# Patient Record
Sex: Female | Born: 1945
Health system: Southern US, Community
[De-identification: ages and names within clinical notes are randomized; demographics above are authoritative.]

## PROBLEM LIST (undated history)

## (undated) DIAGNOSIS — K589 Irritable bowel syndrome without diarrhea: Secondary | ICD-10-CM

## (undated) DIAGNOSIS — E119 Type 2 diabetes mellitus without complications: Secondary | ICD-10-CM

## (undated) DIAGNOSIS — E785 Hyperlipidemia, unspecified: Secondary | ICD-10-CM

## (undated) DIAGNOSIS — F32A Depression, unspecified: Secondary | ICD-10-CM

## (undated) DIAGNOSIS — C55 Malignant neoplasm of uterus, part unspecified: Secondary | ICD-10-CM

## (undated) DIAGNOSIS — D649 Anemia, unspecified: Secondary | ICD-10-CM

## (undated) DIAGNOSIS — J189 Pneumonia, unspecified organism: Secondary | ICD-10-CM

## (undated) DIAGNOSIS — F419 Anxiety disorder, unspecified: Secondary | ICD-10-CM

## (undated) DIAGNOSIS — D569 Thalassemia, unspecified: Secondary | ICD-10-CM

## (undated) DIAGNOSIS — I10 Essential (primary) hypertension: Secondary | ICD-10-CM

## (undated) HISTORY — PX: BREAST EXCISIONAL BIOPSY: SUR124

## (undated) HISTORY — PX: HERNIA REPAIR: SHX51

---

## 1998-11-17 ENCOUNTER — Other Ambulatory Visit: Admission: RE | Admit: 1998-11-17 | Discharge: 1998-11-17 | Payer: Self-pay | Admitting: Obstetrics and Gynecology

## 1999-10-24 ENCOUNTER — Encounter: Admission: RE | Admit: 1999-10-24 | Discharge: 1999-10-24 | Payer: Self-pay | Admitting: Endocrinology

## 1999-10-24 ENCOUNTER — Encounter: Payer: Self-pay | Admitting: Endocrinology

## 1999-10-26 ENCOUNTER — Encounter: Payer: Self-pay | Admitting: Endocrinology

## 1999-10-26 ENCOUNTER — Encounter: Admission: RE | Admit: 1999-10-26 | Discharge: 1999-10-26 | Payer: Self-pay | Admitting: Endocrinology

## 1999-11-19 ENCOUNTER — Other Ambulatory Visit: Admission: RE | Admit: 1999-11-19 | Discharge: 1999-11-19 | Payer: Self-pay | Admitting: Obstetrics and Gynecology

## 1999-11-20 ENCOUNTER — Encounter (INDEPENDENT_AMBULATORY_CARE_PROVIDER_SITE_OTHER): Payer: Self-pay

## 1999-11-20 ENCOUNTER — Other Ambulatory Visit: Admission: RE | Admit: 1999-11-20 | Discharge: 1999-11-20 | Payer: Self-pay | Admitting: Obstetrics and Gynecology

## 2000-11-11 ENCOUNTER — Encounter: Admission: RE | Admit: 2000-11-11 | Discharge: 2000-11-11 | Payer: Self-pay | Admitting: Endocrinology

## 2000-11-11 ENCOUNTER — Encounter: Payer: Self-pay | Admitting: Endocrinology

## 2000-12-11 ENCOUNTER — Other Ambulatory Visit: Admission: RE | Admit: 2000-12-11 | Discharge: 2000-12-11 | Payer: Self-pay | Admitting: Obstetrics and Gynecology

## 2001-11-12 ENCOUNTER — Encounter: Admission: RE | Admit: 2001-11-12 | Discharge: 2001-11-12 | Payer: Self-pay | Admitting: Endocrinology

## 2001-11-12 ENCOUNTER — Encounter: Payer: Self-pay | Admitting: Endocrinology

## 2001-12-22 ENCOUNTER — Other Ambulatory Visit: Admission: RE | Admit: 2001-12-22 | Discharge: 2001-12-22 | Payer: Self-pay | Admitting: Obstetrics and Gynecology

## 2002-11-16 ENCOUNTER — Encounter: Payer: Self-pay | Admitting: Obstetrics and Gynecology

## 2002-11-16 ENCOUNTER — Encounter: Admission: RE | Admit: 2002-11-16 | Discharge: 2002-11-16 | Payer: Self-pay | Admitting: Obstetrics and Gynecology

## 2003-02-08 ENCOUNTER — Other Ambulatory Visit: Admission: RE | Admit: 2003-02-08 | Discharge: 2003-02-08 | Payer: Self-pay | Admitting: Obstetrics and Gynecology

## 2003-03-28 ENCOUNTER — Ambulatory Visit (HOSPITAL_COMMUNITY): Admission: RE | Admit: 2003-03-28 | Discharge: 2003-03-28 | Payer: Self-pay | Admitting: *Deleted

## 2003-11-24 ENCOUNTER — Ambulatory Visit (HOSPITAL_COMMUNITY): Admission: RE | Admit: 2003-11-24 | Discharge: 2003-11-24 | Payer: Self-pay | Admitting: Obstetrics and Gynecology

## 2004-12-04 ENCOUNTER — Ambulatory Visit (HOSPITAL_COMMUNITY): Admission: RE | Admit: 2004-12-04 | Discharge: 2004-12-04 | Payer: Self-pay | Admitting: Obstetrics and Gynecology

## 2005-12-17 ENCOUNTER — Ambulatory Visit (HOSPITAL_COMMUNITY): Admission: RE | Admit: 2005-12-17 | Discharge: 2005-12-17 | Payer: Self-pay | Admitting: Obstetrics and Gynecology

## 2006-12-23 ENCOUNTER — Ambulatory Visit (HOSPITAL_COMMUNITY): Admission: RE | Admit: 2006-12-23 | Discharge: 2006-12-23 | Payer: Self-pay | Admitting: Endocrinology

## 2007-12-29 ENCOUNTER — Ambulatory Visit (HOSPITAL_COMMUNITY): Admission: RE | Admit: 2007-12-29 | Discharge: 2007-12-29 | Payer: Self-pay | Admitting: Obstetrics and Gynecology

## 2009-01-03 ENCOUNTER — Ambulatory Visit (HOSPITAL_COMMUNITY): Admission: RE | Admit: 2009-01-03 | Discharge: 2009-01-03 | Payer: Self-pay | Admitting: Obstetrics and Gynecology

## 2010-02-01 ENCOUNTER — Ambulatory Visit (HOSPITAL_COMMUNITY): Admission: RE | Admit: 2010-02-01 | Discharge: 2010-02-01 | Payer: Self-pay | Admitting: Obstetrics and Gynecology

## 2010-10-05 NOTE — Op Note (Signed)
   NAME:  Teresa Oliver, Teresa Oliver                         ACCOUNT NO.:  [id number]   MEDICAL RECORD NO.:  [id number]                   PATIENT TYPE:  AMB   LOCATION:  ENDO                                 FACILITY:  [facility]   PHYSICIAN:  Georgiana Spinner, M.D.                 DATE OF BIRTH:  [birth year]   DATE OF PROCEDURE:  03/28/2003  DATE OF DISCHARGE:                                 OPERATIVE REPORT   PROCEDURE:  Colonoscopy.   INDICATIONS:  Colon cancer screening.   ANESTHESIA:  Demerol 60 mg, Versed 8 mg.   DESCRIPTION OF PROCEDURE:  With the patient mildly sedated in the left  lateral decubitus position, the Olympus videoscopic colonoscope was inserted  in the rectum and passed under direct vision to the cecum, identified by  ileocecal valve and appendiceal orifice, both of which were photographed.  From this point the colonoscope was slowly withdrawn, taking circumferential  views of the colonic mucosa, stopping only in the rectum, which appeared  normal on direct and showed hemorrhoids on retroflexed view.  The endoscope  was straightened and withdrawn.  The patient's vital signs and pulse  oximetry remained stable.  The patient tolerated the procedure well without  apparent complications.   FINDINGS:  Internal hemorrhoids, otherwise unremarkable examination.   PLAN:  Consider repeat examination in five to 10 years.                                               Georgiana Spinner, M.D.    GMO/MEDQ  D:  03/28/2003  T:  03/28/2003  Job:  [id number]

## 2011-02-11 ENCOUNTER — Other Ambulatory Visit (HOSPITAL_COMMUNITY): Payer: Self-pay | Admitting: Endocrinology

## 2011-02-11 DIAGNOSIS — Z1231 Encounter for screening mammogram for malignant neoplasm of breast: Secondary | ICD-10-CM

## 2011-02-19 ENCOUNTER — Ambulatory Visit (HOSPITAL_COMMUNITY)
Admission: RE | Admit: 2011-02-19 | Discharge: 2011-02-19 | Disposition: A | Discharge status: Home / Self Care | Payer: 59 | Source: Ambulatory Visit | Attending: Endocrinology | Admitting: Endocrinology

## 2011-02-19 DIAGNOSIS — Z1231 Encounter for screening mammogram for malignant neoplasm of breast: Secondary | ICD-10-CM | POA: Insufficient documentation

## 2011-02-21 ENCOUNTER — Other Ambulatory Visit: Payer: Self-pay | Admitting: Endocrinology

## 2011-02-21 DIAGNOSIS — R928 Other abnormal and inconclusive findings on diagnostic imaging of breast: Secondary | ICD-10-CM

## 2011-02-25 ENCOUNTER — Ambulatory Visit
Admission: RE | Admit: 2011-02-25 | Discharge: 2011-02-25 | Disposition: A | Discharge status: Home / Self Care | Payer: 59 | Source: Ambulatory Visit | Attending: Endocrinology | Admitting: Endocrinology

## 2011-02-25 DIAGNOSIS — R928 Other abnormal and inconclusive findings on diagnostic imaging of breast: Secondary | ICD-10-CM

## 2012-04-20 ENCOUNTER — Other Ambulatory Visit: Payer: Self-pay | Admitting: Obstetrics & Gynecology

## 2012-04-20 DIAGNOSIS — Z1231 Encounter for screening mammogram for malignant neoplasm of breast: Secondary | ICD-10-CM

## 2012-05-25 ENCOUNTER — Ambulatory Visit
Admission: RE | Admit: 2012-05-25 | Discharge: 2012-05-25 | Disposition: A | Discharge status: Home / Self Care | Payer: Medicare Other | Source: Ambulatory Visit | Attending: Obstetrics & Gynecology | Admitting: Obstetrics & Gynecology

## 2012-05-25 DIAGNOSIS — Z1231 Encounter for screening mammogram for malignant neoplasm of breast: Secondary | ICD-10-CM

## 2013-07-12 ENCOUNTER — Other Ambulatory Visit: Payer: Self-pay

## 2013-07-12 DIAGNOSIS — Z1231 Encounter for screening mammogram for malignant neoplasm of breast: Secondary | ICD-10-CM

## 2013-07-26 ENCOUNTER — Ambulatory Visit
Admission: RE | Admit: 2013-07-26 | Discharge: 2013-07-26 | Disposition: A | Discharge status: Home / Self Care | Payer: Medicare Other | Source: Ambulatory Visit

## 2013-07-26 DIAGNOSIS — Z1231 Encounter for screening mammogram for malignant neoplasm of breast: Secondary | ICD-10-CM

## 2014-06-08 DIAGNOSIS — E789 Disorder of lipoprotein metabolism, unspecified: Secondary | ICD-10-CM | POA: Diagnosis not present

## 2014-06-08 DIAGNOSIS — Z79899 Other long term (current) drug therapy: Secondary | ICD-10-CM | POA: Diagnosis not present

## 2014-06-08 DIAGNOSIS — I10 Essential (primary) hypertension: Secondary | ICD-10-CM | POA: Diagnosis not present

## 2014-06-15 DIAGNOSIS — I10 Essential (primary) hypertension: Secondary | ICD-10-CM | POA: Diagnosis not present

## 2014-06-15 DIAGNOSIS — D649 Anemia, unspecified: Secondary | ICD-10-CM | POA: Diagnosis not present

## 2014-06-15 DIAGNOSIS — E789 Disorder of lipoprotein metabolism, unspecified: Secondary | ICD-10-CM | POA: Diagnosis not present

## 2014-06-15 DIAGNOSIS — Z23 Encounter for immunization: Secondary | ICD-10-CM | POA: Diagnosis not present

## 2014-07-21 ENCOUNTER — Other Ambulatory Visit: Payer: Self-pay

## 2014-07-21 DIAGNOSIS — Z1231 Encounter for screening mammogram for malignant neoplasm of breast: Secondary | ICD-10-CM

## 2014-08-05 ENCOUNTER — Ambulatory Visit
Admission: RE | Admit: 2014-08-05 | Discharge: 2014-08-05 | Disposition: A | Discharge status: Home / Self Care | Payer: Medicare Other | Source: Ambulatory Visit

## 2014-08-05 DIAGNOSIS — Z1231 Encounter for screening mammogram for malignant neoplasm of breast: Secondary | ICD-10-CM

## 2014-11-01 DIAGNOSIS — H43813 Vitreous degeneration, bilateral: Secondary | ICD-10-CM | POA: Diagnosis not present

## 2014-11-01 DIAGNOSIS — D3131 Benign neoplasm of right choroid: Secondary | ICD-10-CM | POA: Diagnosis not present

## 2014-11-01 DIAGNOSIS — H35373 Puckering of macula, bilateral: Secondary | ICD-10-CM | POA: Diagnosis not present

## 2014-12-12 DIAGNOSIS — E789 Disorder of lipoprotein metabolism, unspecified: Secondary | ICD-10-CM | POA: Diagnosis not present

## 2014-12-12 DIAGNOSIS — I10 Essential (primary) hypertension: Secondary | ICD-10-CM | POA: Diagnosis not present

## 2014-12-19 DIAGNOSIS — D649 Anemia, unspecified: Secondary | ICD-10-CM | POA: Diagnosis not present

## 2014-12-19 DIAGNOSIS — I10 Essential (primary) hypertension: Secondary | ICD-10-CM | POA: Diagnosis not present

## 2014-12-19 DIAGNOSIS — E789 Disorder of lipoprotein metabolism, unspecified: Secondary | ICD-10-CM | POA: Diagnosis not present

## 2015-03-14 DIAGNOSIS — Z23 Encounter for immunization: Secondary | ICD-10-CM | POA: Diagnosis not present

## 2015-05-10 DIAGNOSIS — H35373 Puckering of macula, bilateral: Secondary | ICD-10-CM | POA: Diagnosis not present

## 2015-05-10 DIAGNOSIS — H43813 Vitreous degeneration, bilateral: Secondary | ICD-10-CM | POA: Diagnosis not present

## 2015-05-10 DIAGNOSIS — D3131 Benign neoplasm of right choroid: Secondary | ICD-10-CM | POA: Diagnosis not present

## 2015-06-12 DIAGNOSIS — I10 Essential (primary) hypertension: Secondary | ICD-10-CM | POA: Diagnosis not present

## 2015-06-12 DIAGNOSIS — E789 Disorder of lipoprotein metabolism, unspecified: Secondary | ICD-10-CM | POA: Diagnosis not present

## 2015-06-20 DIAGNOSIS — E789 Disorder of lipoprotein metabolism, unspecified: Secondary | ICD-10-CM | POA: Diagnosis not present

## 2015-06-20 DIAGNOSIS — I10 Essential (primary) hypertension: Secondary | ICD-10-CM | POA: Diagnosis not present

## 2015-06-20 DIAGNOSIS — D649 Anemia, unspecified: Secondary | ICD-10-CM | POA: Diagnosis not present

## 2015-08-16 ENCOUNTER — Other Ambulatory Visit: Payer: Self-pay

## 2015-08-16 DIAGNOSIS — Z1231 Encounter for screening mammogram for malignant neoplasm of breast: Secondary | ICD-10-CM

## 2015-08-29 ENCOUNTER — Ambulatory Visit
Admission: RE | Admit: 2015-08-29 | Discharge: 2015-08-29 | Disposition: A | Discharge status: Home / Self Care | Payer: Medicare Other | Source: Ambulatory Visit

## 2015-08-29 DIAGNOSIS — Z1231 Encounter for screening mammogram for malignant neoplasm of breast: Secondary | ICD-10-CM | POA: Diagnosis not present

## 2015-11-01 DIAGNOSIS — Z01419 Encounter for gynecological examination (general) (routine) without abnormal findings: Secondary | ICD-10-CM | POA: Diagnosis not present

## 2016-05-07 DIAGNOSIS — Z23 Encounter for immunization: Secondary | ICD-10-CM | POA: Diagnosis not present

## 2016-05-08 DIAGNOSIS — H35363 Drusen (degenerative) of macula, bilateral: Secondary | ICD-10-CM | POA: Diagnosis not present

## 2016-05-08 DIAGNOSIS — H35373 Puckering of macula, bilateral: Secondary | ICD-10-CM | POA: Diagnosis not present

## 2016-05-08 DIAGNOSIS — H43813 Vitreous degeneration, bilateral: Secondary | ICD-10-CM | POA: Diagnosis not present

## 2016-05-08 DIAGNOSIS — D3131 Benign neoplasm of right choroid: Secondary | ICD-10-CM | POA: Diagnosis not present

## 2016-06-11 DIAGNOSIS — I10 Essential (primary) hypertension: Secondary | ICD-10-CM | POA: Diagnosis not present

## 2016-06-11 DIAGNOSIS — E789 Disorder of lipoprotein metabolism, unspecified: Secondary | ICD-10-CM | POA: Diagnosis not present

## 2016-06-18 DIAGNOSIS — G4700 Insomnia, unspecified: Secondary | ICD-10-CM | POA: Diagnosis not present

## 2016-06-18 DIAGNOSIS — E032 Hypothyroidism due to medicaments and other exogenous substances: Secondary | ICD-10-CM | POA: Diagnosis not present

## 2016-06-18 DIAGNOSIS — E789 Disorder of lipoprotein metabolism, unspecified: Secondary | ICD-10-CM | POA: Diagnosis not present

## 2016-06-18 DIAGNOSIS — Z23 Encounter for immunization: Secondary | ICD-10-CM | POA: Diagnosis not present

## 2016-07-03 DIAGNOSIS — Z1211 Encounter for screening for malignant neoplasm of colon: Secondary | ICD-10-CM | POA: Diagnosis not present

## 2016-07-03 DIAGNOSIS — Z1212 Encounter for screening for malignant neoplasm of rectum: Secondary | ICD-10-CM | POA: Diagnosis not present

## 2016-10-10 ENCOUNTER — Other Ambulatory Visit: Payer: Self-pay | Admitting: Endocrinology

## 2016-10-10 DIAGNOSIS — Z1231 Encounter for screening mammogram for malignant neoplasm of breast: Secondary | ICD-10-CM

## 2016-10-24 ENCOUNTER — Ambulatory Visit
Admission: RE | Admit: 2016-10-24 | Discharge: 2016-10-24 | Disposition: A | Discharge status: Home / Self Care | Payer: Medicare Other | Source: Ambulatory Visit | Attending: Endocrinology | Admitting: Endocrinology

## 2016-10-24 DIAGNOSIS — Z1231 Encounter for screening mammogram for malignant neoplasm of breast: Secondary | ICD-10-CM

## 2016-12-11 DIAGNOSIS — Z01419 Encounter for gynecological examination (general) (routine) without abnormal findings: Secondary | ICD-10-CM | POA: Diagnosis not present

## 2017-01-17 DIAGNOSIS — E785 Hyperlipidemia, unspecified: Secondary | ICD-10-CM | POA: Diagnosis not present

## 2017-01-17 DIAGNOSIS — I10 Essential (primary) hypertension: Secondary | ICD-10-CM | POA: Diagnosis not present

## 2017-03-11 DIAGNOSIS — Z23 Encounter for immunization: Secondary | ICD-10-CM | POA: Diagnosis not present

## 2017-06-13 DIAGNOSIS — H35373 Puckering of macula, bilateral: Secondary | ICD-10-CM | POA: Diagnosis not present

## 2017-06-13 DIAGNOSIS — H35363 Drusen (degenerative) of macula, bilateral: Secondary | ICD-10-CM | POA: Diagnosis not present

## 2017-06-13 DIAGNOSIS — H43813 Vitreous degeneration, bilateral: Secondary | ICD-10-CM | POA: Diagnosis not present

## 2017-06-13 DIAGNOSIS — D3131 Benign neoplasm of right choroid: Secondary | ICD-10-CM | POA: Diagnosis not present

## 2017-06-23 DIAGNOSIS — E559 Vitamin D deficiency, unspecified: Secondary | ICD-10-CM | POA: Diagnosis not present

## 2017-06-23 DIAGNOSIS — Z0000 Encounter for general adult medical examination without abnormal findings: Secondary | ICD-10-CM | POA: Diagnosis not present

## 2017-06-23 DIAGNOSIS — Z79899 Other long term (current) drug therapy: Secondary | ICD-10-CM | POA: Diagnosis not present

## 2017-06-30 DIAGNOSIS — I10 Essential (primary) hypertension: Secondary | ICD-10-CM | POA: Diagnosis not present

## 2017-06-30 DIAGNOSIS — E559 Vitamin D deficiency, unspecified: Secondary | ICD-10-CM | POA: Diagnosis not present

## 2017-06-30 DIAGNOSIS — Z23 Encounter for immunization: Secondary | ICD-10-CM | POA: Diagnosis not present

## 2017-06-30 DIAGNOSIS — Z0000 Encounter for general adult medical examination without abnormal findings: Secondary | ICD-10-CM | POA: Diagnosis not present

## 2017-06-30 DIAGNOSIS — R3129 Other microscopic hematuria: Secondary | ICD-10-CM | POA: Diagnosis not present

## 2017-07-21 DIAGNOSIS — R3121 Asymptomatic microscopic hematuria: Secondary | ICD-10-CM | POA: Diagnosis not present

## 2017-07-29 DIAGNOSIS — H2513 Age-related nuclear cataract, bilateral: Secondary | ICD-10-CM | POA: Diagnosis not present

## 2017-07-29 DIAGNOSIS — H52203 Unspecified astigmatism, bilateral: Secondary | ICD-10-CM | POA: Diagnosis not present

## 2017-07-29 DIAGNOSIS — H04123 Dry eye syndrome of bilateral lacrimal glands: Secondary | ICD-10-CM | POA: Diagnosis not present

## 2017-10-22 ENCOUNTER — Other Ambulatory Visit: Payer: Self-pay | Admitting: Internal Medicine

## 2017-10-22 DIAGNOSIS — Z1231 Encounter for screening mammogram for malignant neoplasm of breast: Secondary | ICD-10-CM

## 2017-11-11 ENCOUNTER — Ambulatory Visit
Admission: RE | Admit: 2017-11-11 | Discharge: 2017-11-11 | Disposition: A | Discharge status: Home / Self Care | Payer: Medicare Other | Source: Ambulatory Visit | Attending: Internal Medicine | Admitting: Internal Medicine

## 2017-11-11 DIAGNOSIS — Z1231 Encounter for screening mammogram for malignant neoplasm of breast: Secondary | ICD-10-CM

## 2017-12-22 DIAGNOSIS — I10 Essential (primary) hypertension: Secondary | ICD-10-CM | POA: Diagnosis not present

## 2017-12-22 DIAGNOSIS — E559 Vitamin D deficiency, unspecified: Secondary | ICD-10-CM | POA: Diagnosis not present

## 2018-01-07 DIAGNOSIS — I10 Essential (primary) hypertension: Secondary | ICD-10-CM | POA: Diagnosis not present

## 2018-01-07 DIAGNOSIS — E785 Hyperlipidemia, unspecified: Secondary | ICD-10-CM | POA: Diagnosis not present

## 2018-01-07 DIAGNOSIS — E559 Vitamin D deficiency, unspecified: Secondary | ICD-10-CM | POA: Diagnosis not present

## 2018-06-12 DIAGNOSIS — H35363 Drusen (degenerative) of macula, bilateral: Secondary | ICD-10-CM | POA: Diagnosis not present

## 2018-06-12 DIAGNOSIS — D3131 Benign neoplasm of right choroid: Secondary | ICD-10-CM | POA: Diagnosis not present

## 2018-06-12 DIAGNOSIS — H35373 Puckering of macula, bilateral: Secondary | ICD-10-CM | POA: Diagnosis not present

## 2018-06-12 DIAGNOSIS — H43813 Vitreous degeneration, bilateral: Secondary | ICD-10-CM | POA: Diagnosis not present

## 2018-06-25 DIAGNOSIS — E789 Disorder of lipoprotein metabolism, unspecified: Secondary | ICD-10-CM | POA: Diagnosis not present

## 2018-06-25 DIAGNOSIS — I10 Essential (primary) hypertension: Secondary | ICD-10-CM | POA: Diagnosis not present

## 2018-06-25 DIAGNOSIS — E559 Vitamin D deficiency, unspecified: Secondary | ICD-10-CM | POA: Diagnosis not present

## 2018-07-02 DIAGNOSIS — R3129 Other microscopic hematuria: Secondary | ICD-10-CM | POA: Diagnosis not present

## 2018-07-02 DIAGNOSIS — Z0000 Encounter for general adult medical examination without abnormal findings: Secondary | ICD-10-CM | POA: Diagnosis not present

## 2018-07-02 DIAGNOSIS — I10 Essential (primary) hypertension: Secondary | ICD-10-CM | POA: Diagnosis not present

## 2018-07-02 DIAGNOSIS — E785 Hyperlipidemia, unspecified: Secondary | ICD-10-CM | POA: Diagnosis not present

## 2018-07-31 DIAGNOSIS — H2513 Age-related nuclear cataract, bilateral: Secondary | ICD-10-CM | POA: Diagnosis not present

## 2018-07-31 DIAGNOSIS — H524 Presbyopia: Secondary | ICD-10-CM | POA: Diagnosis not present

## 2018-07-31 DIAGNOSIS — H25013 Cortical age-related cataract, bilateral: Secondary | ICD-10-CM | POA: Diagnosis not present

## 2018-12-11 ENCOUNTER — Other Ambulatory Visit: Payer: Self-pay | Admitting: Internal Medicine

## 2018-12-11 DIAGNOSIS — Z1231 Encounter for screening mammogram for malignant neoplasm of breast: Secondary | ICD-10-CM

## 2018-12-24 DIAGNOSIS — E789 Disorder of lipoprotein metabolism, unspecified: Secondary | ICD-10-CM | POA: Diagnosis not present

## 2018-12-24 DIAGNOSIS — E785 Hyperlipidemia, unspecified: Secondary | ICD-10-CM | POA: Diagnosis not present

## 2018-12-24 DIAGNOSIS — I10 Essential (primary) hypertension: Secondary | ICD-10-CM | POA: Diagnosis not present

## 2018-12-31 DIAGNOSIS — I10 Essential (primary) hypertension: Secondary | ICD-10-CM | POA: Diagnosis not present

## 2018-12-31 DIAGNOSIS — E559 Vitamin D deficiency, unspecified: Secondary | ICD-10-CM | POA: Diagnosis not present

## 2018-12-31 DIAGNOSIS — E785 Hyperlipidemia, unspecified: Secondary | ICD-10-CM | POA: Diagnosis not present

## 2018-12-31 DIAGNOSIS — Z7189 Other specified counseling: Secondary | ICD-10-CM | POA: Diagnosis not present

## 2019-01-27 ENCOUNTER — Other Ambulatory Visit: Payer: Self-pay

## 2019-01-27 ENCOUNTER — Ambulatory Visit
Admission: RE | Admit: 2019-01-27 | Discharge: 2019-01-27 | Disposition: A | Discharge status: Home / Self Care | Payer: Medicare Other | Source: Ambulatory Visit | Attending: Internal Medicine | Admitting: Internal Medicine

## 2019-01-27 DIAGNOSIS — Z1231 Encounter for screening mammogram for malignant neoplasm of breast: Secondary | ICD-10-CM | POA: Diagnosis not present

## 2019-01-27 IMAGING — MG MM DIGITAL SCREENING BILAT W/ TOMO W/ CAD
6 of 10 series · 6 of 30 positions shown · non-contrast
Comparison: Previous exam(s).

CLINICAL DATA: Screening.

EXAM:
DIGITAL SCREENING BILATERAL MAMMOGRAM WITH TOMO AND CAD

[L CC synth-2D]
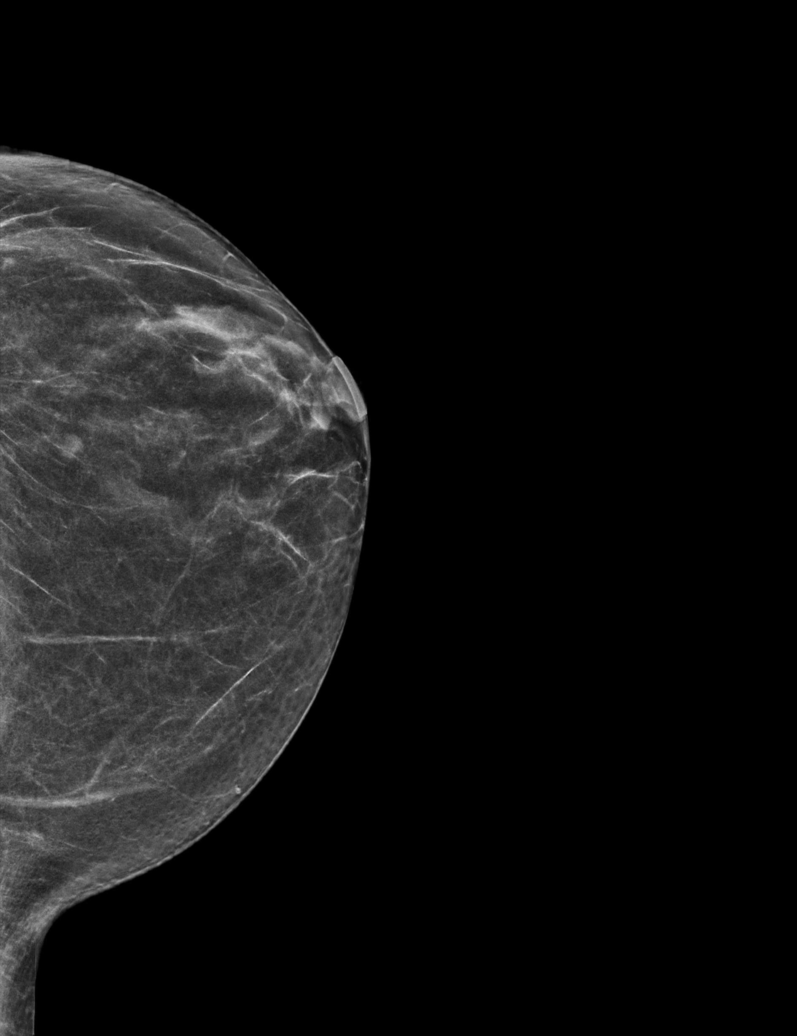

[L MLO synth-2D (1 of 2)]
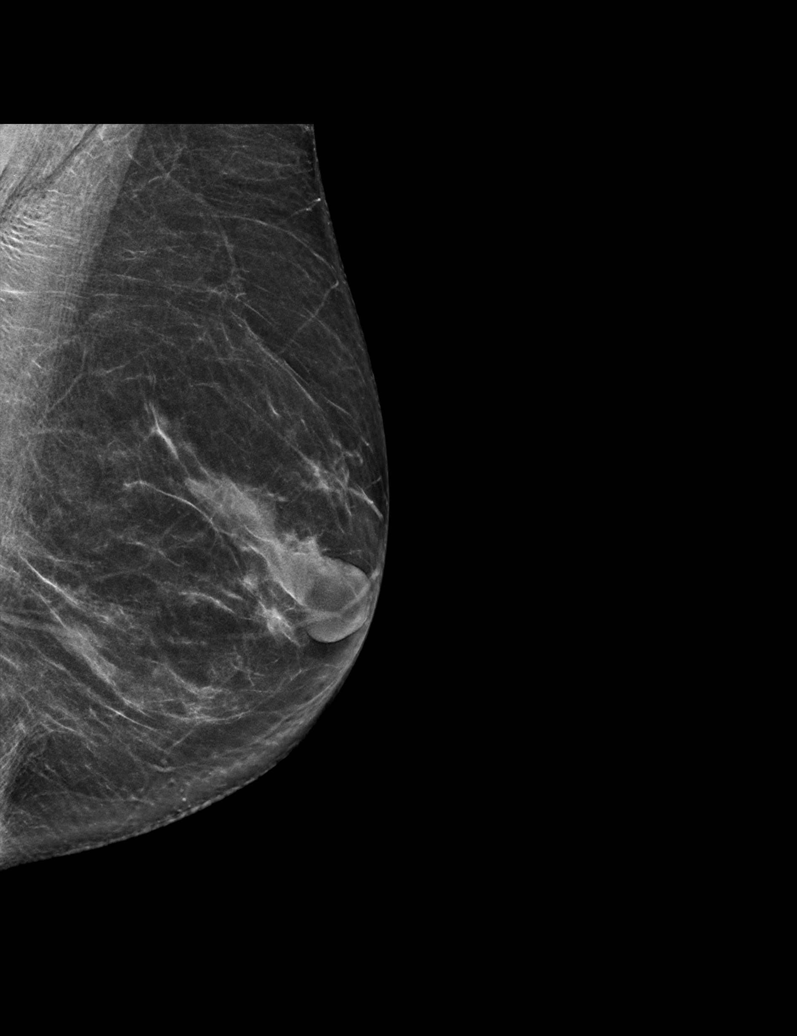

[R MLO synth-2D]
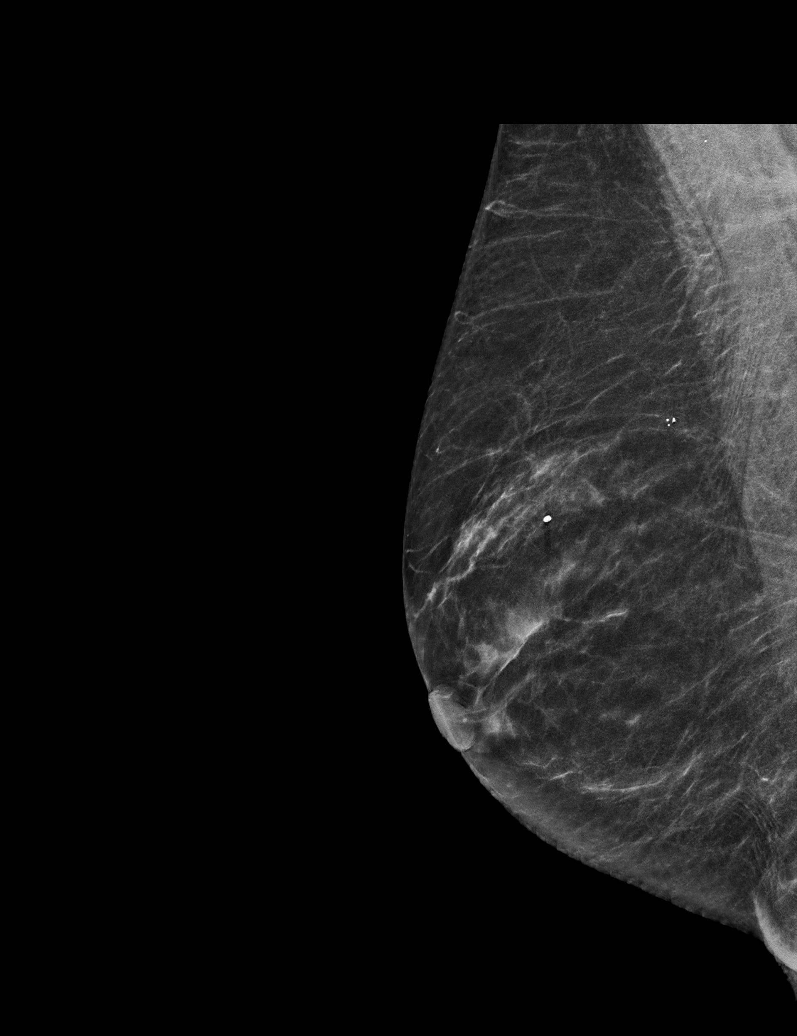

[R CC synth-2D]
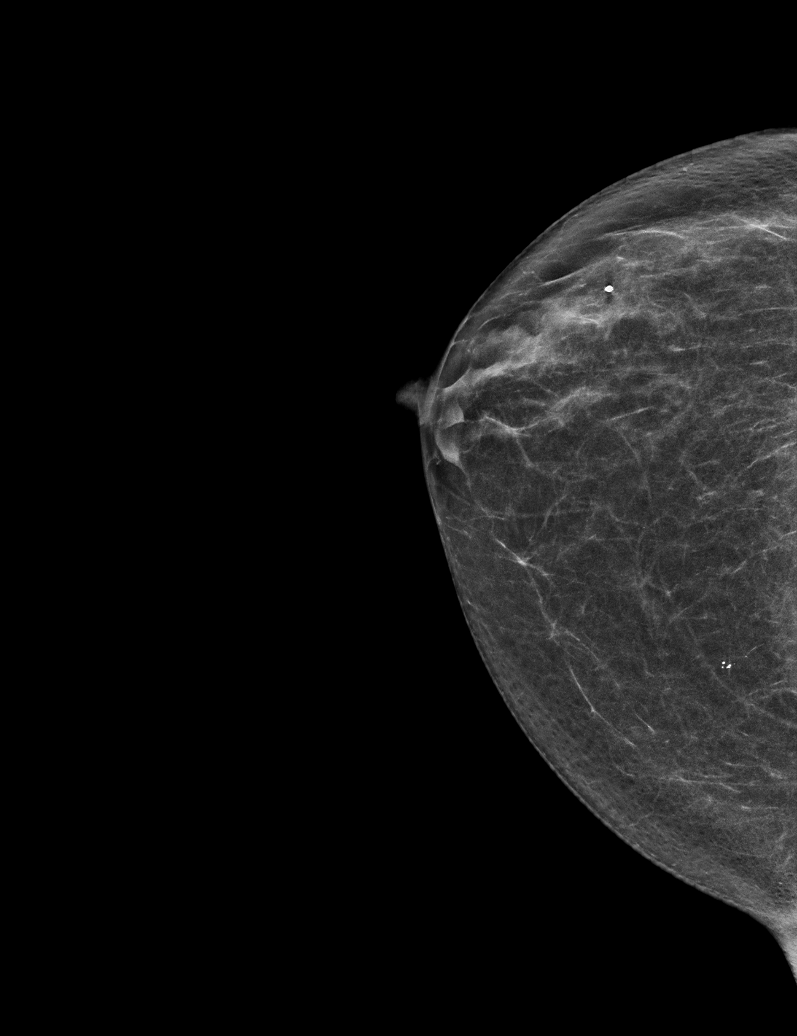

[L MLO synth-2D (2 of 2)]
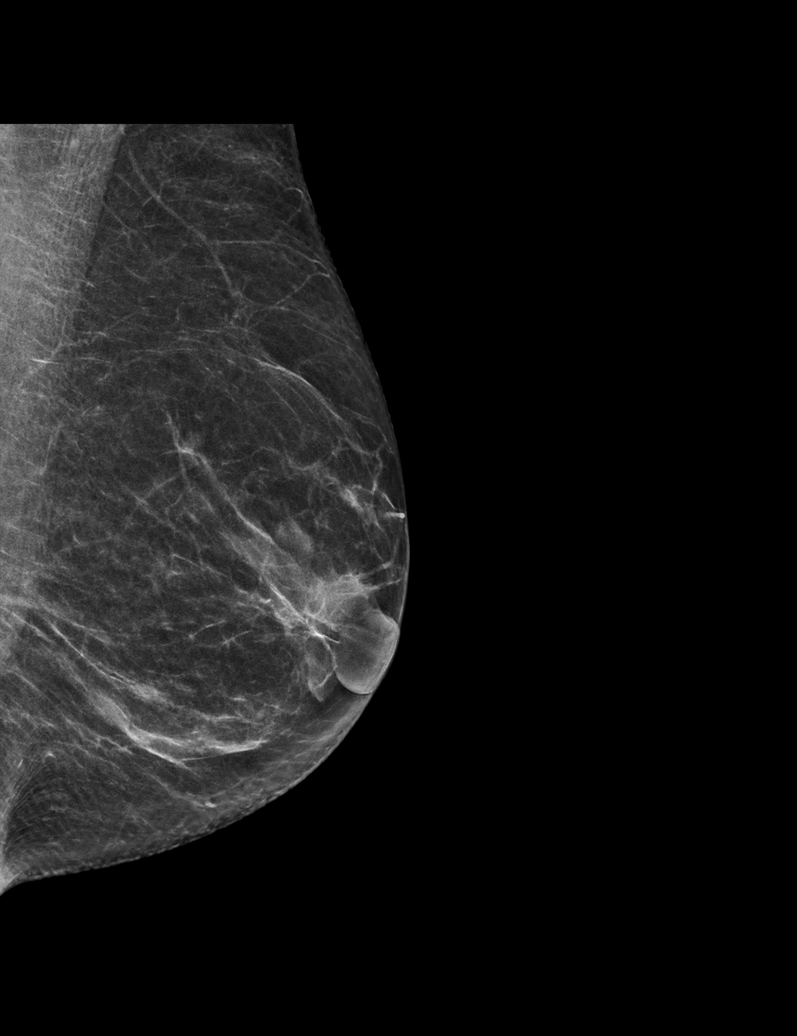

[L CC tomo · tomo slice 29/58.0]
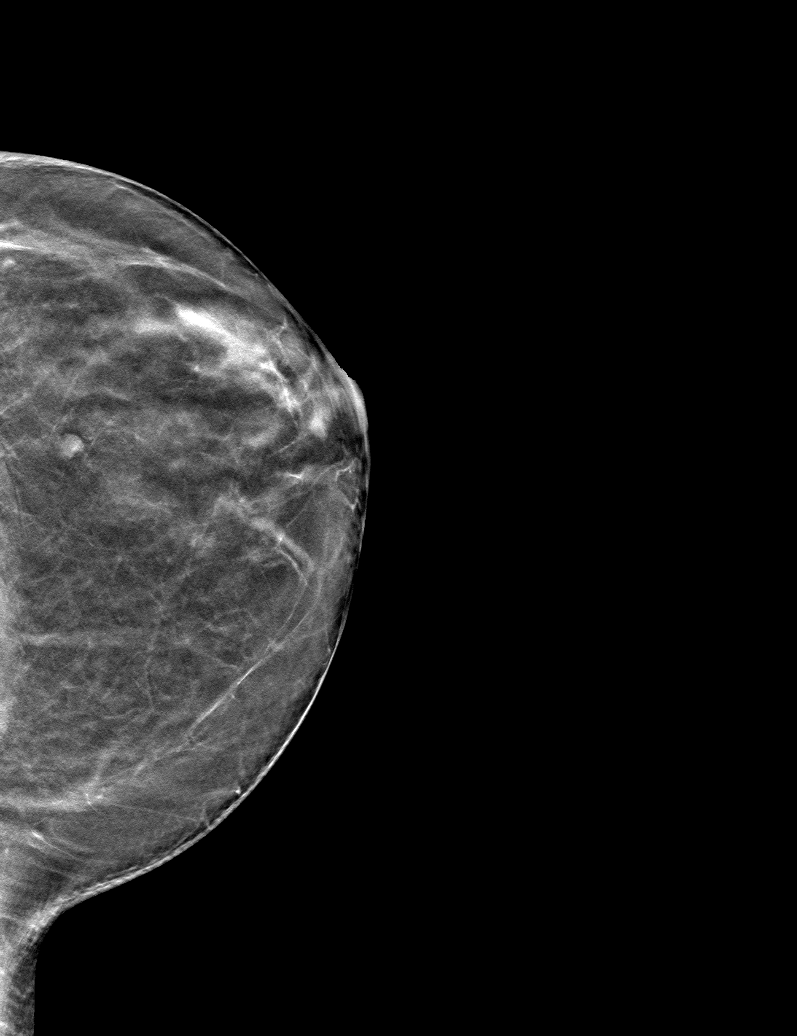

[6 of 30 positions shown; findings below may reference images not displayed]

ACR Breast Density Category b: There are scattered areas of
fibroglandular density.
FINDINGS: There are no findings suspicious for malignancy. Images were
processed with CAD.
IMPRESSION: No mammographic evidence of malignancy. A result letter of this
screening mammogram will be mailed directly to the patient.

RECOMMENDATION:
Screening mammogram in one year. (Code:[TQ])

BI-RADS CATEGORY  1: Negative.

## 2019-04-27 DIAGNOSIS — I10 Essential (primary) hypertension: Secondary | ICD-10-CM | POA: Diagnosis not present

## 2019-06-18 DIAGNOSIS — H35373 Puckering of macula, bilateral: Secondary | ICD-10-CM | POA: Diagnosis not present

## 2019-06-18 DIAGNOSIS — H43813 Vitreous degeneration, bilateral: Secondary | ICD-10-CM | POA: Diagnosis not present

## 2019-06-18 DIAGNOSIS — D3131 Benign neoplasm of right choroid: Secondary | ICD-10-CM | POA: Diagnosis not present

## 2019-06-18 DIAGNOSIS — H35363 Drusen (degenerative) of macula, bilateral: Secondary | ICD-10-CM | POA: Diagnosis not present

## 2019-06-19 ENCOUNTER — Ambulatory Visit: Payer: Medicare Other

## 2019-06-24 DIAGNOSIS — E559 Vitamin D deficiency, unspecified: Secondary | ICD-10-CM | POA: Diagnosis not present

## 2019-06-24 DIAGNOSIS — I10 Essential (primary) hypertension: Secondary | ICD-10-CM | POA: Diagnosis not present

## 2019-06-24 DIAGNOSIS — E785 Hyperlipidemia, unspecified: Secondary | ICD-10-CM | POA: Diagnosis not present

## 2019-06-25 ENCOUNTER — Ambulatory Visit: Payer: Medicare Other | Attending: Internal Medicine

## 2019-06-25 DIAGNOSIS — Z23 Encounter for immunization: Secondary | ICD-10-CM | POA: Insufficient documentation

## 2019-06-25 NOTE — Progress Notes (Signed)
   Covid-19 Vaccination Clinic  Name:  Teresa Oliver    MRN: [id number] DOB: 1945/06/13  06/25/2019  Teresa Oliver was observed post Covid-19 immunization for 15 minutes without incidence. She was provided with Vaccine Information Sheet and instruction to access the V-Safe system.   Teresa Oliver was instructed to call 911 with any severe reactions post vaccine: Marland Kitchen Difficulty breathing  . Swelling of your face and throat  . A fast heartbeat  . A bad rash all over your body  . Dizziness and weakness    Immunizations Administered    Name Date Dose VIS Date Route   Pfizer COVID-19 Vaccine 06/25/2019  8:47 AM 0.3 mL 04/30/2019 Intramuscular   Manufacturer: [place]   Lot: [id number]   [place]: [id number]

## 2019-06-30 ENCOUNTER — Ambulatory Visit: Payer: Medicare Other

## 2019-07-02 DIAGNOSIS — R3129 Other microscopic hematuria: Secondary | ICD-10-CM | POA: Diagnosis not present

## 2019-07-02 DIAGNOSIS — E785 Hyperlipidemia, unspecified: Secondary | ICD-10-CM | POA: Diagnosis not present

## 2019-07-02 DIAGNOSIS — I10 Essential (primary) hypertension: Secondary | ICD-10-CM | POA: Diagnosis not present

## 2019-07-19 ENCOUNTER — Ambulatory Visit: Payer: Medicare Other | Attending: Internal Medicine

## 2019-07-19 DIAGNOSIS — Z23 Encounter for immunization: Secondary | ICD-10-CM | POA: Insufficient documentation

## 2019-07-19 NOTE — Progress Notes (Signed)
   Covid-19 Vaccination Clinic  Name:  Teresa Oliver    MRN: [id number] DOB: 22-Sep-1945  07/19/2019  [name] was observed post Covid-19 immunization for 15 minutes without incidence. She was provided with Vaccine Information Sheet and instruction to access the V-Safe system.   [name] was instructed to call 911 with any severe reactions post vaccine: Marland Kitchen Difficulty breathing  . Swelling of your face and throat  . A fast heartbeat  . A bad rash all over your body  . Dizziness and weakness    Immunizations Administered    Name Date Dose VIS Date Route   Pfizer COVID-19 Vaccine 07/19/2019  5:07 PM 0.3 mL 04/30/2019 Intramuscular   Manufacturer: [place]   Lot: [id number]   [place]: [id number]

## 2019-07-20 ENCOUNTER — Ambulatory Visit: Payer: Medicare Other

## 2019-08-03 DIAGNOSIS — H43813 Vitreous degeneration, bilateral: Secondary | ICD-10-CM | POA: Diagnosis not present

## 2019-08-03 DIAGNOSIS — H524 Presbyopia: Secondary | ICD-10-CM | POA: Diagnosis not present

## 2019-08-03 DIAGNOSIS — H2512 Age-related nuclear cataract, left eye: Secondary | ICD-10-CM | POA: Diagnosis not present

## 2019-08-03 DIAGNOSIS — H25013 Cortical age-related cataract, bilateral: Secondary | ICD-10-CM | POA: Diagnosis not present

## 2019-09-30 DIAGNOSIS — H2512 Age-related nuclear cataract, left eye: Secondary | ICD-10-CM | POA: Diagnosis not present

## 2019-09-30 DIAGNOSIS — H25012 Cortical age-related cataract, left eye: Secondary | ICD-10-CM | POA: Diagnosis not present

## 2019-09-30 DIAGNOSIS — H25812 Combined forms of age-related cataract, left eye: Secondary | ICD-10-CM | POA: Diagnosis not present

## 2019-10-14 DIAGNOSIS — H2511 Age-related nuclear cataract, right eye: Secondary | ICD-10-CM | POA: Diagnosis not present

## 2019-10-14 DIAGNOSIS — H25811 Combined forms of age-related cataract, right eye: Secondary | ICD-10-CM | POA: Diagnosis not present

## 2019-10-14 DIAGNOSIS — H25011 Cortical age-related cataract, right eye: Secondary | ICD-10-CM | POA: Diagnosis not present

## 2019-12-23 DIAGNOSIS — E559 Vitamin D deficiency, unspecified: Secondary | ICD-10-CM | POA: Diagnosis not present

## 2019-12-23 DIAGNOSIS — E785 Hyperlipidemia, unspecified: Secondary | ICD-10-CM | POA: Diagnosis not present

## 2019-12-23 DIAGNOSIS — I10 Essential (primary) hypertension: Secondary | ICD-10-CM | POA: Diagnosis not present

## 2019-12-30 DIAGNOSIS — E785 Hyperlipidemia, unspecified: Secondary | ICD-10-CM | POA: Diagnosis not present

## 2019-12-30 DIAGNOSIS — K5900 Constipation, unspecified: Secondary | ICD-10-CM | POA: Diagnosis not present

## 2019-12-30 DIAGNOSIS — E559 Vitamin D deficiency, unspecified: Secondary | ICD-10-CM | POA: Diagnosis not present

## 2019-12-30 DIAGNOSIS — I10 Essential (primary) hypertension: Secondary | ICD-10-CM | POA: Diagnosis not present

## 2020-03-14 ENCOUNTER — Other Ambulatory Visit: Payer: Self-pay | Admitting: Internal Medicine

## 2020-03-14 DIAGNOSIS — Z1231 Encounter for screening mammogram for malignant neoplasm of breast: Secondary | ICD-10-CM

## 2020-04-20 ENCOUNTER — Other Ambulatory Visit: Payer: Self-pay

## 2020-04-20 ENCOUNTER — Ambulatory Visit
Admission: RE | Admit: 2020-04-20 | Discharge: 2020-04-20 | Disposition: A | Discharge status: Home / Self Care | Payer: Medicare Other | Source: Ambulatory Visit | Attending: Internal Medicine | Admitting: Internal Medicine

## 2020-04-20 DIAGNOSIS — Z1231 Encounter for screening mammogram for malignant neoplasm of breast: Secondary | ICD-10-CM

## 2020-04-20 IMAGING — MG DIGITAL SCREENING BILAT W/ TOMO W/ CAD
6 of 12 series · 6 of 36 positions shown · non-contrast
Comparison: Previous exam(s).

CLINICAL DATA: Screening.

EXAM:
DIGITAL SCREENING BILATERAL MAMMOGRAM WITH TOMO AND CAD

[R CC synth-2D]
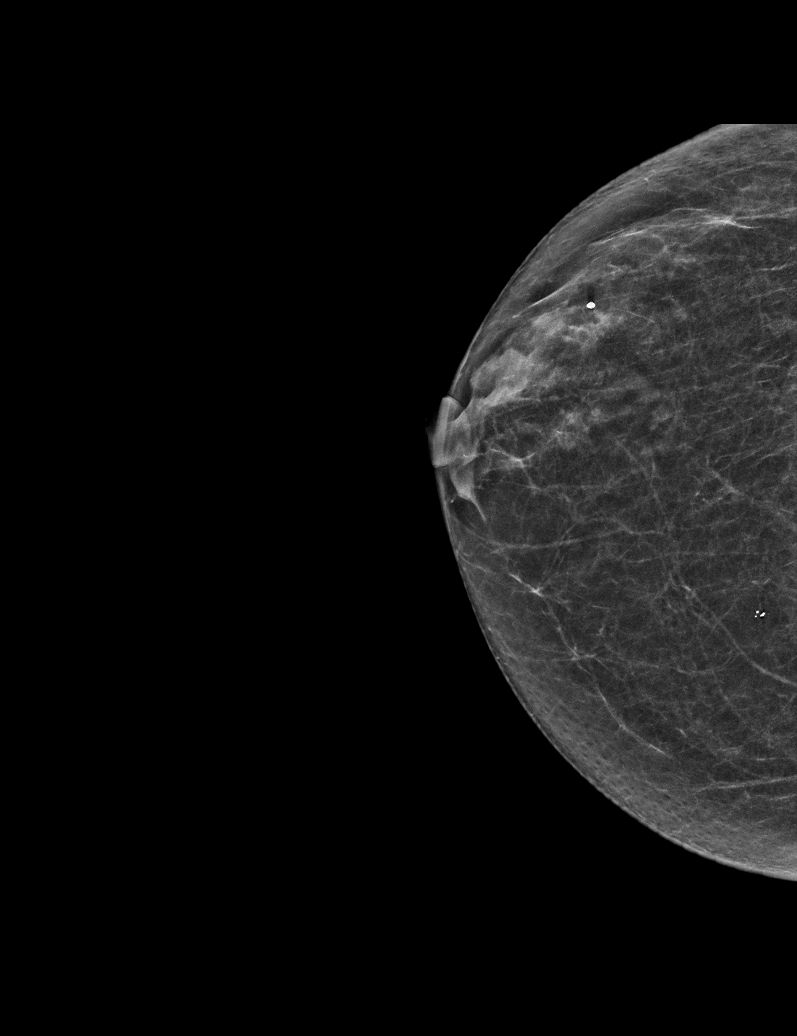

[L MLO synth-2D (1 of 2)]
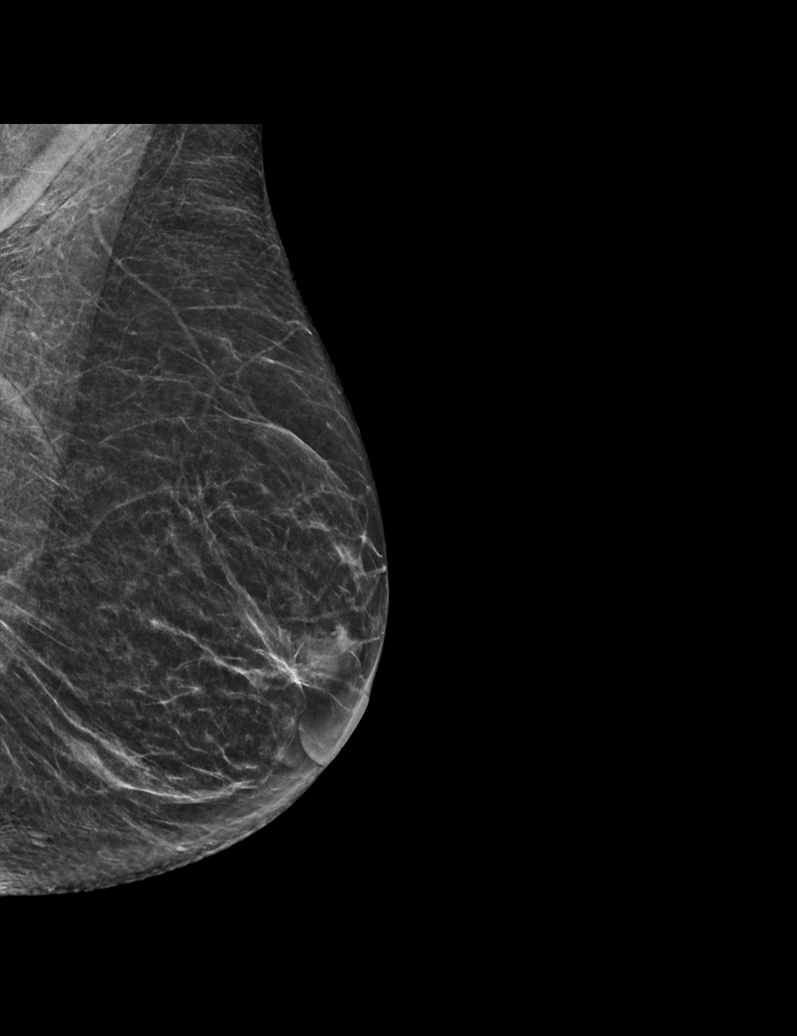

[R MLO synth-2D]
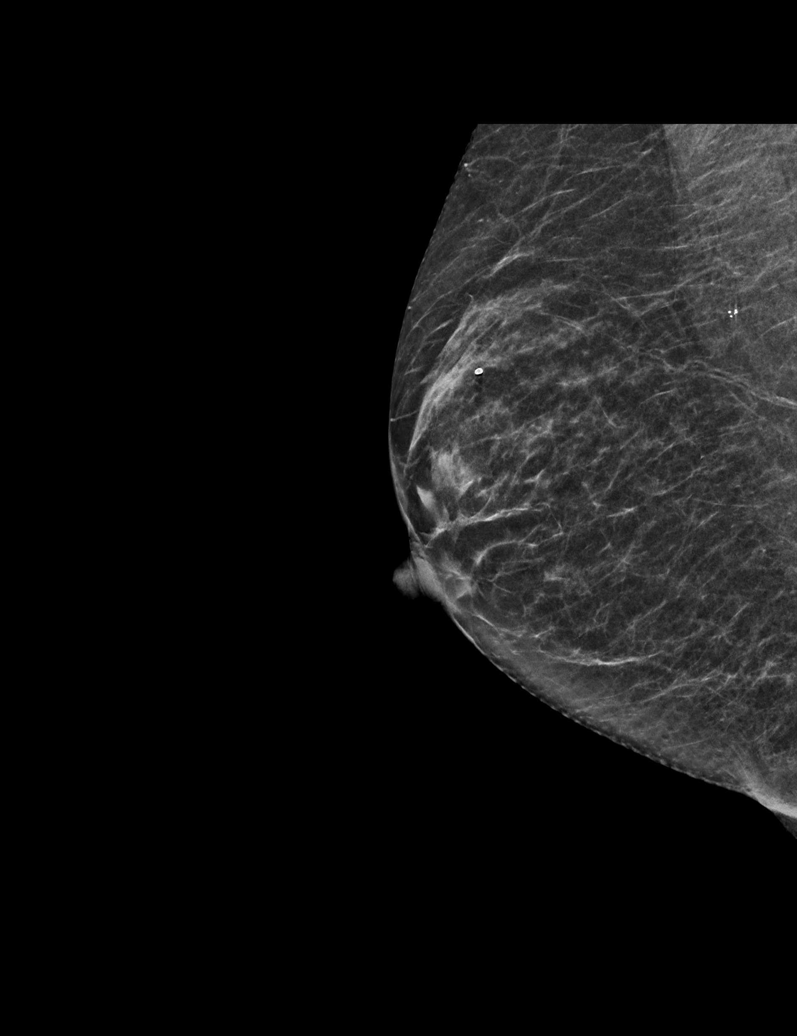

[L CC synth-2D (1 of 2)]
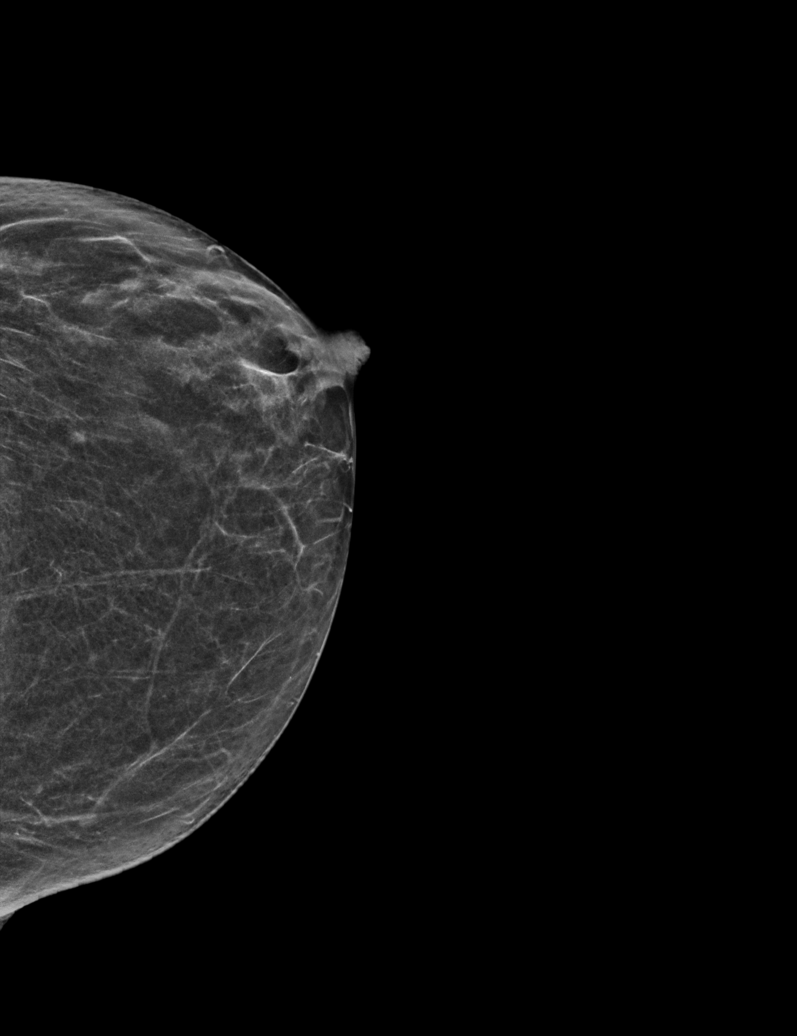

[L MLO synth-2D (2 of 2)]
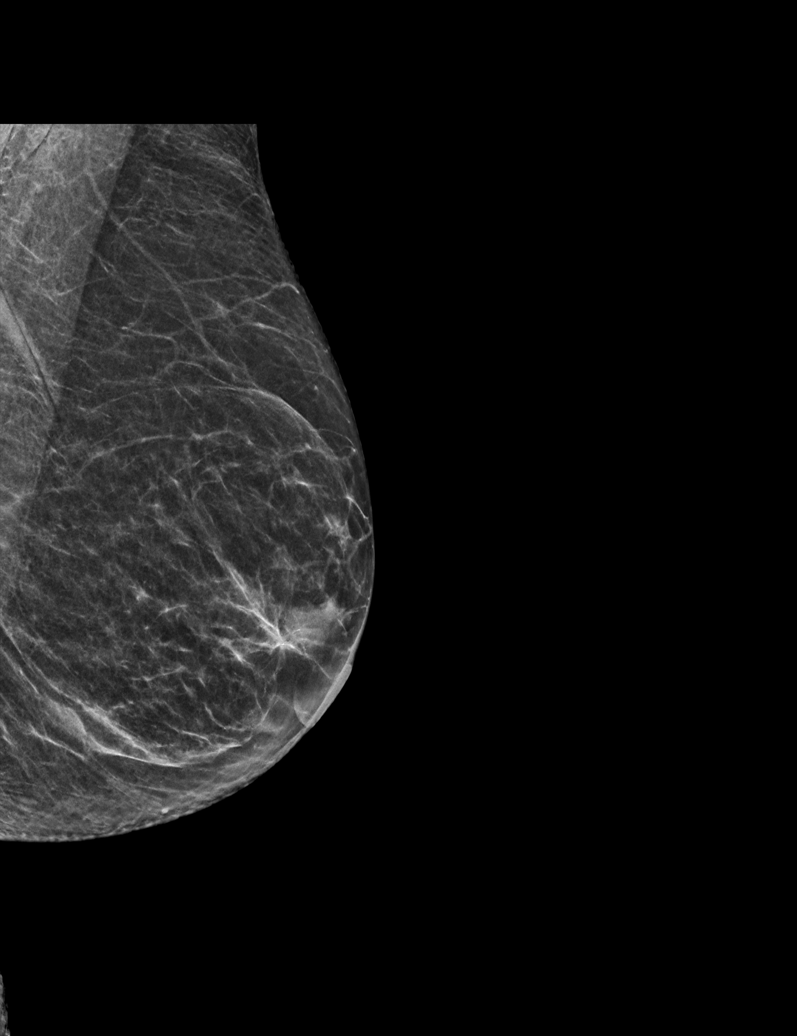

[L CC synth-2D (2 of 2)]
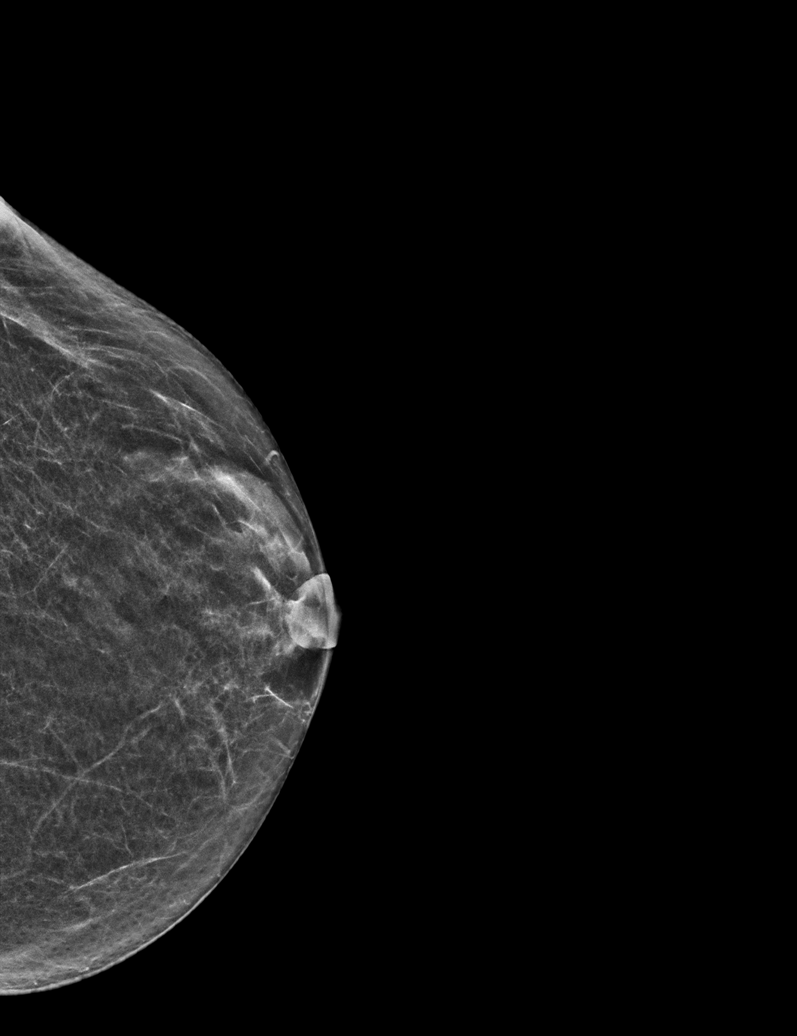

[6 of 36 positions shown; findings below may reference images not displayed]

ACR Breast Density Category b: There are scattered areas of
fibroglandular density.
FINDINGS: There are no findings suspicious for malignancy. Images were
processed with CAD.
IMPRESSION: No mammographic evidence of malignancy. A result letter of this
screening mammogram will be mailed directly to the patient.

RECOMMENDATION:
Screening mammogram in one year. (Code:[TQ])

BI-RADS CATEGORY  1: Negative.

## 2020-06-16 DIAGNOSIS — H43813 Vitreous degeneration, bilateral: Secondary | ICD-10-CM | POA: Diagnosis not present

## 2020-06-16 DIAGNOSIS — H35363 Drusen (degenerative) of macula, bilateral: Secondary | ICD-10-CM | POA: Diagnosis not present

## 2020-06-16 DIAGNOSIS — H35373 Puckering of macula, bilateral: Secondary | ICD-10-CM | POA: Diagnosis not present

## 2020-06-16 DIAGNOSIS — D3131 Benign neoplasm of right choroid: Secondary | ICD-10-CM | POA: Diagnosis not present

## 2020-06-26 DIAGNOSIS — E785 Hyperlipidemia, unspecified: Secondary | ICD-10-CM | POA: Diagnosis not present

## 2020-06-26 DIAGNOSIS — I10 Essential (primary) hypertension: Secondary | ICD-10-CM | POA: Diagnosis not present

## 2020-06-26 DIAGNOSIS — R7309 Other abnormal glucose: Secondary | ICD-10-CM | POA: Diagnosis not present

## 2020-06-26 DIAGNOSIS — R7989 Other specified abnormal findings of blood chemistry: Secondary | ICD-10-CM | POA: Diagnosis not present

## 2020-06-26 DIAGNOSIS — E559 Vitamin D deficiency, unspecified: Secondary | ICD-10-CM | POA: Diagnosis not present

## 2020-06-26 DIAGNOSIS — Z79899 Other long term (current) drug therapy: Secondary | ICD-10-CM | POA: Diagnosis not present

## 2020-07-03 DIAGNOSIS — I10 Essential (primary) hypertension: Secondary | ICD-10-CM | POA: Diagnosis not present

## 2020-07-03 DIAGNOSIS — E559 Vitamin D deficiency, unspecified: Secondary | ICD-10-CM | POA: Diagnosis not present

## 2020-07-03 DIAGNOSIS — E785 Hyperlipidemia, unspecified: Secondary | ICD-10-CM | POA: Diagnosis not present

## 2020-07-03 DIAGNOSIS — D509 Iron deficiency anemia, unspecified: Secondary | ICD-10-CM | POA: Diagnosis not present

## 2020-07-03 DIAGNOSIS — Z0000 Encounter for general adult medical examination without abnormal findings: Secondary | ICD-10-CM | POA: Diagnosis not present

## 2020-07-03 DIAGNOSIS — R7309 Other abnormal glucose: Secondary | ICD-10-CM | POA: Diagnosis not present

## 2020-07-03 DIAGNOSIS — R7303 Prediabetes: Secondary | ICD-10-CM | POA: Diagnosis not present

## 2020-08-09 DIAGNOSIS — K5900 Constipation, unspecified: Secondary | ICD-10-CM | POA: Diagnosis not present

## 2020-08-25 ENCOUNTER — Other Ambulatory Visit: Payer: Self-pay | Admitting: Physician Assistant

## 2020-08-25 ENCOUNTER — Ambulatory Visit
Admission: RE | Admit: 2020-08-25 | Discharge: 2020-08-25 | Disposition: A | Discharge status: Home / Self Care | Payer: Medicare Other | Source: Ambulatory Visit | Attending: Physician Assistant | Admitting: Physician Assistant

## 2020-08-25 DIAGNOSIS — R110 Nausea: Secondary | ICD-10-CM | POA: Diagnosis not present

## 2020-08-25 DIAGNOSIS — R195 Other fecal abnormalities: Secondary | ICD-10-CM

## 2020-08-25 DIAGNOSIS — R634 Abnormal weight loss: Secondary | ICD-10-CM | POA: Diagnosis not present

## 2020-08-25 DIAGNOSIS — K219 Gastro-esophageal reflux disease without esophagitis: Secondary | ICD-10-CM | POA: Diagnosis not present

## 2020-08-25 DIAGNOSIS — R194 Change in bowel habit: Secondary | ICD-10-CM | POA: Diagnosis not present

## 2020-08-25 IMAGING — CR DG ABDOMEN 2V
2 series · 2 of 2 positions shown · non-contrast
Comparison: None.

CLINICAL DATA: Change in bowel habits

EXAM:
ABDOMEN - 2 VIEW

[w abdomen upright]
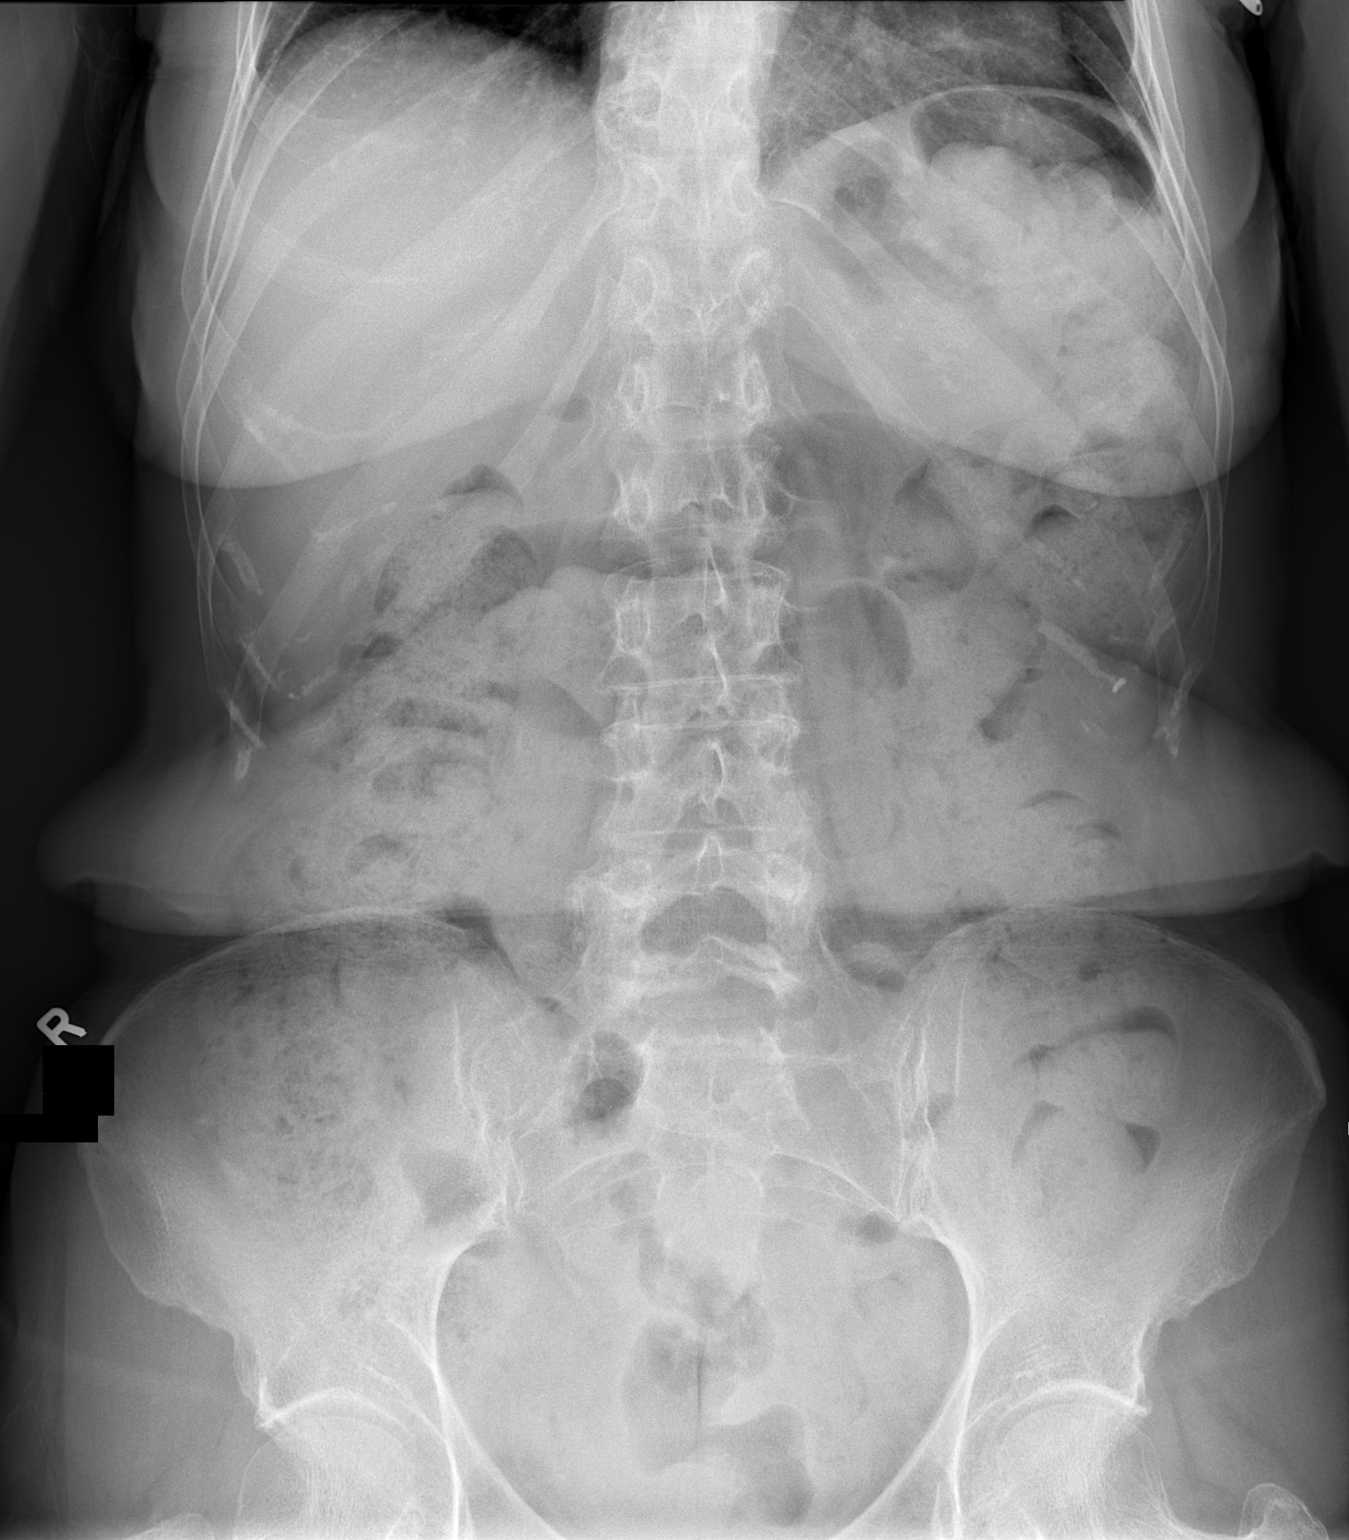

[t abdomen supine]
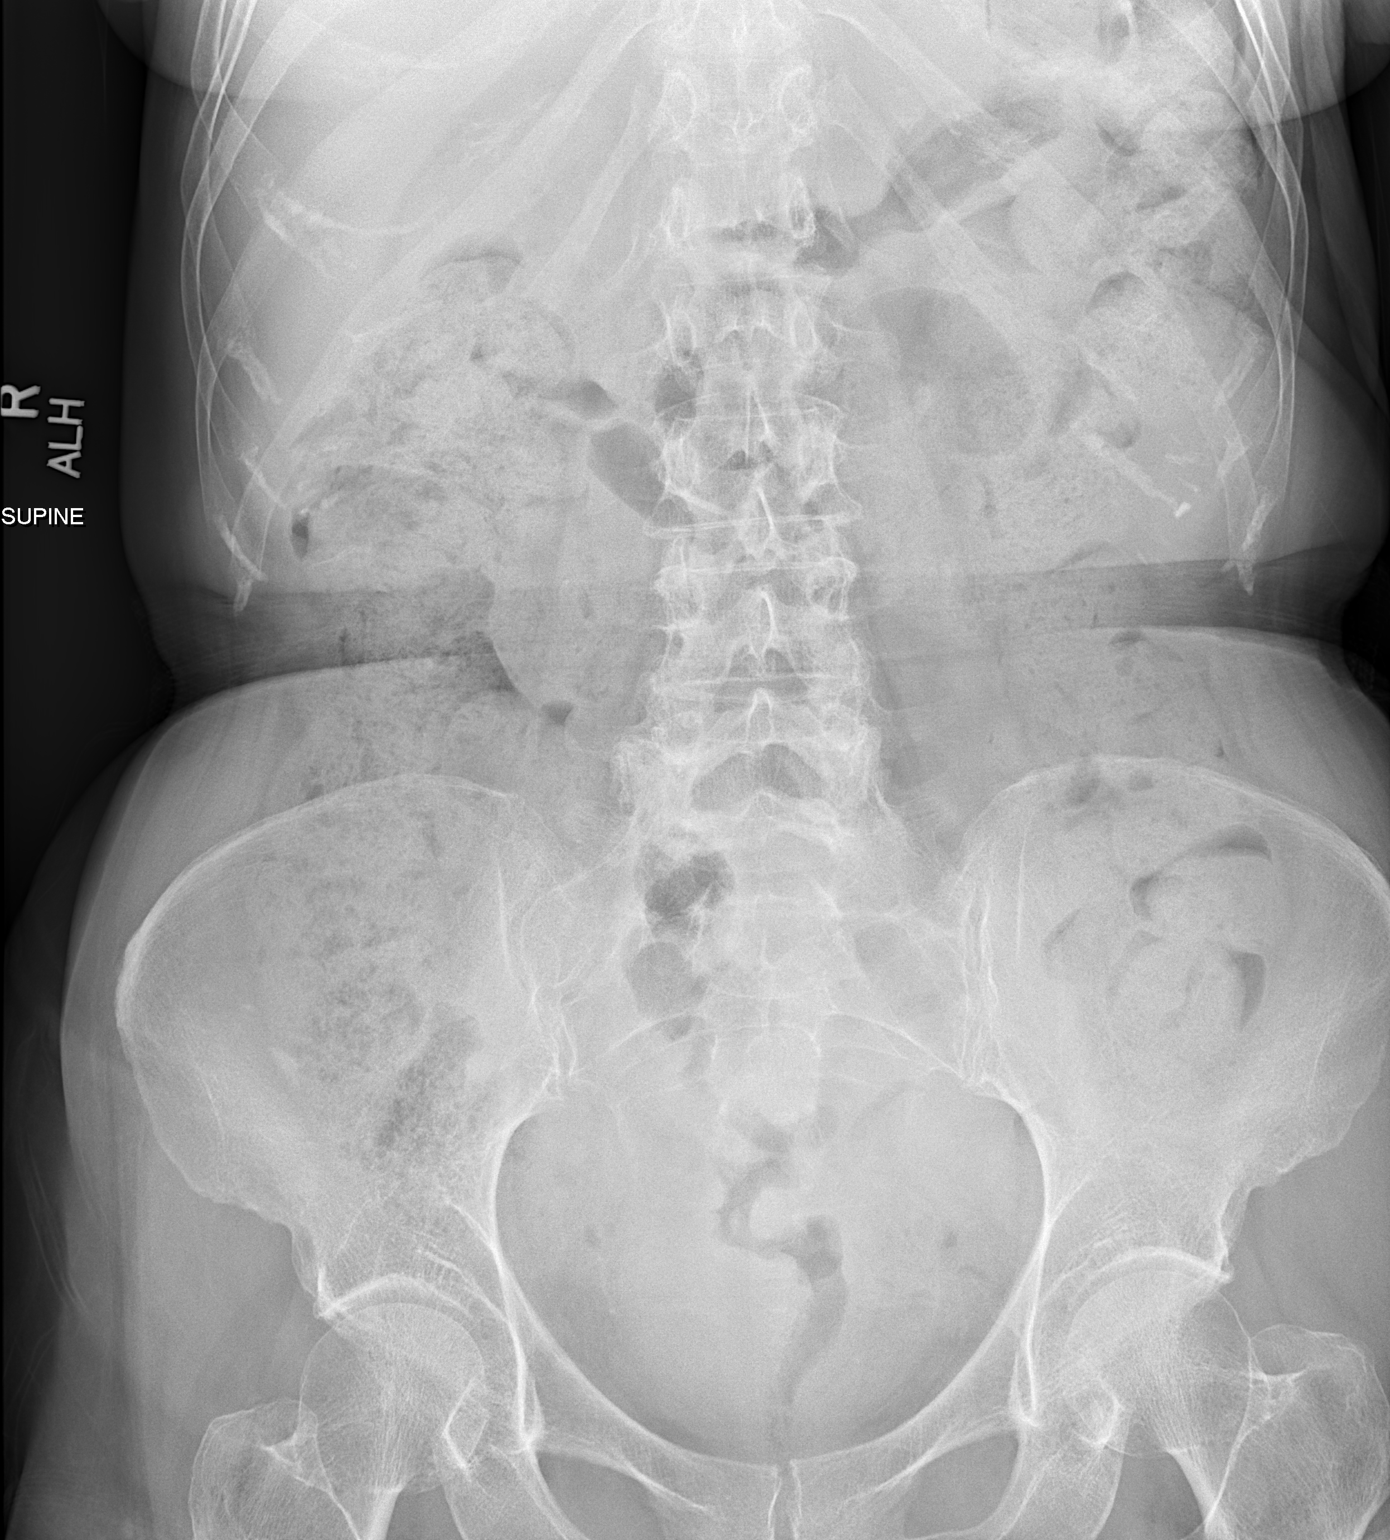

[2 of 2 positions shown; findings below may reference images not displayed]

FINDINGS: Normal abdominal gas pattern. Moderate stool throughout the colon.
No free intraperitoneal gas. No organomegaly. No abnormal
calcifications within the abdomen and pelvis. No acute bone
abnormality.
IMPRESSION: Normal abdominal gas pattern.  Moderate stool.

## 2020-08-28 ENCOUNTER — Inpatient Hospital Stay (HOSPITAL_BASED_OUTPATIENT_CLINIC_OR_DEPARTMENT_OTHER)
Admission: EM | Admit: 2020-08-28 | Discharge: 2020-09-04 | DRG: 749 | Disposition: A | Discharge status: Home / Self Care | Payer: Medicare Other | Attending: Internal Medicine | Admitting: Internal Medicine

## 2020-08-28 ENCOUNTER — Emergency Department (HOSPITAL_BASED_OUTPATIENT_CLINIC_OR_DEPARTMENT_OTHER): Payer: Medicare Other

## 2020-08-28 ENCOUNTER — Encounter (HOSPITAL_BASED_OUTPATIENT_CLINIC_OR_DEPARTMENT_OTHER): Payer: Self-pay

## 2020-08-28 ENCOUNTER — Other Ambulatory Visit: Payer: Self-pay

## 2020-08-28 DIAGNOSIS — E871 Hypo-osmolality and hyponatremia: Secondary | ICD-10-CM

## 2020-08-28 DIAGNOSIS — R1900 Intra-abdominal and pelvic swelling, mass and lump, unspecified site: Secondary | ICD-10-CM | POA: Diagnosis not present

## 2020-08-28 DIAGNOSIS — N1330 Unspecified hydronephrosis: Secondary | ICD-10-CM

## 2020-08-28 DIAGNOSIS — Z800 Family history of malignant neoplasm of digestive organs: Secondary | ICD-10-CM | POA: Diagnosis not present

## 2020-08-28 DIAGNOSIS — R739 Hyperglycemia, unspecified: Secondary | ICD-10-CM | POA: Diagnosis not present

## 2020-08-28 DIAGNOSIS — E785 Hyperlipidemia, unspecified: Secondary | ICD-10-CM | POA: Diagnosis not present

## 2020-08-28 DIAGNOSIS — C799 Secondary malignant neoplasm of unspecified site: Secondary | ICD-10-CM | POA: Diagnosis not present

## 2020-08-28 DIAGNOSIS — D630 Anemia in neoplastic disease: Secondary | ICD-10-CM | POA: Diagnosis present

## 2020-08-28 DIAGNOSIS — E440 Moderate protein-calorie malnutrition: Secondary | ICD-10-CM | POA: Diagnosis not present

## 2020-08-28 DIAGNOSIS — D490 Neoplasm of unspecified behavior of digestive system: Secondary | ICD-10-CM | POA: Diagnosis not present

## 2020-08-28 DIAGNOSIS — K219 Gastro-esophageal reflux disease without esophagitis: Secondary | ICD-10-CM | POA: Diagnosis present

## 2020-08-28 DIAGNOSIS — E861 Hypovolemia: Secondary | ICD-10-CM | POA: Diagnosis present

## 2020-08-28 DIAGNOSIS — R1909 Other intra-abdominal and pelvic swelling, mass and lump: Secondary | ICD-10-CM | POA: Diagnosis not present

## 2020-08-28 DIAGNOSIS — G9341 Metabolic encephalopathy: Secondary | ICD-10-CM | POA: Diagnosis not present

## 2020-08-28 DIAGNOSIS — D563 Thalassemia minor: Secondary | ICD-10-CM | POA: Diagnosis present

## 2020-08-28 DIAGNOSIS — K640 First degree hemorrhoids: Secondary | ICD-10-CM | POA: Diagnosis present

## 2020-08-28 DIAGNOSIS — Z8042 Family history of malignant neoplasm of prostate: Secondary | ICD-10-CM | POA: Diagnosis not present

## 2020-08-28 DIAGNOSIS — Z6822 Body mass index (BMI) 22.0-22.9, adult: Secondary | ICD-10-CM

## 2020-08-28 DIAGNOSIS — E1165 Type 2 diabetes mellitus with hyperglycemia: Secondary | ICD-10-CM | POA: Diagnosis not present

## 2020-08-28 DIAGNOSIS — N131 Hydronephrosis with ureteral stricture, not elsewhere classified: Secondary | ICD-10-CM | POA: Diagnosis present

## 2020-08-28 DIAGNOSIS — K5669 Other partial intestinal obstruction: Secondary | ICD-10-CM | POA: Diagnosis not present

## 2020-08-28 DIAGNOSIS — C55 Malignant neoplasm of uterus, part unspecified: Principal | ICD-10-CM | POA: Diagnosis present

## 2020-08-28 DIAGNOSIS — C775 Secondary and unspecified malignant neoplasm of intrapelvic lymph nodes: Secondary | ICD-10-CM | POA: Diagnosis not present

## 2020-08-28 DIAGNOSIS — I10 Essential (primary) hypertension: Secondary | ICD-10-CM | POA: Diagnosis not present

## 2020-08-28 DIAGNOSIS — E860 Dehydration: Secondary | ICD-10-CM | POA: Diagnosis not present

## 2020-08-28 DIAGNOSIS — K6389 Other specified diseases of intestine: Secondary | ICD-10-CM | POA: Diagnosis not present

## 2020-08-28 DIAGNOSIS — K589 Irritable bowel syndrome without diarrhea: Secondary | ICD-10-CM | POA: Diagnosis present

## 2020-08-28 DIAGNOSIS — D649 Anemia, unspecified: Secondary | ICD-10-CM

## 2020-08-28 DIAGNOSIS — D75839 Thrombocytosis, unspecified: Secondary | ICD-10-CM | POA: Diagnosis present

## 2020-08-28 DIAGNOSIS — Z8249 Family history of ischemic heart disease and other diseases of the circulatory system: Secondary | ICD-10-CM | POA: Diagnosis not present

## 2020-08-28 DIAGNOSIS — E876 Hypokalemia: Secondary | ICD-10-CM

## 2020-08-28 DIAGNOSIS — R109 Unspecified abdominal pain: Secondary | ICD-10-CM | POA: Diagnosis not present

## 2020-08-28 DIAGNOSIS — R590 Localized enlarged lymph nodes: Secondary | ICD-10-CM | POA: Diagnosis not present

## 2020-08-28 DIAGNOSIS — N179 Acute kidney failure, unspecified: Secondary | ICD-10-CM | POA: Diagnosis not present

## 2020-08-28 DIAGNOSIS — C763 Malignant neoplasm of pelvis: Secondary | ICD-10-CM | POA: Diagnosis not present

## 2020-08-28 DIAGNOSIS — D509 Iron deficiency anemia, unspecified: Secondary | ICD-10-CM | POA: Diagnosis present

## 2020-08-28 DIAGNOSIS — R599 Enlarged lymph nodes, unspecified: Secondary | ICD-10-CM | POA: Diagnosis not present

## 2020-08-28 DIAGNOSIS — Z20822 Contact with and (suspected) exposure to covid-19: Secondary | ICD-10-CM | POA: Diagnosis not present

## 2020-08-28 DIAGNOSIS — C801 Malignant (primary) neoplasm, unspecified: Secondary | ICD-10-CM | POA: Diagnosis not present

## 2020-08-28 DIAGNOSIS — K5900 Constipation, unspecified: Secondary | ICD-10-CM

## 2020-08-28 DIAGNOSIS — K633 Ulcer of intestine: Secondary | ICD-10-CM | POA: Diagnosis present

## 2020-08-28 DIAGNOSIS — R933 Abnormal findings on diagnostic imaging of other parts of digestive tract: Secondary | ICD-10-CM | POA: Diagnosis not present

## 2020-08-28 DIAGNOSIS — R1084 Generalized abdominal pain: Secondary | ICD-10-CM | POA: Diagnosis not present

## 2020-08-28 DIAGNOSIS — Z452 Encounter for adjustment and management of vascular access device: Secondary | ICD-10-CM | POA: Diagnosis not present

## 2020-08-28 DIAGNOSIS — C549 Malignant neoplasm of corpus uteri, unspecified: Secondary | ICD-10-CM

## 2020-08-28 HISTORY — DX: Irritable bowel syndrome, unspecified: K58.9

## 2020-08-28 HISTORY — DX: Hyperlipidemia, unspecified: E78.5

## 2020-08-28 HISTORY — DX: Essential (primary) hypertension: I10

## 2020-08-28 HISTORY — DX: Thalassemia, unspecified: D56.9

## 2020-08-28 HISTORY — DX: Anemia, unspecified: D64.9

## 2020-08-28 LAB — URINALYSIS, ROUTINE W REFLEX MICROSCOPIC
Bilirubin Urine: NEGATIVE
Glucose, UA: NEGATIVE mg/dL
Hgb urine dipstick: NEGATIVE
Ketones, ur: NEGATIVE mg/dL
Leukocytes,Ua: NEGATIVE
Nitrite: NEGATIVE
Specific Gravity, Urine: 1.012 (ref 1.005–1.030)
pH: 5.5 (ref 5.0–8.0)

## 2020-08-28 LAB — CBC WITH DIFFERENTIAL/PLATELET
Abs Immature Granulocytes: 0.46 10*3/uL — ABNORMAL HIGH (ref 0.00–0.07)
Basophils Absolute: 0.1 10*3/uL (ref 0.0–0.1)
Basophils Relative: 0 %
Eosinophils Absolute: 0.2 10*3/uL (ref 0.0–0.5)
Eosinophils Relative: 1 %
HCT: 26.1 % — ABNORMAL LOW (ref 36.0–46.0)
Hemoglobin: 8.7 g/dL — ABNORMAL LOW (ref 12.0–15.0)
Immature Granulocytes: 3 %
Lymphocytes Relative: 6 %
Lymphs Abs: 1 10*3/uL (ref 0.7–4.0)
MCH: 21.2 pg — ABNORMAL LOW (ref 26.0–34.0)
MCHC: 33.3 g/dL (ref 30.0–36.0)
MCV: 63.7 fL — ABNORMAL LOW (ref 80.0–100.0)
Monocytes Absolute: 1.3 10*3/uL — ABNORMAL HIGH (ref 0.1–1.0)
Monocytes Relative: 8 %
Neutro Abs: 12.7 10*3/uL — ABNORMAL HIGH (ref 1.7–7.7)
Neutrophils Relative %: 82 %
Platelets: 435 10*3/uL — ABNORMAL HIGH (ref 150–400)
RBC: 4.1 MIL/uL (ref 3.87–5.11)
RDW: 13.2 % (ref 11.5–15.5)
WBC: 15.6 10*3/uL — ABNORMAL HIGH (ref 4.0–10.5)
nRBC: 0 % (ref 0.0–0.2)

## 2020-08-28 LAB — COMPREHENSIVE METABOLIC PANEL
ALT: 17 U/L (ref 0–44)
AST: 20 U/L (ref 15–41)
Albumin: 3.6 g/dL (ref 3.5–5.0)
Alkaline Phosphatase: 85 U/L (ref 38–126)
Anion gap: 16 — ABNORMAL HIGH (ref 5–15)
BUN: 18 mg/dL (ref 8–23)
CO2: 21 mmol/L — ABNORMAL LOW (ref 22–32)
Calcium: 8.8 mg/dL — ABNORMAL LOW (ref 8.9–10.3)
Chloride: 80 mmol/L — ABNORMAL LOW (ref 98–111)
Creatinine, Ser: 1.22 mg/dL — ABNORMAL HIGH (ref 0.44–1.00)
GFR, Estimated: 47 mL/min — ABNORMAL LOW (ref >60)
Glucose, Bld: 135 mg/dL — ABNORMAL HIGH (ref 70–99)
Potassium: 2.6 mmol/L — CL (ref 3.5–5.1)
Sodium: 117 mmol/L — CL (ref 135–145)
Total Bilirubin: 0.5 mg/dL (ref 0.3–1.2)
Total Protein: 6.9 g/dL (ref 6.5–8.1)

## 2020-08-28 LAB — BASIC METABOLIC PANEL
Anion gap: 16 — ABNORMAL HIGH (ref 5–15)
BUN: 17 mg/dL (ref 8–23)
CO2: 21 mmol/L — ABNORMAL LOW (ref 22–32)
Calcium: 8.6 mg/dL — ABNORMAL LOW (ref 8.9–10.3)
Chloride: 83 mmol/L — ABNORMAL LOW (ref 98–111)
Creatinine, Ser: 1.24 mg/dL — ABNORMAL HIGH (ref 0.44–1.00)
GFR, Estimated: 46 mL/min — ABNORMAL LOW (ref >60)
Glucose, Bld: 130 mg/dL — ABNORMAL HIGH (ref 70–99)
Potassium: 2.6 mmol/L — CL (ref 3.5–5.1)
Sodium: 120 mmol/L — ABNORMAL LOW (ref 135–145)

## 2020-08-28 LAB — LIPASE, BLOOD: Lipase: 24 U/L (ref 11–51)

## 2020-08-28 LAB — MAGNESIUM: Magnesium: 1.5 mg/dL — ABNORMAL LOW (ref 1.7–2.4)

## 2020-08-28 LAB — RESP PANEL BY RT-PCR (FLU A&B, COVID) ARPGX2
Influenza A by PCR: NEGATIVE
Influenza B by PCR: NEGATIVE
SARS Coronavirus 2 by RT PCR: NEGATIVE

## 2020-08-28 LAB — OCCULT BLOOD X 1 CARD TO LAB, STOOL: Fecal Occult Bld: NEGATIVE

## 2020-08-28 IMAGING — CT CT ABD-PELV W/O CM
1 of 2 series · 13 of 32 positions shown, 17 images · non-contrast
Comparison: Radiograph [DATE]

CLINICAL DATA: Abdominal pain, acute, nonlocalized

EXAM:
CT ABDOMEN AND PELVIS WITHOUT CONTRAST
TECHNIQUE: Multidetector CT imaging of the abdomen and pelvis was performed
following the standard protocol without IV contrast.

[Series 2: abd pel wo · axial · 0.62mm/px · z∈[+932,+1297]mm · 13 of 85 slices shown, 17 images]
[im 8/85  soft-tissue]
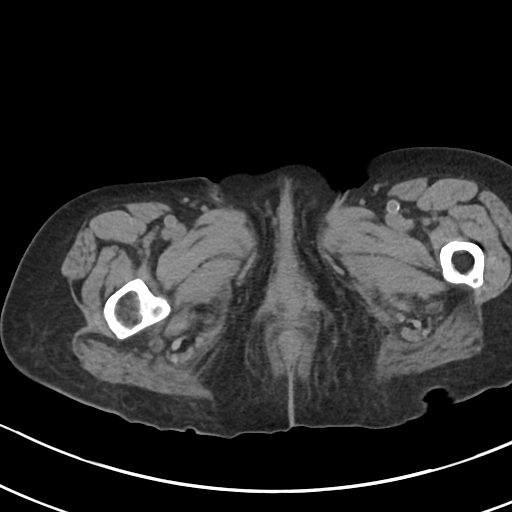
[im 8/85  bone]
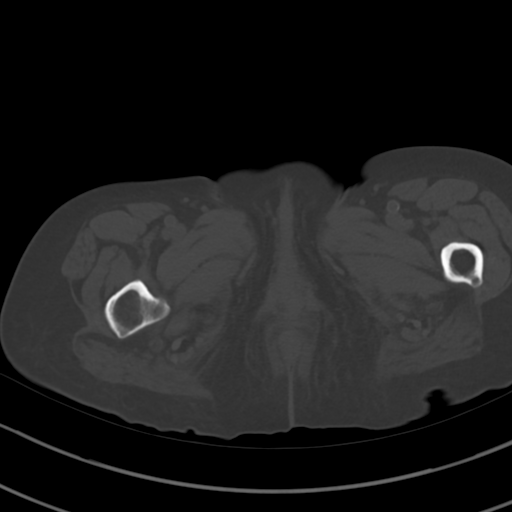
[im 15/85  soft-tissue]
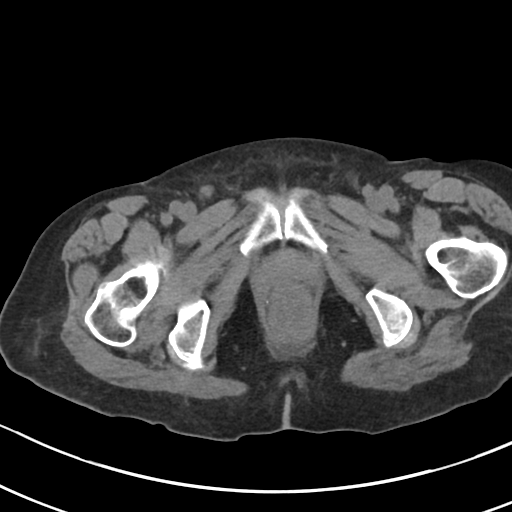
[im 22/85  soft-tissue]
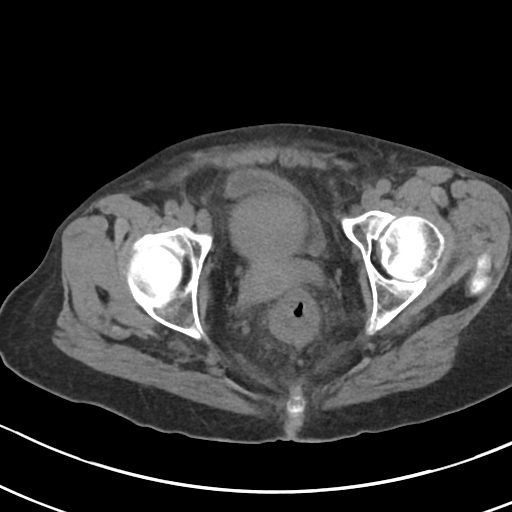
[im 29/85  soft-tissue]
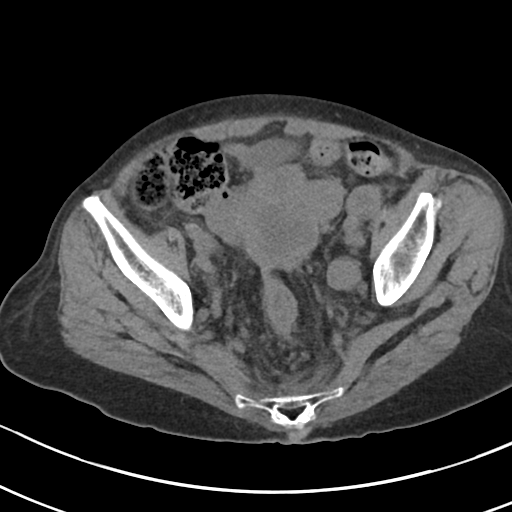
[im 36/85  soft-tissue]
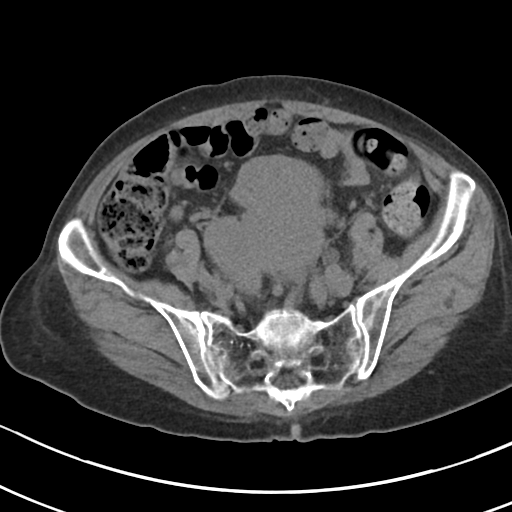
[im 43/85  soft-tissue]
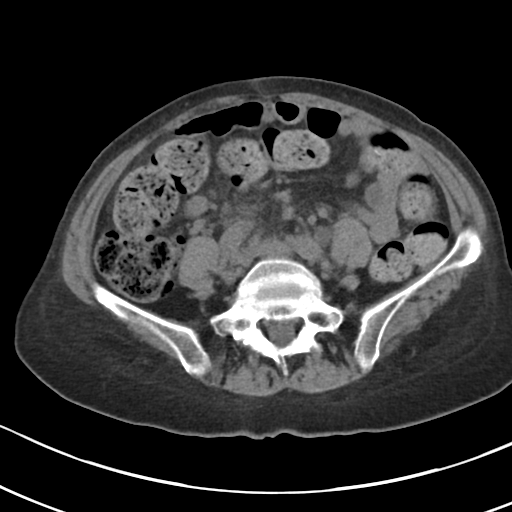
[im 50/85  soft-tissue]
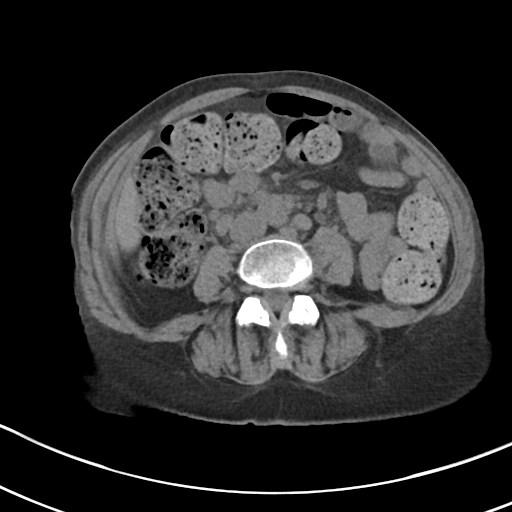
[im 57/85  soft-tissue]
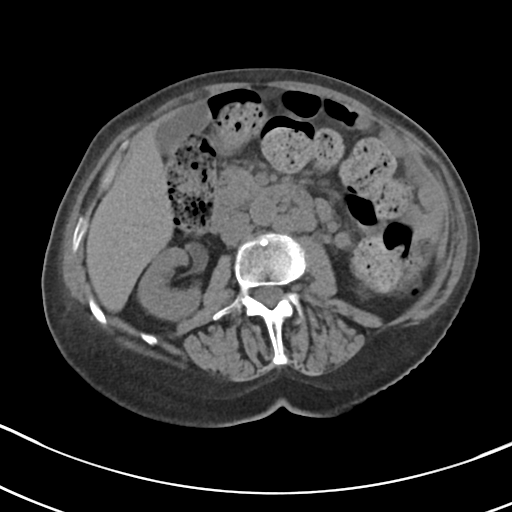
[im 64/85  soft-tissue]
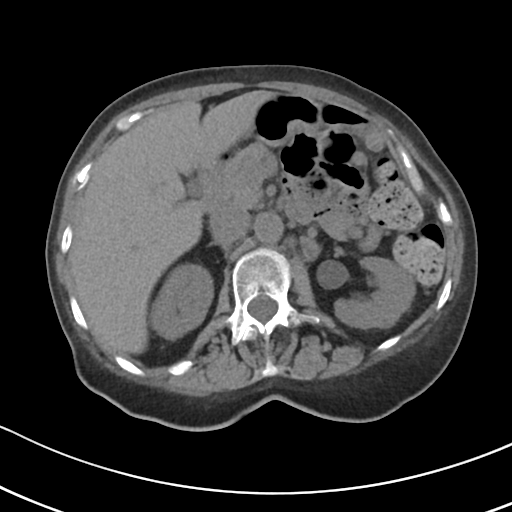
[im 64/85  bone]
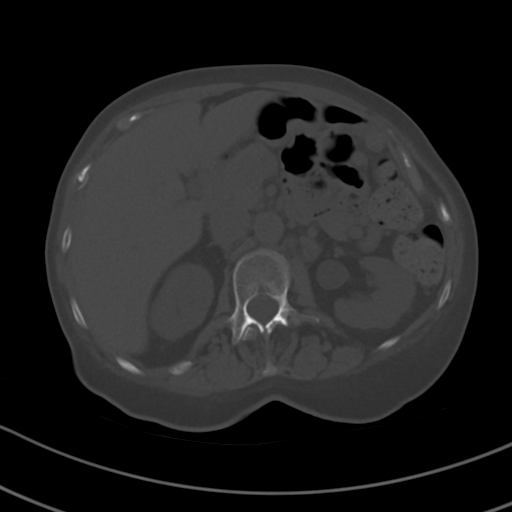
[im 71/85  soft-tissue]
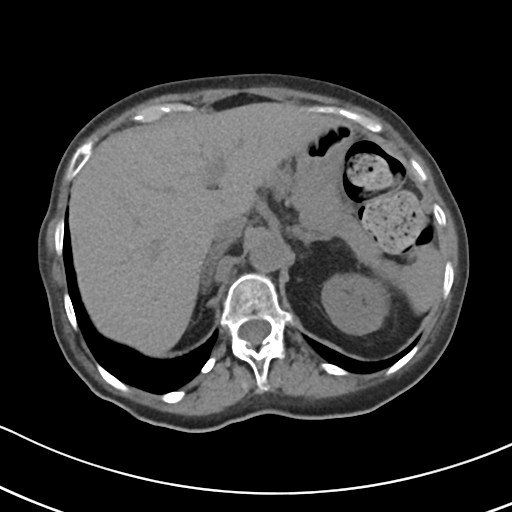
[im 71/85  lung]
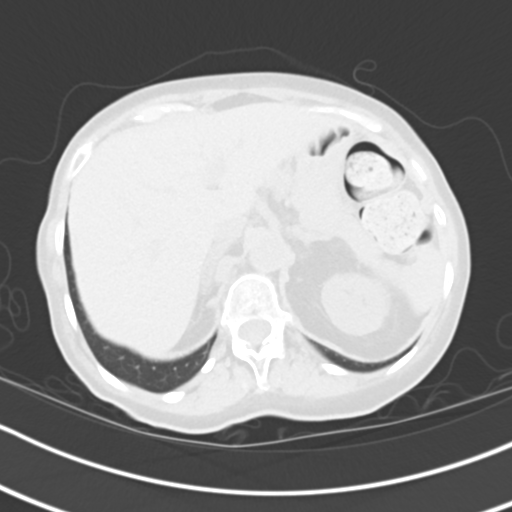
[im 74/85  lung]
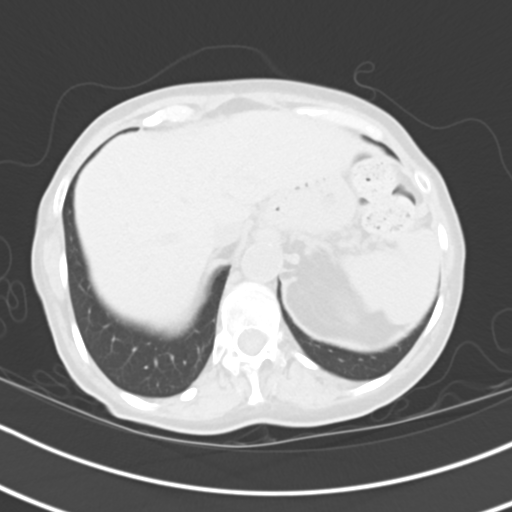
[im 78/85  soft-tissue]
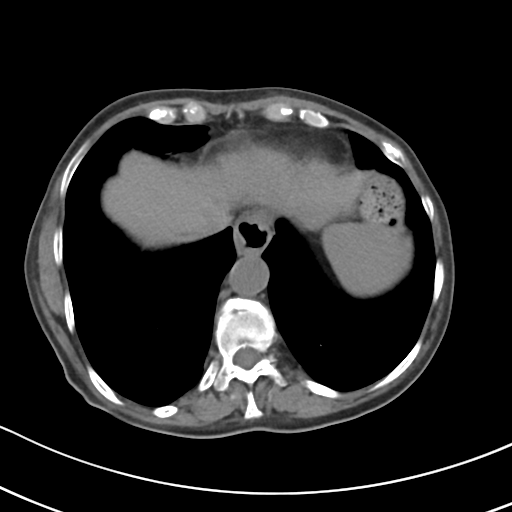
[im 78/85  lung]
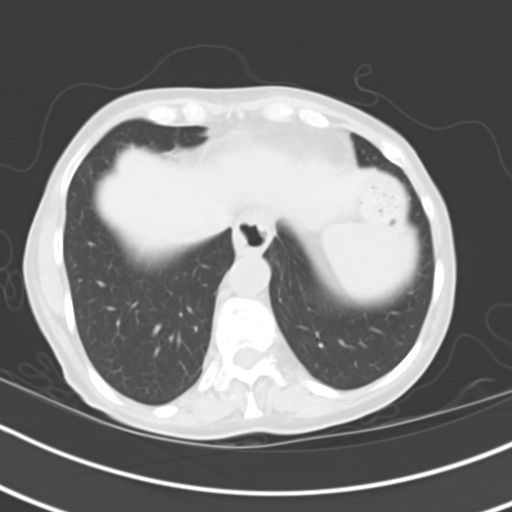
[im 81/85  lung]
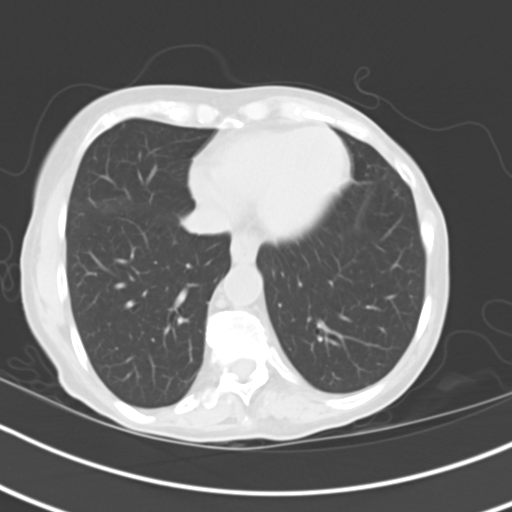

[13 of 32 positions shown; findings below may reference images not displayed]

FINDINGS: Lower chest: Lung bases are clear. No focal consolidation or pleural
fluid. Small hiatal hernia.

Hepatobiliary: No focal liver abnormality is seen. No gallstones,
gallbladder wall thickening, or biliary dilatation.

Pancreas: No ductal dilatation or inflammation.

Spleen: Normal in size without focal abnormality.

Adrenals/Urinary Tract: No adrenal nodule. Minimal left
hydronephrosis. Left ureter is dilated to the pelvis. No renal or
ureteral stone. No significant perinephric edema. No right
hydronephrosis. No right renal stone. Decompressed right ureter.
Urinary bladder is essentially empty and not well assessed.

Stomach/Bowel: Bowel evaluation is limited in the absence of enteric
contrast. There is wall thickening of the sigmoid colon with mild
pericolonic edema. The mid sigmoid colon appears apposed to the
uterus with obliteration of normal fat plane. Sigmoid colon is also
difficult to delineate from a pelvic mass, which is also contiguous
with the uterus. Please note that detailed assessment is limited on
this noncontrast exam. There is a moderate to large volume of stool
in the more proximal colon. No definite small bowel obstruction or
inflammatory change. Appendix is normal. Small to moderate hiatal
hernia.

Vascular/Lymphatic: Multifocal adenopathy. Please note that detailed
assessment of adenopathy is limited on this noncontrast exam. For
instance, left periaortic node measures 16 mm short axis, series 2,
image 30. Additional prominent and mildly enlarged lower
retroperitoneal lymph nodes are seen. Right upper quadrant
adenopathy with lymph nodes measuring 11 in 15 mm, series 2, image
36, likely in the mesentery. Right lower quadrant mesenteric node
measures 13 mm, series 2, image 44. There additional enlarged
ileocolic lymph nodes. 9 mm right external iliac node. Rounded soft
tissue mass about the left pelvic sidewall measures 2.3 x 3.3 cm,
may be adnexal mass or iliac adenopathy. There is a 17 mm left
external iliac node, series 2, image 58. Prominent bilateral
inguinal nodes.

Reproductive: Pelvic mass just to the left of midline measures 7.5 x
4.8 cm, and is contiguous with the sigmoid colon and uterus. The
ovary is not definitively visualized. Right ovary is not
definitively seen.

Other: Mild fat stranding in the pelvis as well as presacral region.
Minimal omental edema without obvious omental thickening or mass. No
ascites. No free air.

Musculoskeletal: No acute osseous abnormality or focal bone lesion.
No subcutaneous abnormality.
IMPRESSION: 1. Pelvic mass measuring 7.5 x 4.8 cm, contiguous with the sigmoid
colon and uterus with loss of fat planes. Site of origin is unclear,
may be ovarian, uterine, or colonic. Lack of contrast limits
detailed assessment.
2. Above pelvic mass causes partial obstruction of the left ureter
with mild left hydroureteronephrosis.
3. Multifocal adenopathy in the abdomen and pelvis, suspicious for
metastatic disease, primarily in the mesentery and lower peritoneum
and pelvis. Please note that detailed assessment of adenopathy is
limited on this noncontrast exam. Recommend oncology referral.
4. Wall thickening of the sigmoid colon distal to the left pelvic
mass with pericolonic edema, suspicious for colitis. There is a
moderate to large amount of stool in the more proximal colon,
suggesting pelvic mass may be causing delayed colonic transit.
5. Small to moderate hiatal hernia.

## 2020-08-28 MED ORDER — ONDANSETRON HCL 4 MG/2ML IJ SOLN
4.0000 mg | Freq: Once | INTRAMUSCULAR | Status: AC
  Administered 2020-08-28: 4 mg via INTRAVENOUS
  Filled 2020-08-28: qty 2

## 2020-08-28 MED ORDER — HEPARIN SODIUM (PORCINE) 5000 UNIT/ML IJ SOLN
5000.0000 [IU] | Freq: Three times a day (TID) | INTRAMUSCULAR | Status: DC
  Administered 2020-08-29: 5000 [IU] via SUBCUTANEOUS
  Filled 2020-08-28: qty 1

## 2020-08-28 MED ORDER — POTASSIUM CHLORIDE CRYS ER 20 MEQ PO TBCR
40.0000 meq | EXTENDED_RELEASE_TABLET | Freq: Once | ORAL | Status: AC
  Administered 2020-08-28: 40 meq via ORAL
  Filled 2020-08-28: qty 2

## 2020-08-28 MED ORDER — OXYCODONE HCL 5 MG PO TABS
5.0000 mg | ORAL_TABLET | ORAL | Status: DC | PRN
  Administered 2020-09-01 – 2020-09-03 (×2): 5 mg via ORAL
  Filled 2020-08-28 (×2): qty 1

## 2020-08-28 MED ORDER — ACETAMINOPHEN 650 MG RE SUPP: 650.0000 mg | Freq: Four times a day (QID) | RECTAL | Status: DC | PRN

## 2020-08-28 MED ORDER — ONDANSETRON HCL 4 MG PO TABS: 4.0000 mg | ORAL_TABLET | Freq: Four times a day (QID) | ORAL | Status: DC | PRN

## 2020-08-28 MED ORDER — ACETAMINOPHEN 325 MG PO TABS: 650.0000 mg | ORAL_TABLET | Freq: Four times a day (QID) | ORAL | Status: DC | PRN

## 2020-08-28 MED ORDER — POTASSIUM CHLORIDE 10 MEQ/100ML IV SOLN
10.0000 meq | INTRAVENOUS | Status: AC
  Administered 2020-08-28 – 2020-08-29 (×4): 10 meq via INTRAVENOUS
  Filled 2020-08-28 (×2): qty 100

## 2020-08-28 MED ORDER — POLYETHYLENE GLYCOL 3350 17 G PO PACK: 17.0000 g | PACK | Freq: Every day | ORAL | Status: DC | PRN

## 2020-08-28 MED ORDER — SODIUM CHLORIDE 0.9% FLUSH
3.0000 mL | Freq: Two times a day (BID) | INTRAVENOUS | Status: DC
  Administered 2020-08-28 – 2020-09-03 (×8): 3 mL via INTRAVENOUS

## 2020-08-28 MED ORDER — POTASSIUM CHLORIDE 10 MEQ/100ML IV SOLN
10.0000 meq | Freq: Once | INTRAVENOUS | Status: AC
  Administered 2020-08-28: 10 meq via INTRAVENOUS
  Filled 2020-08-28: qty 100

## 2020-08-28 MED ORDER — MAGNESIUM SULFATE 2 GM/50ML IV SOLN
2.0000 g | Freq: Once | INTRAVENOUS | Status: AC
  Administered 2020-08-28: 2 g via INTRAVENOUS
  Filled 2020-08-28: qty 50

## 2020-08-28 MED ORDER — ONDANSETRON HCL 4 MG/2ML IJ SOLN: 4.0000 mg | Freq: Four times a day (QID) | INTRAMUSCULAR | Status: DC | PRN

## 2020-08-28 MED ORDER — LACTATED RINGERS IV SOLN: INTRAVENOUS | Status: AC

## 2020-08-28 NOTE — Assessment & Plan Note (Signed)
-   possibly multifactorial in setting of decreased PO intake for weeks per patient with associated hypovolemia but may also have SIADH component in setting of suspected malignancy  - watch Na while on LR for now; CLD now then NPO at MN until seen by gyn/onc  - check serum osmo, FeNa, and urine osmo

## 2020-08-28 NOTE — Assessment & Plan Note (Signed)
-   K 2.6, Mg 1.5 - replete and recheck in am

## 2020-08-28 NOTE — ED Notes (Signed)
Report given to [facility] at this time by this RN

## 2020-08-28 NOTE — Assessment & Plan Note (Signed)
-   Mg 1.5 - repleted at [facility] with 2 g IV - repeat Mg in am

## 2020-08-28 NOTE — ED Notes (Signed)
Report called to Jeri Modena, Therapist, sports at [facility]

## 2020-08-28 NOTE — ED Triage Notes (Signed)
Pt reports sent here from [facility] for evaluation of dehydration and abnormal labs

## 2020-08-28 NOTE — Hospital Course (Signed)
Teresa Oliver is a 75 yo female with PMH HTN, HLD, IBS who initially presented to [place] from referral per GI after being found to have electrolyte derangements on outpatient lab work-up.  She also had been complaining of decreased urinary stream, constipation, decreased appetite/weight loss. She underwent CT abdomen/pelvis which showed a pelvic mass measuring 7.5 x 4.8 cm, contiguous with the sigmoid colon and uterus.  There was also partial obstruction of the left ureter with mild left hydroureteronephrosis.  Multifocal adenopathy in the abdomen and pelvis suspicious for metastatic disease. Also moderate to large amount of stool in proximal colon " suggesting pelvic mass may be causing delayed colonic transit."  She was transferred to [facility] for electrolyte replacement as well as further evaluation with gynecology/oncology and possibly urology.  The patient states that her last bowel movement was on Saturday.  She denies any dysuria.  She also denies fevers, chills, night sweats. Her appetite has been significantly decreased as noted and she has been nibbling at food for the past several weeks.  She has had mild nausea but denied vomiting.  She states her weight in February was approximately 121 pounds.  Current weight this admission is 113 pounds. She has no prior known history of cancer and states her last colonoscopy was at least 10 years ago.  She says she did Cologuard in 2018.

## 2020-08-28 NOTE — Assessment & Plan Note (Signed)
-   patient reports she was told she was "prediabetic" this year - check A1c and monitor CBG for now

## 2020-08-28 NOTE — Assessment & Plan Note (Signed)
-   see pelvic mass

## 2020-08-28 NOTE — ED Provider Notes (Signed)
[place] EMERGENCY DEPT Provider Note   CSN: [id number] Arrival date & time: 08/28/20  1617     History No chief complaint on file.   Teresa Oliver is a 75 y.o. female.  Pt presents to the ED today with abnormal labs.  The pt has had some intermittent abdominal pain for the past few weeks.  She then developed a rectal d/c that is just mucous.  She has seen her pcp who put her on Linzess which has not helped much.  Pt has lost weight.  She has persistent nausea.  [facility] drew some labs which showed some aki, low sodium/potassium, anemia and elevated wbc.  Pt said she has a hx of anemia, but has stopped taking her iron pills because they were causing constipation.        Past Medical History:  Diagnosis Date  . Anemia    chronic  . Hyperlipemia   . Hypertension   . Irritable bowel syndrome (IBS)     Patient Active Problem List   Diagnosis Date Noted  . Pelvic mass 08/28/2020    Past Surgical History:  Procedure Laterality Date  . BREAST EXCISIONAL BIOPSY Left    1985 benign  . HERNIA REPAIR       OB History   No obstetric history on file.     History reviewed. No pertinent family history.  Social History   Tobacco Use  . Smoking status: Never Smoker  . Smokeless tobacco: Never Used  Substance Use Topics  . Alcohol use: Not Currently  . Drug use: Not Currently    Home Medications Prior to Admission medications   Not on File    Allergies    Patient has no allergy information on record.  Review of Systems   Review of Systems  Gastrointestinal: Positive for abdominal pain, constipation, nausea and vomiting.  All other systems reviewed and are negative.   Physical Exam Updated Vital Signs BP (!) 178/84 (BP Location: Right Arm)   Pulse 97   Temp 99.4 F (37.4 C) (Oral)   Resp 16   Ht 5' (1.524 m)   Wt 51.1 kg   SpO2 100%   BMI 22.01 kg/m   Physical Exam Vitals and nursing note reviewed.  Constitutional:       Appearance: Normal appearance.  HENT:     Head: Normocephalic and atraumatic.     Right Ear: External ear normal.     Left Ear: External ear normal.     Nose: Nose normal.     Mouth/Throat:     Mouth: Mucous membranes are dry.  Eyes:     Extraocular Movements: Extraocular movements intact.     Conjunctiva/sclera: Conjunctivae normal.     Pupils: Pupils are equal, round, and reactive to light.  Cardiovascular:     Rate and Rhythm: Regular rhythm. Tachycardia present.     Pulses: Normal pulses.     Heart sounds: Normal heart sounds.  Pulmonary:     Effort: Pulmonary effort is normal.     Breath sounds: Normal breath sounds.  Abdominal:     General: Abdomen is flat. Bowel sounds are decreased.     Tenderness: There is generalized abdominal tenderness.  Musculoskeletal:        General: Normal range of motion.     Cervical back: Normal range of motion and neck supple.  Skin:    General: Skin is warm.     Capillary Refill: Capillary refill takes less than 2 seconds.  Neurological:     General: No focal deficit present.     Mental Status: She is alert and oriented to person, place, and time.  Psychiatric:        Mood and Affect: Mood normal.        Behavior: Behavior normal.        Thought Content: Thought content normal.     ED Results / Procedures / Treatments   Labs (all labs ordered are listed, but only abnormal results are displayed) Labs Reviewed  CBC WITH DIFFERENTIAL/PLATELET - Abnormal; Notable for the following components:      Result Value   WBC 15.6 (*)    Hemoglobin 8.7 (*)    HCT 26.1 (*)    MCV 63.7 (*)    MCH 21.2 (*)    Platelets 435 (*)    Neutro Abs 12.7 (*)    Monocytes Absolute 1.3 (*)    Abs Immature Granulocytes 0.46 (*)    All other components within normal limits  COMPREHENSIVE METABOLIC PANEL - Abnormal; Notable for the following components:   Sodium 117 (*)    Potassium 2.6 (*)    Chloride 80 (*)    CO2 21 (*)    Glucose, Bld 135 (*)     Creatinine, Ser 1.22 (*)    Calcium 8.8 (*)    GFR, Estimated 47 (*)    Anion gap 16 (*)    All other components within normal limits  URINALYSIS, ROUTINE W REFLEX MICROSCOPIC - Abnormal; Notable for the following components:   Protein, ur TRACE (*)    All other components within normal limits  MAGNESIUM - Abnormal; Notable for the following components:   Magnesium 1.5 (*)    All other components within normal limits  RESP PANEL BY RT-PCR (FLU A&B, COVID) ARPGX2  LIPASE, BLOOD  OCCULT BLOOD X 1 CARD TO LAB, STOOL  BASIC METABOLIC PANEL    EKG None  Radiology CT ABDOMEN PELVIS WO CONTRAST  Result Date: 08/28/2020 CLINICAL DATA:  Abdominal pain, acute, nonlocalized EXAM: CT ABDOMEN AND PELVIS WITHOUT CONTRAST TECHNIQUE: Multidetector CT imaging of the abdomen and pelvis was performed following the standard protocol without IV contrast. COMPARISON:  Radiograph 08/25/2020 FINDINGS: Lower chest: Lung bases are clear. No focal consolidation or pleural fluid. Small hiatal hernia. Hepatobiliary: No focal liver abnormality is seen. No gallstones, gallbladder wall thickening, or biliary dilatation. Pancreas: No ductal dilatation or inflammation. Spleen: Normal in size without focal abnormality. Adrenals/Urinary Tract: No adrenal nodule. Minimal left hydronephrosis. Left ureter is dilated to the pelvis. No renal or ureteral stone. No significant perinephric edema. No right hydronephrosis. No right renal stone. Decompressed right ureter. Urinary bladder is essentially empty and not well assessed. Stomach/Bowel: Bowel evaluation is limited in the absence of enteric contrast. There is wall thickening of the sigmoid colon with mild pericolonic edema. The mid sigmoid colon appears apposed to the uterus with obliteration of normal fat plane. Sigmoid colon is also difficult to delineate from a pelvic mass, which is also contiguous with the uterus. Please note that detailed assessment is limited on this  noncontrast exam. There is a moderate to large volume of stool in the more proximal colon. No definite small bowel obstruction or inflammatory change. Appendix is normal. Small to moderate hiatal hernia. Vascular/Lymphatic: Multifocal adenopathy. Please note that detailed assessment of adenopathy is limited on this noncontrast exam. For instance, left periaortic node measures 16 mm short axis, series 2, image 30. Additional prominent and mildly enlarged lower retroperitoneal  lymph nodes are seen. Right upper quadrant adenopathy with lymph nodes measuring 11 in 15 mm, series 2, image 36, likely in the mesentery. Right lower quadrant mesenteric node measures 13 mm, series 2, image 44. There additional enlarged ileocolic lymph nodes. 9 mm right external iliac node. Rounded soft tissue mass about the left pelvic sidewall measures 2.3 x 3.3 cm, may be adnexal mass or iliac adenopathy. There is a 17 mm left external iliac node, series 2, image 58. Prominent bilateral inguinal nodes. Reproductive: Pelvic mass just to the left of midline measures 7.5 x 4.8 cm, and is contiguous with the sigmoid colon and uterus. The ovary is not definitively visualized. Right ovary is not definitively seen. Other: Mild fat stranding in the pelvis as well as presacral region. Minimal omental edema without obvious omental thickening or mass. No ascites. No free air. Musculoskeletal: No acute osseous abnormality or focal bone lesion. No subcutaneous abnormality. IMPRESSION: 1. Pelvic mass measuring 7.5 x 4.8 cm, contiguous with the sigmoid colon and uterus with loss of fat planes. Site of origin is unclear, may be ovarian, uterine, or colonic. Lack of contrast limits detailed assessment. 2. Above pelvic mass causes partial obstruction of the left ureter with mild left hydroureteronephrosis. 3. Multifocal adenopathy in the abdomen and pelvis, suspicious for metastatic disease, primarily in the mesentery and lower peritoneum and pelvis. Please  note that detailed assessment of adenopathy is limited on this noncontrast exam. Recommend oncology referral. 4. Wall thickening of the sigmoid colon distal to the left pelvic mass with pericolonic edema, suspicious for colitis. There is a moderate to large amount of stool in the more proximal colon, suggesting pelvic mass may be causing delayed colonic transit. 5. Small to moderate hiatal hernia. Electronically Signed   By: Keith Rake M.D.   On: 08/28/2020 18:30    Procedures Procedures   Medications Ordered in ED Medications  potassium chloride 10 mEq in 100 mL IVPB (10 mEq Intravenous New Bag/Given 08/28/20 1816)  magnesium sulfate IVPB 2 g 50 mL (2 g Intravenous New Bag/Given 08/28/20 1845)  ondansetron (ZOFRAN) injection 4 mg (4 mg Intravenous Given 08/28/20 1758)    ED Course  I have reviewed the triage vital signs and the nursing notes.  Pertinent labs & imaging results that were available during my care of the patient were reviewed by me and considered in my medical decision making (see chart for details).    MDM Rules/Calculators/A&P                          Pt has low sodium and low potassium and low magnesium.  K and Mg have been replaced.  Pt given IVFs as well.  Anemia is worse.  FOBT neg.  No stool on my finger.  Pt's CT scan unfortunately shows a large pelvic mass.  There are likely mets to the lymph nodes.  She has a partial obstruction of the left ureter and some delayed colonic transit likely from the pelvic mass.   Pt d/w Dr. Flossie Buffy ([facility]) who [facility] to [facility] for admission.  CRITICAL CARE Performed by: Isla Pence   Total critical care time: 30 minutes  Critical care time was exclusive of separately billable procedures and treating other patients.  Critical care was necessary to treat or prevent imminent or life-threatening deterioration.  Critical care was time spent personally by me on the following activities: development of treatment plan with  patient and/or surrogate as well  as nursing, discussions with consultants, evaluation of patient's response to treatment, examination of patient, obtaining history from patient or surrogate, ordering and performing treatments and interventions, ordering and review of laboratory studies, ordering and review of radiographic studies, pulse oximetry and re-evaluation of patient's condition.  Final Clinical Impression(s) / ED Diagnoses Final diagnoses:  Hypokalemia  Hyponatremia  Hypomagnesemia  Anemia, unspecified type  Pelvic mass    Rx / DC Orders ED Discharge Orders    None       Isla Pence, MD 08/28/20 1901

## 2020-08-28 NOTE — Assessment & Plan Note (Signed)
-   per CT: "7.5 x 4.8 cm contiguous with the sigmoid colon and uterus with loss of fat planes. Site of origin is unclear, may be ovarian, uterine, or colonic". Also multifocal adenopathy in the abdomen and pelvis suspicious for metastatic disease, primarily in the mesentery and lower peritoneum and pelvis. -Appears that the mass is causing partial obstruction of left ureter with associated mild left hydroureteronephrosis and possible compression on colon causing delayed colonic transit with moderate to large stool burden noted on CT - discussed with gyn/onc tonight; formal consult in am to follow - NPO after MN; she mostly only wants ice chips at this time  - nausea and pain meds ordered - PRN laxative regimen for now until further gyn/onc evaluation

## 2020-08-28 NOTE — Assessment & Plan Note (Signed)
-   baseline creatinine unknown as no prior labs for comparison, but patient denies prior known renal dysfunction - creat 1.22, BUN 18 on admission - LR at 50 for now; repeat BMP in am - follow up FeNa

## 2020-08-28 NOTE — Assessment & Plan Note (Signed)
-   patient states now voiding "okay"; no prior labs to review, but creat 1.22 and BUN 18 on admission - will start low rate IVF and monitor output - depending on pelvic mass plan, may still need urology involvement in am to address if needs ureteral stent in the meantime - strict I&O for now

## 2020-08-28 NOTE — Progress Notes (Signed)
Date and time results received: 08/28/20 2300  Test: K+ Critical Value: 2.6  Name of Provider Notified: Dwyane Dee MD  Orders Received

## 2020-08-28 NOTE — H&P (Signed)
History and Physical    Teresa Oliver  BPZ:[id number]  DOB: 13-Dec-1945  DOA: 08/28/2020  PCP: Jani Gravel, MD Patient coming from: home  Chief Complaint: abnormal labs  HPI:  Teresa Oliver is a 75 yo female with PMH HTN, HLD, IBS who initially presented to [facility] from referral per GI after being found to have electrolyte derangements on outpatient lab work-up.  She also had been complaining of decreased urinary stream, constipation, decreased appetite/weight loss. She underwent CT abdomen/pelvis which showed a pelvic mass measuring 7.5 x 4.8 cm, contiguous with the sigmoid colon and uterus.  There was also partial obstruction of the left ureter with mild left hydroureteronephrosis.  Multifocal adenopathy in the abdomen and pelvis suspicious for metastatic disease. Also moderate to large amount of stool in proximal colon " suggesting pelvic mass may be causing delayed colonic transit."  She was transferred to [facility] for electrolyte replacement as well as further evaluation with gynecology/oncology and possibly urology.  The patient states that her last bowel movement was on Saturday.  She denies any dysuria.  She also denies fevers, chills, night sweats. Her appetite has been significantly decreased as noted and she has been nibbling at food for the past several weeks.  She has had mild nausea but denied vomiting.  She states her weight in February was approximately 121 pounds.  Current weight this admission is 113 pounds. She has no prior known history of cancer and states her last colonoscopy was at least 10 years ago.  She says she did Cologuard in 2018.   I have personally briefly reviewed patient's old medical records in [facility] and discussed patient with the ER provider when appropriate/indicated.  Assessment/Plan: * Pelvic mass - per CT: "7.5 x 4.8 cm contiguous with the sigmoid colon and uterus with loss of fat planes. Site of origin is unclear, may be  ovarian, uterine, or colonic". Also multifocal adenopathy in the abdomen and pelvis suspicious for metastatic disease, primarily in the mesentery and lower peritoneum and pelvis. -Appears that the mass is causing partial obstruction of left ureter with associated mild left hydroureteronephrosis and possible compression on colon causing delayed colonic transit with moderate to large stool burden noted on CT - discussed with gyn/onc tonight; formal consult in am to follow - NPO after MN; she mostly only wants ice chips at this time  - nausea and pain meds ordered - PRN laxative regimen for now until further gyn/onc evaluation  Hyponatremia - possibly multifactorial in setting of decreased PO intake for weeks per patient with associated hypovolemia but may also have SIADH component in setting of suspected malignancy  - watch Na while on LR for now; CLD now then NPO at MN until seen by gyn/onc  - check serum osmo, FeNa, and urine osmo  AKI (acute kidney injury) ([place]) - baseline creatinine unknown as no prior labs for comparison, but patient denies prior known renal dysfunction - creat 1.22, BUN 18 on admission - LR at 50 for now; repeat BMP in am - follow up FeNa  Hydroureteronephrosis - patient states now voiding "okay"; no prior labs to review, but creat 1.22 and BUN 18 on admission - will start low rate IVF and monitor output - depending on pelvic mass plan, may still need urology involvement in am to address if needs ureteral stent in the meantime - strict I&O for now  Constipation - see pelvic mass  Hyperglycemia - patient reports she was told she was "prediabetic" this  year - check A1c and monitor CBG for now  Hypomagnesemia - Mg 1.5 - repleted at [facility] with 2 g IV - repeat Mg in am  Hypokalemia - K 2.6, Mg 1.5 - replete and recheck in am    Code Status:     Code Status: Full Code  DVT Prophylaxis:   heparin injection 5,000 Units Start: 08/29/20 0600   Anticipated  disposition is to: home  History: Past Medical History:  Diagnosis Date  . Anemia    chronic  . Hyperlipemia   . Hypertension   . Irritable bowel syndrome (IBS)     Past Surgical History:  Procedure Laterality Date  . BREAST EXCISIONAL BIOPSY Left    1985 benign  . HERNIA REPAIR       reports that she has never smoked. She has never used smokeless tobacco. She reports previous alcohol use. She reports previous drug use.  No Known Allergies  Family History  Problem Relation Age of Onset  . Stomach cancer Mother   . Dementia Mother   . Heart attack Father   . Prostate cancer Brother    Home Medications: Prior to Admission medications   Medication Sig Start Date End Date Taking? Authorizing Provider  cholecalciferol (VITAMIN D3) 25 MCG (1000 UNIT) tablet Take 1,000 Units by mouth daily.   Yes [provider]  Ferrous Sulfate (IRON PO) Take 1 tablet by mouth daily.   Yes [provider]  losartan-hydrochlorothiazide (HYZAAR) 100-25 MG tablet Take 1 tablet by mouth daily. 06/20/20  Yes [provider]  pantoprazole (PROTONIX) 40 MG tablet Take 1 tablet by mouth every morning. 08/25/20  Yes [provider]  pravastatin (PRAVACHOL) 80 MG tablet Take 80 mg by mouth every evening. 06/19/20  Yes [provider]    Review of Systems:  Pertinent items noted in HPI and remainder of comprehensive ROS otherwise negative.  Physical Exam: Vitals:   08/28/20 1629 08/28/20 1632 08/28/20 1800 08/28/20 2300  BP:   (!) 178/84 (!) 148/81  Pulse:   97 91  Resp:   16 20  Temp:    98.4 F (36.9 C)  TempSrc:    Oral  SpO2:  100% 100% 99%  Weight: 51.1 kg     Height: 5' (1.524 m)      General appearance: alert, cooperative and no distress Head: Normocephalic, without obvious abnormality, atraumatic Eyes: EOMI Lungs: clear to auscultation bilaterally Heart: regular rate and rhythm and S1, S2 normal Abdomen: mildly distended; no palpable mass; BS  present; no TTP Extremities: no LE edema Skin: mobility and turgor normal Neurologic: Grossly normal  Labs on Admission:  I have personally reviewed following labs and imaging studies Results for orders placed or performed during the hospital encounter of 08/28/20 (from the past 24 hour(s))  CBC with Differential     Status: Abnormal   Collection Time: 08/28/20  4:48 PM  Result Value Ref Range   WBC 15.6 (H) 4.0 - 10.5 K/uL   RBC 4.10 3.87 - 5.11 MIL/uL   Hemoglobin 8.7 (L) 12.0 - 15.0 g/dL   HCT 26.1 (L) 36.0 - 46.0 %   MCV 63.7 (L) 80.0 - 100.0 fL   MCH 21.2 (L) 26.0 - 34.0 pg   MCHC 33.3 30.0 - 36.0 g/dL   RDW 13.2 11.5 - 15.5 %   Platelets 435 (H) 150 - 400 K/uL   nRBC 0.0 0.0 - 0.2 %   Neutrophils Relative % 82 %   Neutro Abs 12.7 (  H) 1.7 - 7.7 K/uL   Lymphocytes Relative 6 %   Lymphs Abs 1.0 0.7 - 4.0 K/uL   Monocytes Relative 8 %   Monocytes Absolute 1.3 (H) 0.1 - 1.0 K/uL   Eosinophils Relative 1 %   Eosinophils Absolute 0.2 0.0 - 0.5 K/uL   Basophils Relative 0 %   Basophils Absolute 0.1 0.0 - 0.1 K/uL   Immature Granulocytes 3 %   Abs Immature Granulocytes 0.46 (H) 0.00 - 0.07 K/uL   Acanthocytes PRESENT   Comprehensive metabolic panel     Status: Abnormal   Collection Time: 08/28/20  4:48 PM  Result Value Ref Range   Sodium 117 (LL) 135 - 145 mmol/L   Potassium 2.6 (LL) 3.5 - 5.1 mmol/L   Chloride 80 (L) 98 - 111 mmol/L   CO2 21 (L) 22 - 32 mmol/L   Glucose, Bld 135 (H) 70 - 99 mg/dL   BUN 18 8 - 23 mg/dL   Creatinine, Ser 1.22 (H) 0.44 - 1.00 mg/dL   Calcium 8.8 (L) 8.9 - 10.3 mg/dL   Total Protein 6.9 6.5 - 8.1 g/dL   Albumin 3.6 3.5 - 5.0 g/dL   AST 20 15 - 41 U/L   ALT 17 0 - 44 U/L   Alkaline Phosphatase 85 38 - 126 U/L   Total Bilirubin 0.5 0.3 - 1.2 mg/dL   GFR, Estimated 47 (L) >60 mL/min   Anion gap 16 (H) 5 - 15  Lipase, blood     Status: None   Collection Time: 08/28/20  4:48 PM  Result Value Ref Range   Lipase 24 11 - 51 U/L  Urinalysis,  Routine w reflex microscopic Urine, Clean Catch     Status: Abnormal   Collection Time: 08/28/20  4:48 PM  Result Value Ref Range   Color, Urine YELLOW YELLOW   APPearance CLEAR CLEAR   Specific Gravity, Urine 1.012 1.005 - 1.030   pH 5.5 5.0 - 8.0   Glucose, UA NEGATIVE NEGATIVE mg/dL   Hgb urine dipstick NEGATIVE NEGATIVE   Bilirubin Urine NEGATIVE NEGATIVE   Ketones, ur NEGATIVE NEGATIVE mg/dL   Protein, ur TRACE (A) NEGATIVE mg/dL   Nitrite NEGATIVE NEGATIVE   Leukocytes,Ua NEGATIVE NEGATIVE   RBC / HPF 0-5 0 - 5 RBC/hpf   WBC, UA 0-5 0 - 5 WBC/hpf   Squamous Epithelial / LPF 0-5 0 - 5   Mucus PRESENT   Magnesium     Status: Abnormal   Collection Time: 08/28/20  5:31 PM  Result Value Ref Range   Magnesium 1.5 (L) 1.7 - 2.4 mg/dL  Resp Panel by RT-PCR (Flu A&B, Covid) Nasopharyngeal Swab     Status: None   Collection Time: 08/28/20  6:39 PM   Specimen: Nasopharyngeal Swab; Nasopharyngeal(NP) swabs in vial transport medium  Result Value Ref Range   SARS Coronavirus 2 by RT PCR NEGATIVE NEGATIVE   Influenza A by PCR NEGATIVE NEGATIVE   Influenza B by PCR NEGATIVE NEGATIVE  Occult blood card to lab, stool Provider will collect     Status: None   Collection Time: 08/28/20  6:41 PM  Result Value Ref Range   Fecal Occult Bld NEGATIVE NEGATIVE  Basic metabolic panel     Status: Abnormal   Collection Time: 08/28/20  9:38 PM  Result Value Ref Range   Sodium 120 (L) 135 - 145 mmol/L   Potassium 2.6 (LL) 3.5 - 5.1 mmol/L   Chloride 83 (L) 98 - 111 mmol/L  CO2 21 (L) 22 - 32 mmol/L   Glucose, Bld 130 (H) 70 - 99 mg/dL   BUN 17 8 - 23 mg/dL   Creatinine, Ser 1.24 (H) 0.44 - 1.00 mg/dL   Calcium 8.6 (L) 8.9 - 10.3 mg/dL   GFR, Estimated 46 (L) >60 mL/min   Anion gap 16 (H) 5 - 15     Radiological Exams on Admission: CT ABDOMEN PELVIS WO CONTRAST  Result Date: 08/28/2020 CLINICAL DATA:  Abdominal pain, acute, nonlocalized EXAM: CT ABDOMEN AND PELVIS WITHOUT CONTRAST TECHNIQUE:  Multidetector CT imaging of the abdomen and pelvis was performed following the standard protocol without IV contrast. COMPARISON:  Radiograph 08/25/2020 FINDINGS: Lower chest: Lung bases are clear. No focal consolidation or pleural fluid. Small hiatal hernia. Hepatobiliary: No focal liver abnormality is seen. No gallstones, gallbladder wall thickening, or biliary dilatation. Pancreas: No ductal dilatation or inflammation. Spleen: Normal in size without focal abnormality. Adrenals/Urinary Tract: No adrenal nodule. Minimal left hydronephrosis. Left ureter is dilated to the pelvis. No renal or ureteral stone. No significant perinephric edema. No right hydronephrosis. No right renal stone. Decompressed right ureter. Urinary bladder is essentially empty and not well assessed. Stomach/Bowel: Bowel evaluation is limited in the absence of enteric contrast. There is wall thickening of the sigmoid colon with mild pericolonic edema. The mid sigmoid colon appears apposed to the uterus with obliteration of normal fat plane. Sigmoid colon is also difficult to delineate from a pelvic mass, which is also contiguous with the uterus. Please note that detailed assessment is limited on this noncontrast exam. There is a moderate to large volume of stool in the more proximal colon. No definite small bowel obstruction or inflammatory change. Appendix is normal. Small to moderate hiatal hernia. Vascular/Lymphatic: Multifocal adenopathy. Please note that detailed assessment of adenopathy is limited on this noncontrast exam. For instance, left periaortic node measures 16 mm short axis, series 2, image 30. Additional prominent and mildly enlarged lower retroperitoneal lymph nodes are seen. Right upper quadrant adenopathy with lymph nodes measuring 11 in 15 mm, series 2, image 36, likely in the mesentery. Right lower quadrant mesenteric node measures 13 mm, series 2, image 44. There additional enlarged ileocolic lymph nodes. 9 mm right  external iliac node. Rounded soft tissue mass about the left pelvic sidewall measures 2.3 x 3.3 cm, may be adnexal mass or iliac adenopathy. There is a 17 mm left external iliac node, series 2, image 58. Prominent bilateral inguinal nodes. Reproductive: Pelvic mass just to the left of midline measures 7.5 x 4.8 cm, and is contiguous with the sigmoid colon and uterus. The ovary is not definitively visualized. Right ovary is not definitively seen. Other: Mild fat stranding in the pelvis as well as presacral region. Minimal omental edema without obvious omental thickening or mass. No ascites. No free air. Musculoskeletal: No acute osseous abnormality or focal bone lesion. No subcutaneous abnormality. IMPRESSION: 1. Pelvic mass measuring 7.5 x 4.8 cm, contiguous with the sigmoid colon and uterus with loss of fat planes. Site of origin is unclear, may be ovarian, uterine, or colonic. Lack of contrast limits detailed assessment. 2. Above pelvic mass causes partial obstruction of the left ureter with mild left hydroureteronephrosis. 3. Multifocal adenopathy in the abdomen and pelvis, suspicious for metastatic disease, primarily in the mesentery and lower peritoneum and pelvis. Please note that detailed assessment of adenopathy is limited on this noncontrast exam. Recommend oncology referral. 4. Wall thickening of the sigmoid colon distal to the left pelvic mass with  pericolonic edema, suspicious for colitis. There is a moderate to large amount of stool in the more proximal colon, suggesting pelvic mass may be causing delayed colonic transit. 5. Small to moderate hiatal hernia. Electronically Signed   By: Keith Rake M.D.   On: 08/28/2020 18:30   CT ABDOMEN PELVIS WO CONTRAST  Final Result      Consults called:  Gyn/onc     Dwyane Dee, MD [facility] 08/28/2020, 11:03 PM

## 2020-08-29 DIAGNOSIS — E876 Hypokalemia: Secondary | ICD-10-CM

## 2020-08-29 DIAGNOSIS — R1900 Intra-abdominal and pelvic swelling, mass and lump, unspecified site: Secondary | ICD-10-CM | POA: Diagnosis not present

## 2020-08-29 DIAGNOSIS — N1330 Unspecified hydronephrosis: Secondary | ICD-10-CM

## 2020-08-29 DIAGNOSIS — R739 Hyperglycemia, unspecified: Secondary | ICD-10-CM

## 2020-08-29 DIAGNOSIS — K5900 Constipation, unspecified: Secondary | ICD-10-CM | POA: Diagnosis not present

## 2020-08-29 DIAGNOSIS — N179 Acute kidney failure, unspecified: Secondary | ICD-10-CM | POA: Diagnosis not present

## 2020-08-29 DIAGNOSIS — E440 Moderate protein-calorie malnutrition: Secondary | ICD-10-CM | POA: Insufficient documentation

## 2020-08-29 LAB — COMPREHENSIVE METABOLIC PANEL
ALT: 19 U/L (ref 0–44)
AST: 21 U/L (ref 15–41)
Albumin: 3 g/dL — ABNORMAL LOW (ref 3.5–5.0)
Alkaline Phosphatase: 94 U/L (ref 38–126)
Anion gap: 13 (ref 5–15)
BUN: 15 mg/dL (ref 8–23)
CO2: 21 mmol/L — ABNORMAL LOW (ref 22–32)
Calcium: 8.8 mg/dL — ABNORMAL LOW (ref 8.9–10.3)
Chloride: 86 mmol/L — ABNORMAL LOW (ref 98–111)
Creatinine, Ser: 1.2 mg/dL — ABNORMAL HIGH (ref 0.44–1.00)
GFR, Estimated: 48 mL/min — ABNORMAL LOW (ref >60)
Glucose, Bld: 136 mg/dL — ABNORMAL HIGH (ref 70–99)
Potassium: 4.2 mmol/L (ref 3.5–5.1)
Sodium: 120 mmol/L — ABNORMAL LOW (ref 135–145)
Total Bilirubin: 0.5 mg/dL (ref 0.3–1.2)
Total Protein: 7 g/dL (ref 6.5–8.1)

## 2020-08-29 LAB — CBC WITH DIFFERENTIAL/PLATELET
Abs Immature Granulocytes: 0.34 10*3/uL — ABNORMAL HIGH (ref 0.00–0.07)
Basophils Absolute: 0 10*3/uL (ref 0.0–0.1)
Basophils Relative: 0 %
Eosinophils Absolute: 0.1 10*3/uL (ref 0.0–0.5)
Eosinophils Relative: 1 %
HCT: 27.2 % — ABNORMAL LOW (ref 36.0–46.0)
Hemoglobin: 8.8 g/dL — ABNORMAL LOW (ref 12.0–15.0)
Immature Granulocytes: 3 %
Lymphocytes Relative: 6 %
Lymphs Abs: 0.8 10*3/uL (ref 0.7–4.0)
MCH: 21.2 pg — ABNORMAL LOW (ref 26.0–34.0)
MCHC: 32.4 g/dL (ref 30.0–36.0)
MCV: 65.5 fL — ABNORMAL LOW (ref 80.0–100.0)
Monocytes Absolute: 1 10*3/uL (ref 0.1–1.0)
Monocytes Relative: 7 %
Neutro Abs: 11.4 10*3/uL — ABNORMAL HIGH (ref 1.7–7.7)
Neutrophils Relative %: 83 %
Platelets: 425 10*3/uL — ABNORMAL HIGH (ref 150–400)
RBC: 4.15 MIL/uL (ref 3.87–5.11)
RDW: 13.2 % (ref 11.5–15.5)
WBC: 13.6 10*3/uL — ABNORMAL HIGH (ref 4.0–10.5)
nRBC: 0 % (ref 0.0–0.2)

## 2020-08-29 LAB — BASIC METABOLIC PANEL
Anion gap: 10 (ref 5–15)
BUN: 16 mg/dL (ref 8–23)
CO2: 23 mmol/L (ref 22–32)
Calcium: 8.6 mg/dL — ABNORMAL LOW (ref 8.9–10.3)
Chloride: 86 mmol/L — ABNORMAL LOW (ref 98–111)
Creatinine, Ser: 1.28 mg/dL — ABNORMAL HIGH (ref 0.44–1.00)
GFR, Estimated: 44 mL/min — ABNORMAL LOW (ref >60)
Glucose, Bld: 125 mg/dL — ABNORMAL HIGH (ref 70–99)
Potassium: 4 mmol/L (ref 3.5–5.1)
Sodium: 119 mmol/L — CL (ref 135–145)

## 2020-08-29 LAB — OSMOLALITY: Osmolality: 247 mOsm/kg — CL (ref 275–295)

## 2020-08-29 LAB — PHOSPHORUS: Phosphorus: 1.8 mg/dL — ABNORMAL LOW (ref 2.5–4.6)

## 2020-08-29 LAB — GLUCOSE, CAPILLARY
Glucose-Capillary: 167 mg/dL — ABNORMAL HIGH (ref 70–99)
Glucose-Capillary: 135 mg/dL — ABNORMAL HIGH (ref 70–99)
Glucose-Capillary: 113 mg/dL — ABNORMAL HIGH (ref 70–99)

## 2020-08-29 LAB — HEMOGLOBIN A1C
Hgb A1c MFr Bld: 7 % — ABNORMAL HIGH (ref 4.8–5.6)
Mean Plasma Glucose: 154.2 mg/dL

## 2020-08-29 LAB — MAGNESIUM: Magnesium: 2.2 mg/dL (ref 1.7–2.4)

## 2020-08-29 MED ORDER — SENNOSIDES-DOCUSATE SODIUM 8.6-50 MG PO TABS
1.0000 | ORAL_TABLET | Freq: Two times a day (BID) | ORAL | Status: DC
  Administered 2020-08-29 – 2020-09-04 (×8): 1 via ORAL
  Filled 2020-08-29 (×9): qty 1

## 2020-08-29 MED ORDER — POTASSIUM & SODIUM PHOSPHATES 280-160-250 MG PO PACK: 1.0000 | PACK | Freq: Three times a day (TID) | ORAL | Status: DC

## 2020-08-29 MED ORDER — POLYETHYLENE GLYCOL 3350 17 G PO PACK
17.0000 g | PACK | Freq: Two times a day (BID) | ORAL | Status: DC
  Administered 2020-08-29 (×2): 17 g via ORAL
  Filled 2020-08-29 (×3): qty 1

## 2020-08-29 MED ORDER — POTASSIUM PHOSPHATES 15 MMOLE/5ML IV SOLN
30.0000 mmol | Freq: Once | INTRAVENOUS | Status: AC
  Administered 2020-08-29: 30 mmol via INTRAVENOUS
  Filled 2020-08-29: qty 10

## 2020-08-29 MED ORDER — LIP MEDEX EX OINT
TOPICAL_OINTMENT | CUTANEOUS | Status: AC
  Filled 2020-08-29: qty 7

## 2020-08-29 MED ORDER — HEPARIN SODIUM (PORCINE) 5000 UNIT/ML IJ SOLN
5000.0000 [IU] | Freq: Three times a day (TID) | INTRAMUSCULAR | Status: DC
  Administered 2020-08-30 – 2020-09-04 (×12): 5000 [IU] via SUBCUTANEOUS
  Filled 2020-08-29 (×12): qty 1

## 2020-08-29 NOTE — Progress Notes (Signed)
PROGRESS NOTE    Teresa Oliver  EHO:[id number] DOB: [birth year] DOA: 08/28/2020 PCP: Jani Gravel, MD  Brief Narrative:  The patient is a 75 year old Caucasian female with past medical history significant significant for but not limited to hypertension, hyperlipidemia, irritable bowel syndrome who presented to the [facility] as referral from GI after being found to have significant electrolyte derangement on outpatient lab work-up.  She has been complain about decreased urinary stream, constipation and decreased appetite and weight loss for last few weeks.  She underwent a CT of the abdomen pelvis which showed a pelvic mass which was 7 x 5 x 4 point centimeters contiguous with the sigmoid colon uterus.  There is also a partial obstructing left ureter with mild left hydroureteronephrosis and multiple focal adenopathy in the abdomen pelvis suspicious for metastatic disease.  She also had a large amount of stool in the proximal colon suggesting the pelvic mass may be causing delayed colonic transit.  She is transferred to [facility] for further electrolyte replacement as well as evaluation by gynecology oncology, general surgery as well as urology.  Her last bowel movement was on Saturday and she denies any dysuria.  Her appetite has been significantly decreased as she is noted that she been doubling her food for the past several weeks.  She states that her weight in February 121 on current admission was 113 pounds.  Has no history of cancer and last colonoscopy was 10 years ago.  She is being further evaluated for her pelvic mass and IR has been consulted for possible pelvic wall adenopathy biopsy.  Assessment & Plan:   Principal Problem:   Pelvic mass Active Problems:   Hydroureteronephrosis   Hypokalemia   Hypomagnesemia   Hyponatremia   Hyperglycemia   Constipation   AKI (acute kidney injury) ([place])  Pelvic Mass -She had a CT Abd/Pelvis that showed "Pelvic mass measuring 7.5  x 4.8 cm, contiguous with the sigmoid colon and uterus with loss of fat planes. Site of origin is unclear, may be ovarian, uterine, or colonic. Lack of contrast limits detailed assessment. 2. Above pelvic mass causes partial obstruction of the left ureter with mild left hydroureteronephrosis. 3. Multifocal adenopathy in the abdomen and pelvis, suspicious for metastatic disease, primarily in the mesentery and lower peritoneum and pelvis. Please note that detailed assessment of adenopathy is limited on this noncontrast exam. Recommend oncology referral. 4. Wall thickening of the sigmoid colon distal to the left pelvic  mass with pericolonic edema, suspicious for colitis. There is a moderate to large amount of stool in the more proximal colon, suggesting pelvic mass may be causing delayed colonic transit. 5. Small to moderate hiatal hernia." -Gynecologic Oncology evaluating commending chest imaging, tumor markers with the CEA, CA-125, CA 19 as well as an IR biopsy of the mass on the left pelvic sidewall that was measuring 3.3 cm  -They have also recommended a general surgery consultation possibly and we have consulted them and they will see the patient; -Gyn-Onc feels that based on clinical presentation and CT findings there is concern for metastatic colon cancer -IR Consulted for Bx -C/w Scheduled Laxative -WBC went from 15.6 -> 13.6 -C/w Supportive Care and with Antiemetics and Pain Control  -May Need Nephrology involvement   HypoNatremia -Likely multifactorial -Could be in the setting of decreased p.o. intake for weeks associated with hypovolemia but she may have SIADH component in the setting of suspected malignancy -Sodium on admission was 117 and trended up to 120 now  119 -Continue with LR at 50 mL's per hour -Serum osmolality was 247 -Checking Fina and urine osmolality -Urinalysis was as below  -needed TSH  Hyperglycemia in the setting of new onset diabetes 2 diabetes mellitus -Reported that  previously this year she was told that she is "prediabetic" -Check hemoglobin A1c and hemoglobin A1c showed it was 7.0 -CBGs ranged from 135-167 -Continue to monitor blood sugars carefully and if necessary will place on sensitive NovoLog sliding scale insulin  Suspected AKI -Baseline creatinine unknown as prior labs are not available for comparison but she denies any prior renal dysfunction -Patient's BUNs/creatinine went from 18/1.22 -> 17/1.24 -> 16/1.28 -Avoid nephrotoxic medications, contrast dyes, hypotension renally dose medications  -Started the patient on LR at 50 MLS per hour  -Urinalysis done on admission showed a yellow color urine, negative hemoglobin, negative ketones, negative leukocytes, negative nitrites, present mucus, 0-5 squamous epithelial cells, 0-5 RBCs per high-power field, 0-5 WBCs -Urine Osmolality was 247 -Repeat CMP in a.m.  Left Hydroureteronephrosis -On the CT scan there was minimal left hydronephrosis and the left ureter was dilated to the pelvis.  There is no renal or ureteral stone and no significant perinephric edema.  Note right-sided ureter hydronephrosis.  Abdominal mass was causing partial obstruction of the left ureter -Urology consulted for further evaluation -Continue with IV fluid hydration with LR at 50 MLS per hour -Strict I's and O's -Unclear if she will need to ureteral stent given the mass so will defer to urology  Constipation -Recent KUB done on 08/25/2020 showed "Normal abdominal gas pattern. Moderate stool throughout the colon. No free intraperitoneal gas. No organomegaly. No abnormal calcifications within the abdomen and pelvis. No acute bone abnormality." -CT Abdomen/Pelvis showed "Wall thickening of the sigmoid colon distal to the left pelvic mass with pericolonic edema, suspicious for colitis. There is a moderate to large amount of stool in the more proximal colon, suggesting pelvic mass may be causing delayed colonic transit." -Initiated on  Miralax 17 grams daily but will adjust and make it BID and add Senna-Docusate 1 tab po BID -If necessary will add Bisacodyl Suppository 10 mg RC and possible Enema -C/w LR at 50 mL/hr   Hypokalemia -Patient's potassium on admission was 2.6 and after repletion improved to 4.0 this morning -Continue to monitor and replete as necessary -Repeat CMP in a.m.  Hypophosphatemia -Patient's phosphorus level was 1.8 -Replete with IV K-Phos 30 mmol -Continue to monitor and replete as necessary -Repeat phosphorus level in a.m.  Hypomagnesemia  -Patient magnesium level on admission was 1.5 and repeat this AM was 2.2 -Replete with IV mag sulfate 2 g yesterday -Continue monitor replete as necessary -Repeat magnesium level in a.m.  Thrombocytosis -Patient's platelet count went from 435 and now 425 -Continue monitor and trend and repeat CBC in a.m.  Microcytic Anemia -Likely anemia of chronic disease and possible malignancy -Patient's hemoglobin/hematocrit went from 8.7/26.1 is now 8.8/27.2 -Check anemia panel in a.m. -Continue to monitor for signs and symptoms of bleeding; currently no overt bleeding noted -Repeat CBC in a.m.  Non-severe (Moderate) Malnutrition in the Context of Chronic Illness -Nutritionist consulted for further evaluation and recommendations -Once Diet is advanced Nutritionist recommending ordering [employer] 1.4 p.o. twice daily as well as Magic cup 3 times daily with meals  DVT prophylaxis: Heparin 5,000 units sq q8h Code Status: FULL CODE Family Communication: Discussed with Husband and Daughter at bedside  Disposition Plan: Pending further clinical work-up and evaluation by specialists  Status is: Inpatient  Remains inpatient appropriate because:Unsafe d/c plan, IV treatments appropriate due to intensity of illness or inability to take PO and Inpatient level of care appropriate due to severity of illness   Dispo: The patient is from: Home               Anticipated d/c is to: TBD              Patient currently is not medically stable to d/c.   Difficult to place patient No  Consultants:   Gyn-Onc  Urology   General Surgery  IR   Procedures: None   Antimicrobials:  Anti-infectives (From admission, onward)   None        Subjective: Seen and examined at bedside and states that she did not have much of an appetite and has been having looser stools.  No lightheadedness or dizziness.  No other concerns or complaints at this time  Objective: Vitals:   08/28/20 2300 08/29/20 0209 08/29/20 0618 08/29/20 0941  BP: (!) 148/81 (!) 162/91 (!) 152/91 (!) 167/94  Pulse: 91 93 91 83  Resp: 20 20 19 15   Temp: 98.4 F (36.9 C) 97.8 F (36.6 C) 98 F (36.7 C)   TempSrc: Oral Oral Oral   SpO2: 99% 97% 98% 100%  Weight:      Height:        Intake/Output Summary (Last 24 hours) at 08/29/2020 1201 Last data filed at 08/29/2020 0400 Gross per 24 hour  Intake 390.55 ml  Output --  Net 390.55 ml   Filed Weights   08/28/20 1629  Weight: 51.1 kg   Examination: Physical Exam:  Constitutional: WN/WD elderly slightly chronically ill-appearing Caucasian female currently in no acute distress appears calm Eyes: Lids and conjunctivae normal, sclerae anicteric  ENMT: External Ears, Nose appear normal. Grossly normal hearing. Neck: Appears normal, supple, no cervical masses, normal ROM, no appreciable thyromegaly; no JVD Respiratory: Diminished to auscultation bilaterally, no wheezing, rales, rhonchi or crackles. Normal respiratory effort and patient is not tachypenic. No accessory muscle use.  Unlabored breathing Cardiovascular: RRR, no murmurs / rubs / gallops. S1 and S2 auscultated.  Abdomen: Soft, non-tender, non-distended. Bowel sounds positive.  GU: Deferred. Musculoskeletal: No clubbing / cyanosis of digits/nails. No joint deformity upper and lower extremities.  Skin: No rashes, lesions, ulcers on limited skin evaluation. No  induration; Warm and dry.  Neurologic: CN 2-12 grossly intact with no focal deficits. Romberg sign and cerebellar reflexes not assessed.  Psychiatric: Normal judgment and insight. Alert and oriented x 3. Normal mood and appropriate affect.   Data Reviewed: I have personally reviewed following labs and imaging studies  CBC: Recent Labs  Lab 08/28/20 1648 08/29/20 0538  WBC 15.6* 13.6*  NEUTROABS 12.7* 11.4*  HGB 8.7* 8.8*  HCT 26.1* 27.2*  MCV 63.7* 65.5*  PLT 435* 865*   Basic Metabolic Panel: Recent Labs  Lab 08/28/20 1648 08/28/20 1731 08/28/20 2138 08/29/20 0538  NA 117*  --  120* 119*  K 2.6*  --  2.6* 4.0  CL 80*  --  83* 86*  CO2 21*  --  21* 23  GLUCOSE 135*  --  130* 125*  BUN 18  --  17 16  CREATININE 1.22*  --  1.24* 1.28*  CALCIUM 8.8*  --  8.6* 8.6*  MG  --  1.5*  --  2.2  PHOS  --   --   --  1.8*   GFR: Estimated Creatinine Clearance: 27.7 mL/min (A) (by C-G  formula based on SCr of 1.28 mg/dL (H)). Liver Function Tests: Recent Labs  Lab 08/28/20 1648  AST 20  ALT 17  ALKPHOS 85  BILITOT 0.5  PROT 6.9  ALBUMIN 3.6   Recent Labs  Lab 08/28/20 1648  LIPASE 24   No results for input(s): AMMONIA in the last 168 hours. Coagulation Profile: No results for input(s): INR, PROTIME in the last 168 hours. Cardiac Enzymes: No results for input(s): CKTOTAL, CKMB, CKMBINDEX, TROPONINI in the last 168 hours. BNP (last 3 results) No results for input(s): PROBNP in the last 8760 hours. HbA1C: Recent Labs    08/29/20 0538  HGBA1C 7.0*   CBG: Recent Labs  Lab 08/29/20 0103 08/29/20 1156  GLUCAP 167* 135*   Lipid Profile: No results for input(s): CHOL, HDL, LDLCALC, TRIG, CHOLHDL, LDLDIRECT in the last 72 hours. Thyroid Function Tests: No results for input(s): TSH, T4TOTAL, FREET4, T3FREE, THYROIDAB in the last 72 hours. Anemia Panel: No results for input(s): VITAMINB12, FOLATE, FERRITIN, TIBC, IRON, RETICCTPCT in the last 72 hours. Sepsis  Labs: No results for input(s): PROCALCITON, LATICACIDVEN in the last 168 hours.  Recent Results (from the past 240 hour(s))  Resp Panel by RT-PCR (Flu A&B, Covid) Nasopharyngeal Swab     Status: None   Collection Time: 08/28/20  6:39 PM   Specimen: Nasopharyngeal Swab; Nasopharyngeal(NP) swabs in vial transport medium  Result Value Ref Range Status   SARS Coronavirus 2 by RT PCR NEGATIVE NEGATIVE Final    Comment: (NOTE) SARS-CoV-2 target nucleic acids are NOT DETECTED.  The SARS-CoV-2 RNA is generally detectable in upper respiratory specimens during the acute phase of infection. The lowest concentration of SARS-CoV-2 viral copies this assay can detect is 138 copies/mL. A negative result does not preclude SARS-Cov-2 infection and should not be used as the sole basis for treatment or other patient management decisions. A negative result may occur with  improper specimen collection/handling, submission of specimen other than nasopharyngeal swab, presence of viral mutation(s) within the areas targeted by this assay, and inadequate number of viral copies(<138 copies/mL). A negative result must be combined with clinical observations, patient history, and epidemiological information. The expected result is Negative.  Fact Sheet for Patients:  EntrepreneurPulse.com.au  Fact Sheet for Healthcare Providers:  IncredibleEmployment.be  This test is no t yet approved or cleared by the [place] FDA and  has been authorized for detection and/or diagnosis of SARS-CoV-2 by FDA under an Emergency Use Authorization (EUA). This EUA will remain  in effect (meaning this test can be used) for the duration of the COVID-19 declaration under Section 564(b)(1) of the Act, 21 U.S.C.section 360bbb-3(b)(1), unless the authorization is terminated  or revoked sooner.       Influenza A by PCR NEGATIVE NEGATIVE Final   Influenza B by PCR NEGATIVE NEGATIVE Final     Comment: (NOTE) The Xpert Xpress SARS-CoV-2/FLU/RSV plus assay is intended as an aid in the diagnosis of influenza from Nasopharyngeal swab specimens and should not be used as a sole basis for treatment. Nasal washings and aspirates are unacceptable for Xpert Xpress SARS-CoV-2/FLU/RSV testing.  Fact Sheet for Patients: EntrepreneurPulse.com.au  Fact Sheet for Healthcare Providers: IncredibleEmployment.be  This test is not yet approved or cleared by the [place] FDA and has been authorized for detection and/or diagnosis of SARS-CoV-2 by FDA under an Emergency Use Authorization (EUA). This EUA will remain in effect (meaning this test can be used) for the duration of the COVID-19 declaration under Section 564(b)(1) of the  Act, 21 U.S.C. section 360bbb-3(b)(1), unless the authorization is terminated or revoked.  Performed at [facility]      RN Pressure Injury Documentation:     Estimated body mass index is 22.01 kg/m as calculated from the following:   Height as of this encounter: 5' (1.524 m).   Weight as of this encounter: 51.1 kg.  Malnutrition Type:  Nutrition Problem: Moderate Malnutrition Etiology: chronic illness  Malnutrition Characteristics:  Signs/Symptoms: percent weight loss,energy intake < or equal to 75% for > or equal to 1 month,mild fat depletion,mild muscle depletion   Nutrition Interventions:  Interventions: Refer to RD note for recommendations   Radiology Studies: CT ABDOMEN PELVIS WO CONTRAST  Result Date: 08/28/2020 CLINICAL DATA:  Abdominal pain, acute, nonlocalized EXAM: CT ABDOMEN AND PELVIS WITHOUT CONTRAST TECHNIQUE: Multidetector CT imaging of the abdomen and pelvis was performed following the standard protocol without IV contrast. COMPARISON:  Radiograph 08/25/2020 FINDINGS: Lower chest: Lung bases are clear. No focal consolidation or pleural fluid. Small hiatal hernia. Hepatobiliary:  No focal liver abnormality is seen. No gallstones, gallbladder wall thickening, or biliary dilatation. Pancreas: No ductal dilatation or inflammation. Spleen: Normal in size without focal abnormality. Adrenals/Urinary Tract: No adrenal nodule. Minimal left hydronephrosis. Left ureter is dilated to the pelvis. No renal or ureteral stone. No significant perinephric edema. No right hydronephrosis. No right renal stone. Decompressed right ureter. Urinary bladder is essentially empty and not well assessed. Stomach/Bowel: Bowel evaluation is limited in the absence of enteric contrast. There is wall thickening of the sigmoid colon with mild pericolonic edema. The mid sigmoid colon appears apposed to the uterus with obliteration of normal fat plane. Sigmoid colon is also difficult to delineate from a pelvic mass, which is also contiguous with the uterus. Please note that detailed assessment is limited on this noncontrast exam. There is a moderate to large volume of stool in the more proximal colon. No definite small bowel obstruction or inflammatory change. Appendix is normal. Small to moderate hiatal hernia. Vascular/Lymphatic: Multifocal adenopathy. Please note that detailed assessment of adenopathy is limited on this noncontrast exam. For instance, left periaortic node measures 16 mm short axis, series 2, image 30. Additional prominent and mildly enlarged lower retroperitoneal lymph nodes are seen. Right upper quadrant adenopathy with lymph nodes measuring 11 in 15 mm, series 2, image 36, likely in the mesentery. Right lower quadrant mesenteric node measures 13 mm, series 2, image 44. There additional enlarged ileocolic lymph nodes. 9 mm right external iliac node. Rounded soft tissue mass about the left pelvic sidewall measures 2.3 x 3.3 cm, may be adnexal mass or iliac adenopathy. There is a 17 mm left external iliac node, series 2, image 58. Prominent bilateral inguinal nodes. Reproductive: Pelvic mass just to the left  of midline measures 7.5 x 4.8 cm, and is contiguous with the sigmoid colon and uterus. The ovary is not definitively visualized. Right ovary is not definitively seen. Other: Mild fat stranding in the pelvis as well as presacral region. Minimal omental edema without obvious omental thickening or mass. No ascites. No free air. Musculoskeletal: No acute osseous abnormality or focal bone lesion. No subcutaneous abnormality. IMPRESSION: 1. Pelvic mass measuring 7.5 x 4.8 cm, contiguous with the sigmoid colon and uterus with loss of fat planes. Site of origin is unclear, may be ovarian, uterine, or colonic. Lack of contrast limits detailed assessment. 2. Above pelvic mass causes partial obstruction of the left ureter with mild left hydroureteronephrosis. 3. Multifocal adenopathy in the  abdomen and pelvis, suspicious for metastatic disease, primarily in the mesentery and lower peritoneum and pelvis. Please note that detailed assessment of adenopathy is limited on this noncontrast exam. Recommend oncology referral. 4. Wall thickening of the sigmoid colon distal to the left pelvic mass with pericolonic edema, suspicious for colitis. There is a moderate to large amount of stool in the more proximal colon, suggesting pelvic mass may be causing delayed colonic transit. 5. Small to moderate hiatal hernia. Electronically Signed   By: Keith Rake M.D.   On: 08/28/2020 18:30   Scheduled Meds: . heparin  5,000 Units Subcutaneous Q8H  . lip balm      . sodium chloride flush  3 mL Intravenous Q12H   Continuous Infusions: . lactated ringers 50 mL/hr at 08/28/20 2337  . potassium PHOSPHATE IVPB (in mmol) 30 mmol (08/29/20 0947)    LOS: 1 day   Kerney Elbe, DO [facility] PAGER is on [place]  If 7PM-7AM, please contact night-coverage [email or web address]

## 2020-08-29 NOTE — Consult Note (Signed)
Chief Complaint: Patient was seen in consultation today for image guided biopsy of left pelvic sidewall mass  Referring Physician(s): [place]  Supervising Physician: Arne Cleveland  Patient Status: [facility] - In-pt  History of Present Illness: Teresa Oliver is a 75 y.o. female with past medical history of anemia, hyperlipidemia, hypertension, and IBS who was admitted to [facility] on 4/11 following findings of electrolyte abnormalities on outpatient lab work along with complaints of decreased urinary stream, constipation, diminished appetite and weight loss.  She has no prior history of cancer and her last colonoscopy was 10 years ago.  CT of the abdomen pelvis on 4/11 revealed: 1. Pelvic mass measuring 7.5 x 4.8 cm, contiguous with the sigmoid colon and uterus with loss of fat planes. Site of origin is unclear, may be ovarian, uterine, or colonic. Lack of contrast limits detailed assessment. 2. Above pelvic mass causes partial obstruction of the left ureter with mild left hydroureteronephrosis. 3. Multifocal adenopathy in the abdomen and pelvis, suspicious for metastatic disease, primarily in the mesentery and lower peritoneum and pelvis. Please note that detailed assessment of adenopathy is limited on this noncontrast exam. Recommend oncology referral. 4. Wall thickening of the sigmoid colon distal to the left pelvic mass with pericolonic edema, suspicious for colitis. There is a moderate to large amount of stool in the more proximal colon, suggesting pelvic mass may be causing delayed colonic transit. 5. Small to moderate hiatal hernia.  Current labs include WBC 13.6, hemoglobin 8.8, platelets 425K, potassium 4.2, creatinine 1.20, PT/INR pending.  COVID-19 negative.  She is afebrile.  Request now received from gyn/onc for image guided biopsy of left pelvic sidewall mass.   Past Medical History:  Diagnosis Date  . Anemia    chronic  . Hyperlipemia   .  Hypertension   . Irritable bowel syndrome (IBS)     Past Surgical History:  Procedure Laterality Date  . BREAST EXCISIONAL BIOPSY Left    1985 benign  . HERNIA REPAIR      Allergies: Patient has no known allergies.  Medications: Prior to Admission medications   Medication Sig Start Date End Date Taking? Authorizing Provider  cholecalciferol (VITAMIN D3) 25 MCG (1000 UNIT) tablet Take 1,000 Units by mouth daily.   Yes [provider]  Ferrous Sulfate (IRON PO) Take 1 tablet by mouth daily.   Yes [provider]  losartan-hydrochlorothiazide (HYZAAR) 100-25 MG tablet Take 1 tablet by mouth daily. 06/20/20  Yes [provider]  pantoprazole (PROTONIX) 40 MG tablet Take 1 tablet by mouth every morning. 08/25/20  Yes [provider]  pravastatin (PRAVACHOL) 80 MG tablet Take 80 mg by mouth every evening. 06/19/20  Yes [provider]     Family History  Problem Relation Age of Onset  . Stomach cancer Mother   . Dementia Mother   . Heart attack Father   . Prostate cancer Brother     Social History   Socioeconomic History  . Marital status: Married    Spouse name: Not on file  . Number of children: Not on file  . Years of education: Not on file  . Highest education level: Not on file  Occupational History  . Not on file  Tobacco Use  . Smoking status: Never Smoker  . Smokeless tobacco: Never Used  Substance and Sexual Activity  . Alcohol use: Not Currently  . Drug use: Not Currently  . Sexual activity: Not on file  Other Topics Concern  . Not  on file  Social History Narrative  . Not on file   Social Determinants of Health   Financial Resource Strain: Not on file  Food Insecurity: Not on file  Transportation Needs: Not on file  Physical Activity: Not on file  Stress: Not on file  Social Connections: Not on file      Review of Systems : currently denies fever,HA,CP, dyspnea, cough, back pain,N/V ; does have weight loss,  decreased appetite, pelvic discomfort/bloating as well as some small amounts of blood on toilet tissue recently  Vital Signs: BP (!) 156/92 (BP Location: Right Arm)   Pulse 90   Temp 98.4 F (36.9 C) (Oral)   Resp 15   Ht 5' (1.524 m)   Wt 112 lb 11.2 oz (51.1 kg)   SpO2 100%   BMI 22.01 kg/m   Physical Exam awake/alert; chest- CTA bilat; heart- RRR; abd- soft, few BS, mildy tender ant pelvic region; no LE edema  Imaging: CT ABDOMEN PELVIS WO CONTRAST  Result Date: 08/28/2020 CLINICAL DATA:  Abdominal pain, acute, nonlocalized EXAM: CT ABDOMEN AND PELVIS WITHOUT CONTRAST TECHNIQUE: Multidetector CT imaging of the abdomen and pelvis was performed following the standard protocol without IV contrast. COMPARISON:  Radiograph 08/25/2020 FINDINGS: Lower chest: Lung bases are clear. No focal consolidation or pleural fluid. Small hiatal hernia. Hepatobiliary: No focal liver abnormality is seen. No gallstones, gallbladder wall thickening, or biliary dilatation. Pancreas: No ductal dilatation or inflammation. Spleen: Normal in size without focal abnormality. Adrenals/Urinary Tract: No adrenal nodule. Minimal left hydronephrosis. Left ureter is dilated to the pelvis. No renal or ureteral stone. No significant perinephric edema. No right hydronephrosis. No right renal stone. Decompressed right ureter. Urinary bladder is essentially empty and not well assessed. Stomach/Bowel: Bowel evaluation is limited in the absence of enteric contrast. There is wall thickening of the sigmoid colon with mild pericolonic edema. The mid sigmoid colon appears apposed to the uterus with obliteration of normal fat plane. Sigmoid colon is also difficult to delineate from a pelvic mass, which is also contiguous with the uterus. Please note that detailed assessment is limited on this noncontrast exam. There is a moderate to large volume of stool in the more proximal colon. No definite small bowel obstruction or inflammatory change.  Appendix is normal. Small to moderate hiatal hernia. Vascular/Lymphatic: Multifocal adenopathy. Please note that detailed assessment of adenopathy is limited on this noncontrast exam. For instance, left periaortic node measures 16 mm short axis, series 2, image 30. Additional prominent and mildly enlarged lower retroperitoneal lymph nodes are seen. Right upper quadrant adenopathy with lymph nodes measuring 11 in 15 mm, series 2, image 36, likely in the mesentery. Right lower quadrant mesenteric node measures 13 mm, series 2, image 44. There additional enlarged ileocolic lymph nodes. 9 mm right external iliac node. Rounded soft tissue mass about the left pelvic sidewall measures 2.3 x 3.3 cm, may be adnexal mass or iliac adenopathy. There is a 17 mm left external iliac node, series 2, image 58. Prominent bilateral inguinal nodes. Reproductive: Pelvic mass just to the left of midline measures 7.5 x 4.8 cm, and is contiguous with the sigmoid colon and uterus. The ovary is not definitively visualized. Right ovary is not definitively seen. Other: Mild fat stranding in the pelvis as well as presacral region. Minimal omental edema without obvious omental thickening or mass. No ascites. No free air. Musculoskeletal: No acute osseous abnormality or focal bone lesion. No subcutaneous abnormality. IMPRESSION: 1. Pelvic mass measuring 7.5 x 4.8  cm, contiguous with the sigmoid colon and uterus with loss of fat planes. Site of origin is unclear, may be ovarian, uterine, or colonic. Lack of contrast limits detailed assessment. 2. Above pelvic mass causes partial obstruction of the left ureter with mild left hydroureteronephrosis. 3. Multifocal adenopathy in the abdomen and pelvis, suspicious for metastatic disease, primarily in the mesentery and lower peritoneum and pelvis. Please note that detailed assessment of adenopathy is limited on this noncontrast exam. Recommend oncology referral. 4. Wall thickening of the sigmoid colon  distal to the left pelvic mass with pericolonic edema, suspicious for colitis. There is a moderate to large amount of stool in the more proximal colon, suggesting pelvic mass may be causing delayed colonic transit. 5. Small to moderate hiatal hernia. Electronically Signed   By: Keith Rake M.D.   On: 08/28/2020 18:30   DG Abd 2 Views  Result Date: 08/27/2020 CLINICAL DATA:  Change in bowel habits EXAM: ABDOMEN - 2 VIEW COMPARISON:  None. FINDINGS: Normal abdominal gas pattern. Moderate stool throughout the colon. No free intraperitoneal gas. No organomegaly. No abnormal calcifications within the abdomen and pelvis. No acute bone abnormality. IMPRESSION: Normal abdominal gas pattern.  Moderate stool. Electronically Signed   By: Fidela Salisbury MD   On: 08/27/2020 23:48    Labs:  CBC: Recent Labs    08/28/20 1648 08/29/20 0538  WBC 15.6* 13.6*  HGB 8.7* 8.8*  HCT 26.1* 27.2*  PLT 435* 425*    COAGS: No results for input(s): INR, APTT in the last 8760 hours.  BMP: Recent Labs    08/28/20 1648 08/28/20 2138 08/29/20 0538  NA 117* 120* 119*  K 2.6* 2.6* 4.0  CL 80* 83* 86*  CO2 21* 21* 23  GLUCOSE 135* 130* 125*  BUN 18 17 16   CALCIUM 8.8* 8.6* 8.6*  CREATININE 1.22* 1.24* 1.28*  GFRNONAA 47* 46* 44*    LIVER FUNCTION TESTS: Recent Labs    08/28/20 1648  BILITOT 0.5  AST 20  ALT 17  ALKPHOS 85  PROT 6.9  ALBUMIN 3.6    TUMOR MARKERS: No results for input(s): AFPTM, CEA, CA199, CHROMGRNA in the last 8760 hours.  Assessment and Plan: 75 y.o. female with past medical history of anemia, hyperlipidemia, hypertension, and IBS who was admitted to [facility] on 4/11 following findings of electrolyte abnormalities on outpatient lab work along with complaints of decreased urinary stream, constipation, diminished appetite and weight loss.  She has no prior history of cancer and her last colonoscopy was 10 years ago.  CT of the abdomen pelvis on 4/11 revealed: 1.  Pelvic mass measuring 7.5 x 4.8 cm, contiguous with the sigmoid colon and uterus with loss of fat planes. Site of origin is unclear, may be ovarian, uterine, or colonic. Lack of contrast limits detailed assessment. 2. Above pelvic mass causes partial obstruction of the left ureter with mild left hydroureteronephrosis. 3. Multifocal adenopathy in the abdomen and pelvis, suspicious for metastatic disease, primarily in the mesentery and lower peritoneum and pelvis. Please note that detailed assessment of adenopathy is limited on this noncontrast exam. Recommend oncology referral. 4. Wall thickening of the sigmoid colon distal to the left pelvic mass with pericolonic edema, suspicious for colitis. There is a moderate to large amount of stool in the more proximal colon, suggesting pelvic mass may be causing delayed colonic transit. 5. Small to moderate hiatal hernia.  Current labs include WBC 13.6, hemoglobin 8.8, platelets 425K, potassium 4.2, creatinine 1.20, PT/INR pending.  COVID-19 negative.  She is afebrile.  Request now received from gyn/onc for image guided biopsy of left pelvic sidewall mass.  Imaging studies have been reviewed by Dr. Laurence Ferrari.Risks and benefits of procedure was discussed with the patient  including, but not limited to bleeding, infection, damage to adjacent structures or low yield requiring additional tests.  All of the questions were answered and there is agreement to proceed.  Consent signed and in chart.     Thank you for this interesting consult.  I greatly enjoyed meeting Teresa Oliver and look forward to participating in their care.  A copy of this report was sent to the requesting provider on this date.  Electronically Signed: D. Rowe Robert, PA-C 08/29/2020, 1:58 PM   I spent a total of 25 minutes    in face to face in clinical consultation, greater than 50% of which was counseling/coordinating care for image guided biopsy of left pelvic sidewall  mass

## 2020-08-29 NOTE — Progress Notes (Signed)
Initial Nutrition Assessment  DOCUMENTATION CODES:   Non-severe (moderate) malnutrition in context of chronic illness  INTERVENTION:   Once diet advanced: -Will order [employer] 1.4 po BID, each supplement provides 455 kcal and 20 grams protein.  -Magic cup TID with meals, each supplement provides 290 kcal and 9 grams of protein  NUTRITION DIAGNOSIS:   Moderate Malnutrition related to chronic illness as evidenced by percent weight loss,energy intake < or equal to 75% for > or equal to 1 month,mild fat depletion,mild muscle depletion.  GOAL:   Patient will meet greater than or equal to 90% of their needs  MONITOR:   PO intake,Supplement acceptance,Diet advancement,Labs,Weight trends,I & O's  REASON FOR ASSESSMENT:   Malnutrition Screening Tool    ASSESSMENT:   75 yo female with PMH HTN, HLD, IBS who initially presented to [facility] from referral per GI after being found to have electrolyte derangements on outpatient lab work-up.   She also had been complaining of decreased urinary stream, constipation, decreased appetite/weight loss.  She underwent CT abdomen/pelvis which showed a pelvic mass measuring 7.5 x 4.8 cm, contiguous with the sigmoid colon and uterus.  There was also partial obstruction of the left ureter with mild left hydroureteronephrosis.  Patient in room with family at bedside. Pt very pleasant during visit but states she feels very overwhelmed by what she has been told today. Pt with pelvic mass and pt states she was also told she has diabetes.  Pt states she has not eaten well in months d/t decreased appetite and constipation issues. Pt has also had taste changes so foods do not taste as good as she would like. Per pt's daughter a meal would consist of half an egg.  Pt tried to drink [employer] protein supplements at home but admits to not being consistent in drinking them. Pt really wants to eat well again, she typically is a very good eater with good  appetite. Appetite and current issues began in February/early March per family.   Once procedures and consults are completed with diet advancement, will order protein supplements to help with pt's malnutrition.  Per patient, she weighed 121 lbs on 2/14 during her doctor's appointment. Pt has since lost 9 lbs (7% wt loss x 2 months, significant for time frame). Denies weakness in arms and legs.  Medications: IV KCl, K-Phos  Labs reviewed: CBGs: 167 Low Na, Phos HgbA1c: 7.0  NUTRITION - FOCUSED PHYSICAL EXAM:  Flowsheet Row Most Recent Value  Orbital Region No depletion  Upper Arm Region Moderate depletion  Thoracic and Lumbar Region Unable to assess  Buccal Region Mild depletion  Temple Region Mild depletion  Clavicle Bone Region No depletion  Clavicle and Acromion Bone Region No depletion  Scapular Bone Region No depletion  Dorsal Hand Mild depletion  Patellar Region Unable to assess  Anterior Thigh Region Unable to assess  Posterior Calf Region Unable to assess  Edema (RD Assessment) None       Diet Order:   Diet Order            Diet clear liquid Room service appropriate? Yes; Fluid consistency: Thin  Diet effective now                 EDUCATION NEEDS:   Education needs have been addressed  Skin:  Skin Assessment: Reviewed RN Assessment  Last BM:  4/9  Height:   Ht Readings from Last 1 Encounters:  08/28/20 5' (1.524 m)    Weight:   Wt  Readings from Last 1 Encounters:  08/28/20 51.1 kg    BMI:  Body mass index is 22.01 kg/m.  Estimated Nutritional Needs:   Kcal:  1500-1700  Protein:  65-80g  Fluid:  1.7L/day   Clayton Bibles, MS, RD, LDN Inpatient Clinical Dietitian Contact information available via Amion

## 2020-08-29 NOTE — Consult Note (Signed)
I have been asked to see the patient by Dr. Raiford Noble, for evaluation and management of left Hydro ureteronephrosis.  History of present illness: Unfortunate 75 year old female who was in her usual state of health and recently developed some GI symptoms of mucinous bowel movements abdominal pain and poor appetite.  She subsequently had labs drawn that demonstrated electrolyte abnormality prompting a CT scan.  The CT scan showed a large pelvic mass extending into the retroperitoneum and the lymphadenopathy and left hydroureteronephrosis.  The patient is not complaining of any real significant flank pain.  She is having increased urinary frequency with frequent small voids.  She denies any dysuria.  The patient has no significant past medical history concerning for malignancy.  She does have a family history of uterine cancer.  Review of systems: A 12 point comprehensive review of systems was obtained and is negative unless otherwise stated in the history of present illness.  Patient Active Problem List   Diagnosis Date Noted  . Malnutrition of moderate degree 08/29/2020  . Pelvic mass 08/28/2020  . Hydroureteronephrosis 08/28/2020  . Hypokalemia 08/28/2020  . Hypomagnesemia 08/28/2020  . Hyponatremia 08/28/2020  . Hyperglycemia 08/28/2020  . Constipation 08/28/2020  . AKI (acute kidney injury) ([place]) 08/28/2020    No current facility-administered medications on file prior to encounter.   Current Outpatient Medications on File Prior to Encounter  Medication Sig Dispense Refill  . cholecalciferol (VITAMIN D3) 25 MCG (1000 UNIT) tablet Take 1,000 Units by mouth daily.    . Ferrous Sulfate (IRON PO) Take 1 tablet by mouth daily.    Marland Kitchen losartan-hydrochlorothiazide (HYZAAR) 100-25 MG tablet Take 1 tablet by mouth daily.    . pantoprazole (PROTONIX) 40 MG tablet Take 1 tablet by mouth every morning.    . pravastatin (PRAVACHOL) 80 MG tablet Take 80 mg by mouth every evening.      Past  Medical History:  Diagnosis Date  . Anemia    chronic  . Hyperlipemia   . Hypertension   . Irritable bowel syndrome (IBS)     Past Surgical History:  Procedure Laterality Date  . BREAST EXCISIONAL BIOPSY Left    1985 benign  . HERNIA REPAIR      Social History   Tobacco Use  . Smoking status: Never Smoker  . Smokeless tobacco: Never Used  Substance Use Topics  . Alcohol use: Not Currently  . Drug use: Not Currently    Family History  Problem Relation Age of Onset  . Stomach cancer Mother   . Dementia Mother   . Heart attack Father   . Prostate cancer Brother     PE: Vitals:   08/29/20 0209 08/29/20 0618 08/29/20 0941 08/29/20 1314  BP: (!) 162/91 (!) 152/91 (!) 167/94 (!) 156/92  Pulse: 93 91 83 90  Resp: 20 19 15 15   Temp: 97.8 F (36.6 C) 98 F (36.7 C)  98.4 F (36.9 C)  TempSrc: Oral Oral  Oral  SpO2: 97% 98% 100% 100%  Weight:      Height:       Patient appears to be in no acute distress  patient is alert and oriented x3 Atraumatic normocephalic head No cervical or supraclavicular lymphadenopathy appreciated No increased work of breathing, no audible wheezes/rhonchi Regular sinus rhythm/rate Abdomen is soft, nontender, nondistended, no CVA or suprapubic tenderness Lower extremities are symmetric without appreciable edema Grossly neurologically intact No identifiable skin lesions  Recent Labs    08/28/20 1648 08/29/20 0538  WBC 15.6* 13.6*  HGB 8.7* 8.8*  HCT 26.1* 27.2*   Recent Labs    08/28/20 2138 08/29/20 0538 08/29/20 1307  NA 120* 119* 120*  K 2.6* 4.0 4.2  CL 83* 86* 86*  CO2 21* 23 21*  GLUCOSE 130* 125* 136*  BUN 17 16 15   CREATININE 1.24* 1.28* 1.20*  CALCIUM 8.6* 8.6* 8.8*   No results for input(s): LABPT, INR in the last 72 hours. No results for input(s): LABURIN in the last 72 hours. Results for orders placed or performed during the hospital encounter of 08/28/20  Resp Panel by RT-PCR (Flu A&B, Covid)  Nasopharyngeal Swab     Status: None   Collection Time: 08/28/20  6:39 PM   Specimen: Nasopharyngeal Swab; Nasopharyngeal(NP) swabs in vial transport medium  Result Value Ref Range Status   SARS Coronavirus 2 by RT PCR NEGATIVE NEGATIVE Final    Comment: (NOTE) SARS-CoV-2 target nucleic acids are NOT DETECTED.  The SARS-CoV-2 RNA is generally detectable in upper respiratory specimens during the acute phase of infection. The lowest concentration of SARS-CoV-2 viral copies this assay can detect is 138 copies/mL. A negative result does not preclude SARS-Cov-2 infection and should not be used as the sole basis for treatment or other patient management decisions. A negative result may occur with  improper specimen collection/handling, submission of specimen other than nasopharyngeal swab, presence of viral mutation(s) within the areas targeted by this assay, and inadequate number of viral copies(<138 copies/mL). A negative result must be combined with clinical observations, patient history, and epidemiological information. The expected result is Negative.  Fact Sheet for Patients:  EntrepreneurPulse.com.au  Fact Sheet for Healthcare Providers:  IncredibleEmployment.be  This test is no t yet approved or cleared by the [place] FDA and  has been authorized for detection and/or diagnosis of SARS-CoV-2 by FDA under an Emergency Use Authorization (EUA). This EUA will remain  in effect (meaning this test can be used) for the duration of the COVID-19 declaration under Section 564(b)(1) of the Act, 21 U.S.C.section 360bbb-3(b)(1), unless the authorization is terminated  or revoked sooner.       Influenza A by PCR NEGATIVE NEGATIVE Final   Influenza B by PCR NEGATIVE NEGATIVE Final    Comment: (NOTE) The Xpert Xpress SARS-CoV-2/FLU/RSV plus assay is intended as an aid in the diagnosis of influenza from Nasopharyngeal swab specimens and should not be  used as a sole basis for treatment. Nasal washings and aspirates are unacceptable for Xpert Xpress SARS-CoV-2/FLU/RSV testing.  Fact Sheet for Patients: EntrepreneurPulse.com.au  Fact Sheet for Healthcare Providers: IncredibleEmployment.be  This test is not yet approved or cleared by the [place] FDA and has been authorized for detection and/or diagnosis of SARS-CoV-2 by FDA under an Emergency Use Authorization (EUA). This EUA will remain in effect (meaning this test can be used) for the duration of the COVID-19 declaration under Section 564(b)(1) of the Act, 21 U.S.C. section 360bbb-3(b)(1), unless the authorization is terminated or revoked.  Performed at [facility]     Imaging: I have independently reviewed the patient's CT scan which demonstrates left hydronephrosis extending from the renal pelvis down into the left pelvis where the ureter is lost and appears to be encompassed in the tumor.  Imp: The patient has left hydroureteronephrosis.  Fortunately she is asymptomatic and her renal function is essentially normal.  The hydrois mild at this point.  Recommendations: I had a long conversation with this patient in regards to the management of her hydronephrosis.  In  my experience, this is likely to worsen before gets better.  Further, she is likely to require some sort of systemic therapy and would benefit from maximal renal function.  As such, I do think the patient should either have a stent placed or nephrostomy tube.  I went through the pros and cons of both.  In this circumstance, when the hydrois from extrinsic compression of a large mass she has a 50% failure rate of a ureteral stent.  Further, it will exacerbate any urinary tract symptoms that she currently has.  In addition, it requires general anesthesia to place it and to exchange it.  The advantage of a stent is that it is hidden, and does not require a drainage bag.   We also discussed the nephrostomy tube.  This does not require general anesthesia, and actually can be placed simultaneous with the biopsy of the tumor.  The risk of failure is much lower in a nephrostomy tube.  It will need to be exchanged, and potentially could be internalized depending on how the patient does and how the next 5 or 6 weeks play out.  We discussed all this in significant detail.  Ultimately, the patient, her family, and I have opted to request that a left nephrostomy tube be placed at the time of her percutaneous biopsy of the pelvic mass.  Thank you for involving me in this patient's care, please page with any further questions or concerns. Ardis Hughs

## 2020-08-29 NOTE — Consult Note (Addendum)
[facility] Surgery Consult Note  HOLY BATTENFIELD [birth year]  [id number].    Requesting MD: Alfredia Ferguson, MD Chief Complaint/Reason for Consult: Pelvic Mass, constipation  HPI:  Ms. Teresa Oliver is a 75 y/o F with PMH HTN, HLD, IBS who presented to the hospital after outpatient lab work showed significant electrolyte abnormalities. She reports 4-6 week history of progressive decrease in appetite, weight loss, constipation, and urinary sxs. Other associated sxs  Reported by the patient include abdominal distention, acid reflux, a single small episode of vaginal bleeding, and nausea without vomiting. Her last BM was Sunday and was described as loose, non-bloody. Reports a change in BMs from solid stool to smaller caliber and loose. Also reports passing some rectal mucous. She denies fever, chills, CP, SOB, abdominal pain, dysuria, gross hematuria, melena, or hematochezia. States her last colonoscopy was over 10 years ago and cologuard in 2018 was normal. She denies tobacco, alcohol, or drug use. Denies use of blood thinning medications. NKDA. Reports no past abdominal surgeries.  Has received 2 doses of pfizer vaccine. During my exam today her husband and son are at the bedside - she also has a daughter.  Workup significant for hyponatremia (117), hypokalemia (2.6), hypochloremia (80), hypomagnesemia (1.5), microcytic anemia (hgb 8.7/hct 26), negative fecal occult blood test, negative UA, and CT of the abdomen and pelvis significant for below:   IMPRESSION: 1. Pelvic mass measuring 7.5 x 4.8 cm, contiguous with the sigmoid colon and uterus with loss of fat planes. Site of origin is unclear, may be ovarian, uterine, or colonic. Lack of contrast limits detailed assessment. 2. Above pelvic mass causes partial obstruction of the left ureter with mild left hydroureteronephrosis. 3. Multifocal adenopathy in the abdomen and pelvis, suspicious for metastatic disease, primarily in the mesentery and lower  peritoneum and pelvis. Please note that detailed assessment of adenopathy is limited on this noncontrast exam. Recommend oncology referral. 4. Wall thickening of the sigmoid colon distal to the left pelvic mass with pericolonic edema, suspicious for colitis. There is a moderate to large amount of stool in the more proximal colon, suggesting pelvic mass may be causing delayed colonic transit. 5. Small to moderate hiatal hernia.  ROS: As above Review of Systems  All other systems reviewed and are negative.   Family History  Problem Relation Age of Onset  . Stomach cancer Mother   . Dementia Mother   . Heart attack Father   . Prostate cancer Brother     Past Medical History:  Diagnosis Date  . Anemia    chronic  . Hyperlipemia   . Hypertension   . Irritable bowel syndrome (IBS)     Past Surgical History:  Procedure Laterality Date  . BREAST EXCISIONAL BIOPSY Left    1985 benign  . HERNIA REPAIR      Social History:  reports that she has never smoked. She has never used smokeless tobacco. She reports previous alcohol use. She reports previous drug use.  Allergies: No Known Allergies  Medications Prior to Admission  Medication Sig Dispense Refill  . cholecalciferol (VITAMIN D3) 25 MCG (1000 UNIT) tablet Take 1,000 Units by mouth daily.    . Ferrous Sulfate (IRON PO) Take 1 tablet by mouth daily.    Marland Kitchen losartan-hydrochlorothiazide (HYZAAR) 100-25 MG tablet Take 1 tablet by mouth daily.    . pantoprazole (PROTONIX) 40 MG tablet Take 1 tablet by mouth every morning.    . pravastatin (PRAVACHOL) 80 MG tablet Take 80 mg by mouth every evening.  Blood pressure (!) 156/92, pulse 90, temperature 98.4 F (36.9 C), temperature source Oral, resp. rate 15, height 5' (1.524 m), weight 51.1 kg, SpO2 100 %. Physical Exam: Constitutional: NAD; conversant; no deformities Eyes: Moist conjunctiva; no lid lag; anicteric; PERRL Neck: Trachea midline; no thyromegaly Lungs: Normal  respiratory effort; no tactile fremitus CV: RRR; no palpable thrills; no pitting edema GI: Abd soft, non-tender, mildly distended, +BS no palpable hepatosplenomegaly MSK: Normal gait; no clubbing/cyanosis Psychiatric: Appropriate affect; alert and oriented x3 Lymphatic: No palpable cervical or axillary lymphadenopathy  Results for orders placed or performed during the hospital encounter of 08/28/20 (from the past 48 hour(s))  CBC with Differential     Status: Abnormal   Collection Time: 08/28/20  4:48 PM  Result Value Ref Range   WBC 15.6 (H) 4.0 - 10.5 K/uL   RBC 4.10 3.87 - 5.11 MIL/uL   Hemoglobin 8.7 (L) 12.0 - 15.0 g/dL    Comment: Reticulocyte Hemoglobin testing may be clinically indicated, consider ordering this additional test [id number]    HCT 26.1 (L) 36.0 - 46.0 %   MCV 63.7 (L) 80.0 - 100.0 fL   MCH 21.2 (L) 26.0 - 34.0 pg   MCHC 33.3 30.0 - 36.0 g/dL   RDW 13.2 11.5 - 15.5 %   Platelets 435 (H) 150 - 400 K/uL   nRBC 0.0 0.0 - 0.2 %   Neutrophils Relative % 82 %   Neutro Abs 12.7 (H) 1.7 - 7.7 K/uL   Lymphocytes Relative 6 %   Lymphs Abs 1.0 0.7 - 4.0 K/uL   Monocytes Relative 8 %   Monocytes Absolute 1.3 (H) 0.1 - 1.0 K/uL   Eosinophils Relative 1 %   Eosinophils Absolute 0.2 0.0 - 0.5 K/uL   Basophils Relative 0 %   Basophils Absolute 0.1 0.0 - 0.1 K/uL   Immature Granulocytes 3 %   Abs Immature Granulocytes 0.46 (H) 0.00 - 0.07 K/uL   Acanthocytes PRESENT     Comment: Performed at [facility]  Comprehensive metabolic panel     Status: Abnormal   Collection Time: 08/28/20  4:48 PM  Result Value Ref Range   Sodium 117 (LL) 135 - 145 mmol/L    Comment: CRITICAL RESULT CALLED TO, READ BACK BY AND VERIFIED WITH: Dalia Heading RN [address] 08/28/20    Potassium 2.6 (LL) 3.5 - 5.1 mmol/L    Comment: CRITICAL RESULT CALLED TO, READ BACK BY AND VERIFIED WITH: Weyman Croon RENNICKS RN [address] 08/28/20    Chloride 80 (L) 98 - 111 mmol/L   CO2 21 (L) 22 -  32 mmol/L   Glucose, Bld 135 (H) 70 - 99 mg/dL    Comment: Glucose reference range applies only to samples taken after fasting for at least 8 hours.   BUN 18 8 - 23 mg/dL   Creatinine, Ser 1.22 (H) 0.44 - 1.00 mg/dL   Calcium 8.8 (L) 8.9 - 10.3 mg/dL   Total Protein 6.9 6.5 - 8.1 g/dL   Albumin 3.6 3.5 - 5.0 g/dL   AST 20 15 - 41 U/L   ALT 17 0 - 44 U/L   Alkaline Phosphatase 85 38 - 126 U/L   Total Bilirubin 0.5 0.3 - 1.2 mg/dL   GFR, Estimated 47 (L) >60 mL/min    Comment: (NOTE) Calculated using the CKD-EPI Creatinine Equation (2021)    Anion gap 16 (H) 5 - 15    Comment: Performed at [facility]  Lipase, blood  Status: None   Collection Time: 08/28/20  4:48 PM  Result Value Ref Range   Lipase 24 11 - 51 U/L    Comment: Performed at [facility]  Urinalysis, Routine w reflex microscopic Urine, Clean Catch     Status: Abnormal   Collection Time: 08/28/20  4:48 PM  Result Value Ref Range   Color, Urine YELLOW YELLOW   APPearance CLEAR CLEAR   Specific Gravity, Urine 1.012 1.005 - 1.030   pH 5.5 5.0 - 8.0   Glucose, UA NEGATIVE NEGATIVE mg/dL   Hgb urine dipstick NEGATIVE NEGATIVE   Bilirubin Urine NEGATIVE NEGATIVE   Ketones, ur NEGATIVE NEGATIVE mg/dL   Protein, ur TRACE (A) NEGATIVE mg/dL   Nitrite NEGATIVE NEGATIVE   Leukocytes,Ua NEGATIVE NEGATIVE   RBC / HPF 0-5 0 - 5 RBC/hpf   WBC, UA 0-5 0 - 5 WBC/hpf   Squamous Epithelial / LPF 0-5 0 - 5   Mucus PRESENT     Comment: Performed at [facility]  Magnesium     Status: Abnormal   Collection Time: 08/28/20  5:31 PM  Result Value Ref Range   Magnesium 1.5 (L) 1.7 - 2.4 mg/dL    Comment: Performed at [facility]  Resp Panel by RT-PCR (Flu A&B, Covid) Nasopharyngeal Swab     Status: None   Collection Time: 08/28/20  6:39 PM   Specimen: Nasopharyngeal Swab; Nasopharyngeal(NP) swabs in vial transport medium  Result Value Ref Range   SARS  Coronavirus 2 by RT PCR NEGATIVE NEGATIVE    Comment: (NOTE) SARS-CoV-2 target nucleic acids are NOT DETECTED.  The SARS-CoV-2 RNA is generally detectable in upper respiratory specimens during the acute phase of infection. The lowest concentration of SARS-CoV-2 viral copies this assay can detect is 138 copies/mL. A negative result does not preclude SARS-Cov-2 infection and should not be used as the sole basis for treatment or other patient management decisions. A negative result may occur with  improper specimen collection/handling, submission of specimen other than nasopharyngeal swab, presence of viral mutation(s) within the areas targeted by this assay, and inadequate number of viral copies(<138 copies/mL). A negative result must be combined with clinical observations, patient history, and epidemiological information. The expected result is Negative.  Fact Sheet for Patients:  EntrepreneurPulse.com.au  Fact Sheet for Healthcare Providers:  IncredibleEmployment.be  This test is no t yet approved or cleared by the [place] FDA and  has been authorized for detection and/or diagnosis of SARS-CoV-2 by FDA under an Emergency Use Authorization (EUA). This EUA will remain  in effect (meaning this test can be used) for the duration of the COVID-19 declaration under Section 564(b)(1) of the Act, 21 U.S.C.section 360bbb-3(b)(1), unless the authorization is terminated  or revoked sooner.       Influenza A by PCR NEGATIVE NEGATIVE   Influenza B by PCR NEGATIVE NEGATIVE    Comment: (NOTE) The Xpert Xpress SARS-CoV-2/FLU/RSV plus assay is intended as an aid in the diagnosis of influenza from Nasopharyngeal swab specimens and should not be used as a sole basis for treatment. Nasal washings and aspirates are unacceptable for Xpert Xpress SARS-CoV-2/FLU/RSV testing.  Fact Sheet for Patients: EntrepreneurPulse.com.au  Fact Sheet  for Healthcare Providers: IncredibleEmployment.be  This test is not yet approved or cleared by the [place] FDA and has been authorized for detection and/or diagnosis of SARS-CoV-2 by FDA under an Emergency Use Authorization (EUA). This EUA will remain in effect (meaning this test can be  used) for the duration of the COVID-19 declaration under Section 564(b)(1) of the Act, 21 U.S.C. section 360bbb-3(b)(1), unless the authorization is terminated or revoked.  Performed at [facility]   Occult blood card to lab, stool Provider will collect     Status: None   Collection Time: 08/28/20  6:41 PM  Result Value Ref Range   Fecal Occult Bld NEGATIVE NEGATIVE    Comment: Performed at [facility]  Basic metabolic panel     Status: Abnormal   Collection Time: 08/28/20  9:38 PM  Result Value Ref Range   Sodium 120 (L) 135 - 145 mmol/L   Potassium 2.6 (LL) 3.5 - 5.1 mmol/L    Comment: CRITICAL RESULT CALLED TO, READ BACK BY AND VERIFIED WITH: GRAVES S. 04.11.22 @ 2259 BY MECIAL J.    Chloride 83 (L) 98 - 111 mmol/L   CO2 21 (L) 22 - 32 mmol/L   Glucose, Bld 130 (H) 70 - 99 mg/dL    Comment: Glucose reference range applies only to samples taken after fasting for at least 8 hours.   BUN 17 8 - 23 mg/dL   Creatinine, Ser 1.24 (H) 0.44 - 1.00 mg/dL   Calcium 8.6 (L) 8.9 - 10.3 mg/dL   GFR, Estimated 46 (L) >60 mL/min    Comment: (NOTE) Calculated using the CKD-EPI Creatinine Equation (2021)    Anion gap 16 (H) 5 - 15    Comment: Performed at [facility], [place] [address] [place], [place] [address]  Glucose, capillary     Status: Abnormal   Collection Time: 08/29/20  1:03 AM  Result Value Ref Range   Glucose-Capillary 167 (H) 70 - 99 mg/dL    Comment: Glucose reference range applies only to samples taken after fasting for at least 8 hours.  Osmolality     Status: Abnormal   Collection Time: 08/29/20  5:38 AM   Result Value Ref Range   Osmolality 247 (LL) 275 - 295 mOsm/kg    Comment: REPEATED TO VERIFY CRITICAL RESULT CALLED TO, READ BACK BY AND VERIFIED WITH: Y.SMITH,RN 08/29/20 1029 DAVISB Performed at [facility], [place] [address] [place], [place] [address]   Basic metabolic panel     Status: Abnormal   Collection Time: 08/29/20  5:38 AM  Result Value Ref Range   Sodium 119 (LL) 135 - 145 mmol/L    Comment: CRITICAL RESULT CALLED TO, READ BACK BY AND VERIFIED WITH: C. GRAVES 04.12.22 @ 0619  BY MECIAL J.    Potassium 4.0 3.5 - 5.1 mmol/L    Comment: DELTA CHECK NOTED   Chloride 86 (L) 98 - 111 mmol/L   CO2 23 22 - 32 mmol/L   Glucose, Bld 125 (H) 70 - 99 mg/dL    Comment: Glucose reference range applies only to samples taken after fasting for at least 8 hours.   BUN 16 8 - 23 mg/dL   Creatinine, Ser 1.28 (H) 0.44 - 1.00 mg/dL   Calcium 8.6 (L) 8.9 - 10.3 mg/dL   GFR, Estimated 44 (L) >60 mL/min    Comment: (NOTE) Calculated using the CKD-EPI Creatinine Equation (2021)    Anion gap 10 5 - 15    Comment: Performed at [facility], [place] [address], [place], [place] [address]  CBC with Differential/Platelet     Status: Abnormal   Collection Time: 08/29/20  5:38 AM  Result Value Ref Range   WBC 13.6 (H) 4.0 -  10.5 K/uL   RBC 4.15 3.87 - 5.11 MIL/uL   Hemoglobin 8.8 (L) 12.0 - 15.0 g/dL    Comment: Reticulocyte Hemoglobin testing may be clinically indicated, consider ordering this additional test [id number]    HCT 27.2 (L) 36.0 - 46.0 %   MCV 65.5 (L) 80.0 - 100.0 fL   MCH 21.2 (L) 26.0 - 34.0 pg   MCHC 32.4 30.0 - 36.0 g/dL   RDW 13.2 11.5 - 15.5 %   Platelets 425 (H) 150 - 400 K/uL   nRBC 0.0 0.0 - 0.2 %   Neutrophils Relative % 83 %   Neutro Abs 11.4 (H) 1.7 - 7.7 K/uL   Lymphocytes Relative 6 %   Lymphs Abs 0.8 0.7 - 4.0 K/uL   Monocytes Relative 7 %   Monocytes Absolute 1.0 0.1 - 1.0 K/uL   Eosinophils Relative 1 %   Eosinophils Absolute 0.1  0.0 - 0.5 K/uL   Basophils Relative 0 %   Basophils Absolute 0.0 0.0 - 0.1 K/uL   Immature Granulocytes 3 %   Abs Immature Granulocytes 0.34 (H) 0.00 - 0.07 K/uL    Comment: Performed at [facility], [place] [address] [place], [place] [address]  Magnesium     Status: None   Collection Time: 08/29/20  5:38 AM  Result Value Ref Range   Magnesium 2.2 1.7 - 2.4 mg/dL    Comment: Performed at [facility], [place] [address], [place], [place] [address]  Phosphorus     Status: Abnormal   Collection Time: 08/29/20  5:38 AM  Result Value Ref Range   Phosphorus 1.8 (L) 2.5 - 4.6 mg/dL    Comment: Performed at [facility], [place] [address] [place], [place] [address]  Hemoglobin A1c     Status: Abnormal   Collection Time: 08/29/20  5:38 AM  Result Value Ref Range   Hgb A1c MFr Bld 7.0 (H) 4.8 - 5.6 %    Comment: (NOTE) Pre diabetes:          5.7%-6.4%  Diabetes:              >6.4%  Glycemic control for   <7.0% adults with diabetes    Mean Plasma Glucose 154.2 mg/dL    Comment: Performed at [place] [address], [place], [place] [address]  Glucose, capillary     Status: Abnormal   Collection Time: 08/29/20 11:56 AM  Result Value Ref Range   Glucose-Capillary 135 (H) 70 - 99 mg/dL    Comment: Glucose reference range applies only to samples taken after fasting for at least 8 hours.  Comprehensive metabolic panel     Status: Abnormal   Collection Time: 08/29/20  1:07 PM  Result Value Ref Range   Sodium 120 (L) 135 - 145 mmol/L   Potassium 4.2 3.5 - 5.1 mmol/L   Chloride 86 (L) 98 - 111 mmol/L   CO2 21 (L) 22 - 32 mmol/L   Glucose, Bld 136 (H) 70 - 99 mg/dL    Comment: Glucose reference range applies only to samples taken after fasting for at least 8 hours.   BUN 15 8 - 23 mg/dL   Creatinine, Ser 1.20 (H) 0.44 - 1.00 mg/dL   Calcium 8.8 (L) 8.9 - 10.3 mg/dL   Total Protein 7.0 6.5 - 8.1 g/dL   Albumin 3.0 (L) 3.5 - 5.0  g/dL   AST 21 15 - 41 U/L   ALT 19 0 -  44 U/L   Alkaline Phosphatase 94 38 - 126 U/L   Total Bilirubin 0.5 0.3 - 1.2 mg/dL   GFR, Estimated 48 (L) >60 mL/min    Comment: (NOTE) Calculated using the CKD-EPI Creatinine Equation (2021)    Anion gap 13 5 - 15    Comment: Performed at [facility], [place] [address], [place], [place] [address]   CT ABDOMEN PELVIS WO CONTRAST  Result Date: 08/28/2020 CLINICAL DATA:  Abdominal pain, acute, nonlocalized EXAM: CT ABDOMEN AND PELVIS WITHOUT CONTRAST TECHNIQUE: Multidetector CT imaging of the abdomen and pelvis was performed following the standard protocol without IV contrast. COMPARISON:  Radiograph 08/25/2020 FINDINGS: Lower chest: Lung bases are clear. No focal consolidation or pleural fluid. Small hiatal hernia. Hepatobiliary: No focal liver abnormality is seen. No gallstones, gallbladder wall thickening, or biliary dilatation. Pancreas: No ductal dilatation or inflammation. Spleen: Normal in size without focal abnormality. Adrenals/Urinary Tract: No adrenal nodule. Minimal left hydronephrosis. Left ureter is dilated to the pelvis. No renal or ureteral stone. No significant perinephric edema. No right hydronephrosis. No right renal stone. Decompressed right ureter. Urinary bladder is essentially empty and not well assessed. Stomach/Bowel: Bowel evaluation is limited in the absence of enteric contrast. There is wall thickening of the sigmoid colon with mild pericolonic edema. The mid sigmoid colon appears apposed to the uterus with obliteration of normal fat plane. Sigmoid colon is also difficult to delineate from a pelvic mass, which is also contiguous with the uterus. Please note that detailed assessment is limited on this noncontrast exam. There is a moderate to large volume of stool in the more proximal colon. No definite small bowel obstruction or inflammatory change. Appendix is normal. Small to moderate hiatal hernia. Vascular/Lymphatic:  Multifocal adenopathy. Please note that detailed assessment of adenopathy is limited on this noncontrast exam. For instance, left periaortic node measures 16 mm short axis, series 2, image 30. Additional prominent and mildly enlarged lower retroperitoneal lymph nodes are seen. Right upper quadrant adenopathy with lymph nodes measuring 11 in 15 mm, series 2, image 36, likely in the mesentery. Right lower quadrant mesenteric node measures 13 mm, series 2, image 44. There additional enlarged ileocolic lymph nodes. 9 mm right external iliac node. Rounded soft tissue mass about the left pelvic sidewall measures 2.3 x 3.3 cm, may be adnexal mass or iliac adenopathy. There is a 17 mm left external iliac node, series 2, image 58. Prominent bilateral inguinal nodes. Reproductive: Pelvic mass just to the left of midline measures 7.5 x 4.8 cm, and is contiguous with the sigmoid colon and uterus. The ovary is not definitively visualized. Right ovary is not definitively seen. Other: Mild fat stranding in the pelvis as well as presacral region. Minimal omental edema without obvious omental thickening or mass. No ascites. No free air. Musculoskeletal: No acute osseous abnormality or focal bone lesion. No subcutaneous abnormality. IMPRESSION: 1. Pelvic mass measuring 7.5 x 4.8 cm, contiguous with the sigmoid colon and uterus with loss of fat planes. Site of origin is unclear, may be ovarian, uterine, or colonic. Lack of contrast limits detailed assessment. 2. Above pelvic mass causes partial obstruction of the left ureter with mild left hydroureteronephrosis. 3. Multifocal adenopathy in the abdomen and pelvis, suspicious for metastatic disease, primarily in the mesentery and lower peritoneum and pelvis. Please note that detailed assessment of adenopathy is limited on this noncontrast exam. Recommend oncology referral. 4. Wall thickening of the sigmoid colon distal to the left pelvic mass with pericolonic edema,  suspicious for  colitis. There is a moderate to large amount of stool in the more proximal colon, suggesting pelvic mass may be causing delayed colonic transit. 5. Small to moderate hiatal hernia. Electronically Signed   By: Keith Rake M.D.   On: 08/28/2020 18:30   Assessment/Plan HTN IBS DM2 - A1c 7.0 AKI vs CKD Hyponatremia Hypokalemia Hypomagnesemia  Microcytic anemia  Thrombocytosis  Constipation - Agree with BID miralax, senna, and PRN suppository for constipation. May need an enema.  Pelvic mass with concern for metastatic malignancy of unknown primary etiology - 7.5 x 4.8 cm, contiguous with the sigmoid colon and uterus with loss of fat planes - GYN-Onc consulted and performed pelvic exam (normal)/PAP smear 4/12 and ordered tumor markers, chest CT. CA125 is 966, CEA and CA19-9 WNL. conerning for GYN malignancy but no concussive evidence that this is GYN malignancy so far.  - IR consulted and performed Bx left pelvic side wall today - Urology consulted and recommending L nephrostomy tube  - currently the patient is constipated with signs of partial large bowel obstruction. Discussed with medical team and GI - plan for colonoscopy this admission. Follow up results of IR bx and any potential Bx obtained during colonoscopy. No urgent general surgery needs, we will follow.    Jill Alexanders, PA-C [facility] Please see Amion for pager number during day hours 7:00am-4:30pm 08/29/2020, 2:39 PM

## 2020-08-29 NOTE — Progress Notes (Signed)
   08/29/20 1100  Provider Notification  Provider Name/Title Dr. Alfredia Ferguson  Date Provider Notified 08/29/20  Time Provider Notified 1048  Notification Type Page  Notification Reason Critical result  Test performed and critical result Osmolality 247  Date Critical Result Received 08/29/20  Time Critical Result Received 1029  Provider response See new orders  Date of Provider Response 08/29/20  Time of Provider Response 8250

## 2020-08-29 NOTE — Progress Notes (Signed)
Date and time results received: 08/29/20 0620  Test: Na+ Critical Value: 119  Name of Provider Notified: Dwyane Dee  Awaiting response

## 2020-08-29 NOTE — Consult Note (Addendum)
Gynecologic Oncology Consultation  HPI: Teresa Oliver is a 75 year old female who presented to the ED on 08/28/2020 for abnormal lab values taken at [place]. She began having abdominal pain that has lasted for several weeks. She went to her PCP after developing mucous-like rectal discharge and was placed on Linzess, which did little to resolve her symptoms. Through this time, the patient reported weight loss (121 on Feb 14 and now 113 lbs), decreased appetite, constipation, and nausea.   She was seen by [facility] where she had abnormal lab findings prompting ED evaluation. Repeat lab work in the ED showed a WBC count of 15.6, Hgb 8.7, K+2.6, Na+ 117, creatinine 1.22, magnesium 1.5. At CT scan of the abdomen and pelvis without contrast was performed on 08/28/2020 revealing a pelvic mass measuring 7.5 cm (contiguous with the sigmoid colon and uterus with loss of fat planes), partial left ureteral obstruction, multifocal adenopathy in the abdomen and pelvis suspicious for metastatic disease, and wall thickening of the sigmoid colon. Her medical history includes anemia, hyperlipidemia, hypertension, and IBS.  Interval History: She states she was feeling great and had a "clean bill of health" at her physical on Jul 03, 2020. Two weeks after that time, she began feeling poorly. Reporting a decreased appetite since food made her "feel sick" and experienced changes in the taste of foods. Reports insomnia and intermittent anxiety since that time. She does report seeing "a few drops of blood" on a pantyliner which she feels came from her vagina. No abnormal discharge reported. Continuing to have mucus-like bowel movements. She states she had stopped going to a gyn regularly years back and denies ever having an issue with any of her gyn organs. She states she feels her pap smears had been normal for most of her life and she went through menopause at age 34.   Review of Systems  Constitutional: Feels weak. Positive for  weight loss, decreased appetite. No fever, chills. Cardiovascular: No chest pain, shortness of breath, or edema.  Pulmonary: No cough or wheeze.  Gastrointestinal:  Positive for constipation, nausea. Genitourinary: Decreased urine stream and must sit for a period of time before urine stream begins. No vaginal discharge but reports seeing a few drops of blood on a pantyliner.  Musculoskeletal: No new myalgia or joint pain. Neurologic: Positive for weakness. No new numbness or change in gait.  Psychology: Positive for insomnia and intermittent anxiety. No depression.  Health Maintenance: Mammogram: Last 04/2020 Colonoscopy: At least 10 yrs ago, performed a Cologuard in 2018  Current Meds: Current inpatient medications reviewed.  Allergy: No Known Allergies  Social Hx:   Social History   Socioeconomic History  . Marital status: Married    Spouse name: Not on file  . Number of children: Not on file  . Years of education: Not on file  . Highest education level: Not on file  Occupational History  . Not on file  Tobacco Use  . Smoking status: Never Smoker  . Smokeless tobacco: Never Used  Substance and Sexual Activity  . Alcohol use: Not Currently  . Drug use: Not Currently  . Sexual activity: Not on file  Other Topics Concern  . Not on file  Social History Narrative  . Not on file   Social Determinants of Health   Financial Resource Strain: Not on file  Food Insecurity: Not on file  Transportation Needs: Not on file  Physical Activity: Not on file  Stress: Not on file  Social Connections:  Not on file  Intimate Partner Violence: Not on file    Past Surgical Hx:  Past Surgical History:  Procedure Laterality Date  . BREAST EXCISIONAL BIOPSY Left    1985 benign  . HERNIA REPAIR      Past Medical Hx:  Past Medical History:  Diagnosis Date  . Anemia    chronic  . Hyperlipemia   . Hypertension   . Irritable bowel syndrome (IBS)     Family Hx:  Family History   Problem Relation Age of Onset  . Stomach cancer Mother   . Dementia Mother   . Heart attack Father   . Prostate cancer Brother    CBC    Component Value Date/Time   WBC 13.6 (H) 08/29/2020 0538   RBC 4.15 08/29/2020 0538   HGB 8.8 (L) 08/29/2020 0538   HCT 27.2 (L) 08/29/2020 0538   PLT 425 (H) 08/29/2020 0538   MCV 65.5 (L) 08/29/2020 0538   MCH 21.2 (L) 08/29/2020 0538   MCHC 32.4 08/29/2020 0538   RDW 13.2 08/29/2020 0538   LYMPHSABS 0.8 08/29/2020 0538   MONOABS 1.0 08/29/2020 0538   EOSABS 0.1 08/29/2020 0538   BASOSABS 0.0 08/29/2020 0538   BMET    Component Value Date/Time   NA 120 (L) 08/29/2020 1307   K 4.2 08/29/2020 1307   CL 86 (L) 08/29/2020 1307   CO2 21 (L) 08/29/2020 1307   GLUCOSE 136 (H) 08/29/2020 1307   BUN 15 08/29/2020 1307   CREATININE 1.20 (H) 08/29/2020 1307   CALCIUM 8.8 (L) 08/29/2020 1307   GFRNONAA 48 (L) 08/29/2020 1307    Vitals:  Blood pressure (!) 156/92, pulse 90, temperature 98.4 F (36.9 C), temperature source Oral, resp. rate 15, height 5' (1.524 m), weight 112 lb 11.2 oz (51.1 kg), SpO2 100 %.  Physical Exam: Performed by Dr Berline Lopes  Assessment/Plan: 75 year old female currently admitted with 7.5 cm pelvic mass and multifocal adenopathy in the abdomen and pelvis found CT imaging. Patient reviewed with Dr. Berline Lopes who will see the patient later today. Dr. Berline Lopes is recommending chest imaging, tumor markers including CEA/CA 125/CA 19-9, IR biopsy (possible bx of mass on left pelvic sidewall measuring 3.3 cm), and possible general surgery consultation. Based on clinical presentation and CT findings, there is a concern for GI vs GYN cancer. Given symptomatology and distribution of disease, would favor GI. Recommend biopsy to distinguish organ of origin.  Dorothyann Gibbs, NP 08/29/2020, 3:09 PM

## 2020-08-30 ENCOUNTER — Inpatient Hospital Stay (HOSPITAL_COMMUNITY): Payer: Medicare Other

## 2020-08-30 ENCOUNTER — Encounter (HOSPITAL_COMMUNITY): Payer: Self-pay | Admitting: Internal Medicine

## 2020-08-30 DIAGNOSIS — R1900 Intra-abdominal and pelvic swelling, mass and lump, unspecified site: Secondary | ICD-10-CM | POA: Diagnosis not present

## 2020-08-30 LAB — CBC WITH DIFFERENTIAL/PLATELET
Abs Immature Granulocytes: 0.38 10*3/uL — ABNORMAL HIGH (ref 0.00–0.07)
Basophils Absolute: 0.1 10*3/uL (ref 0.0–0.1)
Basophils Relative: 0 %
Eosinophils Absolute: 0.2 10*3/uL (ref 0.0–0.5)
Eosinophils Relative: 1 %
HCT: 28.9 % — ABNORMAL LOW (ref 36.0–46.0)
Hemoglobin: 9.4 g/dL — ABNORMAL LOW (ref 12.0–15.0)
Immature Granulocytes: 3 %
Lymphocytes Relative: 7 %
Lymphs Abs: 0.9 10*3/uL (ref 0.7–4.0)
MCH: 21.4 pg — ABNORMAL LOW (ref 26.0–34.0)
MCHC: 32.5 g/dL (ref 30.0–36.0)
MCV: 65.8 fL — ABNORMAL LOW (ref 80.0–100.0)
Monocytes Absolute: 0.8 10*3/uL (ref 0.1–1.0)
Monocytes Relative: 6 %
Neutro Abs: 10.7 10*3/uL — ABNORMAL HIGH (ref 1.7–7.7)
Neutrophils Relative %: 83 %
Platelets: 445 10*3/uL — ABNORMAL HIGH (ref 150–400)
RBC: 4.39 MIL/uL (ref 3.87–5.11)
RDW: 13.5 % (ref 11.5–15.5)
WBC: 13 10*3/uL — ABNORMAL HIGH (ref 4.0–10.5)
nRBC: 0 % (ref 0.0–0.2)

## 2020-08-30 LAB — CEA: CEA: <0.3 ng/mL (ref 0.0–4.7)

## 2020-08-30 LAB — FERRITIN: Ferritin: 771 ng/mL — ABNORMAL HIGH (ref 11–307)

## 2020-08-30 LAB — RETICULOCYTES
Immature Retic Fract: 20.1 % — ABNORMAL HIGH (ref 2.3–15.9)
RBC.: 4.36 MIL/uL (ref 3.87–5.11)
Retic Count, Absolute: 57.6 10*3/uL (ref 19.0–186.0)
Retic Ct Pct: 1.3 % (ref 0.4–3.1)

## 2020-08-30 LAB — COMPREHENSIVE METABOLIC PANEL
ALT: 18 U/L (ref 0–44)
AST: 24 U/L (ref 15–41)
Albumin: 2.7 g/dL — ABNORMAL LOW (ref 3.5–5.0)
Alkaline Phosphatase: 87 U/L (ref 38–126)
Anion gap: 11 (ref 5–15)
BUN: 14 mg/dL (ref 8–23)
CO2: 21 mmol/L — ABNORMAL LOW (ref 22–32)
Calcium: 8.5 mg/dL — ABNORMAL LOW (ref 8.9–10.3)
Chloride: 86 mmol/L — ABNORMAL LOW (ref 98–111)
Creatinine, Ser: 1.15 mg/dL — ABNORMAL HIGH (ref 0.44–1.00)
GFR, Estimated: 50 mL/min — ABNORMAL LOW (ref >60)
Glucose, Bld: 122 mg/dL — ABNORMAL HIGH (ref 70–99)
Potassium: 4 mmol/L (ref 3.5–5.1)
Sodium: 118 mmol/L — CL (ref 135–145)
Total Bilirubin: 0.8 mg/dL (ref 0.3–1.2)
Total Protein: 6.7 g/dL (ref 6.5–8.1)

## 2020-08-30 LAB — PROTIME-INR
INR: 1 (ref 0.8–1.2)
Prothrombin Time: 12.7 seconds (ref 11.4–15.2)

## 2020-08-30 LAB — GLUCOSE, CAPILLARY
Glucose-Capillary: 106 mg/dL — ABNORMAL HIGH (ref 70–99)
Glucose-Capillary: 114 mg/dL — ABNORMAL HIGH (ref 70–99)
Glucose-Capillary: 133 mg/dL — ABNORMAL HIGH (ref 70–99)
Glucose-Capillary: 138 mg/dL — ABNORMAL HIGH (ref 70–99)

## 2020-08-30 LAB — PREALBUMIN: Prealbumin: 7.4 mg/dL — ABNORMAL LOW (ref 18–38)

## 2020-08-30 LAB — IRON AND TIBC
Iron: 31 ug/dL (ref 28–170)
Saturation Ratios: 14 % (ref 10.4–31.8)
TIBC: 218 ug/dL — ABNORMAL LOW (ref 250–450)
UIBC: 187 ug/dL

## 2020-08-30 LAB — CANCER ANTIGEN 19-9: CA 19-9: 7 U/mL (ref 0–35)

## 2020-08-30 LAB — MAGNESIUM: Magnesium: 1.7 mg/dL (ref 1.7–2.4)

## 2020-08-30 LAB — CA 125: Cancer Antigen (CA) 125: 966 U/mL — ABNORMAL HIGH (ref 0.0–38.1)

## 2020-08-30 LAB — CREATININE, URINE, RANDOM: Creatinine, Urine: 162.77 mg/dL

## 2020-08-30 LAB — VITAMIN B12: Vitamin B-12: 978 pg/mL — ABNORMAL HIGH (ref 180–914)

## 2020-08-30 LAB — FOLATE: Folate: 7.2 ng/mL (ref >5.9)

## 2020-08-30 LAB — PHOSPHORUS: Phosphorus: 3.1 mg/dL (ref 2.5–4.6)

## 2020-08-30 LAB — SODIUM, URINE, RANDOM: Sodium, Ur: 20 mmol/L

## 2020-08-30 IMAGING — CT CT BIOPSY
2 of 7 series · 13 of 32 positions shown, 18 images · non-contrast
Comparison: none

INDICATION: 75-year-old woman with uterine mass and pelvic lymphadenopathy
presents to interventional radiology for biopsy of left external
iliac chain lymph node.

[Series 2: i-spiral 5.0 bf37 · axial · 0.98mm/px · z∈[-362,-275]mm · 5 of 39 slices shown (1 of 2)]
[im 7/39  soft-tissue]
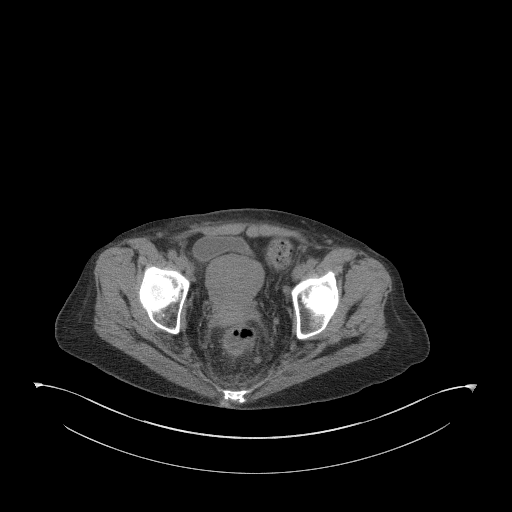
[im 13/39  soft-tissue]
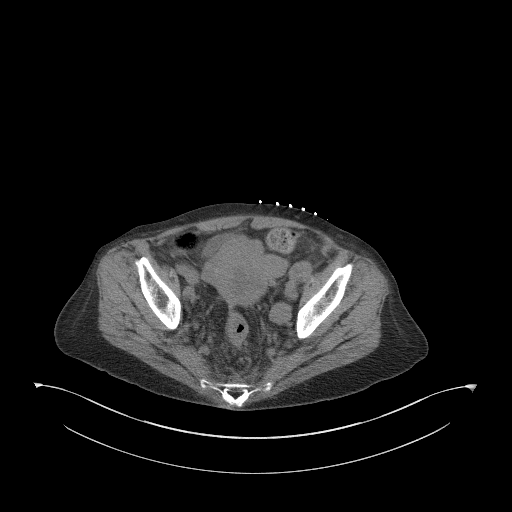
[im 20/39  soft-tissue]
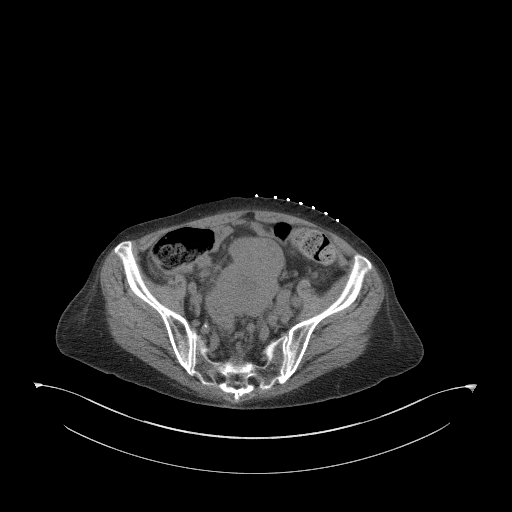
[im 26/39  soft-tissue]
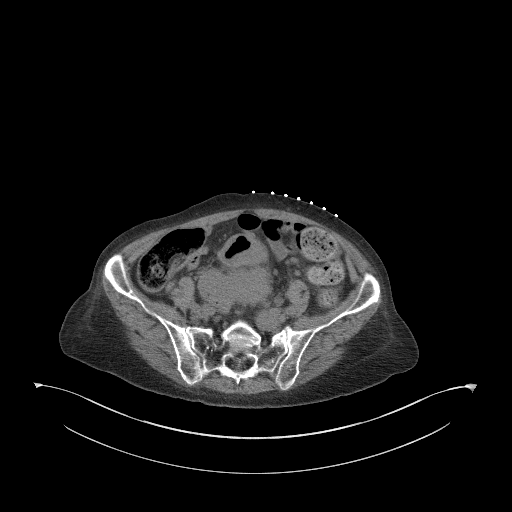
[im 32/39  soft-tissue]
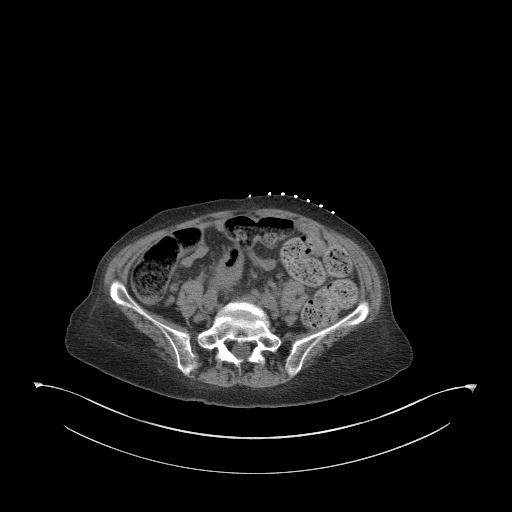

[Series 3: i-spiral 5.0 bf37 · axial · 0.98mm/px · z∈[-415,-292]mm · 8 of 47 slices shown, 13 images (2 of 2)]
[im 6/47  soft-tissue]
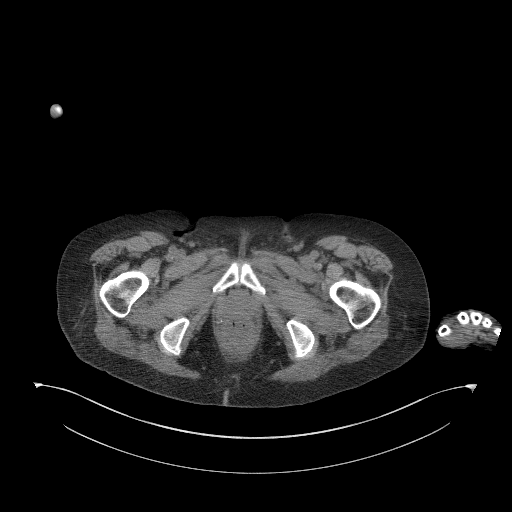
[im 6/47  bone]
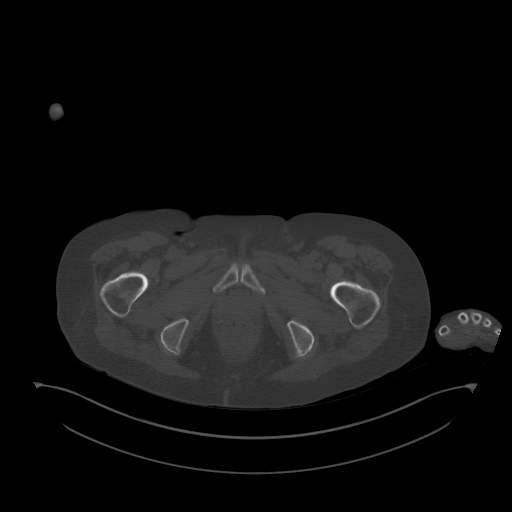
[im 11/47  soft-tissue]
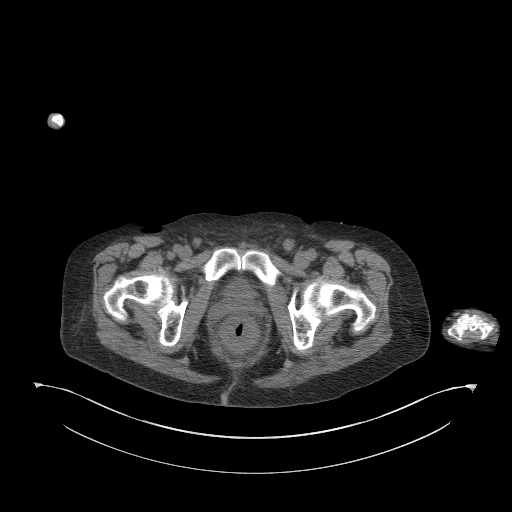
[im 16/47  soft-tissue]
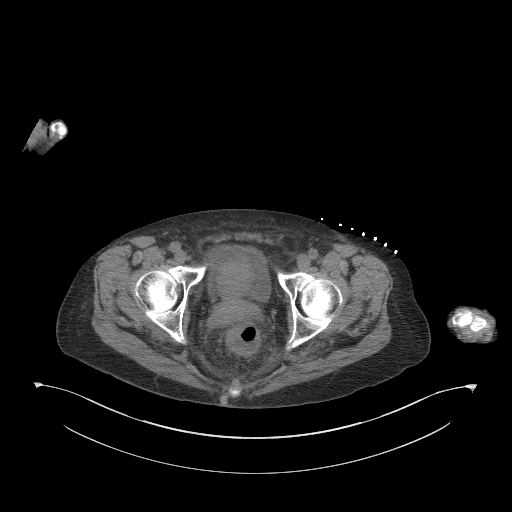
[im 21/47  soft-tissue]
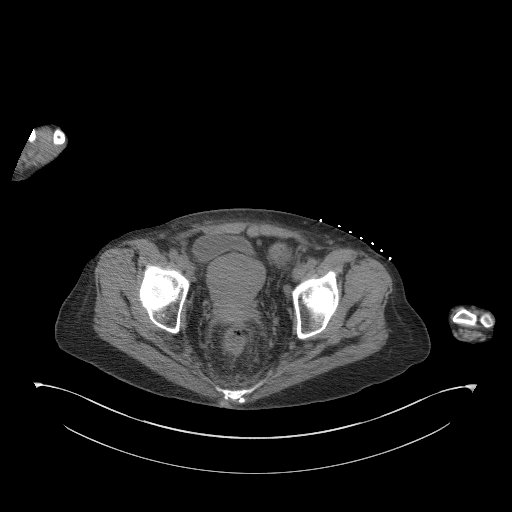
[im 26/47  soft-tissue]
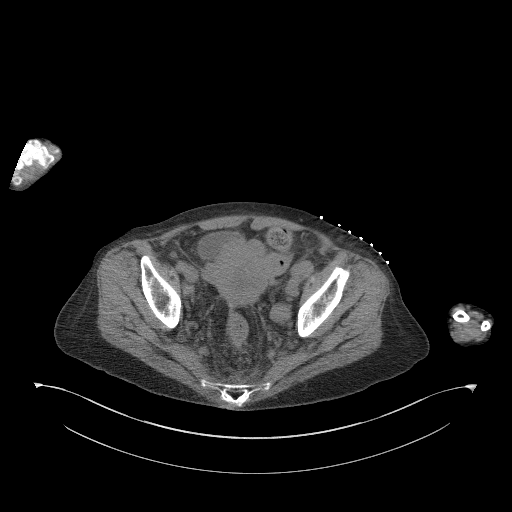
[im 26/47  lung]
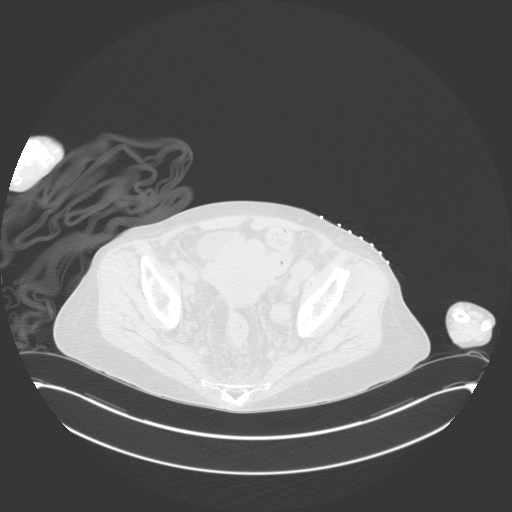
[im 31/47  soft-tissue]
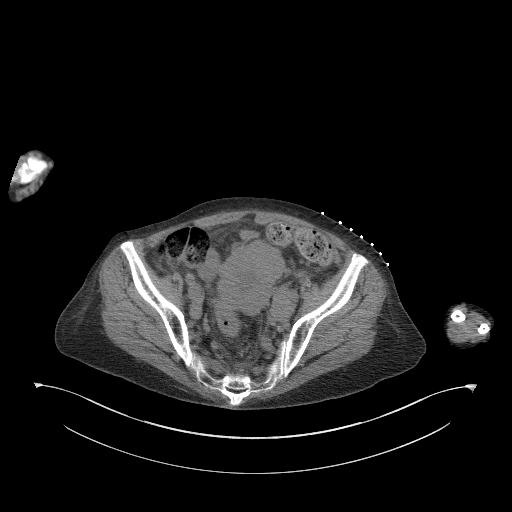
[im 31/47  lung]
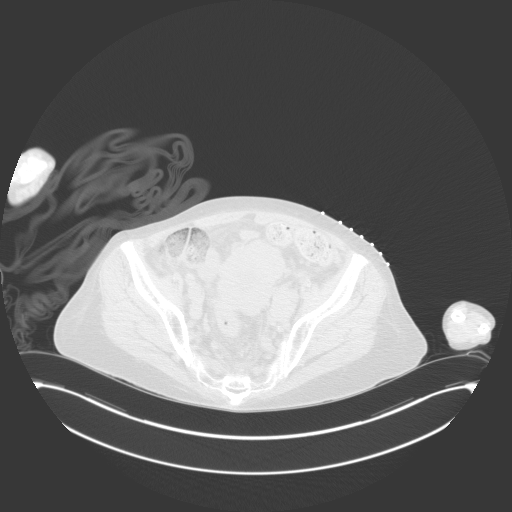
[im 36/47  soft-tissue]
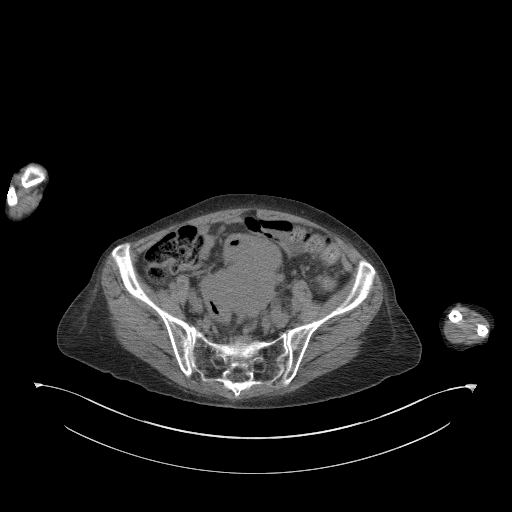
[im 36/47  lung]
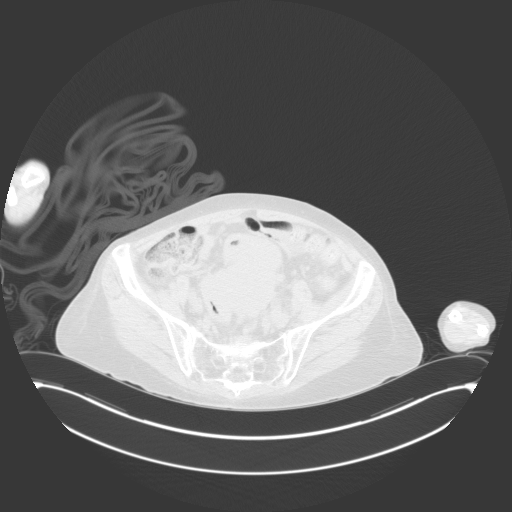
[im 41/47  soft-tissue]
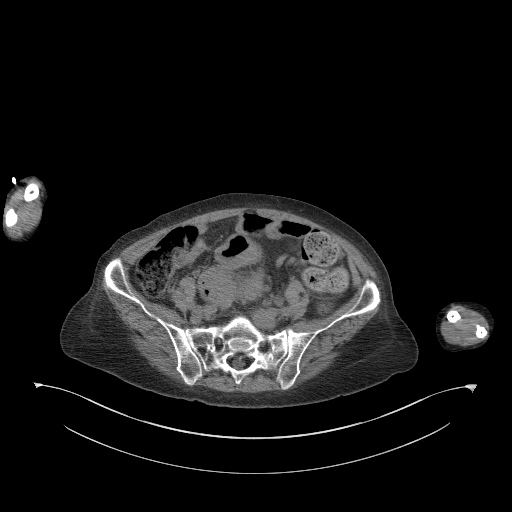
[im 41/47  lung]
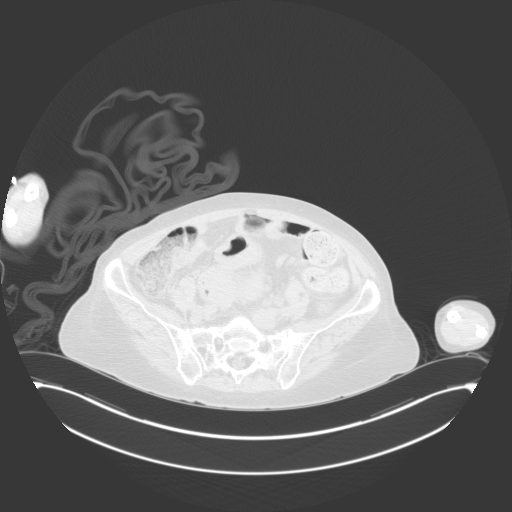

[13 of 32 positions shown; findings below may reference images not displayed]

EXAM:
CT-guided biopsy of left external iliac chain lymph node.

MEDICATIONS:
None.

ANESTHESIA/SEDATION:
Moderate (conscious) sedation was employed during this procedure. A
total of Versed 2 mg and Fentanyl 50 mcg was administered
intravenously.

Moderate Sedation Time: 10 minutes. The patient's level of
consciousness and vital signs were monitored continuously by
radiology nursing throughout the procedure under my direct
supervision.

COMPLICATIONS:
None immediate.

PROCEDURE:
Informed written consent was obtained from the patient after a
thorough discussion of the procedural risks, benefits and
alternatives. All questions were addressed. Maximal Sterile Barrier
Technique was utilized including caps, mask, sterile gowns, sterile
gloves, sterile drape, hand hygiene and skin antiseptic. A timeout
was performed prior to the initiation of the procedure.

Patient position supine on the CT table.

Left inguinal skin prepped and draped in usual sterile fashion.

Following local lidocaine administration, 17 gauge introducer needle
was advanced into the enlarged left external iliac chain lymph node
utilizing CT guidance.

Three cores were obtained from the enlarged left external iliac
chain lymph node and sent to pathology in formalin.

Needle removed and hemostasis achieved with manual compression.
IMPRESSION: CT-guided biopsy of enlarged left external iliac chain lymph node as
above.

## 2020-08-30 MED ORDER — FENTANYL CITRATE (PF) 100 MCG/2ML IJ SOLN
INTRAMUSCULAR | Status: AC | PRN
  Administered 2020-08-30: 50 ug via INTRAVENOUS

## 2020-08-30 MED ORDER — FENTANYL CITRATE (PF) 100 MCG/2ML IJ SOLN
INTRAMUSCULAR | Status: AC
  Filled 2020-08-30: qty 2

## 2020-08-30 MED ORDER — PEG 3350-KCL-NA BICARB-NACL 420 G PO SOLR
4000.0000 mL | Freq: Once | ORAL | Status: AC
  Administered 2020-08-30: 4000 mL via ORAL
  Filled 2020-08-30: qty 4000

## 2020-08-30 MED ORDER — MIDAZOLAM HCL 2 MG/2ML IJ SOLN
INTRAMUSCULAR | Status: AC
  Filled 2020-08-30: qty 4

## 2020-08-30 MED ORDER — POLYETHYLENE GLYCOL 3350 17 G PO PACK: 17.0000 g | PACK | Freq: Two times a day (BID) | ORAL | Status: DC | PRN

## 2020-08-30 MED ORDER — LIDOCAINE HCL (PF) 1 % IJ SOLN
INTRAMUSCULAR | Status: AC | PRN
  Administered 2020-08-30: 10 mL

## 2020-08-30 MED ORDER — SODIUM CHLORIDE 0.9 % IV SOLN
INTRAVENOUS | Status: AC
  Filled 2020-08-30: qty 250

## 2020-08-30 MED ORDER — MIDAZOLAM HCL 2 MG/2ML IJ SOLN
INTRAMUSCULAR | Status: AC | PRN
  Administered 2020-08-30 (×2): 1 mg via INTRAVENOUS

## 2020-08-30 NOTE — Procedures (Signed)
Interventional Radiology Procedure Note  Procedure: CT guided biopsy of left external iliac chain lymph node.  Indication: Pelvic mass with metastatic lymph adenopathy  Findings: Please refer to procedural dictation for full description.  Complications: None  EBL: < 10 mL  Miachel Roux, MD [phone]

## 2020-08-30 NOTE — Progress Notes (Signed)
MEDICATION-RELATED CONSULT NOTE   IR Procedure Consult - Anticoagulant/Antiplatelet PTA/Inpatient Med List Review by Pharmacist    Procedure: CT guided biopsy of left external iliac chain lymph node    Completed: 08/30/20 at ~0930  Post-Procedural bleeding risk per IR MD assessment:  standard  Antithrombotic medications on inpatient or PTA profile prior to procedure:    - heparin 5000 units q8h SQ  Recommended restart time per IR Post-Procedure Guidelines:   - at least 6 hrs after procedure or at next standard dose interval - Of note, IR team already scheduled heparin SQ to start tonight (4/13) at 2200    Plan:     - continue with current order for heparin SQ to start at 2200 on 4/13  Dia Sitter, PharmD, BCPS 08/30/2020 9:52 AM

## 2020-08-30 NOTE — Consult Note (Signed)
Referring Provider: [facility] Primary Care Physician:  Jani Gravel, MD Primary Gastroenterologist:  [facility] Vicie Mutters, PA-C)  Reason for Consultation: Abnormal CT of sigmoid colon  HPI: Teresa Oliver is a 75 y.o. female with recently diagnosed pelvic mass and left hydroureternephrosis presenting for consultation of abnormal CT of the sigmoid colon.  Patient was recently seen at [facility] by Vicie Mutters, PA-C, for abdominal pain and constipation.  CT was ordered, which revealed a pelvic mass measuring 7.5 x 4.8 cm.  contiguous with the sigmoid colon and uterus with loss of fat planes. Site of origin is unclear, may be ovarian, uterine, or colonic. Wall thickening of the sigmoid colon distal to the left pelvic mass with pericolonic edema, suspicious for colitis. There is a moderate to large amount of stool in the more proximal colon, suggesting pelvic mass may be causing delayed colonic transit.  CT was ordered without contrast, given renal function.  Patient reports poor appetite over the past month and has had weight loss of approximately 10 pounds since February 2022.  She denies any nausea or vomiting.  She states she is not currently having any abdominal pain, though was previously having lower abdominal pain.  She reports recent constipation and has been taking MiraLAX.  She denies any melena or hematochezia.  She has noted some GERD recently as well. Denies dysphagia.  She denies family history of colon cancer or gastrointestinal malignancy.  She states her last colonoscopy was ~10 years ago, and she believes she had a benign polyp.  Denies aspirin, NSAID, or blood thinner use.  Past Medical History:  Diagnosis Date  . Anemia    chronic  . Hyperlipemia   . Hypertension   . Irritable bowel syndrome (IBS)     Past Surgical History:  Procedure Laterality Date  . BREAST EXCISIONAL BIOPSY Left    1985 benign  . HERNIA REPAIR      Prior to Admission medications   Medication Sig  Start Date End Date Taking? Authorizing Provider  cholecalciferol (VITAMIN D3) 25 MCG (1000 UNIT) tablet Take 1,000 Units by mouth daily.   Yes [provider]  Ferrous Sulfate (IRON PO) Take 1 tablet by mouth daily.   Yes [provider]  losartan-hydrochlorothiazide (HYZAAR) 100-25 MG tablet Take 1 tablet by mouth daily. 06/20/20  Yes [provider]  pantoprazole (PROTONIX) 40 MG tablet Take 1 tablet by mouth every morning. 08/25/20  Yes [provider]  pravastatin (PRAVACHOL) 80 MG tablet Take 80 mg by mouth every evening. 06/19/20  Yes [provider]    Scheduled Meds: . fentaNYL      . heparin  5,000 Units Subcutaneous Q8H  . midazolam      . polyethylene glycol-electrolytes  4,000 mL Oral Once  . senna-docusate  1 tablet Oral BID  . sodium chloride flush  3 mL Intravenous Q12H   Continuous Infusions: . sodium chloride     PRN Meds:.acetaminophen **OR** acetaminophen, ondansetron **OR** ondansetron (ZOFRAN) IV, oxyCODONE, polyethylene glycol  Allergies as of 08/28/2020  . (No Known Allergies)    Family History  Problem Relation Age of Onset  . Stomach cancer Mother   . Dementia Mother   . Heart attack Father   . Prostate cancer Brother     Social History   Socioeconomic History  . Marital status: Married    Spouse name: Not on file  . Number of children: Not on file  . Years of education: Not on file  .  Highest education level: Not on file  Occupational History  . Not on file  Tobacco Use  . Smoking status: Never Smoker  . Smokeless tobacco: Never Used  Substance and Sexual Activity  . Alcohol use: Not Currently  . Drug use: Not Currently  . Sexual activity: Not on file  Other Topics Concern  . Not on file  Social History Narrative  . Not on file   Social Determinants of Health   Financial Resource Strain: Not on file  Food Insecurity: Not on file  Transportation Needs: Not on file  Physical Activity: Not on  file  Stress: Not on file  Social Connections: Not on file  Intimate Partner Violence: Not on file    Review of Systems: Review of Systems  Constitutional: Positive for weight loss. Negative for chills and fever.  HENT: Negative for hearing loss and tinnitus.   Eyes: Negative for pain and redness.  Respiratory: Negative for cough and shortness of breath.   Cardiovascular: Negative for chest pain and palpitations.  Gastrointestinal: Positive for abdominal pain and constipation. Negative for blood in stool, diarrhea, heartburn, melena, nausea and vomiting.  Genitourinary: Negative for flank pain and hematuria.  Musculoskeletal: Negative for falls and joint pain.  Skin: Negative for itching and rash.  Neurological: Negative for seizures and loss of consciousness.  Endo/Heme/Allergies: Negative for polydipsia. Does not bruise/bleed easily.  Psychiatric/Behavioral: Negative for substance abuse. The patient is not nervous/anxious.     Physical Exam: Vital signs: Vitals:   08/30/20 0850 08/30/20 0951  BP: (!) 85/61 103/75  Pulse: 78 84  Resp: 16 16  Temp:  97.6 F (36.4 C)  SpO2: 100% 98%   Last BM Date: 08/26/20  Physical Exam Vitals reviewed.  Constitutional:      General: She is not in acute distress. HENT:     Head: Normocephalic and atraumatic.     Nose: Nose normal. No congestion.     Mouth/Throat:     Mouth: Mucous membranes are moist.     Pharynx: Oropharynx is clear.  Eyes:     General: No scleral icterus.    Extraocular Movements: Extraocular movements intact.     Conjunctiva/sclera: Conjunctivae normal.  Cardiovascular:     Rate and Rhythm: Normal rate and regular rhythm.     Pulses: Normal pulses.  Pulmonary:     Effort: Pulmonary effort is normal. No respiratory distress.     Breath sounds: Normal breath sounds.  Abdominal:     General: Bowel sounds are normal. There is no distension.     Palpations: Abdomen is soft.     Tenderness: There is no abdominal  tenderness. There is no guarding or rebound.     Hernia: No hernia is present.  Musculoskeletal:        General: No swelling or tenderness.     Cervical back: Normal range of motion and neck supple.  Skin:    General: Skin is warm and dry.  Neurological:     General: No focal deficit present.     Mental Status: She is alert and oriented to person, place, and time.  Psychiatric:        Mood and Affect: Mood normal.        Behavior: Behavior normal.     GI:  Lab Results: Recent Labs    08/28/20 1648 08/29/20 0538 08/30/20 0653  WBC 15.6* 13.6* 13.0*  HGB 8.7* 8.8* 9.4*  HCT 26.1* 27.2* 28.9*  PLT 435* 425* 445*   BMET  Recent Labs    08/29/20 0538 08/29/20 1307 08/30/20 0653  NA 119* 120* 118*  K 4.0 4.2 4.0  CL 86* 86* 86*  CO2 23 21* 21*  GLUCOSE 125* 136* 122*  BUN 16 15 14   CREATININE 1.28* 1.20* 1.15*  CALCIUM 8.6* 8.8* 8.5*   LFT Recent Labs    08/30/20 0653  PROT 6.7  ALBUMIN 2.7*  AST 24  ALT 18  ALKPHOS 87  BILITOT 0.8   PT/INR Recent Labs    08/30/20 0653  LABPROT 12.7  INR 1.0     Studies/Results: CT ABDOMEN PELVIS WO CONTRAST  Result Date: 08/28/2020 CLINICAL DATA:  Abdominal pain, acute, nonlocalized EXAM: CT ABDOMEN AND PELVIS WITHOUT CONTRAST TECHNIQUE: Multidetector CT imaging of the abdomen and pelvis was performed following the standard protocol without IV contrast. COMPARISON:  Radiograph 08/25/2020 FINDINGS: Lower chest: Lung bases are clear. No focal consolidation or pleural fluid. Small hiatal hernia. Hepatobiliary: No focal liver abnormality is seen. No gallstones, gallbladder wall thickening, or biliary dilatation. Pancreas: No ductal dilatation or inflammation. Spleen: Normal in size without focal abnormality. Adrenals/Urinary Tract: No adrenal nodule. Minimal left hydronephrosis. Left ureter is dilated to the pelvis. No renal or ureteral stone. No significant perinephric edema. No right hydronephrosis. No right renal stone.  Decompressed right ureter. Urinary bladder is essentially empty and not well assessed. Stomach/Bowel: Bowel evaluation is limited in the absence of enteric contrast. There is wall thickening of the sigmoid colon with mild pericolonic edema. The mid sigmoid colon appears apposed to the uterus with obliteration of normal fat plane. Sigmoid colon is also difficult to delineate from a pelvic mass, which is also contiguous with the uterus. Please note that detailed assessment is limited on this noncontrast exam. There is a moderate to large volume of stool in the more proximal colon. No definite small bowel obstruction or inflammatory change. Appendix is normal. Small to moderate hiatal hernia. Vascular/Lymphatic: Multifocal adenopathy. Please note that detailed assessment of adenopathy is limited on this noncontrast exam. For instance, left periaortic node measures 16 mm short axis, series 2, image 30. Additional prominent and mildly enlarged lower retroperitoneal lymph nodes are seen. Right upper quadrant adenopathy with lymph nodes measuring 11 in 15 mm, series 2, image 36, likely in the mesentery. Right lower quadrant mesenteric node measures 13 mm, series 2, image 44. There additional enlarged ileocolic lymph nodes. 9 mm right external iliac node. Rounded soft tissue mass about the left pelvic sidewall measures 2.3 x 3.3 cm, may be adnexal mass or iliac adenopathy. There is a 17 mm left external iliac node, series 2, image 58. Prominent bilateral inguinal nodes. Reproductive: Pelvic mass just to the left of midline measures 7.5 x 4.8 cm, and is contiguous with the sigmoid colon and uterus. The ovary is not definitively visualized. Right ovary is not definitively seen. Other: Mild fat stranding in the pelvis as well as presacral region. Minimal omental edema without obvious omental thickening or mass. No ascites. No free air. Musculoskeletal: No acute osseous abnormality or focal bone lesion. No subcutaneous  abnormality. IMPRESSION: 1. Pelvic mass measuring 7.5 x 4.8 cm, contiguous with the sigmoid colon and uterus with loss of fat planes. Site of origin is unclear, may be ovarian, uterine, or colonic. Lack of contrast limits detailed assessment. 2. Above pelvic mass causes partial obstruction of the left ureter with mild left hydroureteronephrosis. 3. Multifocal adenopathy in the abdomen and pelvis, suspicious for metastatic disease, primarily in the mesentery and lower peritoneum and  pelvis. Please note that detailed assessment of adenopathy is limited on this noncontrast exam. Recommend oncology referral. 4. Wall thickening of the sigmoid colon distal to the left pelvic mass with pericolonic edema, suspicious for colitis. There is a moderate to large amount of stool in the more proximal colon, suggesting pelvic mass may be causing delayed colonic transit. 5. Small to moderate hiatal hernia. Electronically Signed   By: Keith Rake M.D.   On: 08/28/2020 18:30    Impression: Abnormal CT of the colon: CT 08/28/2020 pelvic mass contiguous with the sigmoid colon and uterus. Site of origin is unclear, may be ovarian, uterine, or colonic. Wall thickening of the sigmoid colon distal to the left pelvic mass with pericolonic edema. -Last colonoscopy approximately 10 years ago -Hemoglobin 9.4, stable -She underwent CT guided biopsy of left external iliac chain lymph node today, path pending  Plan: Recommend proceeding with colonoscopy tomorrow for further evaluation of sigmoid thickening.  I thoroughly discussed the procedure with the patient and patient's family at bedside to include nature, alternatives, benefits, and risks (including but not limited to bleeding, infection, perforation, anesthesia/cardiac and pulmonary complications).  Patient verbalized understanding and gave verbal consent to proceed with procedure.  Clear liquid diet, NPO after midnight.  [facility] will follow.    LOS: 2 days   Salley Slaughter  PA-C 08/30/2020, 12:42 PM  Contact #  [phone]

## 2020-08-30 NOTE — Progress Notes (Addendum)
Gynecologic Oncology   75 year old female currently admitted with 7.5 cm pelvic mass and multifocal adenopathy in the abdomen and pelvis found CT imaging. She is s/p IR biopsy of the left pelvic sidewall mass this am.   She is slightly drowsy from recent sedation. Answering questions. Voices concerns about being slightly confused about what has taken place. She states she slept well last night. She is hungry and states her taste for food has returned to normal. No nausea or emesis reported. She is concerned about her diet and decreased intake over the past several weeks. Continuing to have rectal mucus-like discharge. Denies new vaginal bleeding. Denies chest pain, dyspnea. Mild abdominal pain at the biopsy site.   Exam: Lungs clear. Heart regular in rate and rhythm. Active bowel sounds with mild tenderness at biopsy site. Dressing at the left abdominal biopsy site is dry and intact. No lower extrem edema noted.   I recapped the events that have taken place for the patient. Advised her a pap smear and ECC were taken in the office yesterday with Dr. Berline Lopes but since her pelvic examination was normal, we do not feel these will be diagnostic of the current process. Discussed the IR biopsy today from the left pelvic sidewall mass along with the recommendation for nephrostomy tube placement by IR possibly tomorrow under the recommendation of Dr. Louis Meckel. She verbalizes understanding. All questions answered.   Per Dr. Berline Lopes: -Agree with continued electrolyte management. Await results of the IR biopsy from today. Appreciate Urology consultation and recommendations. Colonoscopy to take place. Elevated CA 125 at 966 noted. If biopsy is positive for GYN malignancy, recommendation for consult with Medical Oncology.

## 2020-08-30 NOTE — Progress Notes (Signed)
PROGRESS NOTE  Teresa Oliver  DOB: 1946-04-18  PCP: Jani Gravel, MD OZY:[id number]  DOA: 08/28/2020  LOS: 2 days   Chief complaint: Abnormal blood work  Brief narrative: Teresa Oliver is a 75 y.o. female with PMH significant for HTN, HLD, IBS who was found to have electrolyte abnormality in outpatient lab work done by GI and hence sent to ED.  Patient also complained of constipation for 3 to 4 days, poor oral intake for last several weeks and 8 pounds of weight loss in 2 months. In the ED, CT abdomen/pelvis which showed a pelvic mass measuring 7.5 x 4.8 cm, contiguous with the sigmoid colon and uterus.  There was also partial obstruction of the left ureter with mild left hydroureteronephrosis.  Multifocal adenopathy in the abdomen and pelvis suspicious for metastatic disease. Also moderate to large amount of stool in proximal colon " suggesting pelvic mass may be causing delayed colonic transit." She was directly admitted to hospital service at [facility] long. Consultation obtained from gynecological oncology and urology. See below for details  Subjective: Patient was seen and examined this morning. Pleasant elderly Caucasian female.  Propped up in bed.  Not in distress. Pleasantly confused.  Assessment/Plan: Pelvic mass with multifocal adenopathy -Presented with constipation, poor oral intake, abnormal electrolytes -CT abdomen pelvis showed pelvic mass with multifocal adenopathy with secondary pression to the left ureter as well as bowel. -GYN oncology consultation obtained.   -4/13, patient underwent biopsy of the mass by IR.    Mild left hydroureteronephrosis -Secondary to compression from the pelvic mass. -Urology recommend for a nephrostomy tube placement.  Colonic wall thickening -Last colonoscopy 10 years ago. -GI plans for colonoscopy in 4/14.  Constipation History of IBS -Delayed colonic transit due to compression from pelvic mass -Senokot scheduled.  MiraLAX as  needed.  Elevated creatinine -No baseline available from past. -Presented with creatinine of 1.22. Roughly stable. Recent Labs    08/28/20 1648 08/28/20 2138 08/29/20 0538 08/29/20 1307 08/30/20 0653  BUN 18 17 16 15 14   CREATININE 1.22* 1.24* 1.28* 1.20* 1.15*   Hyponatremia -Likely hypovolemic due to poor oral intake. But also suspected SIADH because of coexisting malignancy. -Sodium level persistently under 120. -Serum osmolality low at 247, urine osmolality -Currently not on IV fluid. Recent Labs  Lab 08/28/20 1648 08/28/20 2138 08/29/20 0538 08/29/20 1307 08/30/20 0653  NA 117* 120* 119* 120* 118*   Hypokalemia/hypomagnesemia/hypophosphatemia -Low levels of potassium, magnesium and phosphorus due to poor oral intake -Aggressively replaced with improvement. Recent Labs  Lab 08/28/20 1648 08/28/20 1731 08/28/20 2138 08/29/20 0538 08/29/20 1307 08/30/20 0653  K 2.6*  --  2.6* 4.0 4.2 4.0  MG  --  1.5*  --  2.2  --  1.7  PHOS  --   --   --  1.8*  --  3.1   Acute metabolic encephalopathy -Patient cheerful but remains confused, unable to recollect recent details. -Multifactorial: Poor oral intake, abnormal electrolytes, malignancy -Continue monitor mental status change  Hypertension History of essential hypertension -Blood pressure as low as 85/61 this morning.  Improving now. -On losartan and hydrochlorothiazide at home.  Currently on hold because of low blood pressure -Continue to monitor blood pressure.  Diabetes mellitus type II -A1c 7 on 4/12  -Not on antidiabetic regimen.  Poor oral intake.  Would try dietary measures and target A1c less than 8. -Sliding scale insulin with Accu-Cheks Recent Labs  Lab 08/29/20 1156 08/29/20 1733 08/30/20 0013 08/30/20 0616 08/30/20 1241  GLUCAP 135* 113* 133* 138* 114*   Hyperlipidemia -On pravastatin..  Mobility: Encourage ambulation. Code Status:   Code Status: Full Code  Nutritional status: Body mass  index is 22.01 kg/m. Nutrition Problem: Moderate Malnutrition Etiology: chronic illness Signs/Symptoms: percent weight loss,energy intake < or equal to 75% for > or equal to 1 month,mild fat depletion,mild muscle depletion Diet Order            Diet clear liquid Room service appropriate? Yes; Fluid consistency: Thin  Diet effective now                 DVT prophylaxis: heparin injection 5,000 Units Start: 08/30/20 2200   Antimicrobials:  None Fluid: Not on IV fluid Consultants: Urology, gynecology, GI, radiology, general surgery Family Communication:  None at bedside  Status is: Inpatient  Remains inpatient appropriate because: Abnormal electrolytes, pending nephrostomy tube placement  Dispo: The patient is from: Home              Anticipated d/c is to: Home likely              Patient currently is not medically stable to d/c.   Difficult to place patient No       Infusions:  . sodium chloride      Scheduled Meds: . fentaNYL      . heparin  5,000 Units Subcutaneous Q8H  . midazolam      . senna-docusate  1 tablet Oral BID  . sodium chloride flush  3 mL Intravenous Q12H    Antimicrobials: Anti-infectives (From admission, onward)   None      PRN meds: acetaminophen **OR** acetaminophen, ondansetron **OR** ondansetron (ZOFRAN) IV, oxyCODONE, polyethylene glycol   Objective: Vitals:   08/30/20 0850 08/30/20 0951  BP: (!) 85/61 103/75  Pulse: 78 84  Resp: 16 16  Temp:  97.6 F (36.4 C)  SpO2: 100% 98%   No intake or output data in the 24 hours ending 08/30/20 1542 Filed Weights   08/28/20 1629  Weight: 51.1 kg   Weight change:  Body mass index is 22.01 kg/m.   Physical Exam: General exam: Pleasant elderly Caucasian female. Skin: No rashes, lesions or ulcers. HEENT: Atraumatic, normocephalic, no obvious bleeding Lungs: Clear to auscultation bilaterally CVS: Regular rate and rhythm, no murmur GI/Abd soft, nontender, nondistended, bowel sound  present CNS: Alert, awake, oriented x3 Psychiatry: Mood appropriate Extremities: No pedal edema, no calf tenderness  Data Review: I have personally reviewed the laboratory data and studies available.  Recent Labs  Lab 08/28/20 1648 08/29/20 0538 08/30/20 0653  WBC 15.6* 13.6* 13.0*  NEUTROABS 12.7* 11.4* 10.7*  HGB 8.7* 8.8* 9.4*  HCT 26.1* 27.2* 28.9*  MCV 63.7* 65.5* 65.8*  PLT 435* 425* 445*   Recent Labs  Lab 08/28/20 1648 08/28/20 1731 08/28/20 2138 08/29/20 0538 08/29/20 1307 08/30/20 0653  NA 117*  --  120* 119* 120* 118*  K 2.6*  --  2.6* 4.0 4.2 4.0  CL 80*  --  83* 86* 86* 86*  CO2 21*  --  21* 23 21* 21*  GLUCOSE 135*  --  130* 125* 136* 122*  BUN 18  --  17 16 15 14   CREATININE 1.22*  --  1.24* 1.28* 1.20* 1.15*  CALCIUM 8.8*  --  8.6* 8.6* 8.8* 8.5*  MG  --  1.5*  --  2.2  --  1.7  PHOS  --   --   --  1.8*  --  3.1  F/u labs ordered. Unresulted Labs (From admission, onward)          Start     Ordered   09/04/20 0000  Prealbumin  Weekly,   R      08/30/20 1430   08/30/20 0500  CBC with Differential/Platelet  Tomorrow morning,   R        08/29/20 1318   08/29/20 8648  Basic metabolic panel  Daily,   R      08/28/20 2254   08/29/20 0500  CBC with Differential/Platelet  Daily,   R      08/28/20 2254   08/29/20 0500  Magnesium  Daily,   R      08/28/20 2254   08/29/20 0500  Phosphorus  Daily,   R      08/28/20 2254   08/28/20 2255  Sodium, urine, random  Once,   R        08/28/20 2254   08/28/20 2255  Creatinine, urine, random  Once,   R        08/28/20 2254   08/28/20 2255  Osmolality, urine  Once,   R        08/28/20 2254          Signed, Terrilee Croak, MD [facility] 08/30/2020

## 2020-08-30 NOTE — H&P (View-Only) (Signed)
Referring Provider: [facility] Primary Care Physician:  Jani Gravel, MD Primary Gastroenterologist:  [facility] Vicie Mutters, PA-C)  Reason for Consultation: Abnormal CT of sigmoid colon  HPI: Teresa Oliver is a 75 y.o. female with recently diagnosed pelvic mass and left hydroureternephrosis presenting for consultation of abnormal CT of the sigmoid colon.  Patient was recently seen at [facility] by Vicie Mutters, PA-C, for abdominal pain and constipation.  CT was ordered, which revealed a pelvic mass measuring 7.5 x 4.8 cm.  contiguous with the sigmoid colon and uterus with loss of fat planes. Site of origin is unclear, may be ovarian, uterine, or colonic. Wall thickening of the sigmoid colon distal to the left pelvic mass with pericolonic edema, suspicious for colitis. There is a moderate to large amount of stool in the more proximal colon, suggesting pelvic mass may be causing delayed colonic transit.  CT was ordered without contrast, given renal function.  Patient reports poor appetite over the past month and has had weight loss of approximately 10 pounds since February 2022.  She denies any nausea or vomiting.  She states she is not currently having any abdominal pain, though was previously having lower abdominal pain.  She reports recent constipation and has been taking MiraLAX.  She denies any melena or hematochezia.  She has noted some GERD recently as well. Denies dysphagia.  She denies family history of colon cancer or gastrointestinal malignancy.  She states her last colonoscopy was ~10 years ago, and she believes she had a benign polyp.  Denies aspirin, NSAID, or blood thinner use.  Past Medical History:  Diagnosis Date  . Anemia    chronic  . Hyperlipemia   . Hypertension   . Irritable bowel syndrome (IBS)     Past Surgical History:  Procedure Laterality Date  . BREAST EXCISIONAL BIOPSY Left    1985 benign  . HERNIA REPAIR      Prior to Admission medications   Medication Sig  Start Date End Date Taking? Authorizing Provider  cholecalciferol (VITAMIN D3) 25 MCG (1000 UNIT) tablet Take 1,000 Units by mouth daily.   Yes [provider]  Ferrous Sulfate (IRON PO) Take 1 tablet by mouth daily.   Yes [provider]  losartan-hydrochlorothiazide (HYZAAR) 100-25 MG tablet Take 1 tablet by mouth daily. 06/20/20  Yes [provider]  pantoprazole (PROTONIX) 40 MG tablet Take 1 tablet by mouth every morning. 08/25/20  Yes [provider]  pravastatin (PRAVACHOL) 80 MG tablet Take 80 mg by mouth every evening. 06/19/20  Yes [provider]    Scheduled Meds: . fentaNYL      . heparin  5,000 Units Subcutaneous Q8H  . midazolam      . polyethylene glycol-electrolytes  4,000 mL Oral Once  . senna-docusate  1 tablet Oral BID  . sodium chloride flush  3 mL Intravenous Q12H   Continuous Infusions: . sodium chloride     PRN Meds:.acetaminophen **OR** acetaminophen, ondansetron **OR** ondansetron (ZOFRAN) IV, oxyCODONE, polyethylene glycol  Allergies as of 08/28/2020  . (No Known Allergies)    Family History  Problem Relation Age of Onset  . Stomach cancer Mother   . Dementia Mother   . Heart attack Father   . Prostate cancer Brother     Social History   Socioeconomic History  . Marital status: Married    Spouse name: Not on file  . Number of children: Not on file  . Years of education: Not on file  .  Highest education level: Not on file  Occupational History  . Not on file  Tobacco Use  . Smoking status: Never Smoker  . Smokeless tobacco: Never Used  Substance and Sexual Activity  . Alcohol use: Not Currently  . Drug use: Not Currently  . Sexual activity: Not on file  Other Topics Concern  . Not on file  Social History Narrative  . Not on file   Social Determinants of Health   Financial Resource Strain: Not on file  Food Insecurity: Not on file  Transportation Needs: Not on file  Physical Activity: Not on  file  Stress: Not on file  Social Connections: Not on file  Intimate Partner Violence: Not on file    Review of Systems: Review of Systems  Constitutional: Positive for weight loss. Negative for chills and fever.  HENT: Negative for hearing loss and tinnitus.   Eyes: Negative for pain and redness.  Respiratory: Negative for cough and shortness of breath.   Cardiovascular: Negative for chest pain and palpitations.  Gastrointestinal: Positive for abdominal pain and constipation. Negative for blood in stool, diarrhea, heartburn, melena, nausea and vomiting.  Genitourinary: Negative for flank pain and hematuria.  Musculoskeletal: Negative for falls and joint pain.  Skin: Negative for itching and rash.  Neurological: Negative for seizures and loss of consciousness.  Endo/Heme/Allergies: Negative for polydipsia. Does not bruise/bleed easily.  Psychiatric/Behavioral: Negative for substance abuse. The patient is not nervous/anxious.     Physical Exam: Vital signs: Vitals:   08/30/20 0850 08/30/20 0951  BP: (!) 85/61 103/75  Pulse: 78 84  Resp: 16 16  Temp:  97.6 F (36.4 C)  SpO2: 100% 98%   Last BM Date: 08/26/20  Physical Exam Vitals reviewed.  Constitutional:      General: She is not in acute distress. HENT:     Head: Normocephalic and atraumatic.     Nose: Nose normal. No congestion.     Mouth/Throat:     Mouth: Mucous membranes are moist.     Pharynx: Oropharynx is clear.  Eyes:     General: No scleral icterus.    Extraocular Movements: Extraocular movements intact.     Conjunctiva/sclera: Conjunctivae normal.  Cardiovascular:     Rate and Rhythm: Normal rate and regular rhythm.     Pulses: Normal pulses.  Pulmonary:     Effort: Pulmonary effort is normal. No respiratory distress.     Breath sounds: Normal breath sounds.  Abdominal:     General: Bowel sounds are normal. There is no distension.     Palpations: Abdomen is soft.     Tenderness: There is no abdominal  tenderness. There is no guarding or rebound.     Hernia: No hernia is present.  Musculoskeletal:        General: No swelling or tenderness.     Cervical back: Normal range of motion and neck supple.  Skin:    General: Skin is warm and dry.  Neurological:     General: No focal deficit present.     Mental Status: She is alert and oriented to person, place, and time.  Psychiatric:        Mood and Affect: Mood normal.        Behavior: Behavior normal.     GI:  Lab Results: Recent Labs    08/28/20 1648 08/29/20 0538 08/30/20 0653  WBC 15.6* 13.6* 13.0*  HGB 8.7* 8.8* 9.4*  HCT 26.1* 27.2* 28.9*  PLT 435* 425* 445*   BMET  Recent Labs    08/29/20 0538 08/29/20 1307 08/30/20 0653  NA 119* 120* 118*  K 4.0 4.2 4.0  CL 86* 86* 86*  CO2 23 21* 21*  GLUCOSE 125* 136* 122*  BUN 16 15 14   CREATININE 1.28* 1.20* 1.15*  CALCIUM 8.6* 8.8* 8.5*   LFT Recent Labs    08/30/20 0653  PROT 6.7  ALBUMIN 2.7*  AST 24  ALT 18  ALKPHOS 87  BILITOT 0.8   PT/INR Recent Labs    08/30/20 0653  LABPROT 12.7  INR 1.0     Studies/Results: CT ABDOMEN PELVIS WO CONTRAST  Result Date: 08/28/2020 CLINICAL DATA:  Abdominal pain, acute, nonlocalized EXAM: CT ABDOMEN AND PELVIS WITHOUT CONTRAST TECHNIQUE: Multidetector CT imaging of the abdomen and pelvis was performed following the standard protocol without IV contrast. COMPARISON:  Radiograph 08/25/2020 FINDINGS: Lower chest: Lung bases are clear. No focal consolidation or pleural fluid. Small hiatal hernia. Hepatobiliary: No focal liver abnormality is seen. No gallstones, gallbladder wall thickening, or biliary dilatation. Pancreas: No ductal dilatation or inflammation. Spleen: Normal in size without focal abnormality. Adrenals/Urinary Tract: No adrenal nodule. Minimal left hydronephrosis. Left ureter is dilated to the pelvis. No renal or ureteral stone. No significant perinephric edema. No right hydronephrosis. No right renal stone.  Decompressed right ureter. Urinary bladder is essentially empty and not well assessed. Stomach/Bowel: Bowel evaluation is limited in the absence of enteric contrast. There is wall thickening of the sigmoid colon with mild pericolonic edema. The mid sigmoid colon appears apposed to the uterus with obliteration of normal fat plane. Sigmoid colon is also difficult to delineate from a pelvic mass, which is also contiguous with the uterus. Please note that detailed assessment is limited on this noncontrast exam. There is a moderate to large volume of stool in the more proximal colon. No definite small bowel obstruction or inflammatory change. Appendix is normal. Small to moderate hiatal hernia. Vascular/Lymphatic: Multifocal adenopathy. Please note that detailed assessment of adenopathy is limited on this noncontrast exam. For instance, left periaortic node measures 16 mm short axis, series 2, image 30. Additional prominent and mildly enlarged lower retroperitoneal lymph nodes are seen. Right upper quadrant adenopathy with lymph nodes measuring 11 in 15 mm, series 2, image 36, likely in the mesentery. Right lower quadrant mesenteric node measures 13 mm, series 2, image 44. There additional enlarged ileocolic lymph nodes. 9 mm right external iliac node. Rounded soft tissue mass about the left pelvic sidewall measures 2.3 x 3.3 cm, may be adnexal mass or iliac adenopathy. There is a 17 mm left external iliac node, series 2, image 58. Prominent bilateral inguinal nodes. Reproductive: Pelvic mass just to the left of midline measures 7.5 x 4.8 cm, and is contiguous with the sigmoid colon and uterus. The ovary is not definitively visualized. Right ovary is not definitively seen. Other: Mild fat stranding in the pelvis as well as presacral region. Minimal omental edema without obvious omental thickening or mass. No ascites. No free air. Musculoskeletal: No acute osseous abnormality or focal bone lesion. No subcutaneous  abnormality. IMPRESSION: 1. Pelvic mass measuring 7.5 x 4.8 cm, contiguous with the sigmoid colon and uterus with loss of fat planes. Site of origin is unclear, may be ovarian, uterine, or colonic. Lack of contrast limits detailed assessment. 2. Above pelvic mass causes partial obstruction of the left ureter with mild left hydroureteronephrosis. 3. Multifocal adenopathy in the abdomen and pelvis, suspicious for metastatic disease, primarily in the mesentery and lower peritoneum and  pelvis. Please note that detailed assessment of adenopathy is limited on this noncontrast exam. Recommend oncology referral. 4. Wall thickening of the sigmoid colon distal to the left pelvic mass with pericolonic edema, suspicious for colitis. There is a moderate to large amount of stool in the more proximal colon, suggesting pelvic mass may be causing delayed colonic transit. 5. Small to moderate hiatal hernia. Electronically Signed   By: Keith Rake M.D.   On: 08/28/2020 18:30    Impression: Abnormal CT of the colon: CT 08/28/2020 pelvic mass contiguous with the sigmoid colon and uterus. Site of origin is unclear, may be ovarian, uterine, or colonic. Wall thickening of the sigmoid colon distal to the left pelvic mass with pericolonic edema. -Last colonoscopy approximately 10 years ago -Hemoglobin 9.4, stable -She underwent CT guided biopsy of left external iliac chain lymph node today, path pending  Plan: Recommend proceeding with colonoscopy tomorrow for further evaluation of sigmoid thickening.  I thoroughly discussed the procedure with the patient and patient's family at bedside to include nature, alternatives, benefits, and risks (including but not limited to bleeding, infection, perforation, anesthesia/cardiac and pulmonary complications).  Patient verbalized understanding and gave verbal consent to proceed with procedure.  Clear liquid diet, NPO after midnight.  [facility] will follow.    LOS: 2 days   Salley Slaughter  PA-C 08/30/2020, 12:42 PM  Contact #  [phone]

## 2020-08-31 ENCOUNTER — Inpatient Hospital Stay (HOSPITAL_COMMUNITY): Payer: Medicare Other | Admitting: Certified Registered Nurse Anesthetist

## 2020-08-31 ENCOUNTER — Encounter (HOSPITAL_COMMUNITY): Payer: Self-pay | Admitting: Internal Medicine

## 2020-08-31 ENCOUNTER — Encounter (HOSPITAL_COMMUNITY)
Admission: EM | Disposition: A | Discharge status: Home / Self Care | Payer: Self-pay | Source: Home / Self Care | Attending: Internal Medicine

## 2020-08-31 DIAGNOSIS — R1900 Intra-abdominal and pelvic swelling, mass and lump, unspecified site: Secondary | ICD-10-CM | POA: Diagnosis not present

## 2020-08-31 DIAGNOSIS — R1084 Generalized abdominal pain: Secondary | ICD-10-CM | POA: Diagnosis present

## 2020-08-31 HISTORY — PX: COLONOSCOPY: SHX5424

## 2020-08-31 HISTORY — PX: SUBMUCOSAL TATTOO INJECTION: SHX6856

## 2020-08-31 HISTORY — PX: BIOPSY: SHX5522

## 2020-08-31 LAB — CBC WITH DIFFERENTIAL/PLATELET
Abs Immature Granulocytes: 0.61 10*3/uL — ABNORMAL HIGH (ref 0.00–0.07)
Basophils Absolute: 0.1 10*3/uL (ref 0.0–0.1)
Basophils Relative: 0 %
Eosinophils Absolute: 0.1 10*3/uL (ref 0.0–0.5)
Eosinophils Relative: 1 %
HCT: 28.2 % — ABNORMAL LOW (ref 36.0–46.0)
Hemoglobin: 9 g/dL — ABNORMAL LOW (ref 12.0–15.0)
Immature Granulocytes: 4 %
Lymphocytes Relative: 9 %
Lymphs Abs: 1.4 10*3/uL (ref 0.7–4.0)
MCH: 21.3 pg — ABNORMAL LOW (ref 26.0–34.0)
MCHC: 31.9 g/dL (ref 30.0–36.0)
MCV: 66.7 fL — ABNORMAL LOW (ref 80.0–100.0)
Monocytes Absolute: 1.4 10*3/uL — ABNORMAL HIGH (ref 0.1–1.0)
Monocytes Relative: 9 %
Neutro Abs: 12.9 10*3/uL — ABNORMAL HIGH (ref 1.7–7.7)
Neutrophils Relative %: 77 %
Platelets: 465 10*3/uL — ABNORMAL HIGH (ref 150–400)
RBC: 4.23 MIL/uL (ref 3.87–5.11)
RDW: 13.7 % (ref 11.5–15.5)
WBC: 16.5 10*3/uL — ABNORMAL HIGH (ref 4.0–10.5)
nRBC: 0 % (ref 0.0–0.2)

## 2020-08-31 LAB — MAGNESIUM: Magnesium: 1.7 mg/dL (ref 1.7–2.4)

## 2020-08-31 LAB — BASIC METABOLIC PANEL
Anion gap: 12 (ref 5–15)
BUN: 14 mg/dL (ref 8–23)
CO2: 20 mmol/L — ABNORMAL LOW (ref 22–32)
Calcium: 8.7 mg/dL — ABNORMAL LOW (ref 8.9–10.3)
Chloride: 93 mmol/L — ABNORMAL LOW (ref 98–111)
Creatinine, Ser: 1.16 mg/dL — ABNORMAL HIGH (ref 0.44–1.00)
GFR, Estimated: 49 mL/min — ABNORMAL LOW (ref >60)
Glucose, Bld: 96 mg/dL (ref 70–99)
Potassium: 3.7 mmol/L (ref 3.5–5.1)
Sodium: 125 mmol/L — ABNORMAL LOW (ref 135–145)

## 2020-08-31 LAB — GLUCOSE, CAPILLARY
Glucose-Capillary: 111 mg/dL — ABNORMAL HIGH (ref 70–99)
Glucose-Capillary: 74 mg/dL (ref 70–99)
Glucose-Capillary: 124 mg/dL — ABNORMAL HIGH (ref 70–99)
Glucose-Capillary: 64 mg/dL — ABNORMAL LOW (ref 70–99)

## 2020-08-31 LAB — SURGICAL PATHOLOGY

## 2020-08-31 LAB — OSMOLALITY, URINE: Osmolality, Ur: 397 mOsm/kg (ref 300–900)

## 2020-08-31 LAB — PHOSPHORUS: Phosphorus: 2.8 mg/dL (ref 2.5–4.6)

## 2020-08-31 SURGERY — COLONOSCOPY
Anesthesia: Monitor Anesthesia Care

## 2020-08-31 MED ORDER — MELATONIN 3 MG PO TABS
3.0000 mg | ORAL_TABLET | Freq: Every day | ORAL | Status: DC
  Administered 2020-08-31 – 2020-09-03 (×4): 3 mg via ORAL
  Filled 2020-08-31 (×4): qty 1

## 2020-08-31 MED ORDER — PROPOFOL 500 MG/50ML IV EMUL
INTRAVENOUS | Status: DC | PRN
  Administered 2020-08-31: 200 ug/kg/min via INTRAVENOUS

## 2020-08-31 MED ORDER — LACTATED RINGERS IV SOLN: INTRAVENOUS | Status: DC | PRN

## 2020-08-31 MED ORDER — LIDOCAINE HCL (CARDIAC) PF 100 MG/5ML IV SOSY
PREFILLED_SYRINGE | INTRAVENOUS | Status: DC | PRN
  Administered 2020-08-31: 40 mg via INTRAVENOUS

## 2020-08-31 MED ORDER — SODIUM CHLORIDE 0.9 % IV SOLN: INTRAVENOUS | Status: DC | PRN

## 2020-08-31 MED ORDER — PROPOFOL 10 MG/ML IV BOLUS
INTRAVENOUS | Status: DC | PRN
  Administered 2020-08-31: 20 mg via INTRAVENOUS
  Administered 2020-08-31: 30 mg via INTRAVENOUS

## 2020-08-31 NOTE — Care Management Important Message (Signed)
Important Message  Patient Details IM Letter given to the Patient Name: Teresa Oliver MRN: [id number] Date of Birth: 1946/01/30   Medicare Important Message Given:  Yes     Kerin Salen 08/31/2020, 8:41 AM

## 2020-08-31 NOTE — Interval H&P Note (Signed)
History and Physical Interval Note:  08/31/2020 11:50 AM  Teresa Oliver  has presented today for surgery, with the diagnosis of abdominal CT of sigmoid.  The various methods of treatment have been discussed with the patient and family. After consideration of risks, benefits and other options for treatment, the patient has consented to  Procedure(s): COLONOSCOPY (N/A) as a surgical intervention.  The patient's history has been reviewed, patient examined, no change in status, stable for surgery.  I have reviewed the patient's chart and labs.  Questions were answered to the patient's satisfaction.     Lear Ng

## 2020-08-31 NOTE — Anesthesia Postprocedure Evaluation (Signed)
Anesthesia Post Note  Patient: Teresa Oliver  Procedure(s) Performed: COLONOSCOPY (N/A ) BIOPSY SUBMUCOSAL TATTOO INJECTION     Patient location during evaluation: Endoscopy Anesthesia Type: MAC Level of consciousness: awake and sedated Pain management: pain level controlled Vital Signs Assessment: post-procedure vital signs reviewed and stable Respiratory status: spontaneous breathing Cardiovascular status: stable Postop Assessment: no apparent nausea or vomiting Anesthetic complications: no   No complications documented.  Last Vitals:  Vitals:   08/31/20 1315 08/31/20 1320  BP: (!) 87/50 106/73  Pulse: 80 81  Resp: 13 (!) 23  Temp:    SpO2: 100% 96%    Last Pain:  Vitals:   08/31/20 1320  TempSrc:   PainSc: 0-No pain                 John F Salome Arnt

## 2020-08-31 NOTE — Transfer of Care (Signed)
Immediate Anesthesia Transfer of Care Note  Patient: Teresa Oliver  Procedure(s) Performed: COLONOSCOPY (N/A ) BIOPSY SUBMUCOSAL TATTOO INJECTION  Patient Location: Endoscopy Unit  Anesthesia Type:MAC  Level of Consciousness: sedated and patient cooperative  Airway & Oxygen Therapy: Patient Spontanous Breathing and Patient connected to face mask oxygen  Post-op Assessment: Report given to RN, Post -op Vital signs reviewed and stable and Patient moving all extremities  Post vital signs: Reviewed and stable  Last Vitals:  Vitals Value Taken Time  BP    Temp    Pulse 81 08/31/20 1308  Resp 11 08/31/20 1308  SpO2 100 % 08/31/20 1308  Vitals shown include unvalidated device data.  Last Pain:  Vitals:   08/31/20 1047  TempSrc: Oral  PainSc: 0-No pain         Complications: No complications documented.

## 2020-08-31 NOTE — Brief Op Note (Signed)
Large mass that appears extrinsic causing partial obstruction of the sigmoid colon. I do not think it is a primary colon cancer but metastatic from pelvic malignancy. Biopsies taken and tattooed. See endopro note for details. Clear liquid diet. Advance per gyn onc recs (if no surgery planned tomorrow then ok to advance). Will sign off. Call if questions. Dr. Watt Climes on call tomorrow and this weekend if questions arise.

## 2020-08-31 NOTE — Anesthesia Preprocedure Evaluation (Addendum)
Anesthesia Evaluation    Airway Mallampati: I       Dental no notable dental hx.    Pulmonary neg pulmonary ROS,    Pulmonary exam normal        Cardiovascular hypertension, Pt. on medications Normal cardiovascular exam     Neuro/Psych negative neurological ROS  negative psych ROS   GI/Hepatic Neg liver ROS, GERD  Medicated and Controlled,  Endo/Other  negative endocrine ROS  Renal/GU   negative genitourinary   Musculoskeletal negative musculoskeletal ROS (+)   Abdominal Normal abdominal exam  (+)   Peds  Hematology  (+) Blood dyscrasia, anemia ,   Anesthesia Other Findings   Reproductive/Obstetrics                            Anesthesia Physical Anesthesia Plan  ASA: II  Anesthesia Plan: MAC   Post-op Pain Management:    Induction:   PONV Risk Score and Plan: Propofol infusion  Airway Management Planned: Natural Airway, Simple Face Mask and Nasal Cannula  Additional Equipment:   Intra-op Plan:   Post-operative Plan:   Informed Consent: I have reviewed the patients History and Physical, chart, labs and discussed the procedure including the risks, benefits and alternatives for the proposed anesthesia with the patient or authorized representative who has indicated his/her understanding and acceptance.       Plan Discussed with: CRNA  Anesthesia Plan Comments:         Anesthesia Quick Evaluation

## 2020-08-31 NOTE — Progress Notes (Signed)
Seen this AM along with the hospitalist.  No new complaints - was up a lot last night with diarrhea 2/2 bowel prep. Denies nausea or vomiting. No BRBPR. Hyponatremia slowly improving, Cr. 1.16 from 1.15, H&H stable Prealbumin 7.4 Colonoscopy planned for today Also noted plans for IR nephrostomy tube today vs tomorrow. Surgical path from L external lymph node pending. [facility] will follow.   Obie Dredge, PA-C [facility] Please see Amion for pager number during day hours 7:00am-4:30pm

## 2020-08-31 NOTE — Progress Notes (Signed)
PROGRESS NOTE  Teresa Oliver  DOB: [birth year]  PCP: Jani Gravel, MD JGG:[id number]  DOA: 08/28/2020  LOS: 3 days   Chief complaint: Abnormal blood work  Brief narrative: Teresa Oliver is a 75 y.o. female with PMH significant for HTN, HLD, IBS who was found to have electrolyte abnormality in outpatient lab work done by GI and hence sent to ED.  Teresa Oliver also complained of constipation for 3 to 4 days, poor oral intake for last several weeks and 8 pounds of weight loss in 2 months. In the ED, CT abdomen/pelvis which showed a pelvic mass measuring 7.5 x 4.8 cm, contiguous with the sigmoid colon and uterus. There was also partial obstruction of the left ureter with mild left hydroureteronephrosis.  Multifocal adenopathy in the abdomen and pelvis suspicious for metastatic disease. Also moderate to large amount of stool in proximal colon " suggesting pelvic mass may be causing delayed colonic transit." She was directly admitted to hospital service at [facility] long. Consultation obtained from gynecological oncology, urology, GI, IR.  4/13, underwent biopsy of the pelvic mass by IR 4/14, underwent colonoscopy and biopsy of the sigmoid colon See below for more details.  Subjective: Teresa Oliver was seen and examined this morning. Pleasant elderly Caucasian female.  Propped up in bed.  Not in distress. Pleasantly confused.  Assessment/Plan: Pelvic mass with multifocal adenopathy Mild left hydroureteronephrosis Partial obstruction of sigmoid colon -Presented with constipation, poor oral intake, abnormal electrolytes -CT abdomen pelvis showed pelvic mass with multifocal adenopathy with secondary compression to the left ureter as well as bowel. -GYN oncology, urology and GI consultation appreciated.   -4/13, Teresa Oliver underwent biopsy of the mass by IR.   -4/14, Teresa Oliver underwent colonoscopy and biopsy of the sigmoid colon. -Teresa Oliver seems to have metastatic disease of unknown primary causing partial  obstruction of the left ureter and sigmoid colon.  Per GYN oncology note from today, Teresa Oliver would need a left nephrostomy tube as well as colonic diversion. -According to her note, IR will do left nephrostomy tube tomorrow and general surgery may do a diverting ostomy next week.  Constipation History of IBS -Delayed colonic transit due to compression from pelvic mass -Senokot scheduled.  MiraLAX as needed.  Elevated creatinine -No baseline available from past. -Presented with creatinine of 1.22. Roughly stable. Recent Labs    08/28/20 1648 08/28/20 2138 08/29/20 0538 08/29/20 1307 08/30/20 0653 08/31/20 0533  BUN 18 17 16 15 14 14   CREATININE 1.22* 1.24* 1.28* 1.20* 1.15* 1.16*   Hyponatremia -Likely hypovolemic due to poor oral intake. But also suspected SIADH because of coexisting malignancy. -Sodium level improving today.  Not on IV fluid. Recent Labs  Lab 08/28/20 1648 08/28/20 2138 08/29/20 0538 08/29/20 1307 08/30/20 0653 08/31/20 0533  NA 117* 120* 119* 120* 118* 125*   Hypokalemia/hypomagnesemia/hypophosphatemia -Low levels of potassium, magnesium and phosphorus due to poor oral intake -Aggressively replaced with improvement. Recent Labs  Lab 08/28/20 1731 08/28/20 2138 08/29/20 0538 08/29/20 1307 08/30/20 0653 08/31/20 0533  K  --  2.6* 4.0 4.2 4.0 3.7  MG 1.5*  --  2.2  --  1.7 1.7  PHOS  --   --  1.8*  --  3.1 2.8   Acute metabolic encephalopathy -Teresa Oliver cheerful but remains confused, unable to recollect recent details. -Multifactorial: Poor oral intake, abnormal electrolytes, malignancy -Continue monitor mental status change  Hypertension History of essential hypertension-On losartan and hydrochlorothiazide at home.  Currently on hold because of low blood pressure -Continue to monitor blood  pressure.  Diabetes mellitus type II -A1c 7 on 4/12  -Not on antidiabetic regimen.  Poor oral intake.  Would try dietary measures and target A1c less  than 8. -Sliding scale insulin with Accu-Cheks Recent Labs  Lab 08/30/20 0616 08/30/20 1241 08/30/20 1846 08/30/20 2337 08/31/20 0634  GLUCAP 138* 114* 106* 111* 74   Hyperlipidemia -On pravastatin.  Mobility: Encourage ambulation. Code Status:   Code Status: Full Code  Nutritional status: Body mass index is 22.01 kg/m. Nutrition Problem: Moderate Malnutrition Etiology: chronic illness Signs/Symptoms: percent weight loss,energy intake < or equal to 75% for > or equal to 1 month,mild fat depletion,mild muscle depletion Diet Order            Diet clear liquid Room service appropriate? Yes; Fluid consistency: Thin  Diet effective now                 DVT prophylaxis: heparin injection 5,000 Units Start: 08/30/20 2200   Antimicrobials:  None Fluid: Not on IV fluid Consultants: Urology, gynecology, GI, IR, general surgery Family Communication:  None at bedside  Status is: Inpatient  Remains inpatient appropriate because:  pending nephrostomy tube placement, may need colostomy as well  Dispo: The Teresa Oliver is from: Home              Anticipated d/c is to: Home likely              Teresa Oliver currently is not medically stable to d/c.   Difficult to place Teresa Oliver No       Infusions:    Scheduled Meds: . heparin  5,000 Units Subcutaneous Q8H  . senna-docusate  1 tablet Oral BID  . sodium chloride flush  3 mL Intravenous Q12H    Antimicrobials: Anti-infectives (From admission, onward)   None      PRN meds: acetaminophen **OR** acetaminophen, ondansetron **OR** ondansetron (ZOFRAN) IV, oxyCODONE, polyethylene glycol   Objective: Vitals:   08/31/20 1328 08/31/20 1330  BP: 107/63 113/66  Pulse: 94 96  Resp: 20 19  Temp:    SpO2: 100% 100%    Intake/Output Summary (Last 24 hours) at 08/31/2020 1640 Last data filed at 08/31/2020 1305 Gross per 24 hour  Intake 4500 ml  Output --  Net 4500 ml   Filed Weights   08/28/20 1629  Weight: 51.1 kg   Weight  change:  Body mass index is 22.01 kg/m.   Physical Exam: General exam: Pleasant elderly Caucasian female. Skin: No rashes, lesions or ulcers. HEENT: Atraumatic, normocephalic, no obvious bleeding Lungs: Clear to auscultation bilaterally CVS: Regular rate and rhythm, no murmur GI/Abd soft, nontender, nondistended, bowel sound present CNS: Alert, awake, oriented x3 Psychiatry: Mood appropriate. Extremities: No pedal edema, no calf tenderness  Data Review: I have personally reviewed the laboratory data and studies available.  Recent Labs  Lab 08/28/20 1648 08/29/20 0538 08/30/20 0653 08/31/20 0533  WBC 15.6* 13.6* 13.0* 16.5*  NEUTROABS 12.7* 11.4* 10.7* 12.9*  HGB 8.7* 8.8* 9.4* 9.0*  HCT 26.1* 27.2* 28.9* 28.2*  MCV 63.7* 65.5* 65.8* 66.7*  PLT 435* 425* 445* 465*   Recent Labs  Lab 08/28/20 1731 08/28/20 2138 08/29/20 0538 08/29/20 1307 08/30/20 0653 08/31/20 0533  NA  --  120* 119* 120* 118* 125*  K  --  2.6* 4.0 4.2 4.0 3.7  CL  --  83* 86* 86* 86* 93*  CO2  --  21* 23 21* 21* 20*  GLUCOSE  --  130* 125* 136* 122* 96  BUN  --  17 16 15 14 14   CREATININE  --  1.24* 1.28* 1.20* 1.15* 1.16*  CALCIUM  --  8.6* 8.6* 8.8* 8.5* 8.7*  MG 1.5*  --  2.2  --  1.7 1.7  PHOS  --   --  1.8*  --  3.1 2.8    F/u labs ordered. Unresulted Labs (From admission, onward)          Start     Ordered   09/04/20 0000  Prealbumin  Weekly,   R      08/30/20 1430   08/30/20 0500  CBC with Differential/Platelet  Tomorrow morning,   R        08/29/20 1318   08/29/20 0938  Basic metabolic panel  Daily,   R      08/28/20 2254   08/29/20 0500  CBC with Differential/Platelet  Daily,   R      08/28/20 2254   08/29/20 0500  Magnesium  Daily,   R      08/28/20 2254   08/29/20 0500  Phosphorus  Daily,   R      08/28/20 2254          Signed, Terrilee Croak, MD [facility] 08/31/2020

## 2020-08-31 NOTE — Progress Notes (Signed)
IR consulted by Dr. Louis Meckel for possible image-guided left nephrostomy tube placement.  In summary, patient admitted to [facility] under [place] secondary to electrolyte derangements along with intermitted abdominal pain. She found to have pelvic mass on CT (unsure origin) along with mild left hydronephrosis, concerning for metastatic disease. Urology, GYN, and general surgery all consulted on admission. [facility] recommended IR biopsy for tissue diagnosis- patient underwent left external iliac chain lymph node biopsy in IR 08/30/2020, pathology pending. Urology recommended left nephrostomy tube placement- per Dr. Dwaine Gale await pathology results prior to placement (as patient's renal function has remained grossly unchanged- ?not sure if patient is truly obstructed). General surgery recommended GI consult for colonoscopy- patient underwent colonoscopy today which revealed mass to be extrinsic (biopsies taken).  Spoke with Dr. Berline Lopes today regarding this patient. At this time, not sure of primary, however she feels that patient will require urinary diversion either way. States pathology should be back tomorrow. She is requesting left nephrostomy tube placement (tomorrow if possible). Discussed case with Dr. Serafina Royals via telephone- again reiterated that patient's renal function has remained grossly unchanged- ?not sure if patient is truly obstructed. Dr. Serafina Royals to discuss case tomorrow AM with team prior to possible placement. Will make patient NPO at midnight in case moving forward tomorrow. Heparin held as well. If proceeding tomorrow (after discussion with Dr. Suttle/GYN/possibly urology), formal consult will follow.  Please call IR with questions/concerns.   Bea Graff Tracyann Duffell, PA-C 08/31/2020, 4:59 PM

## 2020-08-31 NOTE — Progress Notes (Signed)
GYN oncology progress note  Briefly, this is a patient who was in her usual state of health until 2 months ago when she developed weakness, fatigue, decreased appetite, rectal discharge, and weight loss.  She appears to have metastatic disease from an unknown primary and has undergone retroperitoneal lymph node biopsy as well as colonoscopy today, with what appeared to be an extrinsic mass causing partial obstruction of her sigmoid colon over a 20 cm distance with several biopsies taken.  Preliminary pathology looks to be consistent with rare GYN versus urothelial, additional immunohistochemistry stains pending and hopefully will be back tomorrow.  I spoke with the patient and her family about work-up that has been done thus far.  Presuming that this is a GYN cancer, we discussed typical upfront treatment including surgery and neoadjuvant chemotherapy.  Given the patient's malnutrition and tumor burden (sidewall involvement causing ureteral compression, mesenteric adenopathy), I do not think that she is a great candidate for upfront surgery.  I am concerned that she will have compromise of her kidney function due to mass compression and recommend proceeding with urinary diversion with a percutaneous nephrostomy tube on the left side.  Additionally, given the partial obstruction of her sigmoid colon, I am worried that she may develop colonic obstruction or worsening of her current fistulization, and recommend stool diversion.  Patient and her family were understanding of this and she is excepting of both a percutaneous nephrostomy tube as well as an ostomy.  I spoke with interventional radiology.  I let them know that we will have a preliminary path diagnosis tomorrow.  My hope is that we can have the percutaneous nephrostomy placed tomorrow.  I also spoke with general surgery and my hope is that early next week the patient can undergo diverting ostomy (either loop or end with mucous fistula to allow  decompression from her distal colon).  If pathology tomorrow confirms that this is most likely a GYN malignancy, then I will ask medical oncology to see the patient either tomorrow or early next week.  I appreciate her primary team's continued management of her electrolyte abnormalities.  From my standpoint, the patient can be on a regular diet after her percutaneous nephrostomy tube tomorrow until we have a plan in place for surgery next week.  Jeral Pinch MD Gynecologic Oncology

## 2020-08-31 NOTE — Op Note (Addendum)
[facility] Patient Name: Teresa Oliver Procedure Date: 08/31/2020 MRN: [id number] Attending MD: Lear Ng , MD Date of Birth: July 05, 1945 CSN: [id number] Age: 75 Admit Type: Outpatient Procedure:                Colonoscopy Indications:              Last colonoscopy: date unknown, Generalized                            abdominal pain, Abnormal CT of the GI tract, Colon                            mass Providers:                Lear Ng, MD, Truddie Coco, RN, Tyna Jaksch Technician Referring MD:             hospital team Medicines:                Propofol per Anesthesia, Monitored Anesthesia Care Complications:            No immediate complications. Estimated Blood Loss:     Estimated blood loss was minimal. Procedure:                Pre-Anesthesia Assessment:                           - Prior to the procedure, a History and Physical                            was performed, and patient medications and                            allergies were reviewed. The patient's tolerance of                            previous anesthesia was also reviewed. The risks                            and benefits of the procedure and the sedation                            options and risks were discussed with the patient.                            All questions were answered, and informed consent                            was obtained. Prior Anticoagulants: The patient has                            taken no previous anticoagulant or antiplatelet  agents. ASA Grade Assessment: II - A patient with                            mild systemic disease. After reviewing the risks                            and benefits, the patient was deemed in                            satisfactory condition to undergo the procedure.                           After obtaining informed consent, the colonoscope                            was  passed under direct vision. Throughout the                            procedure, the patient's blood pressure, pulse, and                            oxygen saturations were monitored continuously. The                            PCF-H190DL ([id number]) Olympus pediatric colonscope                            was introduced through the anus and advanced to the                            the transverse colon. The colonoscopy was performed                            with difficulty due to a partially obstructing mass                            and a tortuous colon. The patient tolerated the                            procedure well. The quality of the bowel                            preparation was fair. The rectum was photographed. Scope In: 12:36:53 PM Scope Out: 1:01:46 PM Scope Withdrawal Time: 0 hours 15 minutes 52 seconds  Total Procedure Duration: 0 hours 24 minutes 53 seconds  Findings:      The perianal and digital rectal examinations were normal.      An infiltrative and ulcerated partially obstructing large mass was found       in the sigmoid colon (appears to be an extrinsic mass infiltrating the       colon). The mass was partially circumferential (involving two-thirds of       the lumen circumference). The mass measured twenty cm in length. No       bleeding was present.  Biopsies were taken with a cold forceps for       histology. Estimated blood loss was minimal. Area was tattooed with an       injection of 4 mL of Spot (carbon black).      Internal hemorrhoids were found during endoscopy. The hemorrhoids were       small and Grade I (internal hemorrhoids that do not prolapse).      A large amount of liquid semi-liquid stool was found in the entire       colon, interfering with visualization. Impression:               - Preparation of the colon was fair.                           - Likely malignant partially obstructing tumor in                            the sigmoid colon  (appears extrinsic process).                            Biopsied. Tattooed.                           - Internal hemorrhoids.                           - Stool in the entire examined colon. Moderate Sedation:      Not Applicable - Patient had care per Anesthesia. Recommendation:           - Await pathology results.                           - Repeat colonoscopy for surveillance based on                            pathology results.                           - Clear liquid diet. Procedure Code(s):        --- Professional ---                           [phone], 52, Colonoscopy, flexible; with directed                            submucosal injection(s), any substance                           10932, 15, Colonoscopy, flexible; with biopsy,                            single or multiple Diagnosis Code(s):        --- Professional ---                           D49.0, Neoplasm of unspecified behavior of  digestive system                           R10.84, Generalized abdominal pain                           R93.3, Abnormal findings on diagnostic imaging of                            other parts of digestive tract                           K63.89, Other specified diseases of intestine                           K56.690, Other partial intestinal obstruction                           K64.0, First degree hemorrhoids CPT copyright 2019 [employer] Association. All rights reserved. The codes documented in this report are preliminary and upon coder review may  be revised to meet current compliance requirements. Lear Ng, MD 08/31/2020 1:14:44 PM This report has been signed electronically. Number of Addenda: 0

## 2020-09-01 ENCOUNTER — Other Ambulatory Visit: Payer: Self-pay | Admitting: Hematology and Oncology

## 2020-09-01 ENCOUNTER — Encounter (HOSPITAL_COMMUNITY): Payer: Self-pay | Admitting: Internal Medicine

## 2020-09-01 ENCOUNTER — Inpatient Hospital Stay (HOSPITAL_COMMUNITY): Payer: Medicare Other

## 2020-09-01 DIAGNOSIS — R1900 Intra-abdominal and pelvic swelling, mass and lump, unspecified site: Secondary | ICD-10-CM

## 2020-09-01 DIAGNOSIS — C55 Malignant neoplasm of uterus, part unspecified: Secondary | ICD-10-CM | POA: Insufficient documentation

## 2020-09-01 DIAGNOSIS — C549 Malignant neoplasm of corpus uteri, unspecified: Secondary | ICD-10-CM

## 2020-09-01 HISTORY — PX: IR IMAGING GUIDED PORT INSERTION: IMG5740

## 2020-09-01 LAB — CBC WITH DIFFERENTIAL/PLATELET
Abs Immature Granulocytes: 0.34 10*3/uL — ABNORMAL HIGH (ref 0.00–0.07)
Basophils Absolute: 0 10*3/uL (ref 0.0–0.1)
Basophils Relative: 0 %
Eosinophils Absolute: 0.2 10*3/uL (ref 0.0–0.5)
Eosinophils Relative: 2 %
HCT: 26.2 % — ABNORMAL LOW (ref 36.0–46.0)
Hemoglobin: 8.1 g/dL — ABNORMAL LOW (ref 12.0–15.0)
Immature Granulocytes: 3 %
Lymphocytes Relative: 8 %
Lymphs Abs: 1.1 10*3/uL (ref 0.7–4.0)
MCH: 20.8 pg — ABNORMAL LOW (ref 26.0–34.0)
MCHC: 30.9 g/dL (ref 30.0–36.0)
MCV: 67.4 fL — ABNORMAL LOW (ref 80.0–100.0)
Monocytes Absolute: 0.9 10*3/uL (ref 0.1–1.0)
Monocytes Relative: 7 %
Neutro Abs: 10.8 10*3/uL — ABNORMAL HIGH (ref 1.7–7.7)
Neutrophils Relative %: 80 %
Platelets: 407 10*3/uL — ABNORMAL HIGH (ref 150–400)
RBC: 3.89 MIL/uL (ref 3.87–5.11)
RDW: 13.9 % (ref 11.5–15.5)
WBC: 13.4 10*3/uL — ABNORMAL HIGH (ref 4.0–10.5)
nRBC: 0 % (ref 0.0–0.2)

## 2020-09-01 LAB — BASIC METABOLIC PANEL
Anion gap: 10 (ref 5–15)
BUN: 10 mg/dL (ref 8–23)
CO2: 19 mmol/L — ABNORMAL LOW (ref 22–32)
Calcium: 8.5 mg/dL — ABNORMAL LOW (ref 8.9–10.3)
Chloride: 96 mmol/L — ABNORMAL LOW (ref 98–111)
Creatinine, Ser: 1.17 mg/dL — ABNORMAL HIGH (ref 0.44–1.00)
GFR, Estimated: 49 mL/min — ABNORMAL LOW (ref >60)
Glucose, Bld: 99 mg/dL (ref 70–99)
Potassium: 3.9 mmol/L (ref 3.5–5.1)
Sodium: 125 mmol/L — ABNORMAL LOW (ref 135–145)

## 2020-09-01 LAB — MAGNESIUM: Magnesium: 1.5 mg/dL — ABNORMAL LOW (ref 1.7–2.4)

## 2020-09-01 LAB — CYTOLOGY - PAP
Comment: NEGATIVE
Diagnosis: NEGATIVE
High risk HPV: NEGATIVE

## 2020-09-01 LAB — GLUCOSE, CAPILLARY
Glucose-Capillary: 109 mg/dL — ABNORMAL HIGH (ref 70–99)
Glucose-Capillary: 102 mg/dL — ABNORMAL HIGH (ref 70–99)
Glucose-Capillary: 115 mg/dL — ABNORMAL HIGH (ref 70–99)
Glucose-Capillary: 130 mg/dL — ABNORMAL HIGH (ref 70–99)
Glucose-Capillary: 130 mg/dL — ABNORMAL HIGH (ref 70–99)

## 2020-09-01 LAB — SURGICAL PATHOLOGY

## 2020-09-01 LAB — PHOSPHORUS: Phosphorus: 2.6 mg/dL (ref 2.5–4.6)

## 2020-09-01 IMAGING — US IR IMAGING GUIDED PORT INSERTION
2 series · 3 of 3 positions shown · non-contrast
Comparison: None.

INDICATION: 75-year-old female with pelvic malignancy requiring central venous
access for chemotherapy.

EXAM:
IMPLANTED PORT A CATH PLACEMENT WITH ULTRASOUND AND FLUOROSCOPIC
GUIDANCE

[Series 1: (id) · 1 of 1 slices shown]
[im 1/1]
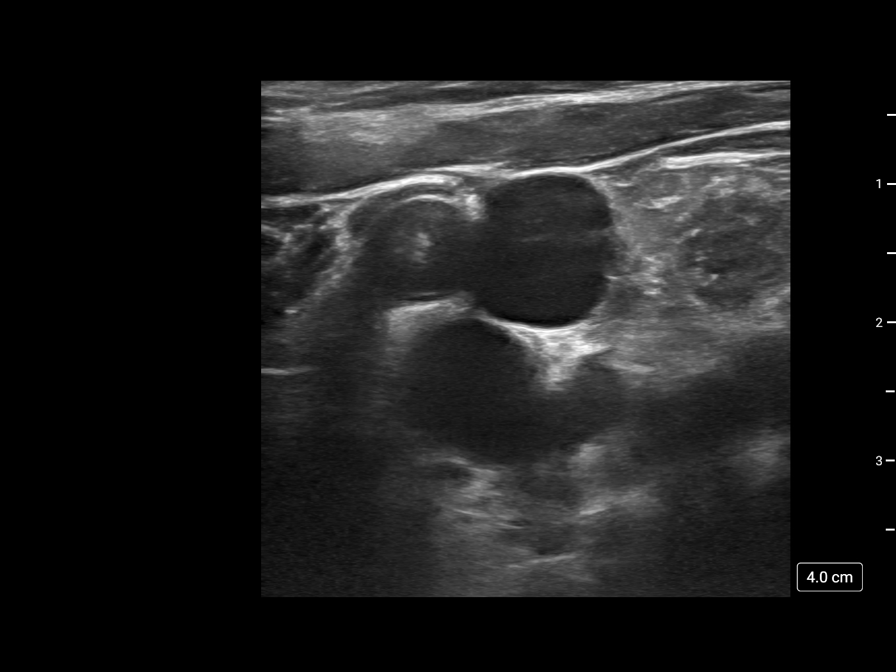

[Series 1: care single · 2 of 2 slices shown]
[im 1/2]
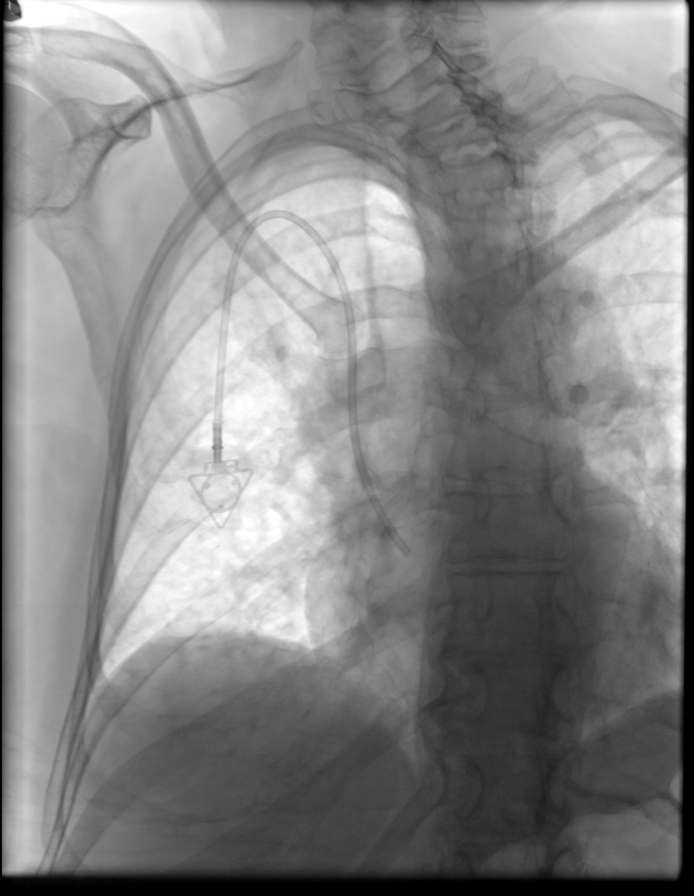
[im 2/2]
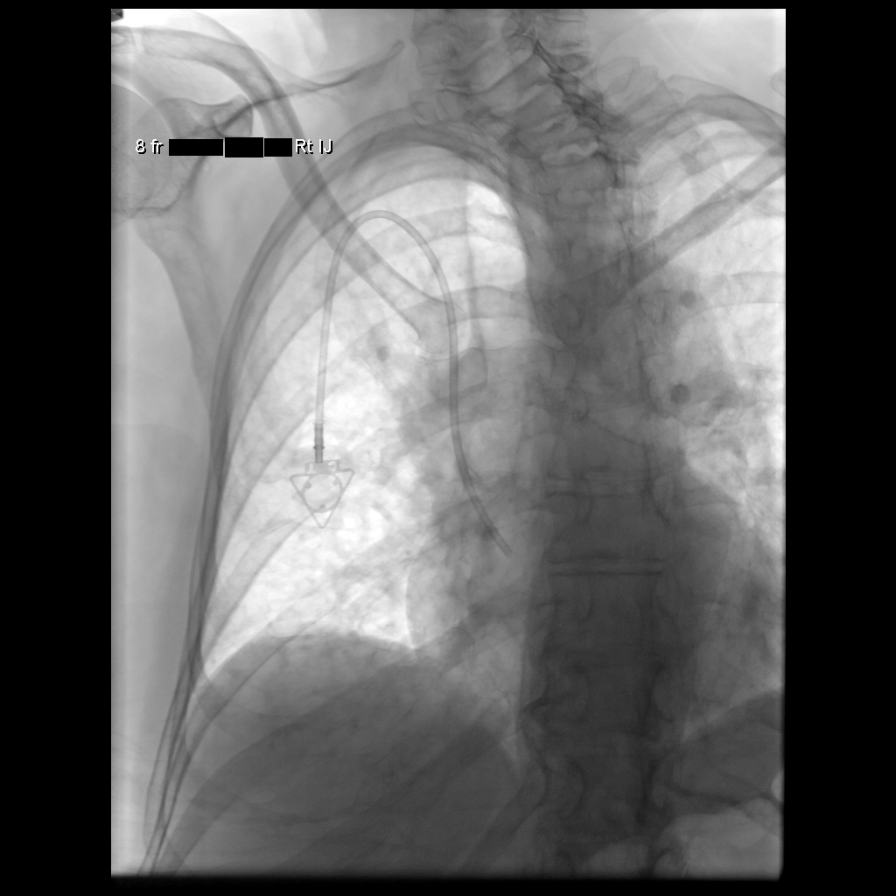

[3 of 3 positions shown; findings below may reference images not displayed]

MEDICATIONS:
None.

ANESTHESIA/SEDATION:
Moderate (conscious) sedation was employed during this procedure. A
total of Versed 2 mg and Fentanyl 100 mcg was administered
intravenously.

Moderate Sedation Time: 15 minutes. The patient's level of
consciousness and vital signs were monitored continuously by
radiology nursing throughout the procedure under my direct
supervision.

CONTRAST:  None

FLUOROSCOPY TIME:  0 minutes, 6 seconds (1 mGy)

COMPLICATIONS:
None immediate.

PROCEDURE:
The procedure, risks, benefits, and alternatives were explained to
the patient. Questions regarding the procedure were encouraged and
answered. The patient understands and consents to the procedure.

The right neck and chest were prepped with chlorhexidine in a
sterile fashion, and a sterile drape was applied covering the
operative field. Maximum barrier sterile technique with sterile
gowns and gloves were used for the procedure. A timeout was
performed prior to the initiation of the procedure.

Ultrasound was used to examine the jugular vein which was
compressible and free of internal echoes. A skin marker was used to
demarcate the planned venotomy and port pocket incision sites. Local
anesthesia was provided to these sites and the subcutaneous tunnel
track with 1% lidocaine with [DATE] epinephrine.

A small incision was created at the jugular access site and blunt
dissection was performed of the subcutaneous tissues. Under
ultrasound guidance, the jugular vein was accessed with a 21 ga
micropuncture needle and an 0.018" wire was inserted to the superior
vena cava. Real-time ultrasound guidance was utilized for vascular
access including the acquisition of a permanent ultrasound image
documenting patency of the accessed vessel. A 5 Fr micopuncture set
was then used, through which a 0.035" Rosen wire was passed under
fluoroscopic guidance into the inferior vena cava. An 8 Fr dilator
was then placed over the wire.

A subcutaneous port pocket was then created along the upper chest
wall utilizing a combination of sharp and blunt dissection. The
pocket was irrigated with sterile saline, packed with gauze, and
observed for hemorrhage. A single lumen "ISP" sized power injectable
port was chosen for placement. The 8 Fr catheter was tunneled from
the port pocket site to the venotomy incision. The port was placed
in the pocket. The external catheter was trimmed to appropriate
length. The dilator was exchanged for an 8 Fr peel-away sheath under
fluoroscopic guidance. The catheter was then placed through the
sheath and the sheath was removed. Final catheter positioning was
confirmed and documented with a fluoroscopic spot radiograph. The
port was accessed with GUNTHER needle, aspirated, and flushed with
heparinized saline.

The deep dermal layer of the port pocket incision was closed with
interrupted 3-0 Vicryl suture. Dermabond was then placed over the
port pocket and neck incisions. The patient tolerated the procedure
well without immediate post procedural complication.
FINDINGS: After catheter placement, the tip lies within the superior
cavoatrial junction. The catheter aspirates and flushes normally and
is ready for immediate use.
IMPRESSION: Successful placement of a power injectable Port-A-Cath via the right
internal jugular vein. The catheter is ready for immediate use.

## 2020-09-01 MED ORDER — SODIUM CHLORIDE 0.9 % IV SOLN: INTRAVENOUS | Status: DC

## 2020-09-01 MED ORDER — HEPARIN SOD (PORK) LOCK FLUSH 100 UNIT/ML IV SOLN
INTRAVENOUS | Status: AC | PRN
  Administered 2020-09-01: 500 [IU] via INTRAVENOUS

## 2020-09-01 MED ORDER — LORAZEPAM 0.5 MG PO TABS
0.5000 mg | ORAL_TABLET | Freq: Every evening | ORAL | Status: AC | PRN
  Administered 2020-09-01: 0.5 mg via ORAL
  Filled 2020-09-01: qty 1

## 2020-09-01 MED ORDER — CHLORHEXIDINE GLUCONATE CLOTH 2 % EX PADS
6.0000 | MEDICATED_PAD | Freq: Every day | CUTANEOUS | Status: DC
  Administered 2020-09-01 – 2020-09-04 (×4): 6 via TOPICAL

## 2020-09-01 MED ORDER — FENTANYL CITRATE (PF) 100 MCG/2ML IJ SOLN
INTRAMUSCULAR | Status: AC
  Filled 2020-09-01: qty 2

## 2020-09-01 MED ORDER — MAGNESIUM SULFATE 2 GM/50ML IV SOLN
2.0000 g | Freq: Once | INTRAVENOUS | Status: AC
  Administered 2020-09-01: 2 g via INTRAVENOUS
  Filled 2020-09-01: qty 50

## 2020-09-01 MED ORDER — FENTANYL CITRATE (PF) 100 MCG/2ML IJ SOLN
INTRAMUSCULAR | Status: AC | PRN
  Administered 2020-09-01 (×2): 50 ug via INTRAVENOUS

## 2020-09-01 MED ORDER — KATE FARMS STANDARD 1.4 PO LIQD
325.0000 mL | Freq: Two times a day (BID) | ORAL | Status: DC
  Administered 2020-09-01 – 2020-09-04 (×7): 325 mL via ORAL
  Filled 2020-09-01 (×7): qty 325

## 2020-09-01 MED ORDER — MIDAZOLAM HCL 2 MG/2ML IJ SOLN
INTRAMUSCULAR | Status: AC | PRN
  Administered 2020-09-01 (×2): 1 mg via INTRAVENOUS

## 2020-09-01 MED ORDER — LIDOCAINE-EPINEPHRINE 1 %-1:100000 IJ SOLN
INTRAMUSCULAR | Status: AC | PRN
  Administered 2020-09-01 (×2): 10 mL via INTRADERMAL

## 2020-09-01 MED ORDER — HEPARIN SOD (PORK) LOCK FLUSH 100 UNIT/ML IV SOLN
INTRAVENOUS | Status: AC
  Filled 2020-09-01: qty 5

## 2020-09-01 MED ORDER — MIDAZOLAM HCL 2 MG/2ML IJ SOLN
INTRAMUSCULAR | Status: AC
  Filled 2020-09-01: qty 2

## 2020-09-01 MED ORDER — DEXAMETHASONE 4 MG PO TABS
8.0000 mg | ORAL_TABLET | Freq: Once | ORAL | Status: AC
  Administered 2020-09-03: 8 mg via ORAL
  Filled 2020-09-01: qty 2

## 2020-09-01 MED ORDER — LIVING WELL WITH DIABETES BOOK
Freq: Once | Status: AC
  Filled 2020-09-01: qty 1

## 2020-09-01 MED ORDER — LORAZEPAM 0.5 MG PO TABS
0.5000 mg | ORAL_TABLET | Freq: Four times a day (QID) | ORAL | Status: AC | PRN
  Administered 2020-09-01 (×2): 0.5 mg via ORAL
  Filled 2020-09-01 (×2): qty 1

## 2020-09-01 MED ORDER — DEXAMETHASONE 4 MG PO TABS
8.0000 mg | ORAL_TABLET | Freq: Once | ORAL | Status: AC
  Administered 2020-09-04: 8 mg via ORAL
  Filled 2020-09-01: qty 2

## 2020-09-01 MED ORDER — LIDOCAINE-EPINEPHRINE 1 %-1:100000 IJ SOLN
INTRAMUSCULAR | Status: AC
  Filled 2020-09-01: qty 1

## 2020-09-01 NOTE — Progress Notes (Addendum)
Inpatient Diabetes Program Recommendations  AACE/ADA: New Consensus Statement on Inpatient Glycemic Control (2015)  Target Ranges:  Prepandial:   less than 140 mg/dL      Peak postprandial:   less than 180 mg/dL (1-2 hours)      Critically ill patients:  140 - 180 mg/dL   Results for ANYSHA, FRAPPIER (MRN [id number]) as of 09/01/2020 07:49  Ref. Range 08/30/2020 00:13 08/30/2020 06:16 08/30/2020 12:41 08/30/2020 18:46 08/30/2020 23:37  Glucose-Capillary Latest Ref Range: 70 - 99 mg/dL 133 (H) 138 (H) 114 (H) 106 (H) 111 (H)   Results for CHEALSEY, MIYAMOTO (MRN [id number]) as of 09/01/2020 07:49  Ref. Range 08/31/2020 06:34 08/31/2020 14:20 08/31/2020 18:45 08/31/2020 23:54 09/01/2020 05:51  Glucose-Capillary Latest Ref Range: 70 - 99 mg/dL 74 64 (L) 124 (H) 109 (H) 102 (H)   Results for MARQUISA, SALIH (MRN [id number]) as of 09/01/2020 07:49  Ref. Range 08/29/2020 05:38  Hemoglobin A1C Latest Ref Range: 4.8 - 5.6 % 7.0 (H)  (154 mg/dl)     Admit with: Pelvic mass with multifocal adenopathy/ Mild left hydroureteronephrosis/ Partial obstruction of sigmoid colon  New Diagnosis Diabetes this admission per MD notes  History: Pre-diabetes  Current Orders: Checking CBGs Q6 hours   New Diagnosis Diabetes this admission  Will order educational materials and ask RD to see pt for DM diet education  MD- Please make sure pt given Rx for Glucometer at time of discharge home Order Number: [id number]    Addendum 11:45am--Met with pt at bedside this AM to discuss current A1c of 7% and new diagnosis of Diabetes.  We talked about how she has many stressors on her body right now which may have led to her sugars become elevated above normal levels.  Pt not eating much and has lost a lot of weight per pt report.  Mentioned to me that her PCP has mentioned Pre-diabetes but that they did not tell her she needed to worry about her sugar levels at this point.  Spoke with pt about new diagnosis.  Discussed A1C results  with her and explained what an A1C is, basic pathophysiology of DM Type 2, basic home care, basic diabetes diet nutrition principles, importance of checking CBGs and maintaining good CBG control to prevent long-term and short-term complications.  Reviewed signs and symptoms of hyperglycemia and hypoglycemia and how to treat hypoglycemia and hyperglycemia at home.  Also reviewed blood sugar goals and A1c goals for home.  Discussed with pt that she will likely need to check her CBGs at home after discharge.  We also talked about the possibility that she could be given Steroids when she starts her Chemotherapy.  We talked about the different steroids she could get and I reviewed with pt that steroids can cause hyperglycemia.  Encouraged pt to be aware of symptoms of Hyperglycemia (see above--we reviewed) and to check her CBGs more frequently at home (Q4 hours) if having CBGs >250 at home.  Encouraged hydration when CBGs are high and to let her Oncologist know if she is having sustained hyperglycemia at home after she starts her Chemo because she may need to come in to see the MD or go to the hospital for hyperglycemia treatment.  Pt aware and thanked me for all the info shared.  RNs to provide ongoing basic DM education at bedside with this patient.  Have ordered educational booklet and CBG meter teaching.  Have also placed RD consult for DM diet education for this patient.      --  Will follow patient during hospitalization--  Wyn Quaker RN, MSN, CDE Diabetes Coordinator Inpatient Glycemic Control Team Team Pager: [phone] (8a-5p)

## 2020-09-01 NOTE — Progress Notes (Signed)
PROGRESS NOTE  Teresa Oliver  DOB: [birth year]  PCP: Jani Gravel, MD RKY:[id number]  DOA: 08/28/2020  LOS: 4 days   Chief complaint: Abnormal blood work  Brief narrative: Teresa Oliver is a 75 y.o. female with PMH significant for HTN, HLD, IBS who was found to have electrolyte abnormality in outpatient lab work done by GI and hence sent to ED.  Patient also complained of constipation for 3 to 4 days, poor oral intake for last several weeks and 8 pounds of weight loss in 2 months. In the ED, CT abdomen/pelvis which showed a pelvic mass measuring 7.5 x 4.8 cm, contiguous with the sigmoid colon and uterus. There was also partial obstruction of the left ureter with mild left hydroureteronephrosis.  Multifocal adenopathy in the abdomen and pelvis suspicious for metastatic disease. Also moderate to large amount of stool in proximal colon " suggesting pelvic mass may be causing delayed colonic transit." She was directly admitted to hospital service at [facility]. Consultation obtained from gynecological oncology, urology, GI, IR.  4/13, underwent biopsy of the pelvic mass by IR 4/14, underwent colonoscopy and biopsy of the sigmoid colon See below for more details. 09/01/2020: Patient seen alongside patient's husband and son.  Patient seems to be bit anxious.  Patient reported having been given a lot of information, including but not limited to planned chemotherapy.  Tachycardia is reported.  We will proceed with EKG, telemetry monitoring, IV fluids, repletion of magnesium, and brief low-dose as needed lorazepam.  Will work-up hyponatremia weight urine sodium, urine osmolality, serum osmolality, cortisol and TSH.  AM labs.    Subjective: -Seems worried regarding several information given. -No chest pain. -No shortness of breath.    Assessment/Plan: Pelvic mass with multifocal adenopathy Mild left hydroureteronephrosis Partial obstruction of sigmoid colon -Presented with constipation, poor  oral intake, abnormal electrolytes -CT abdomen pelvis showed pelvic mass with multifocal adenopathy with secondary compression to the left ureter as well as bowel. -GYN oncology, urology and GI consultation appreciated.   -4/13, patient underwent biopsy of the mass by IR.   -4/14, patient underwent colonoscopy and biopsy of the sigmoid colon. -Patient seems to have metastatic disease of unknown primary causing partial obstruction of the left ureter and sigmoid colon.  Per GYN oncology note from today, patient would need a left nephrostomy tube as well as colonic diversion. -According to her note, IR will do left nephrostomy tube tomorrow and general surgery may do a diverting ostomy next week.  Constipation History of IBS -Delayed colonic transit due to compression from pelvic mass -Senokot scheduled.  MiraLAX as needed.  Elevated creatinine -No baseline available from past. -Presented with creatinine of 1.22. Roughly stable. Recent Labs    08/28/20 1648 08/28/20 2138 08/29/20 0538 08/29/20 1307 08/30/20 0653 08/31/20 0533 09/01/20 0519  BUN 18 17 16 15 14 14 10   CREATININE 1.22* 1.24* 1.28* 1.20* 1.15* 1.16* 1.17*   Hyponatremia -Likely hypovolemic due to poor oral intake. But also suspected SIADH because of coexisting malignancy. -Sodium level improving today.  Not on IV fluid. 09/01/2020: Check urine sodium, urine osmolality, serum osmolality.  We will also check TSH and cortisol level.  We will start patient on IV fluids. Recent Labs  Lab 08/28/20 1648 08/28/20 2138 08/29/20 0538 08/29/20 1307 08/30/20 0653 08/31/20 0533 09/01/20 0519  NA 117* 120* 119* 120* 118* 125* 125*   Hypokalemia/hypomagnesemia/hypophosphatemia -Low levels of potassium, magnesium and phosphorus due to poor oral intake -Aggressively replaced with improvement. Recent Labs  Lab  08/28/20 1731 08/28/20 2138 08/29/20 0538 08/29/20 1307 08/30/20 0653 08/31/20 0533 09/01/20 0519  K  --    < >  4.0 4.2 4.0 3.7 3.9  MG 1.5*  --  2.2  --  1.7 1.7 1.5*  PHOS  --   --  1.8*  --  3.1 2.8 2.6   < > = values in this interval not displayed.   Acute metabolic encephalopathy -Patient cheerful but remains confused, unable to recollect recent details. -Multifactorial: Poor oral intake, abnormal electrolytes, malignancy -Continue monitor mental status change  Hypertension History of essential hypertension-On losartan and hydrochlorothiazide at home.  Currently on hold because of low blood pressure -Continue to monitor blood pressure.  Diabetes mellitus type II -A1c 7 on 4/12  -Not on antidiabetic regimen.  Poor oral intake.  Would try dietary measures and target A1c less than 8. -Sliding scale insulin with Accu-Cheks Recent Labs  Lab 08/31/20 1420 08/31/20 1845 08/31/20 2354 09/01/20 0551 09/01/20 1209  GLUCAP 64* 124* 109* 102* 115*   Hyperlipidemia -On pravastatin.  Mobility: Encourage ambulation. Code Status:   Code Status: Full Code  Nutritional status: Body mass index is 22.01 kg/m. Nutrition Problem: Moderate Malnutrition Etiology: chronic illness Signs/Symptoms: percent weight loss,energy intake < or equal to 75% for > or equal to 1 month,mild fat depletion,mild muscle depletion Diet Order            DIET SOFT Room service appropriate? Yes; Fluid consistency: Thin  Diet effective now                 DVT prophylaxis: heparin injection 5,000 Units Start: 08/30/20 2200   Antimicrobials:  None Fluid: Not on IV fluid Consultants: Urology, gynecology, GI, IR, general surgery Family Communication:  None at bedside  Status is: Inpatient  Remains inpatient appropriate because:  pending nephrostomy tube placement, may need colostomy as well  Dispo: The patient is from: Home              Anticipated d/c is to: Home likely              Patient currently is not medically stable to d/c.   Difficult to place patient No       Infusions:    Scheduled Meds: .  Chlorhexidine Gluconate Cloth  6 each Topical Daily  . [START ON 09/03/2020] dexamethasone  8 mg Oral Once  . [START ON 09/04/2020] dexamethasone  8 mg Oral Once  . feeding supplement (KATE FARMS STANDARD 1.4)  325 mL Oral BID BM  . heparin  5,000 Units Subcutaneous Q8H  . melatonin  3 mg Oral QHS  . senna-docusate  1 tablet Oral BID  . sodium chloride flush  3 mL Intravenous Q12H    Antimicrobials: Anti-infectives (From admission, onward)   None      PRN meds: acetaminophen **OR** acetaminophen, ondansetron **OR** ondansetron (ZOFRAN) IV, oxyCODONE, polyethylene glycol   Objective: Vitals:   09/01/20 1443 09/01/20 1518  BP: 130/88 120/78  Pulse: (!) 120 (!) 119  Resp: 16 16  Temp: 98 F (36.7 C)   SpO2: 100% 99%    Intake/Output Summary (Last 24 hours) at 09/01/2020 1544 Last data filed at 08/31/2020 1918 Gross per 24 hour  Intake 240 ml  Output --  Net 240 ml   Filed Weights   08/28/20 1629  Weight: 51.1 kg   Weight change:  Body mass index is 22.01 kg/m.   Physical Exam: General exam: Pleasant elderly Caucasian female.  Patient is  mildly anxious but not in any significant distress.Marland Kitchen HEENT: Atraumatic, normocephalic, no obvious bleeding.  Dry buccal mucosa. Lungs: Clear to auscultation bilaterally CVS: S1-S2, tachycardic.   GI/Abd soft, nontender, nondistended, bowel sound present CNS: Alert, awake, oriented x3.  Patient moves all extremities. Extremities: No pedal edema, no calf tenderness  Data Review: I have personally reviewed the laboratory data and studies available.  Recent Labs  Lab 08/28/20 1648 08/29/20 0538 08/30/20 0653 08/31/20 0533 09/01/20 0519  WBC 15.6* 13.6* 13.0* 16.5* 13.4*  NEUTROABS 12.7* 11.4* 10.7* 12.9* 10.8*  HGB 8.7* 8.8* 9.4* 9.0* 8.1*  HCT 26.1* 27.2* 28.9* 28.2* 26.2*  MCV 63.7* 65.5* 65.8* 66.7* 67.4*  PLT 435* 425* 445* 465* 407*   Recent Labs  Lab 08/28/20 1731 08/28/20 2138 08/29/20 0538 08/29/20 1307  08/30/20 0653 08/31/20 0533 09/01/20 0519  NA  --    < > 119* 120* 118* 125* 125*  K  --    < > 4.0 4.2 4.0 3.7 3.9  CL  --    < > 86* 86* 86* 93* 96*  CO2  --    < > 23 21* 21* 20* 19*  GLUCOSE  --    < > 125* 136* 122* 96 99  BUN  --    < > 16 15 14 14 10   CREATININE  --    < > 1.28* 1.20* 1.15* 1.16* 1.17*  CALCIUM  --    < > 8.6* 8.8* 8.5* 8.7* 8.5*  MG 1.5*  --  2.2  --  1.7 1.7 1.5*  PHOS  --   --  1.8*  --  3.1 2.8 2.6   < > = values in this interval not displayed.    F/u labs ordered. Unresulted Labs (From admission, onward)          Start     Ordered   09/04/20 0000  Prealbumin  Weekly,   R      08/30/20 1430   08/30/20 0500  CBC with Differential/Platelet  Tomorrow morning,   R        08/29/20 1318   08/29/20 3557  Basic metabolic panel  Daily,   R      08/28/20 2254   08/29/20 0500  CBC with Differential/Platelet  Daily,   R      08/28/20 2254   08/29/20 0500  Magnesium  Daily,   R      08/28/20 2254   08/29/20 0500  Phosphorus  Daily,   R      08/28/20 2254          Signed, Bonnell Public, MD [facility] 09/01/2020

## 2020-09-01 NOTE — Progress Notes (Signed)
START ON PATHWAY REGIMEN - Uterine     A cycle is every 21 days:     Paclitaxel      Carboplatin   **Always confirm dose/schedule in your pharmacy ordering system**  Patient Characteristics: Serous Carcinoma, Newly Diagnosed (Clinical Staging), Nonsurgical Candidate, Stage III or IV, HER2 Negative/Unknown Histology: Serous Carcinoma Therapeutic Status: Newly Diagnosed (Clinical Staging) AJCC M Category: cM0 Surgical Candidacy: Nonsurgical Candidate AJCC 8 Stage Grouping: IVA AJCC T Category: cT4 AJCC N Category: cN1 HER2 Status: Awaiting Test Results  Intent of Therapy: Curative Intent, Discussed with Patient 

## 2020-09-01 NOTE — Progress Notes (Signed)
Nutrition Follow-up  DOCUMENTATION CODES:   Non-severe (moderate) malnutrition in context of chronic illness  INTERVENTION:   - [employer] 1.4 po BID, each supplement provides 455 kcal and 20 grams protein.   -Magic cup TID with meals, each supplement provides 290 kcal and 9 grams of protein  NUTRITION DIAGNOSIS:   Moderate Malnutrition related to chronic illness as evidenced by percent weight loss,energy intake < or equal to 75% for > or equal to 1 month,mild fat depletion,mild muscle depletion.  GOAL:   Patient will meet greater than or equal to 90% of their needs  MONITOR:   PO intake,Supplement acceptance,Diet advancement,Labs,Weight trends,I & O's  REASON FOR ASSESSMENT:   Consult Diet education  ASSESSMENT:   75 yo female with PMH HTN, HLD, IBS who initially presented to [facility] from referral per GI after being found to have electrolyte derangements on outpatient lab work-up.   She also had been complaining of decreased urinary stream, constipation, decreased appetite/weight loss.  She underwent CT abdomen/pelvis which showed a pelvic mass measuring 7.5 x 4.8 cm, contiguous with the sigmoid colon and uterus.  There was also partial obstruction of the left ureter with mild left hydroureteronephrosis.  4/13: s/p CT guided biopsy 4/14: s/p colonoscopy  4/15: single lumen power port placement  RD consulted for diet education regarding new diabetes diagnosis.  Pt's diet now advanced today to soft after being  NPO/CLD since 4/11. Pt's main concern is to improve intake then can focus on diet instruction. Pt with moderate malnutrition. Have added [employer] [place] and Magic cups to meals to help provide kcals and protein, which the patient has been lacking for the last few months.  Noted that pt expected to have steroids and chemotherapy treatments.  Admission weight: 112 lbs.  Medications: Senokot  Labs reviewed CBGs: 102-115 Low Na, Mg  Diet Order:   Diet  Order            DIET SOFT Room service appropriate? Yes; Fluid consistency: Thin  Diet effective now                 EDUCATION NEEDS:   Education needs have been addressed  Skin:  Skin Assessment: Reviewed RN Assessment  Last BM:  4/9  Height:   Ht Readings from Last 1 Encounters:  08/28/20 5' (1.524 m)    Weight:   Wt Readings from Last 1 Encounters:  08/28/20 51.1 kg   BMI:  Body mass index is 22.01 kg/m.  Estimated Nutritional Needs:   Kcal:  1500-1700  Protein:  65-80g  Fluid:  1.7L/day   Clayton Bibles, MS, RD, LDN Inpatient Clinical Dietitian Contact information available via Amion

## 2020-09-01 NOTE — Consult Note (Addendum)
[place]  [phone] [phone]   I have seen the patient and agree with documentation as follows I also reviewed plan of care, and discussed chemotherapy and side effects with the patient, her husband and her son today  [place]  Referral MD: Dr. Jeral Pinch  Reason for Referral: Pelvic mass, poorly differentiated carcinoma with necrosis  HPI: Ms. Teresa Oliver is a 75 year old female with a past medical history significant for thalassemia (has not required transfusions a likely thalassemia minor - [place] ancestry), type 2 diabetes mellitus, hypertension, hyperlipidemia, irritable bowel syndrome who initially presented to [place] upon the advice of her GI doctor after being found to have electrolyte abnormalities on outpatient labs.  The patient was also having decreased urinary stream, constipation, anorexia, weight loss.  On admission, her WBC is 15.6, hemoglobin 8.7, MCV 63.7, platelets 435,000, sodium 117, potassium 2.6, creatinine 1.22.  Her ferritin was 771, iron 31, TIBC 218, percent saturation 14% folate 7.2 and vitamin B12 was 978. She had a CT of the abdomen pelvis without contrast which showed a pelvic mass measuring 7.5 x 4.8 cm contiguous with the sigmoid colon and uterus, pelvic mass causes partial obstruction of the left ureter with mild left hydroureteronephrosis, wall thickening of the sigmoid colon distal to the left pelvic mass with pericolonic edema suspicious for colitis.  The patient had a CT-guided biopsy performed on 08/30/2020 which showed a poorly differentiated carcinoma with necrosis.  She had a colonoscopy performed on 08/31/2020 which showed a large mass causing extrinsic compression and partial obstruction of the sigmoid colon.  Mass was thought not to be a primary colon cancer but instead metastatic from pelvic malignancy.  She had tumor markers obtained on 08/29/2020 and her Ca1 25 was 966, CEA  less than 0.3, CA 19.9 was 7.  The patient had a Port-A-Cath placed earlier today.  The patient just returned from IR for Port-A-Cath placement at the time my visit.  She is still a bit sedated.  The patient tells me that she has had a poor appetite x1 month and a weight loss of about 9 to 10 pounds during that same timeframe.  She had reflux symptoms for 1 week which have resolved.  She was being seen for constipation as an outpatient and placed on laxative which were not fully effective.  She is also been having some rectal drainage which she reports is clear but did see a few drops of blood on 1 occasion.  She not been having any nausea, vomiting.  She denies headaches, dizziness, chest pain, shortness of breath.  Denies any other bleeding except for the few drops of blood she saw from her rectal drainage.  The patient is married.  She has 2 children.  Her son also lives with her.  Denies history of alcohol tobacco use.  Family history significant for a mother with gastric cancer, dementia, thalassemia, brother with prostate cancer, and a cousin cancer which sounds like GYN in origin.  Medical oncology was asked to see the patient made recommendations regarding her pelvic mass and poorly differentiated carcinoma.   Past Medical History:  Diagnosis Date   Anemia    chronic   Hyperlipemia    Hypertension    Irritable bowel syndrome (IBS)    :  Past Surgical History:  Procedure Laterality Date   BREAST EXCISIONAL BIOPSY Left    1985 benign   HERNIA REPAIR     :  Current Facility-Administered Medications  Medication Dose Route Frequency Provider Last Rate Last Admin   acetaminophen (TYLENOL) tablet 650 mg  650 mg Oral Q6H PRN Dwyane Dee, MD       Or   acetaminophen (TYLENOL) suppository 650 mg  650 mg Rectal Q6H PRN Dwyane Dee, MD       heparin injection 5,000 Units  5,000 Units Subcutaneous Q8H Allred, Darrell K, PA-C   5,000 Units at 08/31/20 2117   melatonin tablet 3 mg  3 mg  Oral QHS Dahal, Marlowe Aschoff, MD   3 mg at 08/31/20 2117   ondansetron (ZOFRAN) tablet 4 mg  4 mg Oral Q6H PRN Dwyane Dee, MD       Or   ondansetron [facility]) injection 4 mg  4 mg Intravenous Q6H PRN Dwyane Dee, MD       oxyCODONE (Oxy IR/ROXICODONE) immediate release tablet 5 mg  5 mg Oral Q4H PRN Dwyane Dee, MD   5 mg at 09/01/20 0017   polyethylene glycol (MIRALAX / GLYCOLAX) packet 17 g  17 g Oral BID PRN Salley Slaughter, PA-C       senna-docusate (Senokot-S) tablet 1 tablet  1 tablet Oral BID Sheikh, Omair Latif, DO   1 tablet at 08/30/20 1109   sodium chloride flush (NS) 0.9 % injection 3 mL  3 mL Intravenous Q12H Dwyane Dee, MD   3 mL at 09/01/20 0957     No Known Allergies:  Family History  Problem Relation Age of Onset   Stomach cancer Mother    Dementia Mother    Heart attack Father    Prostate cancer Brother    :  Social History   Socioeconomic History   Marital status: Married    Spouse name: Not on file   Number of children: Not on file   Years of education: Not on file   Highest education level: Not on file  Occupational History   Not on file  Tobacco Use   Smoking status: Never Smoker   Smokeless tobacco: Never Used  Substance and Sexual Activity   Alcohol use: Not Currently   Drug use: Not Currently   Sexual activity: Not on file  Other Topics Concern   Not on file  Social History Narrative   Not on file   Social Determinants of Health   Financial Resource Strain: Not on file  Food Insecurity: Not on file  Transportation Needs: Not on file  Physical Activity: Not on file  Stress: Not on file  Social Connections: Not on file  Intimate Partner Violence: Not on file  :  Review of Systems: A comprehensive 14 point review of systems was negative except as noted in the HPI.  Exam: Patient Vitals for the past 24 hrs:  BP Temp Temp src Pulse Resp SpO2  09/01/20 1105 115/71 -- -- 84 10 100 %  09/01/20 1100 115/78 -- -- 83 12 100 %   09/01/20 1055 112/76 -- -- 83 14 100 %  09/01/20 1050 112/79 -- -- 88 20 100 %  09/01/20 1045 127/90 -- -- 100 13 99 %  09/01/20 1040 (!) 143/113 -- -- 94 16 100 %  09/01/20 1040 -- -- -- (!) 104 17 100 %  09/01/20 0548 108/73 98.1 F (36.7 C) Oral (!) 105 18 100 %  08/31/20 2353 -- -- -- (!) 108 -- 100 %  08/31/20 2142 116/85 98.3 F (36.8 C) Oral (!) 51 14 (!) 86 %  08/31/20 1330 113/66 -- -- 96 19 100 %  08/31/20 1328 107/63 -- -- 94 20 100 %  08/31/20 1325 (!) 95/54 -- -- 98 20 100 %  08/31/20 1320 106/73 -- -- 81 (!) 23 96 %  08/31/20 1315 (!) 87/50 -- -- 80 13 100 %  08/31/20 1310 (!) 78/53 98.6 F (37 C) Axillary 83 11 100 %    General:  well-nourished in no acute distress.   Eyes:  no scleral icterus.   ENT:  There were no oropharyngeal lesions.     Lymphatics:  Negative cervical, supraclavicular or axillary adenopathy.   Respiratory: lungs were clear bilaterally without wheezing or crackles.   Cardiovascular:  Regular rate and rhythm, S1/S2, without murmur, rub or gallop.  There was no pedal edema.   GI: Positive bowel sounds,, mildly distended, nontender Musculoskeletal: Strength symmetrical in the upper and lower extremities. Skin exam was without echymosis, petichae.   Neuro exam was nonfocal. Patient was alert and oriented.  Attention was good.   Language was appropriate.  Mood was normal without depression.  Speech was not pressured.  Thought content was not tangential.     Lab Results  Component Value Date   WBC 13.4 (H) 09/01/2020   HGB 8.1 (L) 09/01/2020   HCT 26.2 (L) 09/01/2020   PLT 407 (H) 09/01/2020   GLUCOSE 99 09/01/2020   ALT 18 08/30/2020   AST 24 08/30/2020   NA 125 (L) 09/01/2020   K 3.9 09/01/2020   CL 96 (L) 09/01/2020   CREATININE 1.17 (H) 09/01/2020   BUN 10 09/01/2020   CO2 19 (L) 09/01/2020   I have personally reviewed her imaging studies CT ABDOMEN PELVIS WO CONTRAST  Result Date: 08/28/2020 CLINICAL DATA:  Abdominal pain, acute,  nonlocalized EXAM: CT ABDOMEN AND PELVIS WITHOUT CONTRAST TECHNIQUE: Multidetector CT imaging of the abdomen and pelvis was performed following the standard protocol without IV contrast. COMPARISON:  Radiograph 08/25/2020 FINDINGS: Lower chest: Lung bases are clear. No focal consolidation or pleural fluid. Small hiatal hernia. Hepatobiliary: No focal liver abnormality is seen. No gallstones, gallbladder wall thickening, or biliary dilatation. Pancreas: No ductal dilatation or inflammation. Spleen: Normal in size without focal abnormality. Adrenals/Urinary Tract: No adrenal nodule. Minimal left hydronephrosis. Left ureter is dilated to the pelvis. No renal or ureteral stone. No significant perinephric edema. No right hydronephrosis. No right renal stone. Decompressed right ureter. Urinary bladder is essentially empty and not well assessed. Stomach/Bowel: Bowel evaluation is limited in the absence of enteric contrast. There is wall thickening of the sigmoid colon with mild pericolonic edema. The mid sigmoid colon appears apposed to the uterus with obliteration of normal fat plane. Sigmoid colon is also difficult to delineate from a pelvic mass, which is also contiguous with the uterus. Please note that detailed assessment is limited on this noncontrast exam. There is a moderate to large volume of stool in the more proximal colon. No definite small bowel obstruction or inflammatory change. Appendix is normal. Small to moderate hiatal hernia. Vascular/Lymphatic: Multifocal adenopathy. Please note that detailed assessment of adenopathy is limited on this noncontrast exam. For instance, left periaortic node measures 16 mm short axis, series 2, image 30. Additional prominent and mildly enlarged lower retroperitoneal lymph nodes are seen. Right upper quadrant adenopathy with lymph nodes measuring 11 in 15 mm, series 2, image 36, likely in the mesentery. Right lower quadrant mesenteric node measures 13 mm, series 2, image  44. There additional enlarged ileocolic lymph nodes. 9 mm right external iliac node. Rounded soft tissue mass about the  left pelvic sidewall measures 2.3 x 3.3 cm, may be adnexal mass or iliac adenopathy. There is a 17 mm left external iliac node, series 2, image 58. Prominent bilateral inguinal nodes. Reproductive: Pelvic mass just to the left of midline measures 7.5 x 4.8 cm, and is contiguous with the sigmoid colon and uterus. The ovary is not definitively visualized. Right ovary is not definitively seen. Other: Mild fat stranding in the pelvis as well as presacral region. Minimal omental edema without obvious omental thickening or mass. No ascites. No free air. Musculoskeletal: No acute osseous abnormality or focal bone lesion. No subcutaneous abnormality. IMPRESSION: 1. Pelvic mass measuring 7.5 x 4.8 cm, contiguous with the sigmoid colon and uterus with loss of fat planes. Site of origin is unclear, may be ovarian, uterine, or colonic. Lack of contrast limits detailed assessment. 2. Above pelvic mass causes partial obstruction of the left ureter with mild left hydroureteronephrosis. 3. Multifocal adenopathy in the abdomen and pelvis, suspicious for metastatic disease, primarily in the mesentery and lower peritoneum and pelvis. Please note that detailed assessment of adenopathy is limited on this noncontrast exam. Recommend oncology referral. 4. Wall thickening of the sigmoid colon distal to the left pelvic mass with pericolonic edema, suspicious for colitis. There is a moderate to large amount of stool in the more proximal colon, suggesting pelvic mass may be causing delayed colonic transit. 5. Small to moderate hiatal hernia. Electronically Signed   By: Keith Rake M.D.   On: 08/28/2020 18:30   CT BIOPSY  Result Date: 08/30/2020 INDICATION: 75 year old woman with uterine mass and pelvic lymphadenopathy presents to interventional radiology for biopsy of left external iliac chain lymph node. EXAM:  CT-guided biopsy of left external iliac chain lymph node. MEDICATIONS: None. ANESTHESIA/SEDATION: Moderate (conscious) sedation was employed during this procedure. A total of Versed 2 mg and Fentanyl 50 mcg was administered intravenously. Moderate Sedation Time: 10 minutes. The patient's level of consciousness and vital signs were monitored continuously by radiology nursing throughout the procedure under my direct supervision. COMPLICATIONS: None immediate. PROCEDURE: Informed written consent was obtained from the patient after a thorough discussion of the procedural risks, benefits and alternatives. All questions were addressed. Maximal Sterile Barrier Technique was utilized including caps, mask, sterile gowns, sterile gloves, sterile drape, hand hygiene and skin antiseptic. A timeout was performed prior to the initiation of the procedure. Patient position supine on the CT table. Left inguinal skin prepped and draped in usual sterile fashion. Following local lidocaine administration, 17 gauge introducer needle was advanced into the enlarged left external iliac chain lymph node utilizing CT guidance. Three cores were obtained from the enlarged left external iliac chain lymph node and sent to pathology in formalin. Needle removed and hemostasis achieved with manual compression. IMPRESSION: CT-guided biopsy of enlarged left external iliac chain lymph node as above. Electronically Signed   By: Miachel Roux M.D.   On: 08/30/2020 13:17   DG Abd 2 Views  Result Date: 08/27/2020 CLINICAL DATA:  Change in bowel habits EXAM: ABDOMEN - 2 VIEW COMPARISON:  None. FINDINGS: Normal abdominal gas pattern. Moderate stool throughout the colon. No free intraperitoneal gas. No organomegaly. No abnormal calcifications within the abdomen and pelvis. No acute bone abnormality. IMPRESSION: Normal abdominal gas pattern.  Moderate stool. Electronically Signed   By: Fidela Salisbury MD   On: 08/27/2020 23:48     CT ABDOMEN PELVIS WO  CONTRAST  Result Date: 08/28/2020 CLINICAL DATA:  Abdominal pain, acute, nonlocalized EXAM: CT ABDOMEN AND PELVIS  WITHOUT CONTRAST TECHNIQUE: Multidetector CT imaging of the abdomen and pelvis was performed following the standard protocol without IV contrast. COMPARISON:  Radiograph 08/25/2020 FINDINGS: Lower chest: Lung bases are clear. No focal consolidation or pleural fluid. Small hiatal hernia. Hepatobiliary: No focal liver abnormality is seen. No gallstones, gallbladder wall thickening, or biliary dilatation. Pancreas: No ductal dilatation or inflammation. Spleen: Normal in size without focal abnormality. Adrenals/Urinary Tract: No adrenal nodule. Minimal left hydronephrosis. Left ureter is dilated to the pelvis. No renal or ureteral stone. No significant perinephric edema. No right hydronephrosis. No right renal stone. Decompressed right ureter. Urinary bladder is essentially empty and not well assessed. Stomach/Bowel: Bowel evaluation is limited in the absence of enteric contrast. There is wall thickening of the sigmoid colon with mild pericolonic edema. The mid sigmoid colon appears apposed to the uterus with obliteration of normal fat plane. Sigmoid colon is also difficult to delineate from a pelvic mass, which is also contiguous with the uterus. Please note that detailed assessment is limited on this noncontrast exam. There is a moderate to large volume of stool in the more proximal colon. No definite small bowel obstruction or inflammatory change. Appendix is normal. Small to moderate hiatal hernia. Vascular/Lymphatic: Multifocal adenopathy. Please note that detailed assessment of adenopathy is limited on this noncontrast exam. For instance, left periaortic node measures 16 mm short axis, series 2, image 30. Additional prominent and mildly enlarged lower retroperitoneal lymph nodes are seen. Right upper quadrant adenopathy with lymph nodes measuring 11 in 15 mm, series 2, image 36, likely in the  mesentery. Right lower quadrant mesenteric node measures 13 mm, series 2, image 44. There additional enlarged ileocolic lymph nodes. 9 mm right external iliac node. Rounded soft tissue mass about the left pelvic sidewall measures 2.3 x 3.3 cm, may be adnexal mass or iliac adenopathy. There is a 17 mm left external iliac node, series 2, image 58. Prominent bilateral inguinal nodes. Reproductive: Pelvic mass just to the left of midline measures 7.5 x 4.8 cm, and is contiguous with the sigmoid colon and uterus. The ovary is not definitively visualized. Right ovary is not definitively seen. Other: Mild fat stranding in the pelvis as well as presacral region. Minimal omental edema without obvious omental thickening or mass. No ascites. No free air. Musculoskeletal: No acute osseous abnormality or focal bone lesion. No subcutaneous abnormality. IMPRESSION: 1. Pelvic mass measuring 7.5 x 4.8 cm, contiguous with the sigmoid colon and uterus with loss of fat planes. Site of origin is unclear, may be ovarian, uterine, or colonic. Lack of contrast limits detailed assessment. 2. Above pelvic mass causes partial obstruction of the left ureter with mild left hydroureteronephrosis. 3. Multifocal adenopathy in the abdomen and pelvis, suspicious for metastatic disease, primarily in the mesentery and lower peritoneum and pelvis. Please note that detailed assessment of adenopathy is limited on this noncontrast exam. Recommend oncology referral. 4. Wall thickening of the sigmoid colon distal to the left pelvic mass with pericolonic edema, suspicious for colitis. There is a moderate to large amount of stool in the more proximal colon, suggesting pelvic mass may be causing delayed colonic transit. 5. Small to moderate hiatal hernia. Electronically Signed   By: Keith Rake M.D.   On: 08/28/2020 18:30   CT BIOPSY  Result Date: 08/30/2020 INDICATION: 75 year old woman with uterine mass and pelvic lymphadenopathy presents to  interventional radiology for biopsy of left external iliac chain lymph node. EXAM: CT-guided biopsy of left external iliac chain lymph node. MEDICATIONS: None.  ANESTHESIA/SEDATION: Moderate (conscious) sedation was employed during this procedure. A total of Versed 2 mg and Fentanyl 50 mcg was administered intravenously. Moderate Sedation Time: 10 minutes. The patient's level of consciousness and vital signs were monitored continuously by radiology nursing throughout the procedure under my direct supervision. COMPLICATIONS: None immediate. PROCEDURE: Informed written consent was obtained from the patient after a thorough discussion of the procedural risks, benefits and alternatives. All questions were addressed. Maximal Sterile Barrier Technique was utilized including caps, mask, sterile gowns, sterile gloves, sterile drape, hand hygiene and skin antiseptic. A timeout was performed prior to the initiation of the procedure. Patient position supine on the CT table. Left inguinal skin prepped and draped in usual sterile fashion. Following local lidocaine administration, 17 gauge introducer needle was advanced into the enlarged left external iliac chain lymph node utilizing CT guidance. Three cores were obtained from the enlarged left external iliac chain lymph node and sent to pathology in formalin. Needle removed and hemostasis achieved with manual compression. IMPRESSION: CT-guided biopsy of enlarged left external iliac chain lymph node as above. Electronically Signed   By: Miachel Roux M.D.   On: 08/30/2020 13:17   DG Abd 2 Views  Result Date: 08/27/2020 CLINICAL DATA:  Change in bowel habits EXAM: ABDOMEN - 2 VIEW COMPARISON:  None. FINDINGS: Normal abdominal gas pattern. Moderate stool throughout the colon. No free intraperitoneal gas. No organomegaly. No abnormal calcifications within the abdomen and pelvis. No acute bone abnormality. IMPRESSION: Normal abdominal gas pattern.  Moderate stool. 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 The intent is of curative intent.  We discussed some of the risks, benefits, side-effects of carboplatin & Taxol. Treatment is intravenous, every 3 weeks followed by repeat imaging study and we will determine whether she would be a surgical candidate beyond that  Some of the short term side-effects included, though not limited to, including weight loss, life threatening infections, risk of allergic reactions, need for transfusions of blood products, nausea, vomiting, change in bowel habits, loss of hair, admission to hospital for various reasons, and risks of death.   Long term side-effects are also discussed including risks of infertility, permanent damage to nerve function, hearing loss, chronic fatigue, kidney damage with possibility needing hemodialysis, and rare secondary malignancy including bone marrow disorders.  The patient is aware that the response rates discussed earlier is not guaranteed.  After a long discussion, patient made an informed decision to proceed with the prescribed plan of care.  We discussed premedication with dexamethasone before chemotherapy.  We will get inpatient nurses to provide chemo education class I do not plan to give her chemotherapy this weekend but will start Monday morning She needs to stay to observe for possible bowel obstruction over the  weekend I explained to her I am not keen to prescribe chemotherapy over the weekend due to safety issues  Mild left hydroureteronephrosis with AKI, stable Renal function overall stable Recommend nephrostomy tube if worsening  renal function I will adjust the dose of her chemotherapy based on her most recent renal function  Microcytic anemia, thalassemia Hemoglobin has been between 8-9 Iron studies are adequate Further discussion with the patient reveals that she is of [place] descent and has been diagnosed with thalassemia many years ago  She has never required blood transfusions and as such I suspect this is thalassemia minor Monitor hemoglobin  Hyponatremia, improved Hyponatremia improved after receiving IV fluids May have a component of SIADH secondary to underlying malignancy Monitor  Hypertension Management per hospitalist  Hyperlipidemia Management per hospitalist  Type 2 diabetes mellitus Management per hospitalist  Thank you for this referral.   Mikey Bussing, DNP, AGPCNP-BC, AOCNP [name], MD

## 2020-09-01 NOTE — Procedures (Signed)
Interventional Radiology Procedure Note ° °Procedure: Single Lumen Power Port Placement   ° °Access:  Right internal jugular vein ° °Findings: Catheter tip positioned at cavoatrial junction. Port is ready for immediate use.  ° °Complications: None ° °EBL: < 10 mL ° °Recommendations:  °- Ok to shower in 24 hours °- Do not submerge for 7 days °- Routine line care  ° ° °Joash Tony, MD ° ° ° °

## 2020-09-01 NOTE — Progress Notes (Signed)
Referring Physician(s): Natale Lay  Supervising Physician: Ruthann Cancer  Patient Status:  [facility] - In-pt  Chief Complaint:  Abdominal pain/gyn malignancy  Subjective: Patient familiar to IR service from recent left pelvic sidewall mass/left external iliac chain lymph node biopsy on 4/12.  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  The pelvic malignancy does cause partial obstruction of the left ureter with mild left hydroureteronephrosis.  Patient was originally undergoing evaluation for possible left nephrostomy, however following discussion with Dr. Alvy Bimler today decision has been made to place Port-A-Cath and put hold on nephrostomy tube placement at this time.  Pt tentatively scheduled to start chemotherapy on Monday.  She currently denies fever, headache, chest pain, dyspnea, cough, back pain, nausea, vomiting or bleeding.  She does have some slight abdominal distention and  mild pelvic discomfort.  Past Medical History:  Diagnosis Date  . Anemia    chronic  . Hyperlipemia   . Hypertension   . Irritable bowel syndrome (IBS)    Past Surgical History:  Procedure Laterality Date  . BREAST EXCISIONAL BIOPSY Left    1985 benign  . HERNIA REPAIR        Allergies: Patient has no known allergies.  Medications: Prior to Admission medications   Medication Sig Start Date End Date Taking? Authorizing Provider  cholecalciferol (VITAMIN D3) 25 MCG (1000 UNIT) tablet Take 1,000 Units by mouth daily.   Yes [provider]  Ferrous Sulfate (IRON PO) Take 1 tablet by mouth daily.   Yes [provider]  losartan-hydrochlorothiazide (HYZAAR) 100-25 MG tablet Take 1 tablet by mouth daily. 06/20/20  Yes [provider]  pantoprazole (PROTONIX) 40 MG tablet Take 1 tablet by mouth every morning. 08/25/20  Yes [provider]  pravastatin (PRAVACHOL) 80 MG tablet Take 80 mg by mouth every evening. 06/19/20   Yes [provider]     Vital Signs: BP 108/73 (BP Location: Left Arm)   Pulse (!) 105   Temp 98.1 F (36.7 C) (Oral)   Resp 18   Ht 5' (1.524 m)   Wt 112 lb 11.2 oz (51.1 kg)   SpO2 100%   BMI 22.01 kg/m   Physical Exam awake, alert.  Chest clear to auscultation bilaterally.  Heart with regular rate and rhythm.  Abdomen soft, +BS, some mild pelvic tenderness to palpation. No sig LE edema.  Imaging: CT ABDOMEN PELVIS WO CONTRAST  Result Date: 08/28/2020 CLINICAL DATA:  Abdominal pain, acute, nonlocalized EXAM: CT ABDOMEN AND PELVIS WITHOUT CONTRAST TECHNIQUE: Multidetector CT imaging of the abdomen and pelvis was performed following the standard protocol without IV contrast. COMPARISON:  Radiograph 08/25/2020 FINDINGS: Lower chest: Lung bases are clear. No focal consolidation or pleural fluid. Small hiatal hernia. Hepatobiliary: No focal liver abnormality is seen. No gallstones, gallbladder wall thickening, or biliary dilatation. Pancreas: No ductal dilatation or inflammation. Spleen: Normal in size without focal abnormality. Adrenals/Urinary Tract: No adrenal nodule. Minimal left hydronephrosis. Left ureter is dilated to the pelvis. No renal or ureteral stone. No significant perinephric edema. No right hydronephrosis. No right renal stone. Decompressed right ureter. Urinary bladder is essentially empty and not well assessed. Stomach/Bowel: Bowel evaluation is limited in the absence of enteric contrast. There is wall thickening of the sigmoid colon with mild pericolonic edema. The mid sigmoid colon appears apposed to the uterus with obliteration of normal fat plane. Sigmoid colon is also difficult to delineate from a pelvic mass, which is also contiguous with the uterus. Please note  that detailed assessment is limited on this noncontrast exam. There is a moderate to large volume of stool in the more proximal colon. No definite small bowel obstruction or inflammatory change. Appendix is  normal. Small to moderate hiatal hernia. Vascular/Lymphatic: Multifocal adenopathy. Please note that detailed assessment of adenopathy is limited on this noncontrast exam. For instance, left periaortic node measures 16 mm short axis, series 2, image 30. Additional prominent and mildly enlarged lower retroperitoneal lymph nodes are seen. Right upper quadrant adenopathy with lymph nodes measuring 11 in 15 mm, series 2, image 36, likely in the mesentery. Right lower quadrant mesenteric node measures 13 mm, series 2, image 44. There additional enlarged ileocolic lymph nodes. 9 mm right external iliac node. Rounded soft tissue mass about the left pelvic sidewall measures 2.3 x 3.3 cm, may be adnexal mass or iliac adenopathy. There is a 17 mm left external iliac node, series 2, image 58. Prominent bilateral inguinal nodes. Reproductive: Pelvic mass just to the left of midline measures 7.5 x 4.8 cm, and is contiguous with the sigmoid colon and uterus. The ovary is not definitively visualized. Right ovary is not definitively seen. Other: Mild fat stranding in the pelvis as well as presacral region. Minimal omental edema without obvious omental thickening or mass. No ascites. No free air. Musculoskeletal: No acute osseous abnormality or focal bone lesion. No subcutaneous abnormality. IMPRESSION: 1. Pelvic mass measuring 7.5 x 4.8 cm, contiguous with the sigmoid colon and uterus with loss of fat planes. Site of origin is unclear, may be ovarian, uterine, or colonic. Lack of contrast limits detailed assessment. 2. Above pelvic mass causes partial obstruction of the left ureter with mild left hydroureteronephrosis. 3. Multifocal adenopathy in the abdomen and pelvis, suspicious for metastatic disease, primarily in the mesentery and lower peritoneum and pelvis. Please note that detailed assessment of adenopathy is limited on this noncontrast exam. Recommend oncology referral. 4. Wall thickening of the sigmoid colon distal to the  left pelvic mass with pericolonic edema, suspicious for colitis. There is a moderate to large amount of stool in the more proximal colon, suggesting pelvic mass may be causing delayed colonic transit. 5. Small to moderate hiatal hernia. Electronically Signed   By: Keith Rake M.D.   On: 08/28/2020 18:30   CT BIOPSY  Result Date: 08/30/2020 INDICATION: 75 year old woman with uterine mass and pelvic lymphadenopathy presents to interventional radiology for biopsy of left external iliac chain lymph node. EXAM: CT-guided biopsy of left external iliac chain lymph node. MEDICATIONS: None. ANESTHESIA/SEDATION: Moderate (conscious) sedation was employed during this procedure. A total of Versed 2 mg and Fentanyl 50 mcg was administered intravenously. Moderate Sedation Time: 10 minutes. The patient's level of consciousness and vital signs were monitored continuously by radiology nursing throughout the procedure under my direct supervision. COMPLICATIONS: None immediate. PROCEDURE: Informed written consent was obtained from the patient after a thorough discussion of the procedural risks, benefits and alternatives. All questions were addressed. Maximal Sterile Barrier Technique was utilized including caps, mask, sterile gowns, sterile gloves, sterile drape, hand hygiene and skin antiseptic. A timeout was performed prior to the initiation of the procedure. Patient position supine on the CT table. Left inguinal skin prepped and draped in usual sterile fashion. Following local lidocaine administration, 17 gauge introducer needle was advanced into the enlarged left external iliac chain lymph node utilizing CT guidance. Three cores were obtained from the enlarged left external iliac chain lymph node and sent to pathology in formalin. Needle removed and hemostasis achieved  with manual compression. IMPRESSION: CT-guided biopsy of enlarged left external iliac chain lymph node as above. Electronically Signed   By: Miachel Roux  M.D.   On: 08/30/2020 13:17    Labs:  CBC: Recent Labs    08/29/20 0538 08/30/20 0653 08/31/20 0533 09/01/20 0519  WBC 13.6* 13.0* 16.5* 13.4*  HGB 8.8* 9.4* 9.0* 8.1*  HCT 27.2* 28.9* 28.2* 26.2*  PLT 425* 445* 465* 407*    COAGS: Recent Labs    08/30/20 0653  INR 1.0    BMP: Recent Labs    08/29/20 1307 08/30/20 0653 08/31/20 0533 09/01/20 0519  NA 120* 118* 125* 125*  K 4.2 4.0 3.7 3.9  CL 86* 86* 93* 96*  CO2 21* 21* 20* 19*  GLUCOSE 136* 122* 96 99  BUN 15 14 14 10   CALCIUM 8.8* 8.5* 8.7* 8.5*  CREATININE 1.20* 1.15* 1.16* 1.17*  GFRNONAA 48* 50* 49* 49*    LIVER FUNCTION TESTS: Recent Labs    08/28/20 1648 08/29/20 1307 08/30/20 0653  BILITOT 0.5 0.5 0.8  AST 20 21 24   ALT 17 19 18   ALKPHOS 85 94 87  PROT 6.9 7.0 6.7  ALBUMIN 3.6 3.0* 2.7*    Assessment and Plan: Patient familiar to IR service from recent left pelvic sidewall mass/left external iliac chain lymph node biopsy on 4/12.  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  The pelvic malignancy does cause partial obstruction of the left ureter with mild left hydroureteronephrosis.  Patient was originally undergoing evaluation for possible left nephrostomy, however following discussion with Dr. Alvy Bimler today decision has been made to place Port-A-Cath and put hold on nephrostomy tube placement at this time.  Pt tentatively scheduled to start chemotherapy on Monday. Risks and benefits of image guided port-a-catheter placement was discussed with the patient/family including, but not limited to bleeding, infection, pneumothorax, or fibrin sheath development and need for additional procedures.  All of the patient's questions were answered, patient is agreeable to proceed. Consent signed and in chart.  Procedure scheduled for today.   Electronically Signed: D. Rowe Robert, PA-C 09/01/2020, 10:28 AM   I spent a total of 20  minutes at the the patient's bedside AND on the patient's hospital floor or unit, greater than 50% of which was counseling/coordinating care for Port-A-Cath placement    Patient ID: Teresa Oliver, female   DOB: [birth year], 75 y.o.   MRN: [id number]

## 2020-09-01 NOTE — Care Management Important Message (Signed)
Medicare IM printed to give to the patient, by Dimonique Bourdeau 

## 2020-09-01 NOTE — Progress Notes (Signed)
GYN Oncology Follow Up  Patient alert, oriented, resting in bed with husband and son at bedside. She tolerated port-a-cath placement well. No nausea or emesis reported and ordered an egg salad sandwich for lunch. Dr. Berline Lopes discussed the pathology from the IR lymph node biopsy revealing poorly differentiated carcinoma from a GYN origin. She discussed the plan for neoadjuvant chemotherapy under the direction of Dr. Alvy Bimler.   Biopsy from colonoscopy suggesting no true fistula but just extrinsic compression on the colon. Continue plan of care. Will defer PCN placement as well as diverting colon surgery at this time. Appreciate all teams involved in her care. Please let us know if we can help with anything over the weekend. Tentative plan is to start cytotoxic treatment on Monday.

## 2020-09-01 NOTE — Progress Notes (Signed)
Pelvic mass  Subjective: Underwent colonoscopy yesterday.  Able to tolerate bowel prep without obstructive symptoms  Objective: Vital signs in last 24 hours: Temp:  [97.9 F (36.6 C)-98.6 F (37 C)] 98.1 F (36.7 C) (04/15 0548) Pulse Rate:  [51-108] 105 (04/15 0548) Resp:  [11-23] 18 (04/15 0548) BP: (78-135)/(50-85) 108/73 (04/15 0548) SpO2:  [86 %-100 %] 100 % (04/15 0548) Last BM Date: 08/31/20  Intake/Output from previous day: 04/14 0701 - 04/15 0700 In: 740 [P.O.:240; I.V.:500] Out: -  Intake/Output this shift: No intake/output data recorded.  General appearance: alert Abd: soft  Lab Results:  Results for orders placed or performed during the hospital encounter of 08/28/20 (from the past 24 hour(s))  Glucose, capillary     Status: Abnormal   Collection Time: 08/31/20  2:20 PM  Result Value Ref Range   Glucose-Capillary 64 (L) 70 - 99 mg/dL   Comment 1 Notify RN    Comment 2 Document in Chart   Glucose, capillary     Status: Abnormal   Collection Time: 08/31/20  6:45 PM  Result Value Ref Range   Glucose-Capillary 124 (H) 70 - 99 mg/dL   Comment 1 Notify RN    Comment 2 Document in Chart   Glucose, capillary     Status: Abnormal   Collection Time: 08/31/20 11:54 PM  Result Value Ref Range   Glucose-Capillary 109 (H) 70 - 99 mg/dL  Basic metabolic panel     Status: Abnormal   Collection Time: 09/01/20  5:19 AM  Result Value Ref Range   Sodium 125 (L) 135 - 145 mmol/L   Potassium 3.9 3.5 - 5.1 mmol/L   Chloride 96 (L) 98 - 111 mmol/L   CO2 19 (L) 22 - 32 mmol/L   Glucose, Bld 99 70 - 99 mg/dL   BUN 10 8 - 23 mg/dL   Creatinine, Ser 1.17 (H) 0.44 - 1.00 mg/dL   Calcium 8.5 (L) 8.9 - 10.3 mg/dL   GFR, Estimated 49 (L) >60 mL/min   Anion gap 10 5 - 15  CBC with Differential/Platelet     Status: Abnormal   Collection Time: 09/01/20  5:19 AM  Result Value Ref Range   WBC 13.4 (H) 4.0 - 10.5 K/uL   RBC 3.89 3.87 - 5.11 MIL/uL   Hemoglobin 8.1 (L) 12.0 - 15.0  g/dL   HCT 26.2 (L) 36.0 - 46.0 %   MCV 67.4 (L) 80.0 - 100.0 fL   MCH 20.8 (L) 26.0 - 34.0 pg   MCHC 30.9 30.0 - 36.0 g/dL   RDW 13.9 11.5 - 15.5 %   Platelets 407 (H) 150 - 400 K/uL   nRBC 0.0 0.0 - 0.2 %   Neutrophils Relative % 80 %   Neutro Abs 10.8 (H) 1.7 - 7.7 K/uL   Lymphocytes Relative 8 %   Lymphs Abs 1.1 0.7 - 4.0 K/uL   Monocytes Relative 7 %   Monocytes Absolute 0.9 0.1 - 1.0 K/uL   Eosinophils Relative 2 %   Eosinophils Absolute 0.2 0.0 - 0.5 K/uL   Basophils Relative 0 %   Basophils Absolute 0.0 0.0 - 0.1 K/uL   Immature Granulocytes 3 %   Abs Immature Granulocytes 0.34 (H) 0.00 - 0.07 K/uL  Magnesium     Status: Abnormal   Collection Time: 09/01/20  5:19 AM  Result Value Ref Range   Magnesium 1.5 (L) 1.7 - 2.4 mg/dL  Phosphorus     Status: None   Collection Time: 09/01/20  5:19 AM  Result Value Ref Range   Phosphorus 2.6 2.5 - 4.6 mg/dL  Glucose, capillary     Status: Abnormal   Collection Time: 09/01/20  5:51 AM  Result Value Ref Range   Glucose-Capillary 102 (H) 70 - 99 mg/dL     Studies/Results Radiology     MEDS, Scheduled . heparin  5,000 Units Subcutaneous Q8H  . melatonin  3 mg Oral QHS  . senna-docusate  1 tablet Oral BID  . sodium chloride flush  3 mL Intravenous Q12H     Assessment: Pelvic mass   Plan: Awaiting path results.  Does not appear to be colonic in origin.  Patient without signs of obstruction.    Advance diet as tolerated if able  Will discuss with Dr Berline Lopes the need for diverting colostomy next week   LOS: 4 days    Rosario Adie, MD [facility], [place]    09/01/2020 7:45 AM

## 2020-09-01 NOTE — Progress Notes (Signed)
Patient is alert and oriented. Refusing bed alarm at this time. Pt. Encouraged to use bed alarm and call for assist. Pt. reports she will not be happy if bed alarm is turned on. Will continue to try and educate patient.Roderick Pee

## 2020-09-01 NOTE — Discharge Instructions (Signed)
Fingerstick glucose (sugar) goals for home: Before meals: 80-130 mg/dl 2-Hours after meals: less than 180 mg/dl Hemoglobin A1c goal: 7% or less  Symptoms of Hypoglycemia: Silly, Sweaty, Shaky Check sugar if you have your meter.  If near or less than 70 mg/dl, treat with 1/2 cup juice or soda or take glucose tablets Check sugar 15 minutes after treatment.  If sugar still near or less than 70 mg/al and symptomatic, treat again and may need a snack with some protein (peanut butter with crackers, etc)  Symptoms of Hyperglycemia:  Tired, Thirsty, Tinkling (frequent trips to urinate) Check sugars every 4 hours if needed Stay hydrated with Water or Non-Calorie drinks Take your Meds if ordered Call your MD

## 2020-09-01 NOTE — Progress Notes (Signed)
   09/01/20 1443  Assess: MEWS Score  Temp 98 F (36.7 C)  BP 130/88  Pulse Rate (!) 120  Resp 16  SpO2 100 %  O2 Device Room Air  Assess: MEWS Score  MEWS Temp 0  MEWS Systolic 0  MEWS Pulse 2  MEWS RR 0  MEWS LOC 0  MEWS Score 2  MEWS Score Color Yellow  Assess: if the MEWS score is Yellow or Red  Were vital signs taken at a resting state? Yes  Focused Assessment No change from prior assessment  Early Detection of Sepsis Score *See Row Information* Low  MEWS guidelines implemented *See Row Information* Yes  Treat  MEWS Interventions Other (Comment) (waiting for DR to respond to message)  Pain Scale 0-10  Pain Score 0  Take Vital Signs  Increase Vital Sign Frequency  Yellow: Q 2hr X 2 then Q 4hr X 2, if remains yellow, continue Q 4hrs  Escalate  MEWS: Escalate Yellow: discuss with charge nurse/RN and consider discussing with provider and RRT  Notify: Charge Nurse/RN  Name of Charge Nurse/RN Notified [place]  Date Charge Nurse/RN Notified 09/01/20  Time Charge Nurse/RN Notified 1443  Notify: Provider  Provider Name/Title [place]  Date Provider Notified 09/01/20  Time Provider Notified 1443  Notification Type Page  Notification Reason Other (Comment) (Requesting anxiety medicatin)  Document  Progress note created (see row info) Yes    Patient is not in pain, but is having anxiety due to finding out that she will be starting chemotherapy on Monday.

## 2020-09-02 DIAGNOSIS — R1900 Intra-abdominal and pelvic swelling, mass and lump, unspecified site: Secondary | ICD-10-CM | POA: Diagnosis not present

## 2020-09-02 LAB — RENAL FUNCTION PANEL
Albumin: 2.2 g/dL — ABNORMAL LOW (ref 3.5–5.0)
Anion gap: 9 (ref 5–15)
BUN: 16 mg/dL (ref 8–23)
CO2: 20 mmol/L — ABNORMAL LOW (ref 22–32)
Calcium: 8.1 mg/dL — ABNORMAL LOW (ref 8.9–10.3)
Chloride: 99 mmol/L (ref 98–111)
Creatinine, Ser: 1.02 mg/dL — ABNORMAL HIGH (ref 0.44–1.00)
GFR, Estimated: 58 mL/min — ABNORMAL LOW (ref >60)
Glucose, Bld: 122 mg/dL — ABNORMAL HIGH (ref 70–99)
Phosphorus: 2.3 mg/dL — ABNORMAL LOW (ref 2.5–4.6)
Potassium: 3.6 mmol/L (ref 3.5–5.1)
Sodium: 128 mmol/L — ABNORMAL LOW (ref 135–145)

## 2020-09-02 LAB — CBC WITH DIFFERENTIAL/PLATELET
Abs Immature Granulocytes: 0.25 10*3/uL — ABNORMAL HIGH (ref 0.00–0.07)
Basophils Absolute: 0 10*3/uL (ref 0.0–0.1)
Basophils Relative: 0 %
Eosinophils Absolute: 0.2 10*3/uL (ref 0.0–0.5)
Eosinophils Relative: 1 %
HCT: 22.3 % — ABNORMAL LOW (ref 36.0–46.0)
Hemoglobin: 7.2 g/dL — ABNORMAL LOW (ref 12.0–15.0)
Immature Granulocytes: 2 %
Lymphocytes Relative: 8 %
Lymphs Abs: 0.9 10*3/uL (ref 0.7–4.0)
MCH: 21.3 pg — ABNORMAL LOW (ref 26.0–34.0)
MCHC: 32.3 g/dL (ref 30.0–36.0)
MCV: 66 fL — ABNORMAL LOW (ref 80.0–100.0)
Monocytes Absolute: 0.9 10*3/uL (ref 0.1–1.0)
Monocytes Relative: 8 %
Neutro Abs: 9.7 10*3/uL — ABNORMAL HIGH (ref 1.7–7.7)
Neutrophils Relative %: 81 %
Platelets: 396 10*3/uL (ref 150–400)
RBC: 3.38 MIL/uL — ABNORMAL LOW (ref 3.87–5.11)
RDW: 13.9 % (ref 11.5–15.5)
WBC: 12 10*3/uL — ABNORMAL HIGH (ref 4.0–10.5)
nRBC: 0 % (ref 0.0–0.2)

## 2020-09-02 LAB — GLUCOSE, CAPILLARY
Glucose-Capillary: 115 mg/dL — ABNORMAL HIGH (ref 70–99)
Glucose-Capillary: 119 mg/dL — ABNORMAL HIGH (ref 70–99)
Glucose-Capillary: 136 mg/dL — ABNORMAL HIGH (ref 70–99)
Glucose-Capillary: 141 mg/dL — ABNORMAL HIGH (ref 70–99)

## 2020-09-02 LAB — BASIC METABOLIC PANEL
Anion gap: 9 (ref 5–15)
BUN: 15 mg/dL (ref 8–23)
CO2: 20 mmol/L — ABNORMAL LOW (ref 22–32)
Calcium: 8.2 mg/dL — ABNORMAL LOW (ref 8.9–10.3)
Chloride: 100 mmol/L (ref 98–111)
Creatinine, Ser: 1.13 mg/dL — ABNORMAL HIGH (ref 0.44–1.00)
GFR, Estimated: 51 mL/min — ABNORMAL LOW (ref >60)
Glucose, Bld: 121 mg/dL — ABNORMAL HIGH (ref 70–99)
Potassium: 3.6 mmol/L (ref 3.5–5.1)
Sodium: 129 mmol/L — ABNORMAL LOW (ref 135–145)

## 2020-09-02 LAB — TSH: TSH: 1.516 u[IU]/mL (ref 0.350–4.500)

## 2020-09-02 LAB — SODIUM, URINE, RANDOM: Sodium, Ur: 44 mmol/L

## 2020-09-02 LAB — OSMOLALITY: Osmolality: 271 mOsm/kg — ABNORMAL LOW (ref 275–295)

## 2020-09-02 LAB — CORTISOL: Cortisol, Plasma: 22 ug/dL

## 2020-09-02 LAB — MAGNESIUM: Magnesium: 1.8 mg/dL (ref 1.7–2.4)

## 2020-09-02 LAB — OSMOLALITY, URINE: Osmolality, Ur: 437 mOsm/kg (ref 300–900)

## 2020-09-02 LAB — PHOSPHORUS: Phosphorus: 2.3 mg/dL — ABNORMAL LOW (ref 2.5–4.6)

## 2020-09-02 MED ORDER — OXYBUTYNIN CHLORIDE 5 MG PO TABS
5.0000 mg | ORAL_TABLET | Freq: Three times a day (TID) | ORAL | Status: DC | PRN
Start: 2020-09-02 — End: 2020-09-05
  Administered 2020-09-02: 5 mg via ORAL
  Filled 2020-09-02: qty 1

## 2020-09-02 NOTE — Progress Notes (Signed)
Patient placed on bed alarm, despite her strong opposition. Patient is very unsteady and appears frequently forgetful. She is impulsive/anxious and multiple times almost ripped IV out. Will monitor closely.Roderick Pee

## 2020-09-02 NOTE — Progress Notes (Signed)
Patient did not have enough urine this am for sample. Will let day RN to monitor output closely and collect sample.Teresa Oliver

## 2020-09-02 NOTE — Progress Notes (Signed)
   09/01/20 2200  Assess: MEWS Score  Level of Consciousness Alert  Assess: MEWS Score  MEWS Temp 0  MEWS Systolic 0  MEWS Pulse 2  MEWS RR 0  MEWS LOC 0  MEWS Score 2  MEWS Score Color Yellow  Assess: if the MEWS score is Yellow or Red  Were vital signs taken at a resting state? Yes  Focused Assessment No change from prior assessment  Early Detection of Sepsis Score *See Row Information* Low  MEWS guidelines implemented *See Row Information* No, previously yellow, continue vital signs every 4 hours  Treat  MEWS Interventions Administered scheduled meds/treatments  Pain Scale 0-10  Pain Score 0  Take Vital Signs  Increase Vital Sign Frequency  Yellow: Q 2hr X 2 then Q 4hr X 2, if remains yellow, continue Q 4hrs  Escalate  MEWS: Escalate Yellow: discuss with charge nurse/RN and consider discussing with provider and RRT  Notify: Charge Nurse/RN  Name of Charge Nurse/RN Notified self  Date Charge Nurse/RN Notified  (n/a)  Time Charge Nurse/RN Notified  (n/a)  Notify: Provider  Provider Name/Title n/a; only elevated pulse  Notify: Rapid Response  Name of Rapid Response RN Notified n/a; not necessary at this time.  Document  Patient Outcome Stabilized after interventions  Progress note created (see row info) Yes

## 2020-09-02 NOTE — Progress Notes (Signed)
Patient every 30 minutes or so had to get up to use the bathroom. Patient reports she kept feeling pressure and needed to go. Patient is extremely frustrated that this RN kept the bed alarm on her throughout the night.  I observed patient multiple times and she was again impulsive, pulling out IV's, jumping in/out of bed, and had an unsteady gait.  Patient was educated on the importance of patient safety.  When asked, she reported that she was up frequently at home prior to admission. Patient is only voiding a few drops at a time each time she urinates.  NP on call was notified about increased frequency of urination w/ oliguria. Patient was bladder scanned last night and only 110 cc's were in bladder.  NP ordered ditropan and will discuss with day MD. Ditropan given. Patient may need foley for better monitoring of output. Roderick Pee

## 2020-09-02 NOTE — Progress Notes (Signed)
PROGRESS NOTE  Teresa Oliver  DOB: Oct 13, 1945  PCP: Jani Gravel, MD IFO:[id number]  DOA: 08/28/2020  LOS: 5 days   Chief complaint: Abnormal blood work  Brief narrative: Teresa Oliver is a 75 y.o. female with PMH significant for HTN, HLD, IBS who was found to have electrolyte abnormality in outpatient lab work done by GI and hence sent to ED.  Patient also complained of constipation for 3 to 4 days, poor oral intake for last several weeks and 8 pounds of weight loss in 2 months. In the ED, CT abdomen/pelvis which showed a pelvic mass measuring 7.5 x 4.8 cm, contiguous with the sigmoid colon and uterus. There was also partial obstruction of the left ureter with mild left hydroureteronephrosis.  Multifocal adenopathy in the abdomen and pelvis suspicious for metastatic disease. Also moderate to large amount of stool in proximal colon " suggesting pelvic mass may be causing delayed colonic transit." She was directly admitted to hospital service at [facility] long. Consultation obtained from gynecological oncology, urology, GI, IR.  4/13, underwent biopsy of the pelvic mass by IR 4/14, underwent colonoscopy and biopsy of the sigmoid colon See below for more details. 09/01/2020: Patient seen alongside patient's husband and son.  Patient seems to be bit anxious.  Patient reported having been given a lot of information, including but not limited to planned chemotherapy.  Tachycardia is reported.  We will proceed with EKG, telemetry monitoring, IV fluids, repletion of magnesium, and brief low-dose as needed lorazepam.  Will work-up hyponatremia weight urine sodium, urine osmolality, serum osmolality, cortisol and TSH.  AM labs. 09/02/2020: Patient seen alongside patient's daughter.  No new complaints.  Patient is less anxious today.  Heart rate is better controlled.  Oncology team is directing care.  Input from urology team is appreciated.  Subjective: -Less watery today.   -No chest pain. -No shortness  of breath.    Assessment/Plan: Pelvic mass with multifocal adenopathy Mild left hydroureteronephrosis Partial obstruction of sigmoid colon -Presented with constipation, poor oral intake, abnormal electrolytes -CT abdomen pelvis showed pelvic mass with multifocal adenopathy with secondary compression to the left ureter as well as bowel. -GYN oncology, urology and GI consultation appreciated.   -4/13, patient underwent biopsy of the mass by IR.   -4/14, patient underwent colonoscopy and biopsy of the sigmoid colon. -Patient seems to have metastatic disease of unknown primary causing partial obstruction of the left ureter and sigmoid colon.  Per GYN oncology note from today, patient would need a left nephrostomy tube as well as colonic diversion. -According to her note, IR will do left nephrostomy tube tomorrow and general surgery may do a diverting ostomy next week.  Constipation History of IBS -Delayed colonic transit due to compression from pelvic mass -Senokot scheduled.  MiraLAX as needed.  Elevated creatinine -No baseline available from past. -Presented with creatinine of 1.22. Roughly stable. Recent Labs    08/28/20 1648 08/28/20 2138 08/29/20 0538 08/29/20 1307 08/30/20 0653 08/31/20 0533 09/01/20 0519 09/02/20 0653  BUN 18 17 16 15 14 14 10 15  16   CREATININE 1.22* 1.24* 1.28* 1.20* 1.15* 1.16* 1.17* 1.13*  1.02*   Hyponatremia -Likely hypovolemic due to poor oral intake. But also suspected SIADH because of coexisting malignancy. -Sodium level improving today.  Not on IV fluid. 09/01/2020: Check urine sodium, urine osmolality, serum osmolality.  We will also check TSH and cortisol level.  We will start patient on IV fluids. Recent Labs  Lab 08/28/20 1648 08/28/20 2138 08/29/20 6767  08/29/20 1307 08/30/20 0653 08/31/20 0533 09/01/20 0519 09/02/20 0653  NA 117* 120* 119* 120* 118* 125* 125* 129*  128*   Hypokalemia/hypomagnesemia/hypophosphatemia -Low levels  of potassium, magnesium and phosphorus due to poor oral intake -Aggressively replaced with improvement. Recent Labs  Lab 08/29/20 0538 08/29/20 1307 08/30/20 0653 08/31/20 0533 09/01/20 0519 09/02/20 0653  K 4.0 4.2 4.0 3.7 3.9 3.6  3.6  MG 2.2  --  1.7 1.7 1.5* 1.8  PHOS 1.8*  --  3.1 2.8 2.6 2.3*  2.3*   Acute metabolic encephalopathy -Patient cheerful but remains confused, unable to recollect recent details. -Multifactorial: Poor oral intake, abnormal electrolytes, malignancy -Continue monitor mental status change  Hypertension History of essential hypertension-On losartan and hydrochlorothiazide at home.  Currently on hold because of low blood pressure -Continue to monitor blood pressure.  Diabetes mellitus type II -A1c 7 on 4/12  -Not on antidiabetic regimen.  Poor oral intake.  Would try dietary measures and target A1c less than 8. -Sliding scale insulin with Accu-Cheks Recent Labs  Lab 09/01/20 1209 09/01/20 1722 09/01/20 2239 09/02/20 0529 09/02/20 1207  GLUCAP 115* 130* 130* 119* 136*   Hyperlipidemia -On pravastatin.  Mobility: Encourage ambulation. Code Status:   Code Status: Full Code  Nutritional status: Body mass index is 22.01 kg/m. Nutrition Problem: Moderate Malnutrition Etiology: chronic illness Signs/Symptoms: percent weight loss,energy intake < or equal to 75% for > or equal to 1 month,mild fat depletion,mild muscle depletion Diet Order            DIET SOFT Room service appropriate? Yes; Fluid consistency: Thin  Diet effective now                 DVT prophylaxis: heparin injection 5,000 Units Start: 08/30/20 2200   Antimicrobials:  None Fluid: Not on IV fluid Consultants: Urology, gynecology, GI, IR, general surgery Family Communication:  None at bedside  Status is: Inpatient  Remains inpatient appropriate because:  pending nephrostomy tube placement, may need colostomy as well  Dispo: The patient is from: Home               Anticipated d/c is to: Home likely              Patient currently is not medically stable to d/c.   Difficult to place patient No       Infusions:  . sodium chloride    . sodium chloride 75 mL/hr at 09/02/20 0225    Scheduled Meds: . Chlorhexidine Gluconate Cloth  6 each Topical Daily  . [START ON 09/03/2020] dexamethasone  8 mg Oral Once  . [START ON 09/04/2020] dexamethasone  8 mg Oral Once  . feeding supplement (KATE FARMS STANDARD 1.4)  325 mL Oral BID BM  . heparin  5,000 Units Subcutaneous Q8H  . melatonin  3 mg Oral QHS  . senna-docusate  1 tablet Oral BID  . sodium chloride flush  3 mL Intravenous Q12H    Antimicrobials: Anti-infectives (From admission, onward)   None      PRN meds: acetaminophen **OR** acetaminophen, LORazepam, ondansetron **OR** ondansetron (ZOFRAN) IV, oxybutynin, oxyCODONE, polyethylene glycol   Objective: Vitals:   09/02/20 0531 09/02/20 1404  BP: 109/73 108/67  Pulse: (!) 108 (!) 106  Resp: 16 18  Temp: 98.2 F (36.8 C) 98.4 F (36.9 C)  SpO2: 97% 100%    Intake/Output Summary (Last 24 hours) at 09/02/2020 1601 Last data filed at 09/02/2020 0600 Gross per 24 hour  Intake 1628.75 ml  Output --  Net 1628.75 ml   Filed Weights   08/28/20 1629  Weight: 51.1 kg   Weight change:  Body mass index is 22.01 kg/m.   Physical Exam: General exam: Pleasant elderly Caucasian female.  Patient is alert, today.   HEENT: Atraumatic, normocephalic, no obvious bleeding.  Dry buccal mucosa. Lungs: Clear to auscultation bilaterally CVS: S1-S2, tachycardic.   GI/Abd soft, nontender, nondistended, bowel sound present CNS: Alert, awake, oriented x3.  Patient moves all extremities. Extremities: No pedal edema, no calf tenderness  Data Review: I have personally reviewed the laboratory data and studies available.  Recent Labs  Lab 08/29/20 0538 08/30/20 0653 08/31/20 0533 09/01/20 0519 09/02/20 0653  WBC 13.6* 13.0* 16.5* 13.4* 12.0*   NEUTROABS 11.4* 10.7* 12.9* 10.8* 9.7*  HGB 8.8* 9.4* 9.0* 8.1* 7.2*  HCT 27.2* 28.9* 28.2* 26.2* 22.3*  MCV 65.5* 65.8* 66.7* 67.4* 66.0*  PLT 425* 445* 465* 407* 396   Recent Labs  Lab 08/29/20 0538 08/29/20 1307 08/30/20 0653 08/31/20 0533 09/01/20 0519 09/02/20 0653  NA 119* 120* 118* 125* 125* 129*  128*  K 4.0 4.2 4.0 3.7 3.9 3.6  3.6  CL 86* 86* 86* 93* 96* 100  99  CO2 23 21* 21* 20* 19* 20*  20*  GLUCOSE 125* 136* 122* 96 99 121*  122*  BUN 16 15 14 14 10 15  16   CREATININE 1.28* 1.20* 1.15* 1.16* 1.17* 1.13*  1.02*  CALCIUM 8.6* 8.8* 8.5* 8.7* 8.5* 8.2*  8.1*  MG 2.2  --  1.7 1.7 1.5* 1.8  PHOS 1.8*  --  3.1 2.8 2.6 2.3*  2.3*    F/u labs ordered. Unresulted Labs (From admission, onward)          Start     Ordered   09/04/20 0000  Prealbumin  Weekly,   R      08/30/20 1430   09/02/20 0500  Osmolality, urine  Tomorrow morning,   R        09/01/20 1604   08/30/20 0500  CBC with Differential/Platelet  Tomorrow morning,   R        08/29/20 1318          Signed, Bonnell Public, MD [facility] 09/02/2020

## 2020-09-03 DIAGNOSIS — R1900 Intra-abdominal and pelvic swelling, mass and lump, unspecified site: Secondary | ICD-10-CM | POA: Diagnosis not present

## 2020-09-03 LAB — GLUCOSE, CAPILLARY
Glucose-Capillary: 134 mg/dL — ABNORMAL HIGH (ref 70–99)
Glucose-Capillary: 136 mg/dL — ABNORMAL HIGH (ref 70–99)
Glucose-Capillary: 151 mg/dL — ABNORMAL HIGH (ref 70–99)
Glucose-Capillary: 176 mg/dL — ABNORMAL HIGH (ref 70–99)

## 2020-09-03 NOTE — Progress Notes (Signed)
PROGRESS NOTE  Teresa Oliver  DOB: 1945/06/12  PCP: Jani Gravel, MD MBW:[id number]  DOA: 08/28/2020  LOS: 6 days   Chief complaint: Abnormal blood work  Brief narrative: Teresa Oliver is a 75 y.o. female with PMH significant for HTN, HLD, IBS who was found to have electrolyte abnormality in outpatient lab work done by GI and hence sent to ED.  Patient also complained of constipation for 3 to 4 days, poor oral intake for last several weeks and 8 pounds of weight loss in 2 months. In the ED, CT abdomen/pelvis which showed a pelvic mass measuring 7.5 x 4.8 cm, contiguous with the sigmoid colon and uterus. There was also partial obstruction of the left ureter with mild left hydroureteronephrosis.  Multifocal adenopathy in the abdomen and pelvis suspicious for metastatic disease. Also moderate to large amount of stool in proximal colon " suggesting pelvic mass may be causing delayed colonic transit." She was directly admitted to hospital service at [facility]. Consultation obtained from gynecological oncology, urology, GI, IR.  4/13, underwent biopsy of the pelvic mass by IR 4/14, underwent colonoscopy and biopsy of the sigmoid colon See below for more details. 09/01/2020: Patient seen alongside patient's husband and son.  Patient seems to be bit anxious.  Patient reported having been given a lot of information, including but not limited to planned chemotherapy.  Tachycardia is reported.  We will proceed with EKG, telemetry monitoring, IV fluids, repletion of magnesium, and brief low-dose as needed lorazepam.  Will work-up hyponatremia weight urine sodium, urine osmolality, serum osmolality, cortisol and TSH.  AM labs. 09/02/2020: Patient seen alongside patient's daughter.  No new complaints.  Patient is less anxious today.  Heart rate is better controlled.  Oncology team is directing care.  Input from urology team is appreciated. 09/03/2020: Patient seen alongside patient's husband and son.   Patient wants telemetry monitoring discontinued.  We will also start IV fluids.  Patient wants to be able to ambulate.  Will consult physical therapy team.  Subjective: -No fever or chills. -No chest pain. -No shortness of breath.    Assessment/Plan: Pelvic mass with multifocal adenopathy Mild left hydroureteronephrosis Partial obstruction of sigmoid colon -Presented with constipation, poor oral intake, abnormal electrolytes -CT abdomen pelvis showed pelvic mass with multifocal adenopathy with secondary compression to the left ureter as well as bowel. -GYN oncology, urology and GI consultation appreciated.   -4/13, patient underwent biopsy of the mass by IR.   -4/14, patient underwent colonoscopy and biopsy of the sigmoid colon. -Patient seems to have metastatic disease of unknown primary causing partial obstruction of the left ureter and sigmoid colon.  Per GYN oncology note from today, patient would need a left nephrostomy tube as well as colonic diversion. -According to her note, IR will do left nephrostomy tube tomorrow and general surgery may do a diverting ostomy next week. 09/03/2020: We will defer management to oncology and urology team.  I understand that chemotherapy is planned for this week.  Constipation History of IBS -Delayed colonic transit due to compression from pelvic mass -Senokot scheduled.  MiraLAX as needed.  Elevated creatinine -No baseline available from past. -Presented with creatinine of 1.22.  09/03/2020: Serum creatinine continues to improve. Recent Labs    08/28/20 1648 08/28/20 2138 08/29/20 0538 08/29/20 1307 08/30/20 0653 08/31/20 0533 09/01/20 0519 09/02/20 0653  BUN 18 17 16 15 14 14 10 15  16   CREATININE 1.22* 1.24* 1.28* 1.20* 1.15* 1.16* 1.17* 1.13*  1.02*   Hyponatremia -  Likely hypovolemic due to poor oral intake. But also suspected SIADH because of coexisting malignancy. -Sodium level improving today.  Not on IV fluid. 09/01/2020:  Check urine sodium, urine osmolality, serum osmolality.  We will also check TSH and cortisol level.  We will start patient on IV fluids. 09/03/2020: Secondary to SIADH and volume depletion.  Volume depletion has resolved.  Sodium is 130 today.  Continue to monitor closely.  Sodium chloride tablet 1 tablet daily if needed. Recent Labs  Lab 08/28/20 1648 08/28/20 2138 08/29/20 0538 08/29/20 1307 08/30/20 0653 08/31/20 0533 09/01/20 0519 09/02/20 0653  NA 117* 120* 119* 120* 118* 125* 125* 129*  128*   Hypokalemia/hypomagnesemia/hypophosphatemia -Low levels of potassium, magnesium and phosphorus due to poor oral intake -Aggressively replaced with improvement. Recent Labs  Lab 08/29/20 0538 08/29/20 1307 08/30/20 0653 08/31/20 0533 09/01/20 0519 09/02/20 0653  K 4.0 4.2 4.0 3.7 3.9 3.6  3.6  MG 2.2  --  1.7 1.7 1.5* 1.8  PHOS 1.8*  --  3.1 2.8 2.6 2.3*  2.3*   Acute metabolic encephalopathy -Patient cheerful but remains confused, unable to recollect recent details. -Multifactorial: Poor oral intake, abnormal electrolytes, malignancy -Continue monitor mental status change 09/03/2020: Encephalopathy has improved significantly/resolved.  Hypertension History of essential hypertension-On losartan and hydrochlorothiazide at home.  Currently on hold because of low blood pressure -Continue to monitor blood pressure.  Diabetes mellitus type II -A1c 7 on 4/12  -Not on antidiabetic regimen.  Poor oral intake.  Would try dietary measures and target A1c less than 8. -Sliding scale insulin with Accu-Cheks Recent Labs  Lab 09/02/20 1207 09/02/20 1807 09/02/20 2351 09/03/20 0527 09/03/20 1108  GLUCAP 136* 141* 115* 136* 134*   Hyperlipidemia -On pravastatin.  Mobility: Encourage ambulation. Code Status:   Code Status: Full Code  Nutritional status: Body mass index is 22.01 kg/m. Nutrition Problem: Moderate Malnutrition Etiology: chronic illness Signs/Symptoms: percent  weight loss,energy intake < or equal to 75% for > or equal to 1 month,mild fat depletion,mild muscle depletion Diet Order            DIET SOFT Room service appropriate? Yes; Fluid consistency: Thin  Diet effective now                 DVT prophylaxis: heparin injection 5,000 Units Start: 08/30/20 2200   Antimicrobials:  None Fluid: Not on IV fluid Consultants: Urology, gynecology, GI, IR, general surgery Family Communication:  None at bedside  Status is: Inpatient  Remains inpatient appropriate because:  pending nephrostomy tube placement, may need colostomy as well  Dispo: The patient is from: Home              Anticipated d/c is to: Home likely              Patient currently is not medically stable to d/c.   Difficult to place patient No       Infusions:  . sodium chloride    . sodium chloride 75 mL/hr at 09/02/20 0225    Scheduled Meds: . Chlorhexidine Gluconate Cloth  6 each Topical Daily  . dexamethasone  8 mg Oral Once  . [START ON 09/04/2020] dexamethasone  8 mg Oral Once  . feeding supplement (KATE FARMS STANDARD 1.4)  325 mL Oral BID BM  . heparin  5,000 Units Subcutaneous Q8H  . melatonin  3 mg Oral QHS  . senna-docusate  1 tablet Oral BID  . sodium chloride flush  3 mL Intravenous Q12H  Antimicrobials: Anti-infectives (From admission, onward)   None      PRN meds: acetaminophen **OR** acetaminophen, ondansetron **OR** ondansetron (ZOFRAN) IV, oxybutynin, oxyCODONE, polyethylene glycol   Objective: Vitals:   09/03/20 0525 09/03/20 1332  BP: 121/77 128/84  Pulse: (!) 110 (!) 104  Resp: 16 18  Temp: 98 F (36.7 C) 98.2 F (36.8 C)  SpO2: 94% 96%    Intake/Output Summary (Last 24 hours) at 09/03/2020 1514 Last data filed at 09/03/2020 1004 Gross per 24 hour  Intake 120 ml  Output 150 ml  Net -30 ml   Filed Weights   08/28/20 1629  Weight: 51.1 kg   Weight change:  Body mass index is 22.01 kg/m.   Physical Exam: General exam:  Pleasant elderly Caucasian female.  Patient is alert, today.   HEENT: Atraumatic, normocephalic, no obvious bleeding.  Dry buccal mucosa. Lungs: Clear to auscultation bilaterally CVS: S1-S2, tachycardic.   GI/Abd soft, nontender, nondistended, bowel sound present CNS: Alert, awake, oriented x3.  Patient moves all extremities. Extremities: No pedal edema, no calf tenderness  Data Review: I have personally reviewed the laboratory data and studies available.  Recent Labs  Lab 08/29/20 0538 08/30/20 0653 08/31/20 0533 09/01/20 0519 09/02/20 0653  WBC 13.6* 13.0* 16.5* 13.4* 12.0*  NEUTROABS 11.4* 10.7* 12.9* 10.8* 9.7*  HGB 8.8* 9.4* 9.0* 8.1* 7.2*  HCT 27.2* 28.9* 28.2* 26.2* 22.3*  MCV 65.5* 65.8* 66.7* 67.4* 66.0*  PLT 425* 445* 465* 407* 396   Recent Labs  Lab 08/29/20 0538 08/29/20 1307 08/30/20 0653 08/31/20 0533 09/01/20 0519 09/02/20 0653  NA 119* 120* 118* 125* 125* 129*  128*  K 4.0 4.2 4.0 3.7 3.9 3.6  3.6  CL 86* 86* 86* 93* 96* 100  99  CO2 23 21* 21* 20* 19* 20*  20*  GLUCOSE 125* 136* 122* 96 99 121*  122*  BUN 16 15 14 14 10 15  16   CREATININE 1.28* 1.20* 1.15* 1.16* 1.17* 1.13*  1.02*  CALCIUM 8.6* 8.8* 8.5* 8.7* 8.5* 8.2*  8.1*  MG 2.2  --  1.7 1.7 1.5* 1.8  PHOS 1.8*  --  3.1 2.8 2.6 2.3*  2.3*    F/u labs ordered. Unresulted Labs (From admission, onward)          Start     Ordered   09/04/20 0500  Renal function panel  Tomorrow morning,   R       Question:  Specimen collection method  Answer:  Unit=Unit collect   09/03/20 0953   09/04/20 0000  Prealbumin  Weekly,   R (with TIMED occurrences)      08/30/20 1430   08/30/20 0500  CBC with Differential/Platelet  Tomorrow morning,   R        08/29/20 1318          Signed, Bonnell Public, MD [facility] 09/03/2020

## 2020-09-04 ENCOUNTER — Other Ambulatory Visit: Payer: Self-pay | Admitting: Internal Medicine

## 2020-09-04 ENCOUNTER — Telehealth: Payer: Self-pay | Admitting: Oncology

## 2020-09-04 ENCOUNTER — Other Ambulatory Visit: Payer: Self-pay | Admitting: Hematology and Oncology

## 2020-09-04 DIAGNOSIS — D539 Nutritional anemia, unspecified: Secondary | ICD-10-CM

## 2020-09-04 DIAGNOSIS — N179 Acute kidney failure, unspecified: Secondary | ICD-10-CM

## 2020-09-04 DIAGNOSIS — C549 Malignant neoplasm of corpus uteri, unspecified: Secondary | ICD-10-CM

## 2020-09-04 DIAGNOSIS — R1900 Intra-abdominal and pelvic swelling, mass and lump, unspecified site: Secondary | ICD-10-CM | POA: Diagnosis not present

## 2020-09-04 LAB — RENAL FUNCTION PANEL
Albumin: 2 g/dL — ABNORMAL LOW (ref 3.5–5.0)
Anion gap: 9 (ref 5–15)
BUN: 19 mg/dL (ref 8–23)
CO2: 19 mmol/L — ABNORMAL LOW (ref 22–32)
Calcium: 8.1 mg/dL — ABNORMAL LOW (ref 8.9–10.3)
Chloride: 102 mmol/L (ref 98–111)
Creatinine, Ser: 1.03 mg/dL — ABNORMAL HIGH (ref 0.44–1.00)
GFR, Estimated: 57 mL/min — ABNORMAL LOW (ref >60)
Glucose, Bld: 140 mg/dL — ABNORMAL HIGH (ref 70–99)
Phosphorus: 3.4 mg/dL (ref 2.5–4.6)
Potassium: 4.3 mmol/L (ref 3.5–5.1)
Sodium: 130 mmol/L — ABNORMAL LOW (ref 135–145)

## 2020-09-04 LAB — GLUCOSE, CAPILLARY
Glucose-Capillary: 148 mg/dL — ABNORMAL HIGH (ref 70–99)
Glucose-Capillary: 160 mg/dL — ABNORMAL HIGH (ref 70–99)
Glucose-Capillary: 166 mg/dL — ABNORMAL HIGH (ref 70–99)

## 2020-09-04 LAB — PREALBUMIN: Prealbumin: <5 mg/dL — ABNORMAL LOW (ref 18–38)

## 2020-09-04 MED ORDER — SODIUM CHLORIDE 0.9 % IV SOLN
150.0000 mg | Freq: Once | INTRAVENOUS | Status: AC
  Administered 2020-09-04: 150 mg via INTRAVENOUS
  Filled 2020-09-04: qty 5

## 2020-09-04 MED ORDER — FAMOTIDINE IN NACL 20-0.9 MG/50ML-% IV SOLN
20.0000 mg | Freq: Once | INTRAVENOUS | Status: AC
  Administered 2020-09-04: 20 mg via INTRAVENOUS
  Filled 2020-09-04: qty 50

## 2020-09-04 MED ORDER — SODIUM CHLORIDE 0.9% FLUSH: 10.0000 mL | INTRAVENOUS | Status: DC | PRN

## 2020-09-04 MED ORDER — SODIUM CHLORIDE 0.9 % IV SOLN
10.0000 mg | Freq: Once | INTRAVENOUS | Status: AC
  Administered 2020-09-04: 10 mg via INTRAVENOUS
  Filled 2020-09-04: qty 1

## 2020-09-04 MED ORDER — KATE FARMS STANDARD 1.4 PO LIQD: 325.0000 mL | Freq: Two times a day (BID) | ORAL | 0 refills | Status: AC

## 2020-09-04 MED ORDER — HEPARIN SOD (PORK) LOCK FLUSH 100 UNIT/ML IV SOLN: 500.0000 [IU] | Freq: Once | INTRAVENOUS | Status: DC | PRN

## 2020-09-04 MED ORDER — SODIUM CHLORIDE 0.9 % IV SOLN
175.0000 mg/m2 | Freq: Once | INTRAVENOUS | Status: AC
  Administered 2020-09-04: 258 mg via INTRAVENOUS
  Filled 2020-09-04: qty 43

## 2020-09-04 MED ORDER — SENNOSIDES-DOCUSATE SODIUM 8.6-50 MG PO TABS: 1.0000 | ORAL_TABLET | Freq: Two times a day (BID) | ORAL | 1 refills | Status: DC

## 2020-09-04 MED ORDER — SODIUM CHLORIDE 0.9% FLUSH: 3.0000 mL | INTRAVENOUS | Status: DC | PRN

## 2020-09-04 MED ORDER — HEPARIN SOD (PORK) LOCK FLUSH 100 UNIT/ML IV SOLN
500.0000 [IU] | Freq: Once | INTRAVENOUS | Status: AC
  Administered 2020-09-04: 500 [IU] via INTRAVENOUS
  Filled 2020-09-04: qty 5

## 2020-09-04 MED ORDER — SODIUM CHLORIDE 0.9 % IV SOLN
318.5000 mg | Freq: Once | INTRAVENOUS | Status: AC
  Administered 2020-09-04: 320 mg via INTRAVENOUS
  Filled 2020-09-04: qty 32

## 2020-09-04 MED ORDER — DIPHENHYDRAMINE HCL 50 MG/ML IJ SOLN
12.5000 mg | Freq: Once | INTRAMUSCULAR | Status: AC
  Administered 2020-09-04: 12.5 mg via INTRAVENOUS
  Filled 2020-09-04: qty 1

## 2020-09-04 MED ORDER — OXYBUTYNIN CHLORIDE 5 MG PO TABS: 5.0000 mg | ORAL_TABLET | Freq: Three times a day (TID) | ORAL | 1 refills | Status: DC | PRN

## 2020-09-04 MED ORDER — HEPARIN SOD (PORK) LOCK FLUSH 100 UNIT/ML IV SOLN: 250.0000 [IU] | Freq: Once | INTRAVENOUS | Status: DC | PRN

## 2020-09-04 MED ORDER — PALONOSETRON HCL INJECTION 0.25 MG/5ML
0.2500 mg | Freq: Once | INTRAVENOUS | Status: AC
  Administered 2020-09-04: 0.25 mg via INTRAVENOUS
  Filled 2020-09-04: qty 5

## 2020-09-04 MED ORDER — ALTEPLASE 2 MG IJ SOLR
2.0000 mg | Freq: Once | INTRAMUSCULAR | Status: DC | PRN
  Filled 2020-09-04: qty 2

## 2020-09-04 MED ORDER — SODIUM CHLORIDE 0.9 % IV SOLN: Freq: Once | INTRAVENOUS | Status: AC

## 2020-09-04 MED ORDER — POLYETHYLENE GLYCOL 3350 17 G PO PACK: 17.0000 g | PACK | Freq: Two times a day (BID) | ORAL | 0 refills | Status: DC | PRN

## 2020-09-04 NOTE — Care Management Important Message (Signed)
Important Message  Patient Details IM Letter given to the Patient. Name: Teresa Oliver MRN: [id number] Date of Birth: 07/30/1945   Medicare Important Message Given:  Yes     Kerin Salen 09/04/2020, 12:45 PM

## 2020-09-04 NOTE — Progress Notes (Signed)
Pt education on carboplatin and PAClitaxel given to pt. Printed handouts and ''my  Chemo journey " folder given to patient and family.

## 2020-09-04 NOTE — Evaluation (Addendum)
Physical Therapy Evaluation Patient Details Name: Teresa Oliver MRN: [id number] DOB: 14-Mar-1946 Today's Date: 09/04/2020   History of Present Illness  75 yo female admitted with pelvice mass, poorly differentiated carcinoma with mets. s/p biopsy 4/13 + 4/14. Hx of DM  Clinical Impression  On eval, pt was Min guard A for mobility. She walked ~200 feet around the unit. Dyspnea 2/4 with O2 sats >90% on RA-1 standing rest break needed after ~100. Pt denied lightheadedness/dizziness. She did not have any loss of balance but she did express feeling like legs were stiff from inactivity. Discussed d/c plan with pt and daughter-pt will return home where she lives with her husband. Son and daughter will check on/assist as needed. Pt politely declines PT f/u after discharge. Encouraged pt to mobilize (walk/exercise) as tolerated at home but to increase activity slowly.     Follow Up Recommendations No PT follow up: Intermittent Supervision/Assist    Equipment Recommendations  None recommended by PT    Recommendations for Other Services       Precautions / Restrictions Precautions Precautions: Fall Restrictions Weight Bearing Restrictions: No      Mobility  Bed Mobility Overal bed mobility: Modified Independent                  Transfers Overall transfer level: Modified independent                  Ambulation/Gait Ambulation/Gait assistance: Min guard Gait Distance (Feet): 200 Feet Assistive device: None Gait Pattern/deviations: Step-through pattern;Decreased stride length     General Gait Details: Dyspnea 2/4. O2 >90% on RA, HR 115 bpm. 1 brief standing rest break after ~100 feet. No c/o lightheadedness/dizziness. Pt reported LEs felt stiff from decreased activity. No LOB  Stairs            Wheelchair Mobility    Modified Rankin (Stroke Patients Only)       Balance Overall balance assessment: Mild deficits observed, not formally tested                                            Pertinent Vitals/Pain Pain Assessment: No/denies pain    Home Living Family/patient expects to be discharged to:: Private residence Living Arrangements: Spouse/significant other Available Help at Discharge: Family Type of Home: House Home Access: Stairs to enter   Technical brewer of Steps: 3 Home Layout: One level Home Equipment: None      Prior Function Level of Independence: Independent         Comments: was very active (taking fitness classes at [employer]) up until ~3 weeks PTA     Hand Dominance        Extremity/Trunk Assessment   Upper Extremity Assessment Upper Extremity Assessment: Overall WFL for tasks assessed    Lower Extremity Assessment Lower Extremity Assessment: Generalized weakness    Cervical / Trunk Assessment Cervical / Trunk Assessment: Normal  Communication   Communication: No difficulties  Cognition Arousal/Alertness: Awake/alert Behavior During Therapy: WFL for tasks assessed/performed Overall Cognitive Status: Within Functional Limits for tasks assessed                                        General Comments      Exercises     Assessment/Plan  PT Assessment Patient needs continued PT services  PT Problem List Decreased strength;Decreased mobility;Decreased activity tolerance       PT Treatment Interventions Gait training;Therapeutic exercise;Balance training;Functional mobility training;Therapeutic activities;Patient/family education    PT Goals (Current goals can be found in the Care Plan section)  Acute Rehab PT Goals Patient Stated Goal: home. regain PLOF PT Goal Formulation: With patient Time For Goal Achievement: 09/18/20 Potential to Achieve Goals: Good    Frequency Min 3X/week   Barriers to discharge        Co-evaluation               AM-PAC PT "6 Clicks" Mobility  Outcome Measure Help needed turning from your back to your side  while in a flat bed without using bedrails?: None Help needed moving from lying on your back to sitting on the side of a flat bed without using bedrails?: None Help needed moving to and from a bed to a chair (including a wheelchair)?: A Little Help needed standing up from a chair using your arms (e.g., wheelchair or bedside chair)?: A Little Help needed to walk in hospital room?: A Little Help needed climbing 3-5 steps with a railing? : A Little 6 Click Score: 20    End of Session   Activity Tolerance: Patient tolerated treatment well Patient left: in bed;with call bell/phone within reach;with family/visitor present   PT Visit Diagnosis: Muscle weakness (generalized) (M62.81)    Time: 0626-9485 PT Time Calculation (min) (ACUTE ONLY): 13 min   Charges:   PT Evaluation $PT Eval Low Complexity: [place], PT Acute Rehabilitation  Office: [phone] Pager: [phone]

## 2020-09-04 NOTE — Telephone Encounter (Signed)
Called Teresa Oliver and notified her of appointments for lab/flush, follow up with Dr. Alvy Bimler and patient education class on 09/11/2020.  She verbalized understanding and agreement.

## 2020-09-04 NOTE — Progress Notes (Signed)
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 The intent is of curative intent.  We discussed some of the risks, benefits, side-effects of carboplatin & Taxol. Treatment is intravenous, every 3 weeks followed by repeat imaging study and we will determine whether she would be a surgical candidate beyond that  Some of the short term side-effects included, though not limited to, including weight loss, life threatening infections, risk of allergic reactions, need for transfusions of blood products, nausea, vomiting, change in bowel habits, loss of hair, admission to hospital for various reasons, and risks of death.   Long term side-effects are also discussed including risks of infertility, permanent damage to nerve function, hearing loss, chronic fatigue, kidney damage with possibility needing hemodialysis, and rare secondary malignancy including bone marrow disorders.  The patient is aware that the response rates discussed earlier is not guaranteed.  After a long discussion, patient made an informed decision to proceed with the prescribed plan of care.  We discussed premedication with dexamethasone before chemotherapy.   After chemotherapy today, she can be discharged I will set up outpatient follow-up next week  Mild left hydroureteronephrosis with AKI, stable Renal function overall stable Recommend nephrostomy tube if worsening  renal function I will adjust the dose of her chemotherapy based on her most recent renal function  Microcytic anemia, thalassemia Hemoglobin has been between 8-9 Iron studies are adequate Further discussion with the patient reveals that she is of [place] descent and has been diagnosed with thalassemia many years ago  She has never required blood transfusions and as such I suspect this is thalassemia minor We will recheck CBC next week  Hyponatremia, improved Hyponatremia improved after receiving IV fluids May have a component of SIADH secondary to underlying malignancy Monitor  Loose stool Could be due to recent laxative Monitor carefully for now  Discharge planning She can be discharged home after today I will set up outpatient follow-up next week  All questions were answered. The patient knows to call the clinic with any problems, questions or concerns.   [name], MD 09/04/2020 8:00 AM  Subjective:  She did not have a good weekend She hated the food in the hospital She has some mild GI discomfort due to frequent stool  Objective:  Vitals:   09/03/20 2146 09/04/20 0538  BP: 139/87 125/83  Pulse: (!) 110 100  Resp: 16 16  Temp: 99.2 F (37.3 C) 97.8 F (36.6 C)  SpO2: 97% 96%     Intake/Output Summary (Last 24 hours) at 09/04/2020 0800 Last data filed at 09/03/2020 2225 Gross per 24 hour  Intake 180 ml  Output --  Net 180 ml    GENERAL:alert, no distress and comfortable SKIN: skin color, texture, turgor are normal, no rashes or significant lesions EYES: normal, Conjunctiva are pink and non-injected, sclera clear OROPHARYNX:no exudate, no erythema and lips, buccal mucosa, and tongue normal  NECK: supple, thyroid normal size, non-tender, without nodularity LYMPH:  no palpable lymphadenopathy in the cervical, axillary or inguinal  LUNGS: clear to auscultation and percussion with normal breathing effort HEART: regular rate & rhythm and no murmurs and no lower  extremity edema ABDOMEN:abdomen soft, non-tender and normal bowel sounds Musculoskeletal:no cyanosis of digits and no clubbing  NEURO: alert & oriented x 3 with fluent speech, no focal motor/sensory deficits   Labs:  Recent Labs    08/28/20 1648 08/28/20 2138 08/29/20 1307 08/30/20 0653 08/31/20 0533 09/01/20 0519 09/02/20 0653 09/04/20 0358  NA 117*   < > 120* 118*   < > 125* 129*  128* 130*  K 2.6*   < > 4.2 4.0   < > 3.9 3.6  3.6 4.3  CL 80*   < > 86* 86*   < > 96* 100  99 102  CO2 21*   < > 21* 21*   < > 19* 20*  20* 19*  GLUCOSE 135*   < > 136* 122*   < > 99 121*  122* 140*  BUN 18   < > 15 14   < > 10 15  16 19   CREATININE 1.22*   < > 1.20* 1.15*   < > 1.17* 1.13*  1.02* 1.03*  CALCIUM 8.8*   < > 8.8* 8.5*   < > 8.5* 8.2*  8.1* 8.1*  GFRNONAA 47*   < > 48* 50*   < > 49* 51*  58* 57*  PROT 6.9  --  7.0 6.7  --   --   --   --   ALBUMIN 3.6  --  3.0* 2.7*  --   --  2.2* 2.0*  AST 20  --  21 24  --   --   --   --   ALT 17  --  19 18  --   --   --   --   ALKPHOS 85  --  94 87  --   --   --   --   BILITOT 0.5  --  0.5 0.8  --   --   --   --    < > = values in this interval not displayed.    Studies:  CT ABDOMEN PELVIS WO CONTRAST  Result Date: 08/28/2020 CLINICAL DATA:  Abdominal pain, acute, nonlocalized EXAM: CT ABDOMEN AND PELVIS WITHOUT CONTRAST TECHNIQUE: Multidetector CT imaging of the abdomen and pelvis was performed following the standard protocol without IV contrast. COMPARISON:  Radiograph 08/25/2020 FINDINGS: Lower chest: Lung bases are clear. No focal consolidation or pleural fluid. Small hiatal hernia. Hepatobiliary: No focal liver abnormality is seen. No gallstones, gallbladder wall thickening, or biliary dilatation. Pancreas: No ductal dilatation or inflammation. Spleen: Normal in size without focal abnormality. Adrenals/Urinary Tract: No adrenal nodule. Minimal left hydronephrosis. Left ureter is dilated to the pelvis. No renal or ureteral stone. No  significant perinephric edema. No right hydronephrosis. No right renal stone. Decompressed right ureter. Urinary bladder is essentially empty and not well assessed. Stomach/Bowel: Bowel evaluation is limited in the absence of enteric contrast. There is wall thickening of the sigmoid colon with mild pericolonic edema. The mid sigmoid colon appears apposed to the uterus with obliteration of normal fat plane. Sigmoid colon is also difficult to delineate from a pelvic mass, which is also contiguous with the uterus. Please note that detailed assessment is limited on this noncontrast exam. There is a moderate to large volume of stool in the more proximal colon. No definite small bowel obstruction or inflammatory change. Appendix is normal. Small to moderate hiatal  hernia. Vascular/Lymphatic: Multifocal adenopathy. Please note that detailed assessment of adenopathy is limited on this noncontrast exam. For instance, left periaortic node measures 16 mm short axis, series 2, image 30. Additional prominent and mildly enlarged lower retroperitoneal lymph nodes are seen. Right upper quadrant adenopathy with lymph nodes measuring 11 in 15 mm, series 2, image 36, likely in the mesentery. Right lower quadrant mesenteric node measures 13 mm, series 2, image 44. There additional enlarged ileocolic lymph nodes. 9 mm right external iliac node. Rounded soft tissue mass about the left pelvic sidewall measures 2.3 x 3.3 cm, may be adnexal mass or iliac adenopathy. There is a 17 mm left external iliac node, series 2, image 58. Prominent bilateral inguinal nodes. Reproductive: Pelvic mass just to the left of midline measures 7.5 x 4.8 cm, and is contiguous with the sigmoid colon and uterus. The ovary is not definitively visualized. Right ovary is not definitively seen. Other: Mild fat stranding in the pelvis as well as presacral region. Minimal omental edema without obvious omental thickening or mass. No ascites. No free air.  Musculoskeletal: No acute osseous abnormality or focal bone lesion. No subcutaneous abnormality. IMPRESSION: 1. Pelvic mass measuring 7.5 x 4.8 cm, contiguous with the sigmoid colon and uterus with loss of fat planes. Site of origin is unclear, may be ovarian, uterine, or colonic. Lack of contrast limits detailed assessment. 2. Above pelvic mass causes partial obstruction of the left ureter with mild left hydroureteronephrosis. 3. Multifocal adenopathy in the abdomen and pelvis, suspicious for metastatic disease, primarily in the mesentery and lower peritoneum and pelvis. Please note that detailed assessment of adenopathy is limited on this noncontrast exam. Recommend oncology referral. 4. Wall thickening of the sigmoid colon distal to the left pelvic mass with pericolonic edema, suspicious for colitis. There is a moderate to large amount of stool in the more proximal colon, suggesting pelvic mass may be causing delayed colonic transit. 5. Small to moderate hiatal hernia. Electronically Signed   By: Keith Rake M.D.   On: 08/28/2020 18:30   CT BIOPSY  Result Date: 08/30/2020 INDICATION: 75 year old woman with uterine mass and pelvic lymphadenopathy presents to interventional radiology for biopsy of left external iliac chain lymph node. EXAM: CT-guided biopsy of left external iliac chain lymph node. MEDICATIONS: None. ANESTHESIA/SEDATION: Moderate (conscious) sedation was employed during this procedure. A total of Versed 2 mg and Fentanyl 50 mcg was administered intravenously. Moderate Sedation Time: 10 minutes. The patient's level of consciousness and vital signs were monitored continuously by radiology nursing throughout the procedure under my direct supervision. COMPLICATIONS: None immediate. PROCEDURE: Informed written consent was obtained from the patient after a thorough discussion of the procedural risks, benefits and alternatives. All questions were addressed. Maximal Sterile Barrier Technique was  utilized including caps, mask, sterile gowns, sterile gloves, sterile drape, hand hygiene and skin antiseptic. A timeout was performed prior to the initiation of the procedure. Patient position supine on the CT table. Left inguinal skin prepped and draped in usual sterile fashion. Following local lidocaine administration, 17 gauge introducer needle was advanced into the enlarged left external iliac chain lymph node utilizing CT guidance. Three cores were obtained from the enlarged left external iliac chain lymph node and sent to pathology in formalin. Needle removed and hemostasis achieved with manual compression. IMPRESSION: CT-guided biopsy of enlarged left external iliac chain lymph node as above. Electronically Signed   By: Miachel Roux M.D.   On: 08/30/2020 13:17   DG Abd 2 Views  Result  Date: 08/27/2020 CLINICAL DATA:  Change in bowel habits EXAM: ABDOMEN - 2 VIEW COMPARISON:  None. FINDINGS: Normal abdominal gas pattern. Moderate stool throughout the colon. No free intraperitoneal gas. No organomegaly. No abnormal calcifications within the abdomen and pelvis. No acute bone abnormality. IMPRESSION: Normal abdominal gas pattern.  Moderate stool. Electronically Signed   By: Fidela Salisbury MD   On: 08/27/2020 23:48   IR IMAGING GUIDED PORT INSERTION  Result Date: 09/01/2020 INDICATION: 75 year old female with pelvic malignancy requiring central venous access for chemotherapy. EXAM: IMPLANTED PORT A CATH PLACEMENT WITH ULTRASOUND AND FLUOROSCOPIC GUIDANCE COMPARISON:  None. MEDICATIONS: None. ANESTHESIA/SEDATION: Moderate (conscious) sedation was employed during this procedure. A total of Versed 2 mg and Fentanyl 100 mcg was administered intravenously. Moderate Sedation Time: 15 minutes. The patient's level of consciousness and vital signs were monitored continuously by radiology nursing throughout the procedure under my direct supervision. CONTRAST:  None FLUOROSCOPY TIME:  0 minutes, 6 seconds (1 mGy)  COMPLICATIONS: None immediate. PROCEDURE: The procedure, risks, benefits, and alternatives were explained to the patient. Questions regarding the procedure were encouraged and answered. The patient understands and consents to the procedure. The right neck and chest were prepped with chlorhexidine in a sterile fashion, and a sterile drape was applied covering the operative field. Maximum barrier sterile technique with sterile gowns and gloves were used for the procedure. A timeout was performed prior to the initiation of the procedure. Ultrasound was used to examine the jugular vein which was compressible and free of internal echoes. A skin marker was used to demarcate the planned venotomy and port pocket incision sites. Local anesthesia was provided to these sites and the subcutaneous tunnel track with 1% lidocaine with 1:100,000 epinephrine. A small incision was created at the jugular access site and blunt dissection was performed of the subcutaneous tissues. Under ultrasound guidance, the jugular vein was accessed with a 21 ga micropuncture needle and an 0.018" wire was inserted to the superior vena cava. Real-time ultrasound guidance was utilized for vascular access including the acquisition of a permanent ultrasound image documenting patency of the accessed vessel. A 5 Fr micopuncture set was then used, through which a 0.035" Rosen wire was passed under fluoroscopic guidance into the inferior vena cava. An 8 Fr dilator was then placed over the wire. A subcutaneous port pocket was then created along the upper chest wall utilizing a combination of sharp and blunt dissection. The pocket was irrigated with sterile saline, packed with gauze, and observed for hemorrhage. A single lumen "ISP" sized power injectable port was chosen for placement. The 8 Fr catheter was tunneled from the port pocket site to the venotomy incision. The port was placed in the pocket. The external catheter was trimmed to appropriate length.  The dilator was exchanged for an 8 Fr peel-away sheath under fluoroscopic guidance. The catheter was then placed through the sheath and the sheath was removed. Final catheter positioning was confirmed and documented with a fluoroscopic spot radiograph. The port was accessed with a Huber needle, aspirated, and flushed with heparinized saline. The deep dermal layer of the port pocket incision was closed with interrupted 3-0 Vicryl suture. Dermabond was then placed over the port pocket and neck incisions. The patient tolerated the procedure well without immediate post procedural complication. FINDINGS: After catheter placement, the tip lies within the superior cavoatrial junction. The catheter aspirates and flushes normally and is ready for immediate use. IMPRESSION: Successful placement of a power injectable Port-A-Cath via the right internal jugular vein.  The catheter is ready for immediate use. Ruthann Cancer, MD [facility] Electronically Signed   By: Ruthann Cancer MD   On: 09/01/2020 11:35

## 2020-09-04 NOTE — Progress Notes (Signed)
Calculation and dosage of carboplatin and paclitaxal  Verified by another chemo certified nurse, Rick Duff

## 2020-09-04 NOTE — Progress Notes (Signed)
Ok to treat with hgb on 4.16 of 7.2 per MD

## 2020-09-04 NOTE — Discharge Summary (Incomplete)
Physician Discharge Summary  Patient ID: Teresa Oliver MRN: [id number] DOB/AGE: 1945/08/30 75 y.o.  Admit date: 08/28/2020 Discharge date: 09/04/2020  Admission Diagnoses:  Discharge Diagnoses:  Principal Problem:   Pelvic mass Active Problems:   Hydroureteronephrosis   Hypokalemia   Hypomagnesemia   Hyponatremia   Hyperglycemia   Constipation   AKI (acute kidney injury) ([place])   Malnutrition of moderate degree   Abdominal pain, generalized   Discharged Condition: {condition:18240}  Hospital Course: ***  Consults: {consultation:18241}  Significant Diagnostic Studies: {diagnostics:18242}  Treatments: {Tx:18249}  Discharge Exam: Blood pressure 131/85, pulse 100, temperature 99.1 F (37.3 C), temperature source Oral, resp. rate 16, height 5' (1.524 m), weight 51.1 kg, SpO2 99 %. {physical NGEX:5284132}  Disposition: Discharge disposition: 01-Home or Self Care       Discharge Instructions    Diet - low sodium heart healthy   Complete by: As directed    Discharge wound care:   Complete by: As directed    Continue current wound care   Increase activity slowly   Complete by: As directed    TREATMENT CONDITIONS   Complete by: As directed    Patient should have CBC & CMP within 7 days prior to chemotherapy administration. NOTIFY MD IF: ANC < 1500, Hemoglobin < 8, PLT < 100,000,  Total Bili > 1.5, Creatinine > 1.5, ALT & AST > 80 or if patient has unstable vital signs: Temperature > 38.5, SBP > 180 or < 90, RR > 30 or HR > 100.     Allergies as of 09/04/2020   No Known Allergies     Medication List    STOP taking these medications   losartan-hydrochlorothiazide 100-25 MG tablet Commonly known as: HYZAAR   pantoprazole 40 MG tablet Commonly known as: PROTONIX   pravastatin 80 MG tablet Commonly known as: PRAVACHOL     TAKE these medications   cholecalciferol 25 MCG (1000 UNIT) tablet Commonly known as: VITAMIN D3 Take 1,000 Units by mouth daily.    feeding supplement (KATE FARMS STANDARD 1.4) Liqd liquid Take 325 mLs by mouth 2 (two) times daily between meals.   IRON PO Take 1 tablet by mouth daily.   oxybutynin 5 MG tablet Commonly known as: DITROPAN Take 1 tablet (5 mg total) by mouth every 8 (eight) hours as needed for bladder spasms.   polyethylene glycol 17 g packet Commonly known as: MIRALAX / GLYCOLAX Take 17 g by mouth 2 (two) times daily as needed for moderate constipation.   senna-docusate 8.6-50 MG tablet Commonly known as: Senokot-S Take 1 tablet by mouth 2 (two) times daily.            Discharge Care Instructions  (From admission, onward)         Start     Ordered   09/04/20 0000  Discharge wound care:       Comments: Continue current wound care   09/04/20 1614           Signed: Bonnell Public 09/04/2020, 4:14 PM

## 2020-09-04 NOTE — Evaluation (Signed)
Occupational Therapy Evaluation Patient Details Name: KIMBELY WHITEAKER MRN: [id number] DOB: 05/23/1945 Today's Date: 09/04/2020    History of Present Illness 75 yo female admitted with pelvic mass, poorly differentiated carcinoma with mets. s/p biopsy 4/13 + 4/14. Hx of DM   Clinical Impression   Pt is functioning modified independently. She was noted to fatigue easily. Educated in energy conservation strategies and provided written handout. No further OT needs.     Follow Up Recommendations  No OT follow up    Equipment Recommendations  None recommended by OT    Recommendations for Other Services       Precautions / Restrictions Precautions Precautions: Fall Restrictions Weight Bearing Restrictions: No      Mobility Bed Mobility Overal bed mobility: Modified Independent             General bed mobility comments: HOB up    Transfers Overall transfer level: Modified independent Equipment used: None                  Balance Overall balance assessment: Mild deficits observed, not formally tested                                         ADL either performed or assessed with clinical judgement   ADL Overall ADL's : Modified independent                                       General ADL Comments: fatigues easily, educated in energy conservation     Vision Baseline Vision/History: Wears glasses Wears Glasses: Reading only Patient Visual Report: No change from baseline       Perception     Praxis      Pertinent Vitals/Pain Pain Assessment: No/denies pain     Hand Dominance Right   Extremity/Trunk Assessment Upper Extremity Assessment Upper Extremity Assessment: Overall WFL for tasks assessed   Lower Extremity Assessment Lower Extremity Assessment: Overall WFL for tasks assessed   Cervical / Trunk Assessment Cervical / Trunk Assessment: Normal   Communication Communication Communication: No difficulties    Cognition Arousal/Alertness: Awake/alert Behavior During Therapy: WFL for tasks assessed/performed Overall Cognitive Status: Within Functional Limits for tasks assessed                                     General Comments       Exercises     Shoulder Instructions      Home Living Family/patient expects to be discharged to:: Private residence Living Arrangements: Spouse/significant other Available Help at Discharge: Family Type of Home: House Home Access: Stairs to enter Technical brewer of Steps: 3   Home Layout: One level       Home Equipment: None          Prior Functioning/Environment Level of Independence: Independent        Comments: was very active (taking fitness classes at [employer]) up until ~3 weeks PTA        OT Problem List:        OT Treatment/Interventions:      OT Goals(Current goals can be found in the care plan section) Acute Rehab OT Goals Patient Stated Goal: home. regain PLOF  OT Frequency:  Barriers to D/C:            Co-evaluation              AM-PAC OT "6 Clicks" Daily Activity     Outcome Measure Help from another person eating meals?: None Help from another person taking care of personal grooming?: None Help from another person toileting, which includes using toliet, bedpan, or urinal?: None Help from another person bathing (including washing, rinsing, drying)?: None Help from another person to put on and taking off regular upper body clothing?: None Help from another person to put on and taking off regular lower body clothing?: None 6 Click Score: 24   End of Session    Activity Tolerance: Patient tolerated treatment well Patient left: in bed;with call bell/phone within reach  OT Visit Diagnosis: Muscle weakness (generalized) (M62.81)                Time: 1445-1500 OT Time Calculation (min): 15 min Charges:  OT General Charges $OT Visit: 1 Visit OT Evaluation $OT Eval Low Complexity:  1 Low  Nestor Lewandowsky, OTR/L Acute Rehabilitation Services Pager: [phone] Office: [phone]  Malka So 09/04/2020, 3:10 PM

## 2020-09-05 ENCOUNTER — Telehealth: Payer: Self-pay

## 2020-09-05 ENCOUNTER — Inpatient Hospital Stay: Admission: RE | Admit: 2020-09-05 | Payer: Medicare Other | Source: Ambulatory Visit

## 2020-09-05 NOTE — Telephone Encounter (Signed)
-----   Message from [name], MD sent at 09/05/2020  2:28 PM EDT ----- She got first dose chemo yesterday and was DC after chemo Can you call to check on her?

## 2020-09-05 NOTE — Telephone Encounter (Signed)
Called and given below message. She verbalized understanding. She is doing well. She is taking the Ditropan prn for bladder spasms. She is questioning what she can take for pain, instructed to take tylenol prn if needed. Denies constipation and eating with no complaints. She is having problems sleeping and will try Melatonin tonight. Ask her to call the office back for questions.

## 2020-09-07 ENCOUNTER — Telehealth: Payer: Self-pay

## 2020-09-07 ENCOUNTER — Other Ambulatory Visit: Payer: Self-pay | Admitting: Hematology and Oncology

## 2020-09-07 ENCOUNTER — Encounter: Payer: Self-pay | Admitting: Hematology and Oncology

## 2020-09-07 ENCOUNTER — Encounter: Payer: Self-pay | Admitting: Oncology

## 2020-09-07 ENCOUNTER — Other Ambulatory Visit: Payer: Self-pay

## 2020-09-07 DIAGNOSIS — N183 Chronic kidney disease, stage 3 unspecified: Secondary | ICD-10-CM | POA: Insufficient documentation

## 2020-09-07 NOTE — Telephone Encounter (Signed)
She called and left a message to call her.  Called back. She is crying and upset. She did not sleep last night due to loose stools. She is having multiple loose stools a day. More than she can count she said. X 2 this morning and she thinks that it is better. She started having loose bm's Tuesday night, but she is not sure when it started. She stopped the Miralax and Senokot last night because she was not sure what to do. She is able to eat and drink with no problems. She is having incontinent stools at times because she cannot get to the bathroom in time.  Instructed stop the senokot and Miralax for now. Start drinking Educational psychologist. She verbalized understanding.

## 2020-09-07 NOTE — Telephone Encounter (Signed)
Called back and reviewed upcoming appts. Ask her to call the office back if needed. She verbalized understanding.

## 2020-09-07 NOTE — Progress Notes (Signed)
Requested MSI/MMR and Her2 testing on accession [id number] with [facility] via email.

## 2020-09-08 LAB — SURGICAL PATHOLOGY

## 2020-09-11 ENCOUNTER — Inpatient Hospital Stay: Payer: Medicare Other

## 2020-09-11 ENCOUNTER — Other Ambulatory Visit: Payer: Self-pay

## 2020-09-11 ENCOUNTER — Encounter: Payer: Self-pay | Admitting: Hematology and Oncology

## 2020-09-11 ENCOUNTER — Inpatient Hospital Stay: Payer: Medicare Other | Attending: Hematology and Oncology | Admitting: Hematology and Oncology

## 2020-09-11 ENCOUNTER — Telehealth: Payer: Self-pay

## 2020-09-11 VITALS — BP 172/84 | HR 106 | Temp 98.2°F | Resp 18 | Ht 60.0 in | Wt 119.0 lb

## 2020-09-11 DIAGNOSIS — C775 Secondary and unspecified malignant neoplasm of intrapelvic lymph nodes: Secondary | ICD-10-CM | POA: Diagnosis not present

## 2020-09-11 DIAGNOSIS — C549 Malignant neoplasm of corpus uteri, unspecified: Secondary | ICD-10-CM | POA: Insufficient documentation

## 2020-09-11 DIAGNOSIS — N179 Acute kidney failure, unspecified: Secondary | ICD-10-CM

## 2020-09-11 DIAGNOSIS — D539 Nutritional anemia, unspecified: Secondary | ICD-10-CM | POA: Diagnosis not present

## 2020-09-11 DIAGNOSIS — E440 Moderate protein-calorie malnutrition: Secondary | ICD-10-CM | POA: Insufficient documentation

## 2020-09-11 DIAGNOSIS — Z79899 Other long term (current) drug therapy: Secondary | ICD-10-CM | POA: Insufficient documentation

## 2020-09-11 DIAGNOSIS — N183 Chronic kidney disease, stage 3 unspecified: Secondary | ICD-10-CM

## 2020-09-11 DIAGNOSIS — F411 Generalized anxiety disorder: Secondary | ICD-10-CM | POA: Insufficient documentation

## 2020-09-11 LAB — CBC WITH DIFFERENTIAL/PLATELET
Abs Immature Granulocytes: 0.05 10*3/uL (ref 0.00–0.07)
Basophils Absolute: 0 10*3/uL (ref 0.0–0.1)
Basophils Relative: 0 %
Eosinophils Absolute: 0.2 10*3/uL (ref 0.0–0.5)
Eosinophils Relative: 6 %
HCT: 21.2 % — ABNORMAL LOW (ref 36.0–46.0)
Hemoglobin: 6.8 g/dL — CL (ref 12.0–15.0)
Immature Granulocytes: 2 %
Lymphocytes Relative: 11 %
Lymphs Abs: 0.4 10*3/uL — ABNORMAL LOW (ref 0.7–4.0)
MCH: 20.9 pg — ABNORMAL LOW (ref 26.0–34.0)
MCHC: 32.1 g/dL (ref 30.0–36.0)
MCV: 65 fL — ABNORMAL LOW (ref 80.0–100.0)
Monocytes Absolute: 0.3 10*3/uL (ref 0.1–1.0)
Monocytes Relative: 9 %
Neutro Abs: 2.5 10*3/uL (ref 1.7–7.7)
Neutrophils Relative %: 72 %
Platelets: 290 10*3/uL (ref 150–400)
RBC: 3.26 MIL/uL — ABNORMAL LOW (ref 3.87–5.11)
RDW: 14.1 % (ref 11.5–15.5)
WBC: 3.4 10*3/uL — ABNORMAL LOW (ref 4.0–10.5)
nRBC: 0 % (ref 0.0–0.2)

## 2020-09-11 LAB — COMPREHENSIVE METABOLIC PANEL
ALT: 27 U/L (ref 0–44)
AST: 41 U/L (ref 15–41)
Albumin: 2 g/dL — ABNORMAL LOW (ref 3.5–5.0)
Alkaline Phosphatase: 128 U/L — ABNORMAL HIGH (ref 38–126)
Anion gap: 11 (ref 5–15)
BUN: 17 mg/dL (ref 8–23)
CO2: 24 mmol/L (ref 22–32)
Calcium: 7.7 mg/dL — ABNORMAL LOW (ref 8.9–10.3)
Chloride: 100 mmol/L (ref 98–111)
Creatinine, Ser: 0.78 mg/dL (ref 0.44–1.00)
GFR, Estimated: >60 mL/min (ref >60)
Glucose, Bld: 94 mg/dL (ref 70–99)
Potassium: 3.5 mmol/L (ref 3.5–5.1)
Sodium: 135 mmol/L (ref 135–145)
Total Bilirubin: 0.3 mg/dL (ref 0.3–1.2)
Total Protein: 5.4 g/dL — ABNORMAL LOW (ref 6.5–8.1)

## 2020-09-11 LAB — SAMPLE TO BLOOD BANK

## 2020-09-11 LAB — PREPARE RBC (CROSSMATCH)

## 2020-09-11 LAB — ABO/RH: ABO/RH(D): A POS

## 2020-09-11 LAB — MAGNESIUM: Magnesium: 1.1 mg/dL — ABNORMAL LOW (ref 1.7–2.4)

## 2020-09-11 MED ORDER — ACETAMINOPHEN 325 MG PO TABS
ORAL_TABLET | ORAL | Status: AC
  Filled 2020-09-11: qty 2

## 2020-09-11 MED ORDER — ACETAMINOPHEN 325 MG PO TABS
650.0000 mg | ORAL_TABLET | Freq: Once | ORAL | Status: AC
  Administered 2020-09-11: 650 mg via ORAL

## 2020-09-11 MED ORDER — LORAZEPAM 0.5 MG PO TABS: 0.5000 mg | ORAL_TABLET | Freq: Two times a day (BID) | ORAL | 0 refills | Status: DC | PRN

## 2020-09-11 MED ORDER — SODIUM CHLORIDE 0.9% FLUSH
10.0000 mL | Freq: Once | INTRAVENOUS | Status: AC
Start: 2020-09-11 — End: 2020-09-11
  Administered 2020-09-11: 10 mL via INTRAVENOUS
  Filled 2020-09-11: qty 10

## 2020-09-11 MED ORDER — ONDANSETRON HCL 8 MG PO TABS: 8.0000 mg | ORAL_TABLET | Freq: Three times a day (TID) | ORAL | 3 refills | Status: DC | PRN

## 2020-09-11 MED ORDER — SODIUM CHLORIDE 0.9% IV SOLUTION
250.0000 mL | Freq: Once | INTRAVENOUS | Status: AC
  Administered 2020-09-11: 250 mL via INTRAVENOUS
  Filled 2020-09-11: qty 250

## 2020-09-11 MED ORDER — PROCHLORPERAZINE MALEATE 10 MG PO TABS: 10.0000 mg | ORAL_TABLET | Freq: Four times a day (QID) | ORAL | 1 refills | Status: AC | PRN

## 2020-09-11 MED ORDER — DIPHENHYDRAMINE HCL 25 MG PO CAPS
ORAL_CAPSULE | ORAL | Status: AC
  Filled 2020-09-11: qty 1

## 2020-09-11 MED ORDER — LIDOCAINE-PRILOCAINE 2.5-2.5 % EX CREA: 1.0000 "application " | TOPICAL_CREAM | Freq: Every day | CUTANEOUS | 3 refills | Status: AC | PRN

## 2020-09-11 MED ORDER — SODIUM CHLORIDE 0.9% FLUSH
10.0000 mL | Freq: Once | INTRAVENOUS | Status: AC
  Administered 2020-09-11: 10 mL
  Filled 2020-09-11: qty 10

## 2020-09-11 MED ORDER — DIPHENHYDRAMINE HCL 25 MG PO CAPS
25.0000 mg | ORAL_CAPSULE | Freq: Once | ORAL | Status: AC
  Administered 2020-09-11: 25 mg via ORAL

## 2020-09-11 MED ORDER — SODIUM CHLORIDE 0.9% FLUSH
10.0000 mL | INTRAVENOUS | Status: AC | PRN
  Administered 2020-09-11: 10 mL
  Filled 2020-09-11: qty 10

## 2020-09-11 MED ORDER — HEPARIN SOD (PORK) LOCK FLUSH 100 UNIT/ML IV SOLN
500.0000 [IU] | Freq: Once | INTRAVENOUS | Status: AC
  Administered 2020-09-11: 500 [IU]
  Filled 2020-09-11: qty 5

## 2020-09-11 MED ORDER — HEPARIN SOD (PORK) LOCK FLUSH 100 UNIT/ML IV SOLN
500.0000 [IU] | Freq: Every day | INTRAVENOUS | Status: AC | PRN
  Administered 2020-09-11: 500 [IU]
  Filled 2020-09-11: qty 5

## 2020-09-11 MED ORDER — HEPARIN SOD (PORK) LOCK FLUSH 100 UNIT/ML IV SOLN
500.0000 [IU] | Freq: Once | INTRAVENOUS | Status: AC
  Administered 2020-09-11: 500 [IU] via INTRAVENOUS
  Filled 2020-09-11: qty 5

## 2020-09-11 NOTE — Progress Notes (Signed)
CRITICAL VALUE STICKER  CRITICAL VALUE: Hemoglobin - 6.8  RECEIVER: [place] NOTIFIED: 8250  09/11/2020  MESSENGER (representative from lab): Malachy Chamber  MD NOTIFIED: Dr. Alvy Bimler  TIME OF NOTIFICATION: 0370  RESPONSE: No orders given

## 2020-09-11 NOTE — Assessment & Plan Note (Signed)
She appears anxious The blood pressure is elevated likely due to this I recommend gentle dose of lorazepam as needed I warned about risk of sedation

## 2020-09-11 NOTE — Progress Notes (Signed)
[place] OFFICE PROGRESS NOTE  Patient Care Team: Jani Gravel, MD as PCP - General (Internal Medicine)  ASSESSMENT & PLAN:  Uterine cancer [facility]) She has completed cycle 1 of treatment, complicated by severe anemia and diarrhea Continue aggressive supportive care Overall, I felt that she is responding well to treatment I will see her prior to cycle 2 of treatment Recommend minimum 3 cycles of therapy before repeat imaging study  Deficiency anemia She has multifactorial anemia, combination of thalassemia and anemia of chronic illness secondary to recent chemo We discussed some of the risks, benefits, and alternatives of blood transfusions. The patient is symptomatic from anemia and the hemoglobin level is critically low.  Some of the side-effects to be expected including risks of transfusion reactions, chills, infection, syndrome of volume overload and risk of hospitalization from various reasons and the patient is willing to proceed and went ahead to sign consent today. We will order 1 unit of blood today  Malnutrition of moderate degree Her appetite has improved She will continue frequent small meals I recommend the patient to start with easy digestible food and high-protein intake if all possible  CKD (chronic kidney disease), stage III (HCC) Renal failure has resolved Observe closely  Anxiety, generalized She appears anxious The blood pressure is elevated likely due to this I recommend gentle dose of lorazepam as needed I warned about risk of sedation   Orders Placed This Encounter  Procedures  . Informed Consent Details: Physician/Practitioner Attestation; Transcribe to consent form and obtain patient signature    Standing Status:   Future    Standing Expiration Date:   09/11/2021    Order Specific Question:   Physician/Practitioner attestation of informed consent for blood and or blood product transfusion    Answer:   I, the physician/practitioner, attest  that I have discussed with the patient the benefits, risks, side effects, alternatives, likelihood of achieving goals and potential problems during recovery for the procedure that I have provided informed consent.    Order Specific Question:   Product(s)    Answer:   All Product(s)  . Care order/instruction    Transfuse Parameters    Standing Status:   Future    Standing Expiration Date:   09/11/2021  . Type and screen    Standing Status:   Future    Number of Occurrences:   1    Standing Expiration Date:   09/11/2021  . Prepare RBC (crossmatch)    Standing Status:   Standing    Number of Occurrences:   1    Order Specific Question:   # of Units    Answer:   1 unit    Order Specific Question:   Transfusion Indications    Answer:   Symptomatic Anemia    Order Specific Question:   Special Requirements    Answer:   Irradiated    Order Specific Question:   Number of Units to Keep Ahead    Answer:   NO units ahead    Order Specific Question:   Instructions:    Answer:   Transfuse    Order Specific Question:   If emergent release call blood bank    Answer:   Not emergent release    All questions were answered. The patient knows to call the clinic with any problems, questions or concerns. The total time spent in the appointment was 40 minutes encounter with patients including review of chart and various tests results, discussions about plan of  care and coordination of care plan   [name], MD 09/11/2020 2:36 PM  INTERVAL HISTORY: Please see below for problem oriented charting. She returns with her daughter for further follow-up She stopped taking laxative last week due to diarrhea Her appetite is poor She denies nausea The patient denies any recent signs or symptoms of bleeding such as spontaneous epistaxis, hematuria or hematochezia. She appears to be rocking back and forth during the conversation and her daughter felt this is due to anxiety No recent shortness of breath or  cough No falls at home SUMMARY OF ONCOLOGIC HISTORY: Oncology History Overview Note  High grade serous MMR normal Her2 negative   Uterine cancer ([place])  08/28/2020 Imaging   1. Pelvic mass measuring 7.5 x 4.8 cm, contiguous with the sigmoid colon and uterus with loss of fat planes. Site of origin is unclear, may be ovarian, uterine, or colonic. Lack of contrast limits detailed assessment. 2. Above pelvic mass causes partial obstruction of the left ureter with mild left hydroureteronephrosis. 3. Multifocal adenopathy in the abdomen and pelvis, suspicious for metastatic disease, primarily in the mesentery and lower peritoneum and pelvis. Please note that detailed assessment of adenopathy is limited on this noncontrast exam. Recommend oncology referral.  4. Wall thickening of the sigmoid colon distal to the left pelvic mass with pericolonic edema, suspicious for colitis. There is a moderate to large amount of stool in the more proximal colon, suggesting pelvic mass may be causing delayed colonic transit. 5. Small to moderate hiatal hernia.   08/28/2020 - 09/04/2020 Hospital Admission   The patient has been complaining of reduced urine output, loss of appetite and changes in bowel habits She had abnormal imaging study leading to hospitalization, colonoscopy, biopsy and subsequently chemotherapy   08/30/2020 Procedure   CT-guided biopsy of enlarged left external iliac chain lymph node as above   08/30/2020 Pathology Results   SURGICAL PATHOLOGY  CASE: [id number]  PATIENT: Teresa Oliver  Surgical Pathology Report  Clinical History: Pelvic mass with metastatic lymph adenopathy (crm)  FINAL MICROSCOPIC DIAGNOSIS:   A. LYMPH NODE, LEFT EXTERNAL ILIAC CHAIN, BIOPSY:  - Poorly differentiated carcinoma with necrosis.  - See comment.   COMMENT:  The carcinoma is characterized by diffuse sheets of malignant epithelioid cells with prominent nucleoli and frequent mitotic figures.  Immunohistochemistry is positive with cytokeratin 7 and PAX 8 with weak  positivity with estrogen receptor and WT1.  P53 shows diffuse strong nuclear positivity.  The carcinoma is negative with cytokeratin 20, CDX2, GATA3, GCDFP, TTF-1, HepPar 1, arginase 1 and CD10.  The  morphology and immunophenotype are most consistent with a gynecologic primary including high-grade serous carcinoma.    08/31/2020 Procedure   Colonoscopy - Preparation of the colon was fair. - Likely malignant partially obstructing tumor in the sigmoid colon (appears extrinsic process). Biopsied. Tattooed. - Internal hemorrhoids. - Stool in the entire examined colon   09/01/2020 Initial Diagnosis   Uterine cancer ([place])   09/01/2020 Procedure   Successful placement of a power injectable Port-A-Cath via the right internal jugular vein. The catheter is ready for immediate use.   09/04/2020 -  Chemotherapy    Patient is on Treatment Plan: UTERINE CARBOPLATIN AUC 6 / PACLITAXEL Q21D      09/07/2020 Cancer Staging   Staging form: Corpus Uteri - Carcinoma and Carcinosarcoma, AJCC 8th Edition - Clinical: FIGO Stage IVA (cT4, cN2a, cM0) - Signed by [name], MD on 09/07/2020     REVIEW OF SYSTEMS:   Constitutional:  Denies fevers, chills or abnormal weight loss Eyes: Denies blurriness of vision Ears, nose, mouth, throat, and face: Denies mucositis or sore throat Respiratory: Denies cough, dyspnea or wheezes Cardiovascular: Denies palpitation, chest discomfort or lower extremity swelling Skin: Denies abnormal skin rashes Lymphatics: Denies new lymphadenopathy or easy bruising Neurological:Denies numbness, tingling or new weaknesses All other systems were reviewed with the patient and are negative.  I have reviewed the past medical history, past surgical history, social history and family history with the patient and they are unchanged from previous note.  ALLERGIES:  has No Known Allergies.  MEDICATIONS:  Current  Outpatient Medications  Medication Sig Dispense Refill  . lidocaine-prilocaine (EMLA) cream Apply 1 application topically daily as needed. 30 g 3  . LORazepam (ATIVAN) 0.5 MG tablet Take 1 tablet (0.5 mg total) by mouth 2 (two) times daily as needed for anxiety. 30 tablet 0  . ondansetron (ZOFRAN) 8 MG tablet Take 1 tablet (8 mg total) by mouth every 8 (eight) hours as needed for nausea. 30 tablet 3  . prochlorperazine (COMPAZINE) 10 MG tablet Take 1 tablet (10 mg total) by mouth every 6 (six) hours as needed for nausea or vomiting. 30 tablet 1  . cholecalciferol (VITAMIN D3) 25 MCG (1000 UNIT) tablet Take 1,000 Units by mouth daily.    . Nutritional Supplements (FEEDING SUPPLEMENT, KATE FARMS STANDARD 1.4,) LIQD liquid Take 325 mLs by mouth 2 (two) times daily between meals. 19500 mL 0  . oxybutynin (DITROPAN) 5 MG tablet Take 1 tablet (5 mg total) by mouth every 8 (eight) hours as needed for bladder spasms. 90 tablet 1  . polyethylene glycol (MIRALAX / GLYCOLAX) 17 g packet Take 17 g by mouth 2 (two) times daily as needed for moderate constipation. 14 each 0  . senna-docusate (SENOKOT-S) 8.6-50 MG tablet Take 1 tablet by mouth 2 (two) times daily. 30 tablet 1   No current facility-administered medications for this visit.   Facility-Administered Medications Ordered in Other Visits  Medication Dose Route Frequency Provider Last Rate Last Admin  . heparin lock flush 100 unit/mL  500 Units Intracatheter Daily PRN Alvy Bimler, Kaladin Noseworthy, MD      . sodium chloride flush (NS) 0.9 % injection 10 mL  10 mL Intracatheter PRN Alvy Bimler, Marc Leichter, MD        PHYSICAL EXAMINATION: ECOG PERFORMANCE STATUS: 2 - Symptomatic, <50% confined to bed  Vitals:   09/11/20 1305  BP: (!) 172/84  Pulse: (!) 106  Resp: 18  Temp: 98.2 F (36.8 C)  SpO2: 100%   Filed Weights   09/11/20 1305  Weight: 119 lb (54 kg)    GENERAL:alert, appears somewhat uncomfortable, rocking back and forth.  She looks pale SKIN: skin color,  texture, turgor are normal, no rashes or significant lesions EYES: normal, Conjunctiva are pink and non-injected, sclera clear OROPHARYNX:no exudate, no erythema and lips, buccal mucosa, and tongue normal  NECK: supple, thyroid normal size, non-tender, without nodularity LYMPH:  no palpable lymphadenopathy in the cervical, axillary or inguinal LUNGS: clear to auscultation and percussion with normal breathing effort HEART: regular rate & rhythm and no murmurs and no lower extremity edema ABDOMEN:abdomen soft, non-tender and normal bowel sounds Musculoskeletal:no cyanosis of digits and no clubbing  NEURO: alert & oriented x 3 with fluent speech, no focal motor/sensory deficits  LABORATORY DATA:  I have reviewed the data as listed    Component Value Date/Time   NA 135 09/11/2020 1234   K 3.5 09/11/2020 1234   CL  100 09/11/2020 1234   CO2 24 09/11/2020 1234   GLUCOSE 94 09/11/2020 1234   BUN 17 09/11/2020 1234   CREATININE 0.78 09/11/2020 1234   CALCIUM 7.7 (L) 09/11/2020 1234   PROT 5.4 (L) 09/11/2020 1234   ALBUMIN 2.0 (L) 09/11/2020 1234   AST 41 09/11/2020 1234   ALT 27 09/11/2020 1234   ALKPHOS 128 (H) 09/11/2020 1234   BILITOT 0.3 09/11/2020 1234   GFRNONAA >60 09/11/2020 1234    No results found for: SPEP, UPEP  Lab Results  Component Value Date   WBC 3.4 (L) 09/11/2020   NEUTROABS 2.5 09/11/2020   HGB 6.8 (LL) 09/11/2020   HCT 21.2 (L) 09/11/2020   MCV 65.0 (L) 09/11/2020   PLT 290 09/11/2020      Chemistry      Component Value Date/Time   NA 135 09/11/2020 1234   K 3.5 09/11/2020 1234   CL 100 09/11/2020 1234   CO2 24 09/11/2020 1234   BUN 17 09/11/2020 1234   CREATININE 0.78 09/11/2020 1234      Component Value Date/Time   CALCIUM 7.7 (L) 09/11/2020 1234   ALKPHOS 128 (H) 09/11/2020 1234   AST 41 09/11/2020 1234   ALT 27 09/11/2020 1234   BILITOT 0.3 09/11/2020 1234

## 2020-09-11 NOTE — Assessment & Plan Note (Signed)
She has multifactorial anemia, combination of thalassemia and anemia of chronic illness secondary to recent chemo We discussed some of the risks, benefits, and alternatives of blood transfusions. The patient is symptomatic from anemia and the hemoglobin level is critically low.  Some of the side-effects to be expected including risks of transfusion reactions, chills, infection, syndrome of volume overload and risk of hospitalization from various reasons and the patient is willing to proceed and went ahead to sign consent today. We will order 1 unit of blood today

## 2020-09-11 NOTE — Telephone Encounter (Signed)
Called and reviewed Fridays appts with daughter. She verbalized understanding.

## 2020-09-11 NOTE — Assessment & Plan Note (Signed)
She has completed cycle 1 of treatment, complicated by severe anemia and diarrhea Continue aggressive supportive care Overall, I felt that she is responding well to treatment I will see her prior to cycle 2 of treatment Recommend minimum 3 cycles of therapy before repeat imaging study

## 2020-09-11 NOTE — Patient Instructions (Signed)
Implanted Port Insertion, Care After This sheet gives you information about how to care for yourself after your procedure. Your health care provider may also give you more specific instructions. If you have problems or questions, contact your health care provider. What can I expect after the procedure? After the procedure, it is common to have:  Discomfort at the port insertion site.  Bruising on the skin over the port. This should improve over 3-4 days. Follow these instructions at home: Port care  After your port is placed, you will get a manufacturer's information card. The card has information about your port. Keep this card with you at all times.  Take care of the port as told by your health care provider. Ask your health care provider if you or a family member can get training for taking care of the port at home. A home health care nurse may also take care of the port.  Make sure to remember what type of port you have. Incision care  Follow instructions from your health care provider about how to take care of your port insertion site. Make sure you: ? Wash your hands with soap and water before and after you change your bandage (dressing). If soap and water are not available, use hand sanitizer. ? Change your dressing as told by your health care provider. ? Leave stitches (sutures), skin glue, or adhesive strips in place. These skin closures may need to stay in place for 2 weeks or longer. If adhesive strip edges start to loosen and curl up, you may trim the loose edges. Do not remove adhesive strips completely unless your health care provider tells you to do that.  Check your port insertion site every day for signs of infection. Check for: ? Redness, swelling, or pain. ? Fluid or blood. ? Warmth. ? Pus or a bad smell.      Activity  Return to your normal activities as told by your health care provider. Ask your health care provider what activities are safe for you.  Do not  lift anything that is heavier than 10 lb (4.5 kg), or the limit that you are told, until your health care provider says that it is safe. General instructions  Take over-the-counter and prescription medicines only as told by your health care provider.  Do not take baths, swim, or use a hot tub until your health care provider approves. Ask your health care provider if you may take showers. You may only be allowed to take sponge baths.  Do not drive for 24 hours if you were given a sedative during your procedure.  Wear a medical alert bracelet in case of an emergency. This will tell any health care providers that you have a port.  Keep all follow-up visits as told by your health care provider. This is important. Contact a health care provider if:  You cannot flush your port with saline as directed, or you cannot draw blood from the port.  You have a fever or chills.  You have redness, swelling, or pain around your port insertion site.  You have fluid or blood coming from your port insertion site.  Your port insertion site feels warm to the touch.  You have pus or a bad smell coming from the port insertion site. Get help right away if:  You have chest pain or shortness of breath.  You have bleeding from your port that you cannot control. Summary  Take care of the port as told by your   health care provider. Keep the manufacturer's information card with you at all times.  Change your dressing as told by your health care provider.  Contact a health care provider if you have a fever or chills or if you have redness, swelling, or pain around your port insertion site.  Keep all follow-up visits as told by your health care provider. This information is not intended to replace advice given to you by your health care provider. Make sure you discuss any questions you have with your health care provider. Document Revised: 12/02/2017 Document Reviewed: 12/02/2017 Elsevier Patient Education   2021 Elsevier Inc.  

## 2020-09-11 NOTE — Assessment & Plan Note (Signed)
Her appetite has improved She will continue frequent small meals I recommend the patient to start with easy digestible food and high-protein intake if all possible

## 2020-09-11 NOTE — Assessment & Plan Note (Signed)
Renal failure has resolved Observe closely

## 2020-09-11 NOTE — Patient Instructions (Signed)
Blood Transfusion, Adult °A blood transfusion is a procedure in which you receive blood through an IV tube. You may need this procedure because of: °A bleeding disorder. °An illness. °An injury. °A surgery. °The blood may come from someone else (a donor). You may also be able to donate blood for yourself. The blood given in a transfusion is made up of different types of cells. You may get: °Red blood cells. These carry oxygen to the cells in the body. °White blood cells. These help you fight infections. °Platelets. These help your blood to clot. °Plasma. This is the liquid part of your blood. It carries proteins and other substances through the body. °If you have a clotting disorder, you may also get other types of blood products. °Tell your doctor about: °Any blood disorders you have. °Any reactions you have had during a blood transfusion in the past. °Any allergies you have. °All medicines you are taking, including vitamins, herbs, eye drops, creams, and over-the-counter medicines. °Any surgeries you have had. °Any medical conditions you have. This includes any recent fever or cold symptoms. °Whether you are pregnant or may be pregnant. °What are the risks? °Generally, this is a safe procedure. However, problems may occur. °The most common problems include: °A mild allergic reaction. This includes red, swollen areas of skin (hives) and itching. °Fever or chills. This may be the body's response to new blood cells received. This may happen during or up to 4 hours after the transfusion. °More serious problems may include: °Too much fluid in the lungs. This may cause breathing problems. °A serious allergic reaction. This includes breathing trouble or swelling around the face and lips. °Lung injury. This causes breathing trouble and low oxygen in the blood. This can happen within hours of the transfusion or days later. °Too much iron. This can happen after getting many blood transfusions over a period of time. °An  infection or virus passed through the blood. This is rare. Donated blood is carefully tested before it is given. °Your body's defense system (immune system) trying to attack the new blood cells. This is rare. Symptoms may include fever, chills, nausea, low blood pressure, and low back or chest pain. °Donated cells attacking healthy tissues. This is rare. °What happens before the procedure? °Medicines °Ask your doctor about: °Changing or stopping your normal medicines. This is important. °Taking aspirin and ibuprofen. Do not take these medicines unless your doctor tells you to take them. °Taking over-the-counter medicines, vitamins, herbs, and supplements. °General instructions °Follow instructions from your doctor about what you cannot eat or drink. °You will have a blood test to find out your blood type. The test also finds out what type of blood your body will accept and matches it to the donor type. °If you are going to have a planned surgery, you may be able to donate your own blood. This may be done in case you need a transfusion. °You will have your temperature, blood pressure, and pulse checked. °You may receive medicine to help prevent an allergic reaction. This may be done if you have had a reaction to a transfusion before. This medicine may be given to you by mouth or through an IV tube. °This procedure lasts about 1-4 hours. Plan for the time you need. °What happens during the procedure? °An IV tube will be put into one of your veins. °The bag of donated blood will be attached to your IV tube. Then, the blood will enter through your vein. °Your temperature, blood   pressure, and pulse will be checked often. This is done to find early signs of a transfusion reaction. °Tell your nurse right away if you have any of these symptoms: °Shortness of breath or trouble breathing. °Chest or back pain. °Fever or chills. °Red, swollen areas of skin or itching. °If you have any signs or symptoms of a reaction, your  transfusion will be stopped. You may also be given medicine. °When the transfusion is finished, your IV tube will be taken out. °Pressure may be put on the IV site for a few minutes. °A bandage (dressing) will be put on the IV site. °The procedure may vary among doctors and hospitals.   °What happens after the procedure? °You will be monitored until you leave the hospital or clinic. This includes checking your temperature, blood pressure, pulse, breathing rate, and blood oxygen level. °Your blood may be tested to see how you are responding to the transfusion. °You may be warmed with fluids or blankets. This is done to keep the temperature of your body normal. °If you have your procedure in an outpatient setting, you will be told whom to contact to report any reactions. °Where to find more information °To learn more, visit the [employer]: redcross.org °Summary °A blood transfusion is a procedure in which you are given blood through an IV tube. °The blood may come from someone else (a donor). You may also be able to donate blood for yourself. °The blood you are given is made up of different blood cells. You may receive red blood cells, platelets, plasma, or white blood cells. °Your temperature, blood pressure, and pulse will be checked often. °After the procedure, your blood may be tested to see how you are responding. °This information is not intended to replace advice given to you by your health care provider. Make sure you discuss any questions you have with your health care provider. °Document Revised: 10/29/2018 Document Reviewed: 10/29/2018 °Elsevier Patient Education © 2021 Elsevier Inc. ° °

## 2020-09-12 LAB — TYPE AND SCREEN
ABO/RH(D): A POS
Antibody Screen: NEGATIVE
Unit division: 0

## 2020-09-12 LAB — BPAM RBC
Blood Product Expiration Date: 202205132359
ISSUE DATE / TIME: 202204251453
Unit Type and Rh: 6200

## 2020-09-15 ENCOUNTER — Inpatient Hospital Stay: Payer: Medicare Other

## 2020-09-15 ENCOUNTER — Other Ambulatory Visit: Payer: Self-pay

## 2020-09-15 DIAGNOSIS — C775 Secondary and unspecified malignant neoplasm of intrapelvic lymph nodes: Secondary | ICD-10-CM | POA: Diagnosis not present

## 2020-09-15 DIAGNOSIS — Z79899 Other long term (current) drug therapy: Secondary | ICD-10-CM | POA: Diagnosis not present

## 2020-09-15 DIAGNOSIS — N179 Acute kidney failure, unspecified: Secondary | ICD-10-CM

## 2020-09-15 DIAGNOSIS — N183 Chronic kidney disease, stage 3 unspecified: Secondary | ICD-10-CM | POA: Diagnosis not present

## 2020-09-15 DIAGNOSIS — C549 Malignant neoplasm of corpus uteri, unspecified: Secondary | ICD-10-CM

## 2020-09-15 DIAGNOSIS — D539 Nutritional anemia, unspecified: Secondary | ICD-10-CM

## 2020-09-15 DIAGNOSIS — E440 Moderate protein-calorie malnutrition: Secondary | ICD-10-CM | POA: Diagnosis not present

## 2020-09-15 LAB — SAMPLE TO BLOOD BANK

## 2020-09-15 LAB — CBC WITH DIFFERENTIAL/PLATELET
Abs Immature Granulocytes: 0.21 10*3/uL — ABNORMAL HIGH (ref 0.00–0.07)
Basophils Absolute: 0 10*3/uL (ref 0.0–0.1)
Basophils Relative: 0 %
Eosinophils Absolute: 0.2 10*3/uL (ref 0.0–0.5)
Eosinophils Relative: 3 %
HCT: 27.3 % — ABNORMAL LOW (ref 36.0–46.0)
Hemoglobin: 8.8 g/dL — ABNORMAL LOW (ref 12.0–15.0)
Immature Granulocytes: 4 %
Lymphocytes Relative: 14 %
Lymphs Abs: 0.7 10*3/uL (ref 0.7–4.0)
MCH: 22.2 pg — ABNORMAL LOW (ref 26.0–34.0)
MCHC: 32.2 g/dL (ref 30.0–36.0)
MCV: 68.9 fL — ABNORMAL LOW (ref 80.0–100.0)
Monocytes Absolute: 0.8 10*3/uL (ref 0.1–1.0)
Monocytes Relative: 17 %
Neutro Abs: 2.9 10*3/uL (ref 1.7–7.7)
Neutrophils Relative %: 62 %
Platelets: 338 10*3/uL (ref 150–400)
RBC: 3.96 MIL/uL (ref 3.87–5.11)
RDW: 17.4 % — ABNORMAL HIGH (ref 11.5–15.5)
WBC: 4.8 10*3/uL (ref 4.0–10.5)
nRBC: 0.4 % — ABNORMAL HIGH (ref 0.0–0.2)

## 2020-09-20 ENCOUNTER — Telehealth: Payer: Self-pay

## 2020-09-20 MED ORDER — AMLODIPINE BESYLATE 10 MG PO TABS: 10.0000 mg | ORAL_TABLET | Freq: Every day | ORAL | 2 refills | Status: DC

## 2020-09-20 NOTE — Telephone Encounter (Signed)
Patient called to report recent blood pressure readings:  5/2     164/80 5/3     169/98 5/4     174/100  Patient has history of hypertension and stated she was taken off of medication, Losartan/HCTZ 100/25mg  while she was hospitalized. Patient vocalized she was very anxious.  Patient reports she has Ativan that she doesn't like to take due to, "1/2 a tablet doesn't really work, but a whole tablet knocks me out."   Patient denies any headache, vision changes, shortness of breath, or chest pain.   Will forward to MD for further recommendations.

## 2020-09-20 NOTE — Telephone Encounter (Signed)
Call in 10 mg amlodipine to local pharmacy, 30 days, 2 refills Will reassess next visit Continue to check BP and ask her to also document HR

## 2020-09-20 NOTE — Telephone Encounter (Signed)
RN notified patient. Patient verbalized understanding and agreement.   Medication sent to pharmacy.

## 2020-09-20 NOTE — Addendum Note (Signed)
Addended by: Rennis Harding on: 09/20/2020 11:12 AM   Modules accepted: Orders

## 2020-09-21 ENCOUNTER — Encounter: Payer: Self-pay | Admitting: Hematology and Oncology

## 2020-09-21 NOTE — Progress Notes (Signed)
Called pt to introduce myself as her Financial Resource Specialist and to discuss the [employer] grant.  Unfortunately there aren't any foundations offering copay assistance for her Dx and the type of ins she has.  I left a msg requesting she return my call if she's interested in applying for the grant.  

## 2020-09-25 ENCOUNTER — Inpatient Hospital Stay: Payer: Medicare Other | Admitting: Hematology and Oncology

## 2020-09-25 ENCOUNTER — Inpatient Hospital Stay: Payer: Medicare Other | Attending: Hematology and Oncology

## 2020-09-25 ENCOUNTER — Other Ambulatory Visit: Payer: Self-pay

## 2020-09-25 ENCOUNTER — Encounter: Payer: Self-pay | Admitting: Hematology and Oncology

## 2020-09-25 ENCOUNTER — Inpatient Hospital Stay: Payer: Medicare Other

## 2020-09-25 ENCOUNTER — Telehealth: Payer: Self-pay

## 2020-09-25 VITALS — HR 107

## 2020-09-25 DIAGNOSIS — R232 Flushing: Secondary | ICD-10-CM | POA: Insufficient documentation

## 2020-09-25 DIAGNOSIS — F411 Generalized anxiety disorder: Secondary | ICD-10-CM | POA: Insufficient documentation

## 2020-09-25 DIAGNOSIS — Z79899 Other long term (current) drug therapy: Secondary | ICD-10-CM | POA: Insufficient documentation

## 2020-09-25 DIAGNOSIS — D539 Nutritional anemia, unspecified: Secondary | ICD-10-CM

## 2020-09-25 DIAGNOSIS — Z5111 Encounter for antineoplastic chemotherapy: Secondary | ICD-10-CM | POA: Insufficient documentation

## 2020-09-25 DIAGNOSIS — R000 Tachycardia, unspecified: Secondary | ICD-10-CM | POA: Insufficient documentation

## 2020-09-25 DIAGNOSIS — N135 Crossing vessel and stricture of ureter without hydronephrosis: Secondary | ICD-10-CM | POA: Insufficient documentation

## 2020-09-25 DIAGNOSIS — R1900 Intra-abdominal and pelvic swelling, mass and lump, unspecified site: Secondary | ICD-10-CM | POA: Diagnosis not present

## 2020-09-25 DIAGNOSIS — N183 Chronic kidney disease, stage 3 unspecified: Secondary | ICD-10-CM | POA: Insufficient documentation

## 2020-09-25 DIAGNOSIS — C549 Malignant neoplasm of corpus uteri, unspecified: Secondary | ICD-10-CM

## 2020-09-25 DIAGNOSIS — N179 Acute kidney failure, unspecified: Secondary | ICD-10-CM

## 2020-09-25 DIAGNOSIS — K449 Diaphragmatic hernia without obstruction or gangrene: Secondary | ICD-10-CM | POA: Insufficient documentation

## 2020-09-25 DIAGNOSIS — R590 Localized enlarged lymph nodes: Secondary | ICD-10-CM | POA: Insufficient documentation

## 2020-09-25 LAB — CBC WITH DIFFERENTIAL/PLATELET
Abs Immature Granulocytes: 1 10*3/uL — ABNORMAL HIGH (ref 0.00–0.07)
Basophils Absolute: 0.2 10*3/uL — ABNORMAL HIGH (ref 0.0–0.1)
Basophils Relative: 2 %
Eosinophils Absolute: 0.3 10*3/uL (ref 0.0–0.5)
Eosinophils Relative: 2 %
HCT: 28.5 % — ABNORMAL LOW (ref 36.0–46.0)
Hemoglobin: 9 g/dL — ABNORMAL LOW (ref 12.0–15.0)
Immature Granulocytes: 9 %
Lymphocytes Relative: 24 %
Lymphs Abs: 2.6 10*3/uL (ref 0.7–4.0)
MCH: 21.8 pg — ABNORMAL LOW (ref 26.0–34.0)
MCHC: 31.6 g/dL (ref 30.0–36.0)
MCV: 69.2 fL — ABNORMAL LOW (ref 80.0–100.0)
Monocytes Absolute: 1 10*3/uL (ref 0.1–1.0)
Monocytes Relative: 9 %
Neutro Abs: 6.2 10*3/uL (ref 1.7–7.7)
Neutrophils Relative %: 54 %
Platelets: 417 10*3/uL — ABNORMAL HIGH (ref 150–400)
RBC: 4.12 MIL/uL (ref 3.87–5.11)
RDW: 19.2 % — ABNORMAL HIGH (ref 11.5–15.5)
WBC: 11.2 10*3/uL — ABNORMAL HIGH (ref 4.0–10.5)
nRBC: 0 % (ref 0.0–0.2)

## 2020-09-25 LAB — COMPREHENSIVE METABOLIC PANEL
ALT: 15 U/L (ref 0–44)
AST: 19 U/L (ref 15–41)
Albumin: 2.9 g/dL — ABNORMAL LOW (ref 3.5–5.0)
Alkaline Phosphatase: 99 U/L (ref 38–126)
Anion gap: 14 (ref 5–15)
BUN: 26 mg/dL — ABNORMAL HIGH (ref 8–23)
CO2: 22 mmol/L (ref 22–32)
Calcium: 8.9 mg/dL (ref 8.9–10.3)
Chloride: 103 mmol/L (ref 98–111)
Creatinine, Ser: 1.01 mg/dL — ABNORMAL HIGH (ref 0.44–1.00)
GFR, Estimated: 58 mL/min — ABNORMAL LOW (ref >60)
Glucose, Bld: 117 mg/dL — ABNORMAL HIGH (ref 70–99)
Potassium: 4.2 mmol/L (ref 3.5–5.1)
Sodium: 139 mmol/L (ref 135–145)
Total Bilirubin: 0.3 mg/dL (ref 0.3–1.2)
Total Protein: 7.2 g/dL (ref 6.5–8.1)

## 2020-09-25 LAB — MAGNESIUM: Magnesium: 0.9 mg/dL — CL (ref 1.7–2.4)

## 2020-09-25 MED ORDER — PALONOSETRON HCL INJECTION 0.25 MG/5ML
INTRAVENOUS | Status: AC
  Filled 2020-09-25: qty 5

## 2020-09-25 MED ORDER — HEPARIN SOD (PORK) LOCK FLUSH 100 UNIT/ML IV SOLN
500.0000 [IU] | Freq: Once | INTRAVENOUS | Status: AC | PRN
  Administered 2020-09-25: 500 [IU]
  Filled 2020-09-25: qty 5

## 2020-09-25 MED ORDER — SODIUM CHLORIDE 0.9 % IV SOLN
150.0000 mg | Freq: Once | INTRAVENOUS | Status: AC
  Administered 2020-09-25: 150 mg via INTRAVENOUS
  Filled 2020-09-25: qty 150

## 2020-09-25 MED ORDER — DIPHENHYDRAMINE HCL 50 MG/ML IJ SOLN
INTRAMUSCULAR | Status: AC
  Filled 2020-09-25: qty 1

## 2020-09-25 MED ORDER — MAGNESIUM SULFATE 2 GM/50ML IV SOLN
INTRAVENOUS | Status: AC
  Filled 2020-09-25: qty 50

## 2020-09-25 MED ORDER — PALONOSETRON HCL INJECTION 0.25 MG/5ML
0.2500 mg | Freq: Once | INTRAVENOUS | Status: AC
  Administered 2020-09-25: 0.25 mg via INTRAVENOUS

## 2020-09-25 MED ORDER — MAGNESIUM OXIDE -MG SUPPLEMENT 400 (240 MG) MG PO TABS: 400.0000 mg | ORAL_TABLET | Freq: Two times a day (BID) | ORAL | 1 refills | Status: DC

## 2020-09-25 MED ORDER — DIPHENHYDRAMINE HCL 50 MG/ML IJ SOLN
12.5000 mg | Freq: Once | INTRAMUSCULAR | Status: AC
Start: 2020-09-25 — End: 2020-09-25
  Administered 2020-09-25: 12.5 mg via INTRAVENOUS

## 2020-09-25 MED ORDER — SODIUM CHLORIDE 0.9 % IV SOLN
281.2500 mg | Freq: Once | INTRAVENOUS | Status: AC
  Administered 2020-09-25: 280 mg via INTRAVENOUS
  Filled 2020-09-25: qty 28

## 2020-09-25 MED ORDER — SODIUM CHLORIDE 0.9% FLUSH
10.0000 mL | Freq: Once | INTRAVENOUS | Status: AC
  Administered 2020-09-25: 10 mL
  Filled 2020-09-25: qty 10

## 2020-09-25 MED ORDER — MAGNESIUM SULFATE 2 GM/50ML IV SOLN
2.0000 g | Freq: Once | INTRAVENOUS | Status: AC
  Administered 2020-09-25: 2 g via INTRAVENOUS

## 2020-09-25 MED ORDER — SODIUM CHLORIDE 0.9 % IV SOLN
175.0000 mg/m2 | Freq: Once | INTRAVENOUS | Status: AC
  Administered 2020-09-25: 252 mg via INTRAVENOUS
  Filled 2020-09-25: qty 42

## 2020-09-25 MED ORDER — FAMOTIDINE 20 MG IN NS 100 ML IVPB
INTRAVENOUS | Status: AC
  Filled 2020-09-25: qty 100

## 2020-09-25 MED ORDER — SODIUM CHLORIDE 0.9% FLUSH
10.0000 mL | INTRAVENOUS | Status: DC | PRN
  Administered 2020-09-25: 10 mL
  Filled 2020-09-25: qty 10

## 2020-09-25 MED ORDER — SODIUM CHLORIDE 0.9 % IV SOLN
Freq: Once | INTRAVENOUS | Status: AC
  Filled 2020-09-25: qty 250

## 2020-09-25 MED ORDER — FAMOTIDINE 20 MG IN NS 100 ML IVPB
20.0000 mg | Freq: Once | INTRAVENOUS | Status: AC
  Administered 2020-09-25: 20 mg via INTRAVENOUS

## 2020-09-25 MED ORDER — SODIUM CHLORIDE 0.9 % IV SOLN
10.0000 mg | Freq: Once | INTRAVENOUS | Status: AC
  Administered 2020-09-25: 10 mg via INTRAVENOUS
  Filled 2020-09-25: qty 10

## 2020-09-25 MED ORDER — DEXAMETHASONE 4 MG PO TABS: ORAL_TABLET | ORAL | 6 refills | Status: DC

## 2020-09-25 NOTE — Progress Notes (Signed)
[facility] OFFICE PROGRESS NOTE  Patient Care Team: Kim, James, MD as PCP - General (Internal Medicine)  ASSESSMENT & PLAN:  Uterine cancer (HCC) She has completed cycle 1 of treatment, complicated by severe anemia and diarrhea, both resolved  However, she has persistent anemia, anxiety and hypomagnesemia continue aggressive supportive care Overall, I felt that she is responding well to treatment Recommend minimum 3 cycles of therapy before repeat imaging study  Hypomagnesemia She has significant hypomagnesemia I will order replacement therapy  CKD (chronic kidney disease), stage III (HCC) She had fluctuation of her serum creatinine I will adjust the dose of chemotherapy accordingly  Deficiency anemia She has multifactorial anemia, combination of thalassemia and recent anemia chronic illness She received blood transfusion and her anemia is stable I am concerned about worsening anemia in the future and will reduce carboplatin a little bit  Anxiety, generalized She is anxious and appears tachycardic She will continue antianxiety medicine as directed   No orders of the defined types were placed in this encounter.   All questions were answered. The patient knows to call the clinic with any problems, questions or concerns. The total time spent in the appointment was 30 minutes encounter with patients including review of chart and various tests results, discussions about plan of care and coordination of care plan   Ni Gorsuch, MD 09/25/2020 10:43 AM  INTERVAL HISTORY: Please see below for problem oriented charting. She returns for treatment and follow-up Since last time I saw her, she felt better Diarrhea has resolved Her energy level has improved She denies abdominal pain no nausea Her appetite has improved  SUMMARY OF ONCOLOGIC HISTORY: Oncology History Overview Note  High grade serous MMR normal Her2 negative   Uterine cancer (HCC)  08/28/2020 Imaging    1. Pelvic mass measuring 7.5 x 4.8 cm, contiguous with the sigmoid colon and uterus with loss of fat planes. Site of origin is unclear, may be ovarian, uterine, or colonic. Lack of contrast limits detailed assessment. 2. Above pelvic mass causes partial obstruction of the left ureter with mild left hydroureteronephrosis. 3. Multifocal adenopathy in the abdomen and pelvis, suspicious for metastatic disease, primarily in the mesentery and lower peritoneum and pelvis. Please note that detailed assessment of adenopathy is limited on this noncontrast exam. Recommend oncology referral.  4. Wall thickening of the sigmoid colon distal to the left pelvic mass with pericolonic edema, suspicious for colitis. There is a moderate to large amount of stool in the more proximal colon, suggesting pelvic mass may be causing delayed colonic transit. 5. Small to moderate hiatal hernia.   08/28/2020 - 09/04/2020 Hospital Admission   The patient has been complaining of reduced urine output, loss of appetite and changes in bowel habits She had abnormal imaging study leading to hospitalization, colonoscopy, biopsy and subsequently chemotherapy   08/30/2020 Procedure   CT-guided biopsy of enlarged left external iliac chain lymph node as above   08/30/2020 Pathology Results   SURGICAL PATHOLOGY  CASE: [id number]  PATIENT: Teresa Oliver  Surgical Pathology Report  Clinical History: Pelvic mass with metastatic lymph adenopathy (crm)  FINAL MICROSCOPIC DIAGNOSIS:   A. LYMPH NODE, LEFT EXTERNAL ILIAC CHAIN, BIOPSY:  - Poorly differentiated carcinoma with necrosis.  - See comment.   COMMENT:  The carcinoma is characterized by diffuse sheets of malignant epithelioid cells with prominent nucleoli and frequent mitotic figures. Immunohistochemistry is positive with cytokeratin 7 and PAX 8 with weak  positivity with estrogen receptor and WT1.  P53   shows diffuse strong nuclear positivity.  The carcinoma is negative with  cytokeratin 20, CDX2, GATA3, GCDFP, TTF-1, HepPar 1, arginase 1 and CD10.  The  morphology and immunophenotype are most consistent with a gynecologic primary including high-grade serous carcinoma.    08/31/2020 Procedure   Colonoscopy - Preparation of the colon was fair. - Likely malignant partially obstructing tumor in the sigmoid colon (appears extrinsic process). Biopsied. Tattooed. - Internal hemorrhoids. - Stool in the entire examined colon   09/01/2020 Initial Diagnosis   Uterine cancer (HCC)   09/01/2020 Procedure   Successful placement of a power injectable Port-A-Cath via the right internal jugular vein. The catheter is ready for immediate use.   09/04/2020 -  Chemotherapy    Patient is on Treatment Plan: UTERINE CARBOPLATIN AUC 6 / PACLITAXEL Q21D      09/07/2020 Cancer Staging   Staging form: Corpus Uteri - Carcinoma and Carcinosarcoma, AJCC 8th Edition - Clinical: FIGO Stage IVA (cT4, cN2a, cM0) - Signed by Gorsuch, Ni, MD on 09/07/2020     REVIEW OF SYSTEMS:   Constitutional: Denies fevers, chills or abnormal weight loss Eyes: Denies blurriness of vision Ears, nose, mouth, throat, and face: Denies mucositis or sore throat Respiratory: Denies cough, dyspnea or wheezes Cardiovascular: Denies palpitation, chest discomfort or lower extremity swelling Gastrointestinal:  Denies nausea, heartburn or change in bowel habits Skin: Denies abnormal skin rashes Lymphatics: Denies new lymphadenopathy or easy bruising Neurological:Denies numbness, tingling or new weaknesses Behavioral/Psych: Mood is stable, no new changes  All other systems were reviewed with the patient and are negative.  I have reviewed the past medical history, past surgical history, social history and family history with the patient and they are unchanged from previous note.  ALLERGIES:  has No Known Allergies.  MEDICATIONS:  Current Outpatient Medications  Medication Sig Dispense Refill  . dexamethasone  (DECADRON) 4 MG tablet Take 2 tabs at the night before and 2 tab the morning of chemotherapy, every 3 weeks, by mouth x 6 cycles 36 tablet 6  . magnesium oxide (MAG-OX) 400 (240 Mg) MG tablet Take 1 tablet (400 mg total) by mouth 2 (two) times daily. 60 tablet 1  . amLODipine (NORVASC) 10 MG tablet Take 1 tablet (10 mg total) by mouth daily. 30 tablet 2  . cholecalciferol (VITAMIN D3) 25 MCG (1000 UNIT) tablet Take 1,000 Units by mouth daily.    . lidocaine-prilocaine (EMLA) cream Apply 1 application topically daily as needed. 30 g 3  . LORazepam (ATIVAN) 0.5 MG tablet Take 1 tablet (0.5 mg total) by mouth 2 (two) times daily as needed for anxiety. 30 tablet 0  . Nutritional Supplements (FEEDING SUPPLEMENT, KATE FARMS STANDARD 1.4,) LIQD liquid Take 325 mLs by mouth 2 (two) times daily between meals. 19500 mL 0  . ondansetron (ZOFRAN) 8 MG tablet Take 1 tablet (8 mg total) by mouth every 8 (eight) hours as needed for nausea. 30 tablet 3  . oxybutynin (DITROPAN) 5 MG tablet Take 1 tablet (5 mg total) by mouth every 8 (eight) hours as needed for bladder spasms. 90 tablet 1  . polyethylene glycol (MIRALAX / GLYCOLAX) 17 g packet Take 17 g by mouth 2 (two) times daily as needed for moderate constipation. 14 each 0  . prochlorperazine (COMPAZINE) 10 MG tablet Take 1 tablet (10 mg total) by mouth every 6 (six) hours as needed for nausea or vomiting. 30 tablet 1  . senna-docusate (SENOKOT-S) 8.6-50 MG tablet Take 1 tablet by mouth 2 (two) times daily.   30 tablet 1   No current facility-administered medications for this visit.   Facility-Administered Medications Ordered in Other Visits  Medication Dose Route Frequency Provider Last Rate Last Admin  . 0.9 %  sodium chloride infusion   Intravenous Once Gorsuch, Ni, MD      . CARBOplatin (PARAPLATIN) 280 mg in sodium chloride 0.9 % 100 mL chemo infusion  280 mg Intravenous Once Gorsuch, Ni, MD      . dexamethasone (DECADRON) 10 mg in sodium chloride 0.9 % 50  mL IVPB  10 mg Intravenous Once Gorsuch, Ni, MD      . diphenhydrAMINE (BENADRYL) injection 12.5 mg  12.5 mg Intravenous Once Gorsuch, Ni, MD      . famotidine (PEPCID) IVPB 20 mg in NS 100 mL IVPB  20 mg Intravenous Once Gorsuch, Ni, MD      . fosaprepitant (EMEND) 150 mg in sodium chloride 0.9 % 145 mL IVPB  150 mg Intravenous Once Gorsuch, Ni, MD      . heparin lock flush 100 unit/mL  500 Units Intracatheter Once PRN Gorsuch, Ni, MD      . PACLitaxel (TAXOL) 252 mg in sodium chloride 0.9 % 250 mL chemo infusion (> 80mg/m2)  175 mg/m2 (Order-Specific) Intravenous Once Gorsuch, Ni, MD      . palonosetron (ALOXI) injection 0.25 mg  0.25 mg Intravenous Once Gorsuch, Ni, MD      . sodium chloride flush (NS) 0.9 % injection 10 mL  10 mL Intracatheter PRN Gorsuch, Ni, MD        PHYSICAL EXAMINATION: ECOG PERFORMANCE STATUS: 1 - Symptomatic but completely ambulatory  Vitals:   09/25/20 0951  BP: (!) 164/89  Pulse: (!) 120  Resp: 18  Temp: 98.1 F (36.7 C)  SpO2: 100%   Filed Weights   09/25/20 0951  Weight: 107 lb 3.2 oz (48.6 kg)    GENERAL:alert, no distress and comfortable SKIN: skin color, texture, turgor are normal, no rashes or significant lesions EYES: normal, Conjunctiva are pink and non-injected, sclera clear OROPHARYNX:no exudate, no erythema and lips, buccal mucosa, and tongue normal  NECK: supple, thyroid normal size, non-tender, without nodularity LYMPH:  no palpable lymphadenopathy in the cervical, axillary or inguinal LUNGS: clear to auscultation and percussion with normal breathing effort HEART: regular rate & rhythm and no murmurs and no lower extremity edema ABDOMEN:abdomen soft, non-tender and normal bowel sounds Musculoskeletal:no cyanosis of digits and no clubbing  NEURO: alert & oriented x 3 with fluent speech, no focal motor/sensory deficits  LABORATORY DATA:  I have reviewed the data as listed    Component Value Date/Time   NA 139 09/25/2020 0920   K 4.2  09/25/2020 0920   CL 103 09/25/2020 0920   CO2 22 09/25/2020 0920   GLUCOSE 117 (H) 09/25/2020 0920   BUN 26 (H) 09/25/2020 0920   CREATININE 1.01 (H) 09/25/2020 0920   CALCIUM 8.9 09/25/2020 0920   PROT 7.2 09/25/2020 0920   ALBUMIN 2.9 (L) 09/25/2020 0920   AST 19 09/25/2020 0920   ALT 15 09/25/2020 0920   ALKPHOS 99 09/25/2020 0920   BILITOT 0.3 09/25/2020 0920   GFRNONAA 58 (L) 09/25/2020 0920    No results found for: SPEP, UPEP  Lab Results  Component Value Date   WBC 11.2 (H) 09/25/2020   NEUTROABS 6.2 09/25/2020   HGB 9.0 (L) 09/25/2020   HCT 28.5 (L) 09/25/2020   MCV 69.2 (L) 09/25/2020   PLT 417 (H) 09/25/2020        Chemistry      Component Value Date/Time   NA 139 09/25/2020 0920   K 4.2 09/25/2020 0920   CL 103 09/25/2020 0920   CO2 22 09/25/2020 0920   BUN 26 (H) 09/25/2020 0920   CREATININE 1.01 (H) 09/25/2020 0920      Component Value Date/Time   CALCIUM 8.9 09/25/2020 0920   ALKPHOS 99 09/25/2020 0920   AST 19 09/25/2020 0920   ALT 15 09/25/2020 0920   BILITOT 0.3 09/25/2020 0920       RADIOGRAPHIC STUDIES: I have personally reviewed the radiological images as listed and agreed with the findings in the report. CT ABDOMEN PELVIS WO CONTRAST  Result Date: 08/28/2020 CLINICAL DATA:  Abdominal pain, acute, nonlocalized EXAM: CT ABDOMEN AND PELVIS WITHOUT CONTRAST TECHNIQUE: Multidetector CT imaging of the abdomen and pelvis was performed following the standard protocol without IV contrast. COMPARISON:  Radiograph 08/25/2020 FINDINGS: Lower chest: Lung bases are clear. No focal consolidation or pleural fluid. Small hiatal hernia. Hepatobiliary: No focal liver abnormality is seen. No gallstones, gallbladder wall thickening, or biliary dilatation. Pancreas: No ductal dilatation or inflammation. Spleen: Normal in size without focal abnormality. Adrenals/Urinary Tract: No adrenal nodule. Minimal left hydronephrosis. Left ureter is dilated to the pelvis. No  renal or ureteral stone. No significant perinephric edema. No right hydronephrosis. No right renal stone. Decompressed right ureter. Urinary bladder is essentially empty and not well assessed. Stomach/Bowel: Bowel evaluation is limited in the absence of enteric contrast. There is wall thickening of the sigmoid colon with mild pericolonic edema. The mid sigmoid colon appears apposed to the uterus with obliteration of normal fat plane. Sigmoid colon is also difficult to delineate from a pelvic mass, which is also contiguous with the uterus. Please note that detailed assessment is limited on this noncontrast exam. There is a moderate to large volume of stool in the more proximal colon. No definite small bowel obstruction or inflammatory change. Appendix is normal. Small to moderate hiatal hernia. Vascular/Lymphatic: Multifocal adenopathy. Please note that detailed assessment of adenopathy is limited on this noncontrast exam. For instance, left periaortic node measures 16 mm short axis, series 2, image 30. Additional prominent and mildly enlarged lower retroperitoneal lymph nodes are seen. Right upper quadrant adenopathy with lymph nodes measuring 11 in 15 mm, series 2, image 36, likely in the mesentery. Right lower quadrant mesenteric node measures 13 mm, series 2, image 44. There additional enlarged ileocolic lymph nodes. 9 mm right external iliac node. Rounded soft tissue mass about the left pelvic sidewall measures 2.3 x 3.3 cm, may be adnexal mass or iliac adenopathy. There is a 17 mm left external iliac node, series 2, image 58. Prominent bilateral inguinal nodes. Reproductive: Pelvic mass just to the left of midline measures 7.5 x 4.8 cm, and is contiguous with the sigmoid colon and uterus. The ovary is not definitively visualized. Right ovary is not definitively seen. Other: Mild fat stranding in the pelvis as well as presacral region. Minimal omental edema without obvious omental thickening or mass. No ascites.  No free air. Musculoskeletal: No acute osseous abnormality or focal bone lesion. No subcutaneous abnormality. IMPRESSION: 1. Pelvic mass measuring 7.5 x 4.8 cm, contiguous with the sigmoid colon and uterus with loss of fat planes. Site of origin is unclear, may be ovarian, uterine, or colonic. Lack of contrast limits detailed assessment. 2. Above pelvic mass causes partial obstruction of the left ureter with mild left hydroureteronephrosis. 3. Multifocal adenopathy in the abdomen and pelvis, suspicious for   metastatic disease, primarily in the mesentery and lower peritoneum and pelvis. Please note that detailed assessment of adenopathy is limited on this noncontrast exam. Recommend oncology referral. 4. Wall thickening of the sigmoid colon distal to the left pelvic mass with pericolonic edema, suspicious for colitis. There is a moderate to large amount of stool in the more proximal colon, suggesting pelvic mass may be causing delayed colonic transit. 5. Small to moderate hiatal hernia. Electronically Signed   By: Melanie  Sanford M.D.   On: 08/28/2020 18:30   CT BIOPSY  Result Date: 08/30/2020 INDICATION: 75-year-old woman with uterine mass and pelvic lymphadenopathy presents to interventional radiology for biopsy of left external iliac chain lymph node. EXAM: CT-guided biopsy of left external iliac chain lymph node. MEDICATIONS: None. ANESTHESIA/SEDATION: Moderate (conscious) sedation was employed during this procedure. A total of Versed 2 mg and Fentanyl 50 mcg was administered intravenously. Moderate Sedation Time: 10 minutes. The patient's level of consciousness and vital signs were monitored continuously by radiology nursing throughout the procedure under my direct supervision. COMPLICATIONS: None immediate. PROCEDURE: Informed written consent was obtained from the patient after a thorough discussion of the procedural risks, benefits and alternatives. All questions were addressed. Maximal Sterile Barrier  Technique was utilized including caps, mask, sterile gowns, sterile gloves, sterile drape, hand hygiene and skin antiseptic. A timeout was performed prior to the initiation of the procedure. Patient position supine on the CT table. Left inguinal skin prepped and draped in usual sterile fashion. Following local lidocaine administration, 17 gauge introducer needle was advanced into the enlarged left external iliac chain lymph node utilizing CT guidance. Three cores were obtained from the enlarged left external iliac chain lymph node and sent to pathology in formalin. Needle removed and hemostasis achieved with manual compression. IMPRESSION: CT-guided biopsy of enlarged left external iliac chain lymph node as above. Electronically Signed   By: Farhaan  Mir M.D.   On: 08/30/2020 13:17   IR IMAGING GUIDED PORT INSERTION  Result Date: 09/01/2020 INDICATION: 75-year-old female with pelvic malignancy requiring central venous access for chemotherapy. EXAM: IMPLANTED PORT A CATH PLACEMENT WITH ULTRASOUND AND FLUOROSCOPIC GUIDANCE COMPARISON:  None. MEDICATIONS: None. ANESTHESIA/SEDATION: Moderate (conscious) sedation was employed during this procedure. A total of Versed 2 mg and Fentanyl 100 mcg was administered intravenously. Moderate Sedation Time: 15 minutes. The patient's level of consciousness and vital signs were monitored continuously by radiology nursing throughout the procedure under my direct supervision. CONTRAST:  None FLUOROSCOPY TIME:  0 minutes, 6 seconds (1 mGy) COMPLICATIONS: None immediate. PROCEDURE: The procedure, risks, benefits, and alternatives were explained to the patient. Questions regarding the procedure were encouraged and answered. The patient understands and consents to the procedure. The right neck and chest were prepped with chlorhexidine in a sterile fashion, and a sterile drape was applied covering the operative field. Maximum barrier sterile technique with sterile gowns and gloves were  used for the procedure. A timeout was performed prior to the initiation of the procedure. Ultrasound was used to examine the jugular vein which was compressible and free of internal echoes. A skin marker was used to demarcate the planned venotomy and port pocket incision sites. Local anesthesia was provided to these sites and the subcutaneous tunnel track with 1% lidocaine with 1:100,000 epinephrine. A small incision was created at the jugular access site and blunt dissection was performed of the subcutaneous tissues. Under ultrasound guidance, the jugular vein was accessed with a 21 ga micropuncture needle and an 0.018" wire was inserted to the   superior vena cava. Real-time ultrasound guidance was utilized for vascular access including the acquisition of a permanent ultrasound image documenting patency of the accessed vessel. A 5 Fr micopuncture set was then used, through which a 0.035" Rosen wire was passed under fluoroscopic guidance into the inferior vena cava. An 8 Fr dilator was then placed over the wire. A subcutaneous port pocket was then created along the upper chest wall utilizing a combination of sharp and blunt dissection. The pocket was irrigated with sterile saline, packed with gauze, and observed for hemorrhage. A single lumen "ISP" sized power injectable port was chosen for placement. The 8 Fr catheter was tunneled from the port pocket site to the venotomy incision. The port was placed in the pocket. The external catheter was trimmed to appropriate length. The dilator was exchanged for an 8 Fr peel-away sheath under fluoroscopic guidance. The catheter was then placed through the sheath and the sheath was removed. Final catheter positioning was confirmed and documented with a fluoroscopic spot radiograph. The port was accessed with a Huber needle, aspirated, and flushed with heparinized saline. The deep dermal layer of the port pocket incision was closed with interrupted 3-0 Vicryl suture. Dermabond  was then placed over the port pocket and neck incisions. The patient tolerated the procedure well without immediate post procedural complication. FINDINGS: After catheter placement, the tip lies within the superior cavoatrial junction. The catheter aspirates and flushes normally and is ready for immediate use. IMPRESSION: Successful placement of a power injectable Port-A-Cath via the right internal jugular vein. The catheter is ready for immediate use. Dylan Suttle, MD [facility] Electronically Signed   By: Dylan  Suttle MD   On: 09/01/2020 11:35   

## 2020-09-25 NOTE — Telephone Encounter (Signed)
CRITICAL VALUE STICKER  CRITICAL VALUE: Magnesium - 0.9  RECEIVER (on-site recipient of call): Patty Sermons, RN  [place] NOTIFIED: 09/25/2020 @ 10:36  MESSENGER (representative from lab): Ulice Dash  MD NOTIFIED: Dr. Alvy Bimler  TIME OF NOTIFICATION: 09/25/2020 @ 10:38  RESPONSE: MD confirmed and aware of critical lab value

## 2020-09-25 NOTE — Assessment & Plan Note (Signed)
She is anxious and appears tachycardic She will continue antianxiety medicine as directed

## 2020-09-25 NOTE — Assessment & Plan Note (Addendum)
She has completed cycle 1 of treatment, complicated by severe anemia and diarrhea, both resolved  However, she has persistent anemia, anxiety and hypomagnesemia continue aggressive supportive care Overall, I felt that she is responding well to treatment Recommend minimum 3 cycles of therapy before repeat imaging study

## 2020-09-25 NOTE — Assessment & Plan Note (Signed)
She had fluctuation of her serum creatinine I will adjust the dose of chemotherapy accordingly

## 2020-09-25 NOTE — Patient Instructions (Signed)
[facility]  Discharge Instructions: Thank you for choosing [facility] to provide your oncology and hematology care.   If you have a lab appointment with the [facility], please go directly to the [facility] and check in at the registration area.   Wear comfortable clothing and clothing appropriate for easy access to any Portacath or PICC line.   We strive to give you quality time with your provider. You may need to reschedule your appointment if you arrive late (15 or more minutes).  Arriving late affects you and other patients whose appointments are after yours.  Also, if you miss three or more appointments without notifying the office, you may be dismissed from the clinic at the provider's discretion.      For prescription refill requests, have your pharmacy contact our office and allow 72 hours for refills to be completed.    Today you received the following chemotherapy and/or immunotherapy agents: Paclitaxel, Carboplatin     To help prevent nausea and vomiting after your treatment, we encourage you to take your nausea medication as directed.  BELOW ARE SYMPTOMS THAT SHOULD BE REPORTED IMMEDIATELY: . *FEVER GREATER THAN 100.4 F (38 C) OR HIGHER . *CHILLS OR SWEATING . *NAUSEA AND VOMITING THAT IS NOT CONTROLLED WITH YOUR NAUSEA MEDICATION . *UNUSUAL SHORTNESS OF BREATH . *UNUSUAL BRUISING OR BLEEDING . *URINARY PROBLEMS (pain or burning when urinating, or frequent urination) . *BOWEL PROBLEMS (unusual diarrhea, constipation, pain near the anus) . TENDERNESS IN MOUTH AND THROAT WITH OR WITHOUT PRESENCE OF ULCERS (sore throat, sores in mouth, or a toothache) . UNUSUAL RASH, SWELLING OR PAIN  . UNUSUAL VAGINAL DISCHARGE OR ITCHING   Items with * indicate a potential emergency and should be followed up as soon as possible or go to the Emergency Department if any problems should occur.  Please show the CHEMOTHERAPY ALERT CARD or  IMMUNOTHERAPY ALERT CARD at check-in to the Emergency Department and triage nurse.  Should you have questions after your visit or need to cancel or reschedule your appointment, please contact [facility]  Dept: [phone]  and follow the prompts.  Office hours are 8:00 a.m. to 4:30 p.m. Monday - Friday. Please note that voicemails left after 4:00 p.m. may not be returned until the following business day.  We are closed weekends and major holidays. You have access to a nurse at all times for urgent questions. Please call the main number to the clinic Dept: [phone] and follow the prompts.   For any non-urgent questions, you may also contact your provider using MyChart. We now offer e-Visits for anyone 76 and older to request care online for non-urgent symptoms. For details visit [email or web address].   Also download the MyChart app! Go to the app store, search "MyChart", open the app, select [facility], and log in with your MyChart username and password.  Due to Covid, a mask is required upon entering the hospital/clinic. If you do not have a mask, one will be given to you upon arrival. For doctor visits, patients may have 1 support person aged 60 or older with them. For treatment visits, patients cannot have anyone with them due to current Covid guidelines and our immunocompromised population.   Paclitaxel injection What is this medicine? PACLITAXEL (PAK li TAX el) is a chemotherapy drug. It targets fast dividing cells, like cancer cells, and causes these cells to die. This medicine is used to treat ovarian cancer, breast cancer,  lung cancer, Kaposi's sarcoma, and other cancers. This medicine may be used for other purposes; ask your health care provider or pharmacist if you have questions. COMMON BRAND NAME(S): Onxol, Taxol What should I tell my health care provider before I take this medicine? They need to know if you have any of these conditions:  history  of irregular heartbeat  liver disease  low blood counts, like low white cell, platelet, or red cell counts  lung or breathing disease, like asthma  tingling of the fingers or toes, or other nerve disorder  an unusual or allergic reaction to paclitaxel, alcohol, polyoxyethylated castor oil, other chemotherapy, other medicines, foods, dyes, or preservatives  pregnant or trying to get pregnant  breast-feeding How should I use this medicine? This drug is given as an infusion into a vein. It is administered in a hospital or clinic by a specially trained health care professional. Talk to your pediatrician regarding the use of this medicine in children. Special care may be needed. Overdosage: If you think you have taken too much of this medicine contact a poison control center or emergency room at once. NOTE: This medicine is only for you. Do not share this medicine with others. What if I miss a dose? It is important not to miss your dose. Call your doctor or health care professional if you are unable to keep an appointment. What may interact with this medicine? Do not take this medicine with any of the following medications:  live virus vaccines This medicine may also interact with the following medications:  antiviral medicines for hepatitis, HIV or AIDS  certain antibiotics like erythromycin and clarithromycin  certain medicines for fungal infections like ketoconazole and itraconazole  certain medicines for seizures like carbamazepine, phenobarbital, phenytoin  gemfibrozil  nefazodone  rifampin  St. John's wort This list may not describe all possible interactions. Give your health care provider a list of all the medicines, herbs, non-prescription drugs, or dietary supplements you use. Also tell them if you smoke, drink alcohol, or use illegal drugs. Some items may interact with your medicine. What should I watch for while using this medicine? Your condition will be monitored  carefully while you are receiving this medicine. You will need important blood work done while you are taking this medicine. This medicine can cause serious allergic reactions. To reduce your risk you will need to take other medicine(s) before treatment with this medicine. If you experience allergic reactions like skin rash, itching or hives, swelling of the face, lips, or tongue, tell your doctor or health care professional right away. In some cases, you may be given additional medicines to help with side effects. Follow all directions for their use. This drug may make you feel generally unwell. This is not uncommon, as chemotherapy can affect healthy cells as well as cancer cells. Report any side effects. Continue your course of treatment even though you feel ill unless your doctor tells you to stop. Call your doctor or health care professional for advice if you get a fever, chills or sore throat, or other symptoms of a cold or flu. Do not treat yourself. This drug decreases your body's ability to fight infections. Try to avoid being around people who are sick. This medicine may increase your risk to bruise or bleed. Call your doctor or health care professional if you notice any unusual bleeding. Be careful brushing and flossing your teeth or using a toothpick because you may get an infection or bleed more easily. If you  have any dental work done, tell your dentist you are receiving this medicine. Avoid taking products that contain aspirin, acetaminophen, ibuprofen, naproxen, or ketoprofen unless instructed by your doctor. These medicines may hide a fever. Do not become pregnant while taking this medicine. Women should inform their doctor if they wish to become pregnant or think they might be pregnant. There is a potential for serious side effects to an unborn child. Talk to your health care professional or pharmacist for more information. Do not breast-feed an infant while taking this medicine. Men are  advised not to father a child while receiving this medicine. This product may contain alcohol. Ask your pharmacist or healthcare provider if this medicine contains alcohol. Be sure to tell all healthcare providers you are taking this medicine. Certain medicines, like metronidazole and disulfiram, can cause an unpleasant reaction when taken with alcohol. The reaction includes flushing, headache, nausea, vomiting, sweating, and increased thirst. The reaction can last from 30 minutes to several hours. What side effects may I notice from receiving this medicine? Side effects that you should report to your doctor or health care professional as soon as possible:  allergic reactions like skin rash, itching or hives, swelling of the face, lips, or tongue  breathing problems  changes in vision  fast, irregular heartbeat  high or low blood pressure  mouth sores  pain, tingling, numbness in the hands or feet  signs of decreased platelets or bleeding - bruising, pinpoint red spots on the skin, black, tarry stools, blood in the urine  signs of decreased red blood cells - unusually weak or tired, feeling faint or lightheaded, falls  signs of infection - fever or chills, cough, sore throat, pain or difficulty passing urine  signs and symptoms of liver injury like dark yellow or brown urine; general ill feeling or flu-like symptoms; light-colored stools; loss of appetite; nausea; right upper belly pain; unusually weak or tired; yellowing of the eyes or skin  swelling of the ankles, feet, hands  unusually slow heartbeat Side effects that usually do not require medical attention (report to your doctor or health care professional if they continue or are bothersome):  diarrhea  hair loss  loss of appetite  muscle or joint pain  nausea, vomiting  pain, redness, or irritation at site where injected  tiredness This list may not describe all possible side effects. Call your doctor for medical  advice about side effects. You may report side effects to FDA at 1-800-FDA-1088. Where should I keep my medicine? This drug is given in a hospital or clinic and will not be stored at home. NOTE: This sheet is a summary. It may not cover all possible information. If you have questions about this medicine, talk to your doctor, pharmacist, or health care provider.  2021 Elsevier/Gold Standard (2019-04-07 13:37:23)  Carboplatin injection What is this medicine? CARBOPLATIN (KAR boe pla tin) is a chemotherapy drug. It targets fast dividing cells, like cancer cells, and causes these cells to die. This medicine is used to treat ovarian cancer and many other cancers. This medicine may be used for other purposes; ask your health care provider or pharmacist if you have questions. COMMON BRAND NAME(S): Paraplatin What should I tell my health care provider before I take this medicine? They need to know if you have any of these conditions:  blood disorders  hearing problems  kidney disease  recent or ongoing radiation therapy  an unusual or allergic reaction to carboplatin, cisplatin, other chemotherapy, other medicines, foods,  dyes, or preservatives  pregnant or trying to get pregnant  breast-feeding How should I use this medicine? This drug is usually given as an infusion into a vein. It is administered in a hospital or clinic by a specially trained health care professional. Talk to your pediatrician regarding the use of this medicine in children. Special care may be needed. Overdosage: If you think you have taken too much of this medicine contact a poison control center or emergency room at once. NOTE: This medicine is only for you. Do not share this medicine with others. What if I miss a dose? It is important not to miss a dose. Call your doctor or health care professional if you are unable to keep an appointment. What may interact with this medicine?  medicines for seizures  medicines  to increase blood counts like filgrastim, pegfilgrastim, sargramostim  some antibiotics like amikacin, gentamicin, neomycin, streptomycin, tobramycin  vaccines Talk to your doctor or health care professional before taking any of these medicines:  acetaminophen  aspirin  ibuprofen  ketoprofen  naproxen This list may not describe all possible interactions. Give your health care provider a list of all the medicines, herbs, non-prescription drugs, or dietary supplements you use. Also tell them if you smoke, drink alcohol, or use illegal drugs. Some items may interact with your medicine. What should I watch for while using this medicine? Your condition will be monitored carefully while you are receiving this medicine. You will need important blood work done while you are taking this medicine. This drug may make you feel generally unwell. This is not uncommon, as chemotherapy can affect healthy cells as well as cancer cells. Report any side effects. Continue your course of treatment even though you feel ill unless your doctor tells you to stop. In some cases, you may be given additional medicines to help with side effects. Follow all directions for their use. Call your doctor or health care professional for advice if you get a fever, chills or sore throat, or other symptoms of a cold or flu. Do not treat yourself. This drug decreases your body's ability to fight infections. Try to avoid being around people who are sick. This medicine may increase your risk to bruise or bleed. Call your doctor or health care professional if you notice any unusual bleeding. Be careful brushing and flossing your teeth or using a toothpick because you may get an infection or bleed more easily. If you have any dental work done, tell your dentist you are receiving this medicine. Avoid taking products that contain aspirin, acetaminophen, ibuprofen, naproxen, or ketoprofen unless instructed by your doctor. These medicines  may hide a fever. Do not become pregnant while taking this medicine. Women should inform their doctor if they wish to become pregnant or think they might be pregnant. There is a potential for serious side effects to an unborn child. Talk to your health care professional or pharmacist for more information. Do not breast-feed an infant while taking this medicine. What side effects may I notice from receiving this medicine? Side effects that you should report to your doctor or health care professional as soon as possible:  allergic reactions like skin rash, itching or hives, swelling of the face, lips, or tongue  signs of infection - fever or chills, cough, sore throat, pain or difficulty passing urine  signs of decreased platelets or bleeding - bruising, pinpoint red spots on the skin, black, tarry stools, nosebleeds  signs of decreased red blood cells - unusually  weak or tired, fainting spells, lightheadedness  breathing problems  changes in hearing  changes in vision  chest pain  high blood pressure  low blood counts - This drug may decrease the number of white blood cells, red blood cells and platelets. You may be at increased risk for infections and bleeding.  nausea and vomiting  pain, swelling, redness or irritation at the injection site  pain, tingling, numbness in the hands or feet  problems with balance, talking, walking  trouble passing urine or change in the amount of urine Side effects that usually do not require medical attention (report to your doctor or health care professional if they continue or are bothersome):  hair loss  loss of appetite  metallic taste in the mouth or changes in taste This list may not describe all possible side effects. Call your doctor for medical advice about side effects. You may report side effects to FDA at 1-800-FDA-1088. Where should I keep my medicine? This drug is given in a hospital or clinic and will not be stored at  home. NOTE: This sheet is a summary. It may not cover all possible information. If you have questions about this medicine, talk to your doctor, pharmacist, or health care provider.  2021 Elsevier/Gold Standard (2007-08-11 14:38:05)   Hypomagnesemia Hypomagnesemia is a condition in which the level of magnesium in the blood is low. Magnesium is a mineral that is found in many foods. It is used in many different processes in the body. Hypomagnesemia can affect every organ in the body. In severe cases, it can cause life-threatening problems. What are the causes? This condition may be caused by:  Not getting enough magnesium in your diet.  Malnutrition.  Problems with absorbing magnesium from the intestines.  Dehydration.  Alcohol abuse.  Vomiting.  Severe or chronic diarrhea.  Some medicines, including medicines that make you urinate more (diuretics).  Certain diseases, such as kidney disease, diabetes, celiac disease, and overactive thyroid. What are the signs or symptoms? Symptoms of this condition include:  Loss of appetite.  Nausea and vomiting.  Involuntary shaking or trembling of a body part (tremor).  Muscle weakness.  Tingling in the arms and legs.  Sudden tightening of muscles (muscle spasms).  Confusion.  Psychiatric issues, such as depression, irritability, or psychosis.  A feeling of fluttering of the heart.  Seizures. These symptoms are more severe if magnesium levels drop suddenly. How is this diagnosed? This condition may be diagnosed based on:  Your symptoms and medical history.  A physical exam.  Blood and urine tests. How is this treated? Treatment depends on the cause and the severity of the condition. It may be treated with:  A magnesium supplement. This can be taken in pill form. If the condition is severe, magnesium is usually given through an IV.  Changes to your diet. You may be directed to eat foods that have a lot of magnesium,  such as green leafy vegetables, peas, beans, and nuts.  Stopping any intake of alcohol.   Follow these instructions at home:  Make sure that your diet includes foods with magnesium. Foods that have a lot of magnesium in them include: ? Green leafy vegetables, such as spinach and broccoli. ? Beans and peas. ? Nuts and seeds, such as almonds and sunflower seeds. ? Whole grains, such as whole grain bread and fortified cereals.  Take magnesium supplements if your health care provider tells you to do that. Take them as directed.  Take over-the-counter and  prescription medicines only as told by your health care provider.  Have your magnesium levels monitored as told by your health care provider.  When you are active, drink fluids that contain electrolytes.  Avoid drinking alcohol.  Keep all follow-up visits as told by your health care provider. This is important.      Contact a health care provider if:  You get worse instead of better.  Your symptoms return. Get help right away if you:  Develop severe muscle weakness.  Have trouble breathing.  Feel that your heart is racing. Summary  Hypomagnesemia is a condition in which the level of magnesium in the blood is low.  Hypomagnesemia can affect every organ in the body.  Treatment may include eating more foods that contain magnesium, taking magnesium supplements, and not drinking alcohol.  Have your magnesium levels monitored as told by your health care provider. This information is not intended to replace advice given to you by your health care provider. Make sure you discuss any questions you have with your health care provider. Document Revised: 10/07/2019 Document Reviewed: 10/07/2019 Elsevier Patient Education  [place].

## 2020-09-25 NOTE — Progress Notes (Signed)
Okay to treat with elevated HR per Dr. Alvy Bimler

## 2020-09-25 NOTE — Assessment & Plan Note (Signed)
She has multifactorial anemia, combination of thalassemia and recent anemia chronic illness She received blood transfusion and her anemia is stable I am concerned about worsening anemia in the future and will reduce carboplatin a little bit

## 2020-09-25 NOTE — Assessment & Plan Note (Signed)
She has significant hypomagnesemia I will order replacement therapy

## 2020-10-02 ENCOUNTER — Telehealth: Payer: Self-pay

## 2020-10-02 NOTE — Telephone Encounter (Signed)
OK, stable No change Tell her to continue monitoring; also add HR readings

## 2020-10-02 NOTE — Telephone Encounter (Signed)
Patient notified, verbalized understanding and agreement.

## 2020-10-02 NOTE — Telephone Encounter (Signed)
Patient called to report BP readings as requested.   5/12               137/80 5/13               135/90 5/14               152/98 5/15               148/90 5/16               150/80  Patient is taking Amlodipine 10 mg as directed.

## 2020-10-04 ENCOUNTER — Telehealth: Payer: Self-pay | Admitting: Hematology and Oncology

## 2020-10-04 NOTE — Telephone Encounter (Signed)
Rescheduled appointment per provider. Patient is aware. 

## 2020-10-09 NOTE — Discharge Summary (Signed)
Physician Discharge Summary  Patient ID: Teresa Oliver MRN: [id number] DOB/AGE: 1946/02/28 75 y.o.  Admit date: 08/28/2020 Discharge date: 09/04/2020  Admission Diagnoses:  Discharge Diagnoses:  Principal Problem:   -Pelvic mass   -Poorly differentiated carcinoma of gynecologic origin, likely metastatic, with partial sigmoid colon obstruction. Active Problems:   Hydroureteronephrosis   Hypokalemia   Hypomagnesemia   Hyponatremia   Hyperglycemia   AKI (acute kidney injury) ([place])   Malnutrition of moderate degree   Abdominal pain, generalized   Microcytic anemia, likely secondary to thalassemia minor.   Discharged Condition: stable  Hospital Course: Patient is a 75 year old female with past medical history significant for thalassemia minor, type 2 diabetes mellitus, hypertension, hyperlipidemia and irritable bowel syndrome.  Patient initially presented to [place] [facility] upon the advice of her GI doctor after being found to have electrolyte abnormalities on outpatient labs.  The patient also had decreased urinary stream, constipation, anorexia, weight loss.  On admission, her WBC was 15.6, hemoglobin 8.7, MCV 63.7, platelets 435,000, sodium 117, potassium 2.6, creatinine 1.22, ferritin of 771, iron 31, TIBC 218, percent saturation 14%, folate of 7.2 and vitamin B12 of 978. CT of the abdomen and pelvis without contrast revealed a pelvic mass measuring 7.5 x 4.8 cm contiguous with the sigmoid colon and uterus, pelvic mass causing partial obstruction of the left ureter with mild left hydroureteronephrosis, wall thickening of the sigmoid colon distal to the left pelvic mass with pericolonic edema suspicious for colitis.  The patient had a CT-guided biopsy performed on 08/30/2020 which showed a poorly differentiated carcinoma with necrosis.  She had a colonoscopy performed on 08/31/2020 which showed a large mass causing extrinsic compression and partial obstruction of the sigmoid colon.  Mass  was thought to be metastatic, from pelvic malignancy.  She had tumor markers obtained on 08/29/2020 and her CA 125 was 966, CEA less than 0.3, CA 19.9 was 7.    Pelvic mass with multifocal adenopathy Mild left hydroureteronephrosis Partial obstruction of sigmoid colon -Presented with constipation, poor oral intake, abnormal electrolytes -CT abdomen pelvis showed pelvic mass with multifocal adenopathy with secondary compression to the left ureter as well as bowel. -GYN oncology, Oncology, urology and GI assisted in directing patient's care.   -On 08/30/20, patient underwent biopsy of the mass by IR.   -On 08/31/20, patient underwent colonoscopy and biopsy of the sigmoid colon. -Patient is thought to have metastatic disease of unknown primary causing partial obstruction of the left ureter and sigmoid colon.  Per GYN oncology, patient may eventually need a left nephrostomy tube as well as colonic diversion. -Patient was started on chemotherapy on the day of discharge and will follow up with Oncology on discharge.  Constipation History of IBS -Delayed colonic transit due to compression from pelvic mass -Senokot scheduled.  MiraLAX as needed.  Elevated creatinine -No baseline available from past. -Presented with creatinine of 1.22. Roughly stable. Recent Labs (within last 365 days)            Recent Labs    08/28/20 1648 08/28/20 2138 08/29/20 0538 08/29/20 1307 08/30/20 0653 08/31/20 0533 09/01/20 0519 09/02/20 0653  BUN 18 17 16 15 14 14 10 15  16   CREATININE 1.22* 1.24* 1.28* 1.20* 1.15* 1.16* 1.17* 1.13*  1.02*     Hyponatremia -Likely hypovolemic due to poor oral intake. But also suspected SIADH because of coexisting malignancy.  Last Labs             Recent Labs  Lab 08/28/20  1648 08/28/20 2138 08/29/20 0538 08/29/20 1307 08/30/20 0653 08/31/20 0533 09/01/20 0519 09/02/20 0653  NA 117* 120* 119* 120* 118* 125* 125* 129*  128*      Hypokalemia/hypomagnesemia/hypophosphatemia -Low levels of potassium, magnesium and phosphorus due to poor oral intake -Aggressively replaced with improvement. Last Labs           Recent Labs  Lab 08/29/20 0538 08/29/20 1307 08/30/20 0653 08/31/20 0533 09/01/20 0519 09/02/20 0653  K 4.0 4.2 4.0 3.7 3.9 3.6  3.6  MG 2.2  --  1.7 1.7 1.5* 1.8  PHOS 1.8*  --  3.1 2.8 2.6 2.3*  2.3*     Acute metabolic encephalopathy -Patient cheerful but remains confused, unable to recollect recent details. -Multifactorial: Poor oral intake, abnormal electrolytes, malignancy -Continue to monitor mental status.  Hypertension History of essential hypertension-On losartan and hydrochlorothiazide at home.  Currently on hold because of low blood pressure -Continue to monitor blood pressure.  Diabetes mellitus type II -Hemoglobin A1c 7 on 08/29/2020.  Last Labs          Recent Labs  Lab 09/01/20 1209 09/01/20 1722 09/01/20 2239 09/02/20 0529 09/02/20 1207  GLUCAP 115* 130* 130* 119* 136*     Hyperlipidemia -On pravastatin.  Consults: GI, hematology/oncology, general surgery, interventional radiology and urology  Discharge Exam: Blood pressure 131/85, pulse 100, temperature 99.1 F (37.3 C), temperature source Oral, resp. rate 16, height 5' (1.524 m), weight 51.1 kg, SpO2 99 %.  Disposition: Discharge disposition: 01-Home or Self Care   Discharge Instructions    Diet - low sodium heart healthy   Complete by: As directed    Discharge wound care:   Complete by: As directed    Continue current wound care   Increase activity slowly   Complete by: As directed    TREATMENT CONDITIONS   Complete by: As directed    Patient should have CBC & CMP within 7 days prior to chemotherapy administration. NOTIFY MD IF: ANC < 1500, Hemoglobin < 8, PLT < 100,000,  Total Bili > 1.5, Creatinine > 1.5, ALT & AST > 80 or if patient has unstable vital signs: Temperature > 38.5, SBP > 180 or <  90, RR > 30 or HR > 100.     Allergies as of 09/04/2020   No Known Allergies     Medication List    STOP taking these medications   losartan-hydrochlorothiazide 100-25 MG tablet Commonly known as: HYZAAR   pantoprazole 40 MG tablet Commonly known as: PROTONIX   pravastatin 80 MG tablet Commonly known as: PRAVACHOL     TAKE these medications   cholecalciferol 25 MCG (1000 UNIT) tablet Commonly known as: VITAMIN D3 Take 1,000 Units by mouth daily.   oxybutynin 5 MG tablet Commonly known as: DITROPAN Take 1 tablet (5 mg total) by mouth every 8 (eight) hours as needed for bladder spasms.   polyethylene glycol 17 g packet Commonly known as: MIRALAX / GLYCOLAX Take 17 g by mouth 2 (two) times daily as needed for moderate constipation.   senna-docusate 8.6-50 MG tablet Commonly known as: Senokot-S Take 1 tablet by mouth 2 (two) times daily.     ASK your doctor about these medications   feeding supplement (KATE FARMS STANDARD 1.4) Liqd liquid Take 325 mLs by mouth 2 (two) times daily between meals. Ask about: Should I take this medication?            Discharge Care Instructions  (From admission, onward)  Start     Ordered   09/04/20 0000  Discharge wound care:       Comments: Continue current wound care   09/04/20 1614           Signed: Bonnell Public 10/09/2020, 11:19 PM

## 2020-10-11 ENCOUNTER — Other Ambulatory Visit: Payer: Self-pay | Admitting: Internal Medicine

## 2020-10-18 ENCOUNTER — Ambulatory Visit: Payer: Medicare Other | Admitting: Hematology and Oncology

## 2020-10-18 ENCOUNTER — Ambulatory Visit: Payer: Medicare Other

## 2020-10-18 ENCOUNTER — Other Ambulatory Visit: Payer: Medicare Other

## 2020-10-24 ENCOUNTER — Other Ambulatory Visit: Payer: Self-pay | Admitting: Hematology and Oncology

## 2020-10-27 ENCOUNTER — Inpatient Hospital Stay: Payer: Medicare Other | Admitting: Hematology and Oncology

## 2020-10-27 ENCOUNTER — Inpatient Hospital Stay: Payer: Medicare Other

## 2020-10-27 ENCOUNTER — Encounter: Payer: Self-pay | Admitting: Hematology and Oncology

## 2020-10-27 ENCOUNTER — Other Ambulatory Visit: Payer: Self-pay

## 2020-10-27 ENCOUNTER — Inpatient Hospital Stay: Payer: Medicare Other | Attending: Hematology and Oncology

## 2020-10-27 VITALS — BP 155/79 | HR 98 | Temp 98.2°F | Resp 17

## 2020-10-27 VITALS — BP 175/81 | HR 115 | Temp 98.0°F | Resp 18 | Ht 60.0 in | Wt 111.4 lb

## 2020-10-27 DIAGNOSIS — R590 Localized enlarged lymph nodes: Secondary | ICD-10-CM | POA: Insufficient documentation

## 2020-10-27 DIAGNOSIS — Z5111 Encounter for antineoplastic chemotherapy: Secondary | ICD-10-CM | POA: Diagnosis not present

## 2020-10-27 DIAGNOSIS — R5383 Other fatigue: Secondary | ICD-10-CM | POA: Insufficient documentation

## 2020-10-27 DIAGNOSIS — C549 Malignant neoplasm of corpus uteri, unspecified: Secondary | ICD-10-CM | POA: Diagnosis not present

## 2020-10-27 DIAGNOSIS — D539 Nutritional anemia, unspecified: Secondary | ICD-10-CM | POA: Diagnosis not present

## 2020-10-27 DIAGNOSIS — Z79899 Other long term (current) drug therapy: Secondary | ICD-10-CM | POA: Diagnosis not present

## 2020-10-27 DIAGNOSIS — I10 Essential (primary) hypertension: Secondary | ICD-10-CM | POA: Insufficient documentation

## 2020-10-27 DIAGNOSIS — N183 Chronic kidney disease, stage 3 unspecified: Secondary | ICD-10-CM | POA: Diagnosis not present

## 2020-10-27 DIAGNOSIS — K449 Diaphragmatic hernia without obstruction or gangrene: Secondary | ICD-10-CM | POA: Insufficient documentation

## 2020-10-27 DIAGNOSIS — N179 Acute kidney failure, unspecified: Secondary | ICD-10-CM

## 2020-10-27 DIAGNOSIS — F419 Anxiety disorder, unspecified: Secondary | ICD-10-CM | POA: Insufficient documentation

## 2020-10-27 LAB — CBC WITH DIFFERENTIAL/PLATELET
Abs Immature Granulocytes: 0.04 10*3/uL (ref 0.00–0.07)
Basophils Absolute: 0 10*3/uL (ref 0.0–0.1)
Basophils Relative: 0 %
Eosinophils Absolute: 0 10*3/uL (ref 0.0–0.5)
Eosinophils Relative: 0 %
HCT: 25.5 % — ABNORMAL LOW (ref 36.0–46.0)
Hemoglobin: 8 g/dL — ABNORMAL LOW (ref 12.0–15.0)
Immature Granulocytes: 1 %
Lymphocytes Relative: 14 %
Lymphs Abs: 0.9 10*3/uL (ref 0.7–4.0)
MCH: 21.9 pg — ABNORMAL LOW (ref 26.0–34.0)
MCHC: 31.4 g/dL (ref 30.0–36.0)
MCV: 69.7 fL — ABNORMAL LOW (ref 80.0–100.0)
Monocytes Absolute: 0.2 10*3/uL (ref 0.1–1.0)
Monocytes Relative: 3 %
Neutro Abs: 5.5 10*3/uL (ref 1.7–7.7)
Neutrophils Relative %: 82 %
Platelets: 253 10*3/uL (ref 150–400)
RBC: 3.66 MIL/uL — ABNORMAL LOW (ref 3.87–5.11)
RDW: 20.7 % — ABNORMAL HIGH (ref 11.5–15.5)
WBC: 6.6 10*3/uL (ref 4.0–10.5)
nRBC: 0 % (ref 0.0–0.2)

## 2020-10-27 LAB — COMPREHENSIVE METABOLIC PANEL
ALT: 9 U/L (ref 0–44)
AST: 15 U/L (ref 15–41)
Albumin: 3.7 g/dL (ref 3.5–5.0)
Alkaline Phosphatase: 93 U/L (ref 38–126)
Anion gap: 13 (ref 5–15)
BUN: 37 mg/dL — ABNORMAL HIGH (ref 8–23)
CO2: 21 mmol/L — ABNORMAL LOW (ref 22–32)
Calcium: 10 mg/dL (ref 8.9–10.3)
Chloride: 103 mmol/L (ref 98–111)
Creatinine, Ser: 1.31 mg/dL — ABNORMAL HIGH (ref 0.44–1.00)
GFR, Estimated: 43 mL/min — ABNORMAL LOW (ref >60)
Glucose, Bld: 128 mg/dL — ABNORMAL HIGH (ref 70–99)
Potassium: 4.4 mmol/L (ref 3.5–5.1)
Sodium: 137 mmol/L (ref 135–145)
Total Bilirubin: 0.4 mg/dL (ref 0.3–1.2)
Total Protein: 8.1 g/dL (ref 6.5–8.1)

## 2020-10-27 LAB — SAMPLE TO BLOOD BANK

## 2020-10-27 LAB — MAGNESIUM: Magnesium: 1.6 mg/dL — ABNORMAL LOW (ref 1.7–2.4)

## 2020-10-27 MED ORDER — DIPHENHYDRAMINE HCL 50 MG/ML IJ SOLN
12.5000 mg | Freq: Once | INTRAMUSCULAR | Status: AC
  Administered 2020-10-27: 12.5 mg via INTRAVENOUS

## 2020-10-27 MED ORDER — DIPHENHYDRAMINE HCL 50 MG/ML IJ SOLN
INTRAMUSCULAR | Status: AC
  Filled 2020-10-27: qty 1

## 2020-10-27 MED ORDER — ATENOLOL 50 MG PO TABS: 50.0000 mg | ORAL_TABLET | Freq: Every day | ORAL | 2 refills | Status: DC

## 2020-10-27 MED ORDER — HEPARIN SOD (PORK) LOCK FLUSH 100 UNIT/ML IV SOLN
500.0000 [IU] | Freq: Once | INTRAVENOUS | Status: AC | PRN
  Administered 2020-10-27: 500 [IU]
  Filled 2020-10-27: qty 5

## 2020-10-27 MED ORDER — MAGNESIUM OXIDE -MG SUPPLEMENT 400 (240 MG) MG PO TABS: 400.0000 mg | ORAL_TABLET | Freq: Two times a day (BID) | ORAL | 1 refills | Status: DC

## 2020-10-27 MED ORDER — FAMOTIDINE 20 MG IN NS 100 ML IVPB
INTRAVENOUS | Status: AC
  Filled 2020-10-27: qty 100

## 2020-10-27 MED ORDER — SODIUM CHLORIDE 0.9 % IV SOLN
175.0000 mg/m2 | Freq: Once | INTRAVENOUS | Status: AC
  Administered 2020-10-27: 252 mg via INTRAVENOUS
  Filled 2020-10-27: qty 42

## 2020-10-27 MED ORDER — PALONOSETRON HCL INJECTION 0.25 MG/5ML
0.2500 mg | Freq: Once | INTRAVENOUS | Status: AC
  Administered 2020-10-27: 0.25 mg via INTRAVENOUS

## 2020-10-27 MED ORDER — SODIUM CHLORIDE 0.9 % IV SOLN
10.0000 mg | Freq: Once | INTRAVENOUS | Status: AC
  Administered 2020-10-27: 10 mg via INTRAVENOUS
  Filled 2020-10-27: qty 10

## 2020-10-27 MED ORDER — SODIUM CHLORIDE 0.9% FLUSH
10.0000 mL | INTRAVENOUS | Status: DC | PRN
  Administered 2020-10-27: 10 mL
  Filled 2020-10-27: qty 10

## 2020-10-27 MED ORDER — FAMOTIDINE 20 MG IN NS 100 ML IVPB
20.0000 mg | Freq: Once | INTRAVENOUS | Status: AC
  Administered 2020-10-27: 20 mg via INTRAVENOUS

## 2020-10-27 MED ORDER — SODIUM CHLORIDE 0.9 % IV SOLN
Freq: Once | INTRAVENOUS | Status: AC
Start: 2020-10-27 — End: 2020-10-27
  Filled 2020-10-27: qty 250

## 2020-10-27 MED ORDER — PALONOSETRON HCL INJECTION 0.25 MG/5ML
INTRAVENOUS | Status: AC
  Filled 2020-10-27: qty 5

## 2020-10-27 MED ORDER — SODIUM CHLORIDE 0.9 % IV SOLN
242.5500 mg | Freq: Once | INTRAVENOUS | Status: AC
  Administered 2020-10-27: 240 mg via INTRAVENOUS
  Filled 2020-10-27: qty 24

## 2020-10-27 MED ORDER — SODIUM CHLORIDE 0.9 % IV SOLN
150.0000 mg | Freq: Once | INTRAVENOUS | Status: AC
  Administered 2020-10-27: 150 mg via INTRAVENOUS
  Filled 2020-10-27: qty 150

## 2020-10-27 NOTE — Progress Notes (Signed)
Calculated dose of Carboplatin ~ 245 mg. Spoke with MD Alvy Bimler and she would like to reduce dose to calculated dose.

## 2020-10-27 NOTE — Assessment & Plan Note (Signed)
She has multifactorial anemia, combination of thalassemia and recent anemia chronic illness She had received blood transfusion and her anemia is stable I am concerned about worsening anemia in the future and will bring her back for repeat blood work in about 10 days and transfusion if hemoglobin is less than 8

## 2020-10-27 NOTE — Assessment & Plan Note (Signed)
Clinically, she appears to be doing very well I suspect she has good response to treatment We will proceed with chemotherapy as scheduled I plan to repeat imaging study at the end of the month Due to her severe anemia, she will return in 10 days for hemoglobin check and transfusion as needed

## 2020-10-27 NOTE — Progress Notes (Signed)
[place] OFFICE PROGRESS NOTE  Patient Care Team: Jani Gravel, MD as PCP - General (Internal Medicine) [name], RN as Oncology Nurse Navigator (Oncology)  ASSESSMENT & PLAN:  Uterine cancer [facility]) Clinically, she appears to be doing very well I suspect she has good response to treatment We will proceed with chemotherapy as scheduled I plan to repeat imaging study at the end of the month Due to her severe anemia, she will return in 10 days for hemoglobin check and transfusion as needed  CKD (chronic kidney disease), stage III ([place]) She had fluctuation of her serum creatinine I will adjust the dose of chemotherapy accordingly  Deficiency anemia She has multifactorial anemia, combination of thalassemia and recent anemia chronic illness She had received blood transfusion and her anemia is stable I am concerned about worsening anemia in the future and will bring her back for repeat blood work in about 10 days and transfusion if hemoglobin is less than 8  Hypomagnesemia She has significant hypomagnesemia, improved since recent aggressive replacement therapy I recommend continue oral magnesium  Essential hypertension Her blood pressure is suboptimally controlled I recommend adding beta-blocker She will continue to check her blood pressure daily  Orders Placed This Encounter  Procedures   CT CHEST ABDOMEN PELVIS W CONTRAST    Standing Status:   Future    Standing Expiration Date:   10/27/2021    Order Specific Question:   Preferred imaging location?    Answer:   [facility]    Order Specific Question:   Radiology Contrast Protocol - do NOT remove file path    Answer:   [email or web address]    All questions were answered. The patient knows to call the clinic with any problems, questions or concerns. The total time spent in the appointment was 30 minutes encounter with patients including review of chart and various  tests results, discussions about plan of care and coordination of care plan   [name], MD 10/27/2020 11:42 AM  INTERVAL HISTORY: Please see below for problem oriented charting. She returns for a cycle 3 of chemotherapy She is eating better and has gained a lot of weight Her bowel movement is now regular Denies nausea or vomiting Her energy level is fair She has very mild neuropathy but does not bother her Overall, she tolerated last cycle of treatment better The patient denies any recent signs or symptoms of bleeding such as spontaneous epistaxis, hematuria or hematochezia.  SUMMARY OF ONCOLOGIC HISTORY: Oncology History Overview Note  High grade serous MMR normal Her2 negative   Uterine cancer ([place])  08/28/2020 Imaging   1. Pelvic mass measuring 7.5 x 4.8 cm, contiguous with the sigmoid colon and uterus with loss of fat planes. Site of origin is unclear, may be ovarian, uterine, or colonic. Lack of contrast limits detailed assessment. 2. Above pelvic mass causes partial obstruction of the left ureter with mild left hydroureteronephrosis. 3. Multifocal adenopathy in the abdomen and pelvis, suspicious for metastatic disease, primarily in the mesentery and lower peritoneum and pelvis. Please note that detailed assessment of adenopathy is limited on this noncontrast exam. Recommend oncology referral.  4. Wall thickening of the sigmoid colon distal to the left pelvic mass with pericolonic edema, suspicious for colitis. There is a moderate to large amount of stool in the more proximal colon, suggesting pelvic mass may be causing delayed colonic transit. 5. Small to moderate hiatal hernia.   08/28/2020 - 09/04/2020 Hospital Admission   The patient has been  complaining of reduced urine output, loss of appetite and changes in bowel habits She had abnormal imaging study leading to hospitalization, colonoscopy, biopsy and subsequently chemotherapy   08/30/2020 Procedure   CT-guided biopsy of  enlarged left external iliac chain lymph node as above   08/30/2020 Pathology Results   SURGICAL PATHOLOGY  CASE: [id number]  PATIENT: Teresa Oliver  Surgical Pathology Report  Clinical History: Pelvic mass with metastatic lymph adenopathy ([facility])  FINAL MICROSCOPIC DIAGNOSIS:   A. LYMPH NODE, LEFT EXTERNAL ILIAC CHAIN, BIOPSY:  - Poorly differentiated carcinoma with necrosis.  - See comment.   COMMENT:  The carcinoma is characterized by diffuse sheets of malignant epithelioid cells with prominent nucleoli and frequent mitotic figures. Immunohistochemistry is positive with cytokeratin 7 and PAX 8 with weak  positivity with estrogen receptor and WT1.  P53 shows diffuse strong nuclear positivity.  The carcinoma is negative with cytokeratin 20, CDX2, GATA3, GCDFP, TTF-1, HepPar 1, arginase 1 and CD10.  The  morphology and immunophenotype are most consistent with a gynecologic primary including high-grade serous carcinoma.    08/31/2020 Procedure   Colonoscopy - Preparation of the colon was fair. - Likely malignant partially obstructing tumor in the sigmoid colon (appears extrinsic process). Biopsied. Tattooed. - Internal hemorrhoids. - Stool in the entire examined colon   09/01/2020 Initial Diagnosis   Uterine cancer ([place])    09/01/2020 Procedure   Successful placement of a power injectable Port-A-Cath via the right internal jugular vein. The catheter is ready for immediate use.   09/04/2020 -  Chemotherapy    Patient is on Treatment Plan: UTERINE CARBOPLATIN AUC 6 / PACLITAXEL Q21D       09/07/2020 Cancer Staging   Staging form: Corpus Uteri - Carcinoma and Carcinosarcoma, AJCC 8th Edition - Clinical: FIGO Stage IVA (cT4, cN2a, cM0) - Signed by [name], MD on 09/07/2020      REVIEW OF SYSTEMS:   Constitutional: Denies fevers, chills or abnormal weight loss Eyes: Denies blurriness of vision Ears, nose, mouth, throat, and face: Denies mucositis or sore throat Respiratory:  Denies cough, dyspnea or wheezes Cardiovascular: Denies palpitation, chest discomfort or lower extremity swelling Gastrointestinal:  Denies nausea, heartburn or change in bowel habits Skin: Denies abnormal skin rashes Lymphatics: Denies new lymphadenopathy or easy bruising Behavioral/Psych: Mood is stable, no new changes  All other systems were reviewed with the patient and are negative.  I have reviewed the past medical history, past surgical history, social history and family history with the patient and they are unchanged from previous note.  ALLERGIES:  has No Known Allergies.  MEDICATIONS:  Current Outpatient Medications  Medication Sig Dispense Refill   atenolol (TENORMIN) 50 MG tablet Take 1 tablet (50 mg total) by mouth daily. 30 tablet 2   amLODipine (NORVASC) 10 MG tablet Take 1 tablet (10 mg total) by mouth daily. 30 tablet 2   cholecalciferol (VITAMIN D3) 25 MCG (1000 UNIT) tablet Take 1,000 Units by mouth daily.     dexamethasone (DECADRON) 4 MG tablet Take 2 tabs at the night before and 2 tab the morning of chemotherapy, every 3 weeks, by mouth x 6 cycles 36 tablet 6   lidocaine-prilocaine (EMLA) cream Apply 1 application topically daily as needed. 30 g 3   LORazepam (ATIVAN) 0.5 MG tablet TAKE 1 TABLET(0.5 MG) BY MOUTH TWICE DAILY AS NEEDED FOR ANXIETY 60 tablet 0   magnesium oxide (MAG-OX) 400 (240 Mg) MG tablet Take 1 tablet (400 mg total) by mouth 2 (two) times daily.  60 tablet 1   ondansetron (ZOFRAN) 8 MG tablet Take 1 tablet (8 mg total) by mouth every 8 (eight) hours as needed for nausea. 30 tablet 3   oxybutynin (DITROPAN) 5 MG tablet Take 1 tablet (5 mg total) by mouth every 8 (eight) hours as needed for bladder spasms. 90 tablet 1   polyethylene glycol (MIRALAX / GLYCOLAX) 17 g packet Take 17 g by mouth 2 (two) times daily as needed for moderate constipation. 14 each 0   prochlorperazine (COMPAZINE) 10 MG tablet Take 1 tablet (10 mg total) by mouth every 6 (six)  hours as needed for nausea or vomiting. 30 tablet 1   senna-docusate (SENOKOT-S) 8.6-50 MG tablet Take 1 tablet by mouth 2 (two) times daily. 30 tablet 1   No current facility-administered medications for this visit.   Facility-Administered Medications Ordered in Other Visits  Medication Dose Route Frequency Provider Last Rate Last Admin   CARBOplatin (PARAPLATIN) 240 mg in sodium chloride 0.9 % 100 mL chemo infusion  240 mg Intravenous Once Alvy Bimler, Librado Guandique, MD       dexamethasone (DECADRON) 10 mg in sodium chloride 0.9 % 50 mL IVPB  10 mg Intravenous Once Alvy Bimler, Kandis Henry, MD       diphenhydrAMINE (BENADRYL) injection 12.5 mg  12.5 mg Intravenous Once Alvy Bimler, Wenona Mayville, MD       famotidine (PEPCID) IVPB 20 mg in NS 100 mL IVPB  20 mg Intravenous Once Alvy Bimler, Jobany Montellano, MD       fosaprepitant (EMEND) 150 mg in sodium chloride 0.9 % 145 mL IVPB  150 mg Intravenous Once Alvy Bimler, Kenidi Elenbaas, MD       PACLitaxel (TAXOL) 252 mg in sodium chloride 0.9 % 250 mL chemo infusion (> $RemoveBef'80mg'TELBDDjbgN$ /m2)  175 mg/m2 (Order-Specific) Intravenous Once Alvy Bimler, Reann Dobias, MD       palonosetron (ALOXI) injection 0.25 mg  0.25 mg Intravenous Once Alvy Bimler, Desirey Keahey, MD        PHYSICAL EXAMINATION: ECOG PERFORMANCE STATUS: 1 - Symptomatic but completely ambulatory  Vitals:   10/27/20 1057  BP: (!) 175/81  Pulse: (!) 115  Resp: 18  Temp: 98 F (36.7 C)  SpO2: 100%   Filed Weights   10/27/20 1057  Weight: 111 lb 6.4 oz (50.5 kg)    GENERAL:alert, no distress and comfortable SKIN: skin color, texture, turgor are normal, no rashes or significant lesions EYES: normal, Conjunctiva are pink and non-injected, sclera clear OROPHARYNX:no exudate, no erythema and lips, buccal mucosa, and tongue normal  NECK: supple, thyroid normal size, non-tender, without nodularity LYMPH:  no palpable lymphadenopathy in the cervical, axillary or inguinal LUNGS: clear to auscultation and percussion with normal breathing effort HEART: regular rate & rhythm and no murmurs and  no lower extremity edema ABDOMEN:abdomen soft, non-tender and normal bowel sounds Musculoskeletal:no cyanosis of digits and no clubbing  NEURO: alert & oriented x 3 with fluent speech, no focal motor/sensory deficits  LABORATORY DATA:  I have reviewed the data as listed    Component Value Date/Time   NA 137 10/27/2020 1032   K 4.4 10/27/2020 1032   CL 103 10/27/2020 1032   CO2 21 (L) 10/27/2020 1032   GLUCOSE 128 (H) 10/27/2020 1032   BUN 37 (H) 10/27/2020 1032   CREATININE 1.31 (H) 10/27/2020 1032   CALCIUM 10.0 10/27/2020 1032   PROT 8.1 10/27/2020 1032   ALBUMIN 3.7 10/27/2020 1032   AST 15 10/27/2020 1032   ALT 9 10/27/2020 1032   ALKPHOS 93 10/27/2020 1032   BILITOT 0.4  10/27/2020 1032   GFRNONAA 43 (L) 10/27/2020 1032    No results found for: SPEP, UPEP  Lab Results  Component Value Date   WBC 6.6 10/27/2020   NEUTROABS 5.5 10/27/2020   HGB 8.0 (L) 10/27/2020   HCT 25.5 (L) 10/27/2020   MCV 69.7 (L) 10/27/2020   PLT 253 10/27/2020      Chemistry      Component Value Date/Time   NA 137 10/27/2020 1032   K 4.4 10/27/2020 1032   CL 103 10/27/2020 1032   CO2 21 (L) 10/27/2020 1032   BUN 37 (H) 10/27/2020 1032   CREATININE 1.31 (H) 10/27/2020 1032      Component Value Date/Time   CALCIUM 10.0 10/27/2020 1032   ALKPHOS 93 10/27/2020 1032   AST 15 10/27/2020 1032   ALT 9 10/27/2020 1032   BILITOT 0.4 10/27/2020 1032

## 2020-10-27 NOTE — Assessment & Plan Note (Signed)
Her blood pressure is suboptimally controlled I recommend adding beta-blocker She will continue to check her blood pressure daily

## 2020-10-27 NOTE — Assessment & Plan Note (Signed)
She has significant hypomagnesemia, improved since recent aggressive replacement therapy I recommend continue oral magnesium

## 2020-10-27 NOTE — Assessment & Plan Note (Signed)
She had fluctuation of her serum creatinine I will adjust the dose of chemotherapy accordingly

## 2020-10-31 ENCOUNTER — Telehealth: Payer: Self-pay | Admitting: Hematology and Oncology

## 2020-10-31 NOTE — Telephone Encounter (Signed)
Scheduled per 6/9 los. Called and spoke with pt, confirmed all added appts and confirmed the 6/20 appt at [facility]

## 2020-11-03 ENCOUNTER — Other Ambulatory Visit: Payer: Self-pay | Admitting: *Deleted

## 2020-11-06 ENCOUNTER — Inpatient Hospital Stay: Payer: Medicare Other

## 2020-11-06 ENCOUNTER — Other Ambulatory Visit: Payer: Self-pay | Admitting: Hematology and Oncology

## 2020-11-06 ENCOUNTER — Inpatient Hospital Stay: Payer: Medicare Other | Admitting: Hematology and Oncology

## 2020-11-06 ENCOUNTER — Other Ambulatory Visit: Payer: Self-pay

## 2020-11-06 DIAGNOSIS — Z79899 Other long term (current) drug therapy: Secondary | ICD-10-CM | POA: Diagnosis not present

## 2020-11-06 DIAGNOSIS — C549 Malignant neoplasm of corpus uteri, unspecified: Secondary | ICD-10-CM

## 2020-11-06 DIAGNOSIS — K449 Diaphragmatic hernia without obstruction or gangrene: Secondary | ICD-10-CM | POA: Diagnosis not present

## 2020-11-06 DIAGNOSIS — I10 Essential (primary) hypertension: Secondary | ICD-10-CM

## 2020-11-06 DIAGNOSIS — R5383 Other fatigue: Secondary | ICD-10-CM | POA: Diagnosis not present

## 2020-11-06 DIAGNOSIS — D539 Nutritional anemia, unspecified: Secondary | ICD-10-CM

## 2020-11-06 DIAGNOSIS — R590 Localized enlarged lymph nodes: Secondary | ICD-10-CM | POA: Diagnosis not present

## 2020-11-06 DIAGNOSIS — N179 Acute kidney failure, unspecified: Secondary | ICD-10-CM

## 2020-11-06 DIAGNOSIS — N183 Chronic kidney disease, stage 3 unspecified: Secondary | ICD-10-CM | POA: Diagnosis not present

## 2020-11-06 DIAGNOSIS — Z5111 Encounter for antineoplastic chemotherapy: Secondary | ICD-10-CM | POA: Diagnosis not present

## 2020-11-06 LAB — CBC WITH DIFFERENTIAL/PLATELET
Abs Immature Granulocytes: 0.01 10*3/uL (ref 0.00–0.07)
Basophils Absolute: 0 10*3/uL (ref 0.0–0.1)
Basophils Relative: 0 %
Eosinophils Absolute: 0.1 10*3/uL (ref 0.0–0.5)
Eosinophils Relative: 3 %
HCT: 23.9 % — ABNORMAL LOW (ref 36.0–46.0)
Hemoglobin: 7.4 g/dL — ABNORMAL LOW (ref 12.0–15.0)
Immature Granulocytes: 0 %
Lymphocytes Relative: 50 %
Lymphs Abs: 1.3 10*3/uL (ref 0.7–4.0)
MCH: 21.9 pg — ABNORMAL LOW (ref 26.0–34.0)
MCHC: 31 g/dL (ref 30.0–36.0)
MCV: 70.7 fL — ABNORMAL LOW (ref 80.0–100.0)
Monocytes Absolute: 0.3 10*3/uL (ref 0.1–1.0)
Monocytes Relative: 11 %
Neutro Abs: 0.9 10*3/uL — ABNORMAL LOW (ref 1.7–7.7)
Neutrophils Relative %: 36 %
Platelets: 273 10*3/uL (ref 150–400)
RBC: 3.38 MIL/uL — ABNORMAL LOW (ref 3.87–5.11)
RDW: 20.7 % — ABNORMAL HIGH (ref 11.5–15.5)
WBC: 2.6 10*3/uL — ABNORMAL LOW (ref 4.0–10.5)
nRBC: 0 % (ref 0.0–0.2)

## 2020-11-06 LAB — SAMPLE TO BLOOD BANK

## 2020-11-06 LAB — CMP (CANCER CENTER ONLY)
ALT: 13 U/L (ref 0–44)
AST: 20 U/L (ref 15–41)
Albumin: 3.9 g/dL (ref 3.5–5.0)
Alkaline Phosphatase: 69 U/L (ref 38–126)
Anion gap: 8 (ref 5–15)
BUN: 32 mg/dL — ABNORMAL HIGH (ref 8–23)
CO2: 26 mmol/L (ref 22–32)
Calcium: 9.5 mg/dL (ref 8.9–10.3)
Chloride: 105 mmol/L (ref 98–111)
Creatinine: 1.23 mg/dL — ABNORMAL HIGH (ref 0.44–1.00)
GFR, Estimated: 46 mL/min — ABNORMAL LOW (ref >60)
Glucose, Bld: 106 mg/dL — ABNORMAL HIGH (ref 70–99)
Potassium: 4.2 mmol/L (ref 3.5–5.1)
Sodium: 139 mmol/L (ref 135–145)
Total Bilirubin: 0.5 mg/dL (ref 0.3–1.2)
Total Protein: 7.4 g/dL (ref 6.5–8.1)

## 2020-11-06 LAB — PREPARE RBC (CROSSMATCH)

## 2020-11-06 MED ORDER — ACETAMINOPHEN 325 MG PO TABS
650.0000 mg | ORAL_TABLET | Freq: Once | ORAL | Status: AC
  Administered 2020-11-06: 650 mg via ORAL
  Filled 2020-11-06: qty 2

## 2020-11-06 MED ORDER — SODIUM CHLORIDE 0.9% FLUSH
10.0000 mL | Freq: Once | INTRAVENOUS | Status: AC
  Administered 2020-11-06: 10 mL
  Filled 2020-11-06: qty 10

## 2020-11-06 MED ORDER — DIPHENHYDRAMINE HCL 25 MG PO CAPS
25.0000 mg | ORAL_CAPSULE | Freq: Once | ORAL | Status: AC
  Administered 2020-11-06: 25 mg via ORAL
  Filled 2020-11-06: qty 1

## 2020-11-06 MED ORDER — SODIUM CHLORIDE 0.9% IV SOLUTION
250.0000 mL | Freq: Once | INTRAVENOUS | Status: AC
  Administered 2020-11-06: 250 mL via INTRAVENOUS
  Filled 2020-11-06: qty 250

## 2020-11-06 MED ORDER — HEPARIN SOD (PORK) LOCK FLUSH 100 UNIT/ML IV SOLN
500.0000 [IU] | Freq: Every day | INTRAVENOUS | Status: AC | PRN
  Administered 2020-11-06: 500 [IU]
  Filled 2020-11-06: qty 5

## 2020-11-06 NOTE — Patient Instructions (Signed)
Blood Transfusion, Adult, Care After This sheet gives you information about how to care for yourself after your procedure. Your doctor may also give you more specific instructions. If youhave problems or questions, contact your doctor. What can I expect after the procedure? After the procedure, it is common to have: Bruising and soreness at the IV site. A fever or chills on the day of the procedure. This may be your body's response to the new blood cells received. A headache. Follow these instructions at home: Insertion site care     Follow instructions from your doctor about how to take care of your insertion site. This is where an IV tube was put into your vein. Make sure you: Wash your hands with soap and water before and after you change your bandage (dressing). If you cannot use soap and water, use hand sanitizer. Change your bandage as told by your doctor. Check your insertion site every day for signs of infection. Check for: Redness, swelling, or pain. Bleeding from the site. Warmth. Pus or a bad smell. General instructions Take over-the-counter and prescription medicines only as told by your doctor. Rest as told by your doctor. Go back to your normal activities as told by your doctor. Keep all follow-up visits as told by your doctor. This is important. Contact a doctor if: You have itching or red, swollen areas of skin (hives). You feel worried or nervous (anxious). You feel weak after doing your normal activities. You have redness, swelling, warmth, or pain around the insertion site. You have blood coming from the insertion site, and the blood does not stop with pressure. You have pus or a bad smell coming from the insertion site. Get help right away if: You have signs of a serious reaction. This may be coming from an allergy or the body's defense system (immune system). Signs include: Trouble breathing or shortness of breath. Swelling of the face or feeling warm  (flushed). Fever or chills. Head, chest, or back pain. Dark pee (urine) or blood in the pee. Widespread rash. Fast heartbeat. Feeling dizzy or light-headed. You may receive your blood transfusion in an outpatient setting. If so, youwill be told whom to contact to report any reactions. These symptoms may be an emergency. Do not wait to see if the symptoms will go away. Get medical help right away. Call your local emergency services (911 in the [place] Do not drive yourself to the hospital. Summary Bruising and soreness at the IV site are common. Check your insertion site every day for signs of infection. Rest as told by your doctor. Go back to your normal activities as told by your doctor. Get help right away if you have signs of a serious reaction. This information is not intended to replace advice given to you by your health care provider. Make sure you discuss any questions you have with your healthcare provider. Document Revised: 10/29/2018 Document Reviewed: 10/29/2018 Elsevier Patient Education  2022 [employer] Inc.  

## 2020-11-06 NOTE — Patient Instructions (Signed)

## 2020-11-07 ENCOUNTER — Encounter: Payer: Self-pay | Admitting: Hematology and Oncology

## 2020-11-07 ENCOUNTER — Other Ambulatory Visit: Payer: Self-pay | Admitting: Hematology and Oncology

## 2020-11-07 LAB — TYPE AND SCREEN
ABO/RH(D): A POS
Antibody Screen: NEGATIVE
Unit division: 0

## 2020-11-07 LAB — BPAM RBC
Blood Product Expiration Date: 202207072359
ISSUE DATE / TIME: 202206201034
Unit Type and Rh: 6200

## 2020-11-07 NOTE — Assessment & Plan Note (Signed)
She has multifactorial anemia, combination of thalassemia and recent anemia chronic illness We discussed some of the risks, benefits, and alternatives of blood transfusions. The patient is symptomatic from anemia and the hemoglobin level is critically low.  Some of the side-effects to be expected including risks of transfusion reactions, chills, infection, syndrome of volume overload and risk of hospitalization from various reasons and the patient is willing to proceed and went ahead to sign consent today. She will receive 1 unit of blood today

## 2020-11-07 NOTE — Progress Notes (Signed)
[place] OFFICE PROGRESS NOTE  Patient Care Team: Jani Gravel, MD as PCP - General (Internal Medicine) [name], RN as Oncology Nurse Navigator (Oncology)  ASSESSMENT & PLAN:  Uterine cancer [facility]) Clinically, she appears to be doing very well I suspect she has good response to treatment She is scheduled for CT imaging We will proceed with blood transfusion support  Deficiency anemia She has multifactorial anemia, combination of thalassemia and recent anemia chronic illness We discussed some of the risks, benefits, and alternatives of blood transfusions. The patient is symptomatic from anemia and the hemoglobin level is critically low.  Some of the side-effects to be expected including risks of transfusion reactions, chills, infection, syndrome of volume overload and risk of hospitalization from various reasons and the patient is willing to proceed and went ahead to sign consent today. She will receive 1 unit of blood today  Essential hypertension Her elevated blood pressure is attributed to anxiety Her documented blood pressure from home were within normal limits I do not plan to adjust her blood pressure medications today  Orders Placed This Encounter  Procedures   CMP ([place] only)   Prepare RBC (crossmatch)    Standing Status:   Standing    Number of Occurrences:   1    Order Specific Question:   # of Units    Answer:   1 unit    Order Specific Question:   Transfusion Indications    Answer:   Symptomatic Anemia    Order Specific Question:   Number of Units to Keep Ahead    Answer:   NO units ahead    Order Specific Question:   Instructions:    Answer:   Transfuse    Order Specific Question:   If emergent release call blood bank    Answer:   Not emergent release    All questions were answered. The patient knows to call the clinic with any problems, questions or concerns. The total time spent in the appointment was 20 minutes encounter with  patients including review of chart and various tests results, discussions about plan of care and coordination of care plan   [name], MD 11/07/2020 10:56 AM  INTERVAL HISTORY: Please see below for problem oriented charting. She returns for further follow-up She complained of mild fatigue but otherwise feels okay Denies recent pain No significant nausea or constipation The patient denies any recent signs or symptoms of bleeding such as spontaneous epistaxis, hematuria or hematochezia.  SUMMARY OF ONCOLOGIC HISTORY: Oncology History Overview Note  High grade serous MMR normal Her2 negative   Uterine cancer ([place])  08/28/2020 Imaging   1. Pelvic mass measuring 7.5 x 4.8 cm, contiguous with the sigmoid colon and uterus with loss of fat planes. Site of origin is unclear, may be ovarian, uterine, or colonic. Lack of contrast limits detailed assessment. 2. Above pelvic mass causes partial obstruction of the left ureter with mild left hydroureteronephrosis. 3. Multifocal adenopathy in the abdomen and pelvis, suspicious for metastatic disease, primarily in the mesentery and lower peritoneum and pelvis. Please note that detailed assessment of adenopathy is limited on this noncontrast exam. Recommend oncology referral.  4. Wall thickening of the sigmoid colon distal to the left pelvic mass with pericolonic edema, suspicious for colitis. There is a moderate to large amount of stool in the more proximal colon, suggesting pelvic mass may be causing delayed colonic transit. 5. Small to moderate hiatal hernia.   08/28/2020 - 09/04/2020 Hospital  Admission   The patient has been complaining of reduced urine output, loss of appetite and changes in bowel habits She had abnormal imaging study leading to hospitalization, colonoscopy, biopsy and subsequently chemotherapy   08/30/2020 Procedure   CT-guided biopsy of enlarged left external iliac chain lymph node as above   08/30/2020 Pathology Results    SURGICAL PATHOLOGY  CASE: [id number]  PATIENT: Teresa Oliver  Surgical Pathology Report  Clinical History: Pelvic mass with metastatic lymph adenopathy (crm)  FINAL MICROSCOPIC DIAGNOSIS:   A. LYMPH NODE, LEFT EXTERNAL ILIAC CHAIN, BIOPSY:  - Poorly differentiated carcinoma with necrosis.  - See comment.   COMMENT:  The carcinoma is characterized by diffuse sheets of malignant epithelioid cells with prominent nucleoli and frequent mitotic figures. Immunohistochemistry is positive with cytokeratin 7 and PAX 8 with weak  positivity with estrogen receptor and WT1.  P53 shows diffuse strong nuclear positivity.  The carcinoma is negative with cytokeratin 20, CDX2, GATA3, GCDFP, TTF-1, HepPar 1, arginase 1 and CD10.  The  morphology and immunophenotype are most consistent with a gynecologic primary including high-grade serous carcinoma.    08/31/2020 Procedure   Colonoscopy - Preparation of the colon was fair. - Likely malignant partially obstructing tumor in the sigmoid colon (appears extrinsic process). Biopsied. Tattooed. - Internal hemorrhoids. - Stool in the entire examined colon   09/01/2020 Initial Diagnosis   Uterine cancer ([place])    09/01/2020 Procedure   Successful placement of a power injectable Port-A-Cath via the right internal jugular vein. The catheter is ready for immediate use.   09/04/2020 -  Chemotherapy    Patient is on Treatment Plan: UTERINE CARBOPLATIN AUC 6 / PACLITAXEL Q21D       09/07/2020 Cancer Staging   Staging form: Corpus Uteri - Carcinoma and Carcinosarcoma, AJCC 8th Edition - Clinical: FIGO Stage IVA (cT4, cN2a, cM0) - Signed by [name], MD on 09/07/2020      REVIEW OF SYSTEMS:   Constitutional: Denies fevers, chills or abnormal weight loss Eyes: Denies blurriness of vision Ears, nose, mouth, throat, and face: Denies mucositis or sore throat Respiratory: Denies cough, dyspnea or wheezes Cardiovascular: Denies palpitation, chest discomfort or  lower extremity swelling Gastrointestinal:  Denies nausea, heartburn or change in bowel habits Skin: Denies abnormal skin rashes Lymphatics: Denies new lymphadenopathy or easy bruising Neurological:Denies numbness, tingling or new weaknesses Behavioral/Psych: Mood is stable, no new changes  All other systems were reviewed with the patient and are negative.  I have reviewed the past medical history, past surgical history, social history and family history with the patient and they are unchanged from previous note.  ALLERGIES:  has No Known Allergies.  MEDICATIONS:  Current Outpatient Medications  Medication Sig Dispense Refill   amLODipine (NORVASC) 10 MG tablet Take 1 tablet (10 mg total) by mouth daily. 30 tablet 2   atenolol (TENORMIN) 50 MG tablet Take 1 tablet (50 mg total) by mouth daily. 30 tablet 2   cholecalciferol (VITAMIN D3) 25 MCG (1000 UNIT) tablet Take 1,000 Units by mouth daily.     dexamethasone (DECADRON) 4 MG tablet Take 2 tabs at the night before and 2 tab the morning of chemotherapy, every 3 weeks, by mouth x 6 cycles 36 tablet 6   lidocaine-prilocaine (EMLA) cream Apply 1 application topically daily as needed. 30 g 3   LORazepam (ATIVAN) 0.5 MG tablet TAKE 1 TABLET(0.5 MG) BY MOUTH TWICE DAILY AS NEEDED FOR ANXIETY 60 tablet 0   magnesium oxide (MAG-OX) 400 (240 Mg) MG  tablet Take 1 tablet (400 mg total) by mouth 2 (two) times daily. 60 tablet 1   ondansetron (ZOFRAN) 8 MG tablet Take 1 tablet (8 mg total) by mouth every 8 (eight) hours as needed for nausea. 30 tablet 3   oxybutynin (DITROPAN) 5 MG tablet Take 1 tablet (5 mg total) by mouth every 8 (eight) hours as needed for bladder spasms. 90 tablet 1   polyethylene glycol (MIRALAX / GLYCOLAX) 17 g packet Take 17 g by mouth 2 (two) times daily as needed for moderate constipation. 14 each 0   prochlorperazine (COMPAZINE) 10 MG tablet Take 1 tablet (10 mg total) by mouth every 6 (six) hours as needed for nausea or  vomiting. 30 tablet 1   senna-docusate (SENOKOT-S) 8.6-50 MG tablet Take 1 tablet by mouth 2 (two) times daily. 30 tablet 1   No current facility-administered medications for this visit.    PHYSICAL EXAMINATION: ECOG PERFORMANCE STATUS: 0 - Asymptomatic  Vitals:   11/06/20 0856  BP: (!) 176/81  Pulse: (!) 56  Resp: 16  Temp: (!) 96.3 F (35.7 C)  SpO2: 100%   Filed Weights   11/06/20 0856  Weight: 110 lb 8 oz (50.1 kg)    GENERAL:alert, no distress and comfortable SKIN: skin color, texture, turgor are normal, no rashes or significant lesions EYES: normal, Conjunctiva are pink and non-injected, sclera clear OROPHARYNX:no exudate, no erythema and lips, buccal mucosa, and tongue normal  NECK: supple, thyroid normal size, non-tender, without nodularity LYMPH:  no palpable lymphadenopathy in the cervical, axillary or inguinal LUNGS: clear to auscultation and percussion with normal breathing effort HEART: regular rate & rhythm and no murmurs and no lower extremity edema ABDOMEN:abdomen soft, non-tender and normal bowel sounds Musculoskeletal:no cyanosis of digits and no clubbing  NEURO: alert & oriented x 3 with fluent speech, no focal motor/sensory deficits  LABORATORY DATA:  I have reviewed the data as listed    Component Value Date/Time   NA 139 11/06/2020 0815   K 4.2 11/06/2020 0815   CL 105 11/06/2020 0815   CO2 26 11/06/2020 0815   GLUCOSE 106 (H) 11/06/2020 0815   BUN 32 (H) 11/06/2020 0815   CREATININE 1.23 (H) 11/06/2020 0815   CALCIUM 9.5 11/06/2020 0815   PROT 7.4 11/06/2020 0815   ALBUMIN 3.9 11/06/2020 0815   AST 20 11/06/2020 0815   ALT 13 11/06/2020 0815   ALKPHOS 69 11/06/2020 0815   BILITOT 0.5 11/06/2020 0815   GFRNONAA 46 (L) 11/06/2020 0815    No results found for: SPEP, UPEP  Lab Results  Component Value Date   WBC 2.6 (L) 11/06/2020   NEUTROABS 0.9 (L) 11/06/2020   HGB 7.4 (L) 11/06/2020   HCT 23.9 (L) 11/06/2020   MCV 70.7 (L)  11/06/2020   PLT 273 11/06/2020      Chemistry      Component Value Date/Time   NA 139 11/06/2020 0815   K 4.2 11/06/2020 0815   CL 105 11/06/2020 0815   CO2 26 11/06/2020 0815   BUN 32 (H) 11/06/2020 0815   CREATININE 1.23 (H) 11/06/2020 0815      Component Value Date/Time   CALCIUM 9.5 11/06/2020 0815   ALKPHOS 69 11/06/2020 0815   AST 20 11/06/2020 0815   ALT 13 11/06/2020 0815   BILITOT 0.5 11/06/2020 0815

## 2020-11-07 NOTE — Assessment & Plan Note (Signed)
Clinically, she appears to be doing very well I suspect she has good response to treatment She is scheduled for CT imaging We will proceed with blood transfusion support

## 2020-11-07 NOTE — Assessment & Plan Note (Signed)
Her elevated blood pressure is attributed to anxiety Her documented blood pressure from home were within normal limits I do not plan to adjust her blood pressure medications today

## 2020-11-15 ENCOUNTER — Other Ambulatory Visit: Payer: Self-pay

## 2020-11-15 ENCOUNTER — Ambulatory Visit (HOSPITAL_COMMUNITY)
Admission: RE | Admit: 2020-11-15 | Discharge: 2020-11-15 | Disposition: A | Discharge status: Home / Self Care | Payer: Medicare Other | Source: Ambulatory Visit | Attending: Hematology and Oncology | Admitting: Hematology and Oncology

## 2020-11-15 DIAGNOSIS — R590 Localized enlarged lymph nodes: Secondary | ICD-10-CM | POA: Diagnosis not present

## 2020-11-15 DIAGNOSIS — J9811 Atelectasis: Secondary | ICD-10-CM | POA: Diagnosis not present

## 2020-11-15 DIAGNOSIS — N3289 Other specified disorders of bladder: Secondary | ICD-10-CM | POA: Diagnosis not present

## 2020-11-15 DIAGNOSIS — C549 Malignant neoplasm of corpus uteri, unspecified: Secondary | ICD-10-CM | POA: Diagnosis not present

## 2020-11-15 IMAGING — CT CT CHEST-ABD-PELV W/ CM
2 of 5 series · 13 of 36 positions shown, 15 images · IV contrast (OMNIPAQUE)
Comparison: [DATE].

CLINICAL DATA: Uterine cancer follow-up in a 75-year-old female, no
reported symptoms.

EXAM:
CT CHEST, ABDOMEN, AND PELVIS WITH CONTRAST
TECHNIQUE: Multidetector CT imaging of the chest, abdomen and pelvis was
performed following the standard protocol during bolus
administration of intravenous contrast.
CONTRAST:  75mL OMNIPAQUE IOHEXOL 300 MG/ML  SOLN

[Series 2: cap with · axial · 0.69mm/px · z∈[+1094,+1548]mm · 10 of 113 slices shown, 12 images]
[im 11/113  mediastinal]
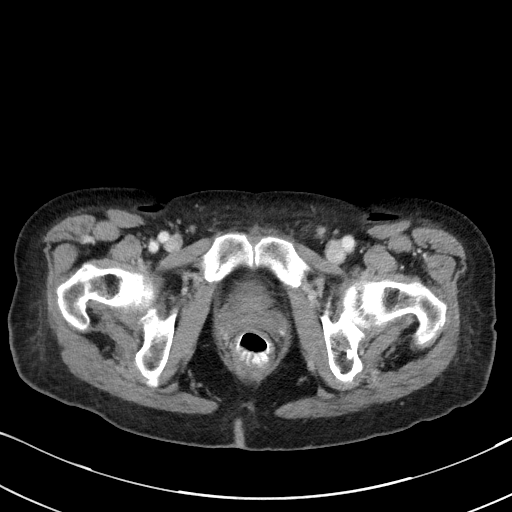
[im 11/113  bone]
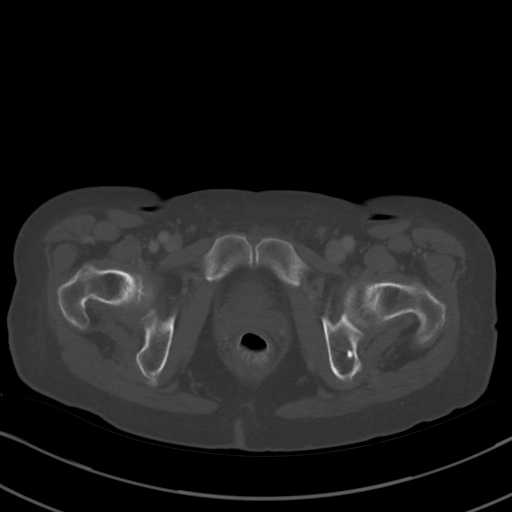
[im 21/113  mediastinal]
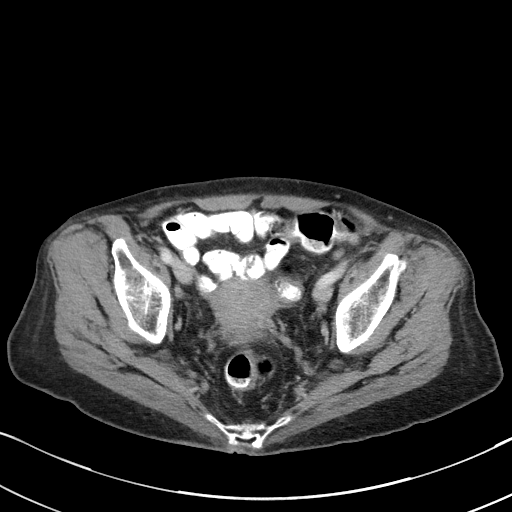
[im 31/113  mediastinal]
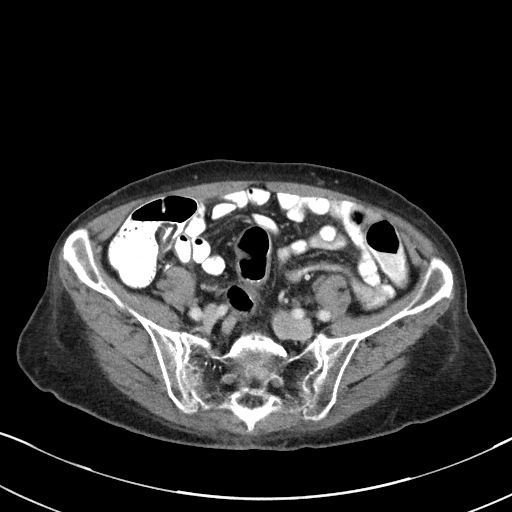
[im 41/113  mediastinal]
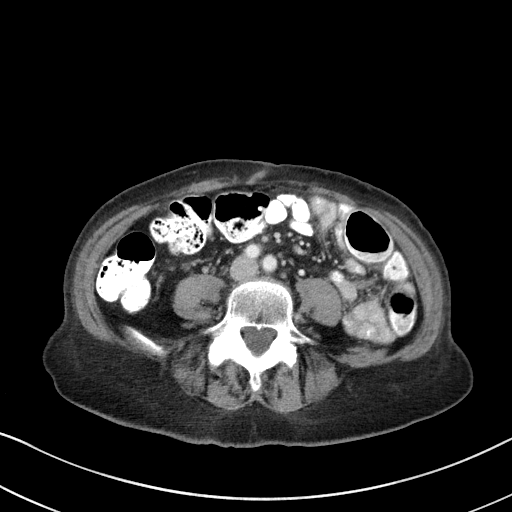
[im 51/113  mediastinal]
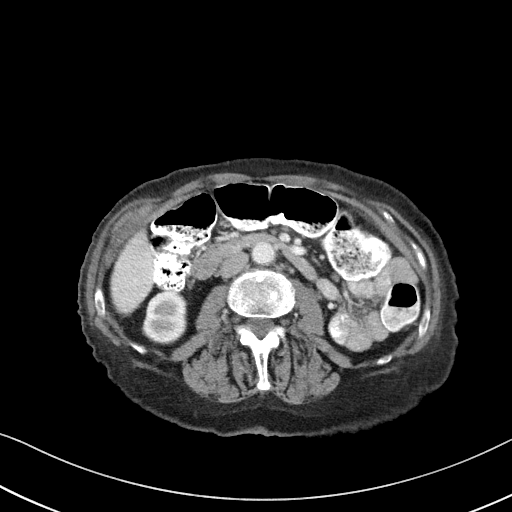
[im 62/113  mediastinal]
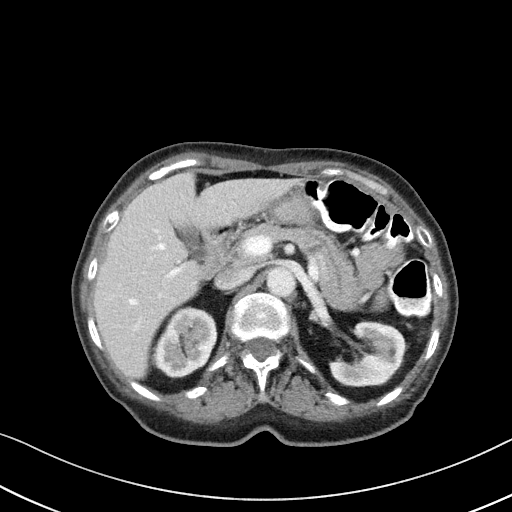
[im 72/113  mediastinal]
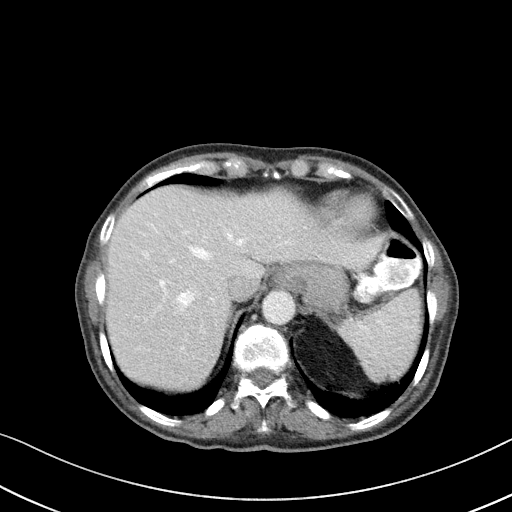
[im 82/113  mediastinal]
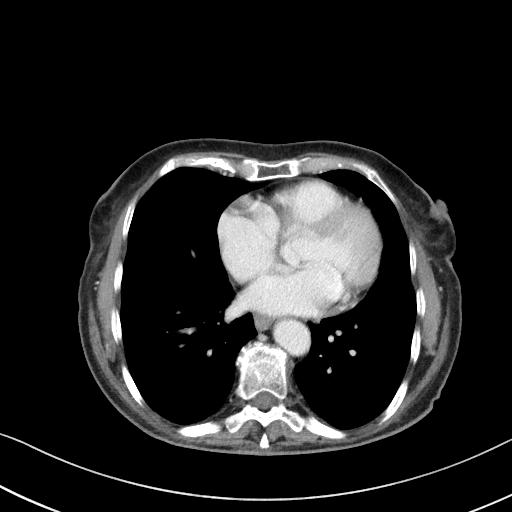
[im 92/113  mediastinal]
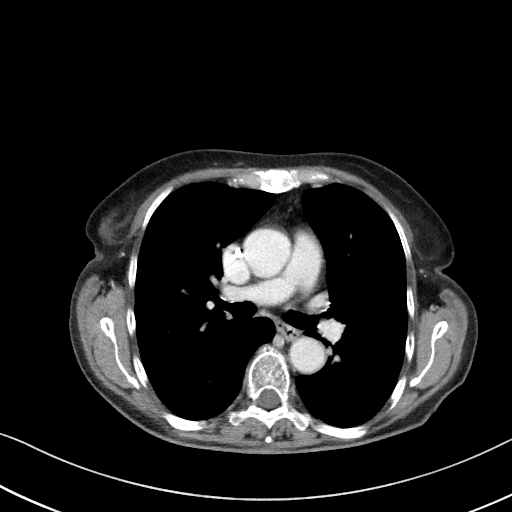
[im 92/113  bone]
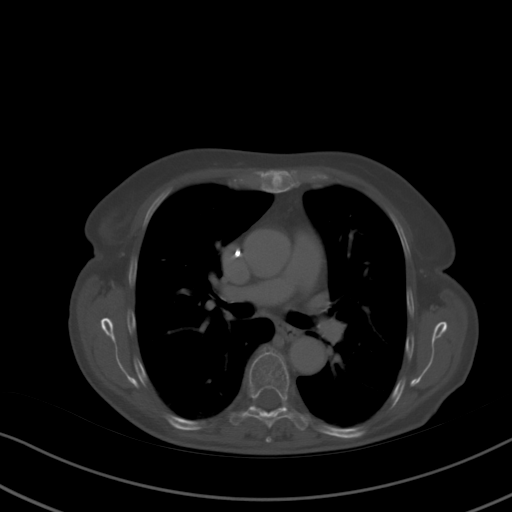
[im 102/113  mediastinal]
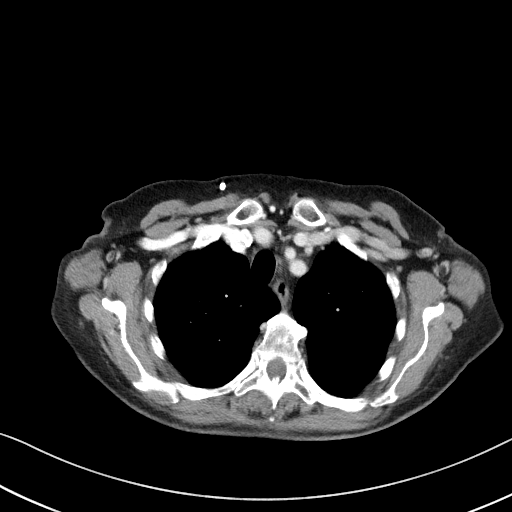

[Series 4: coronals · coronal · 0.82mm/px · 3 of 99 slices shown]
[im 20/99  mediastinal]
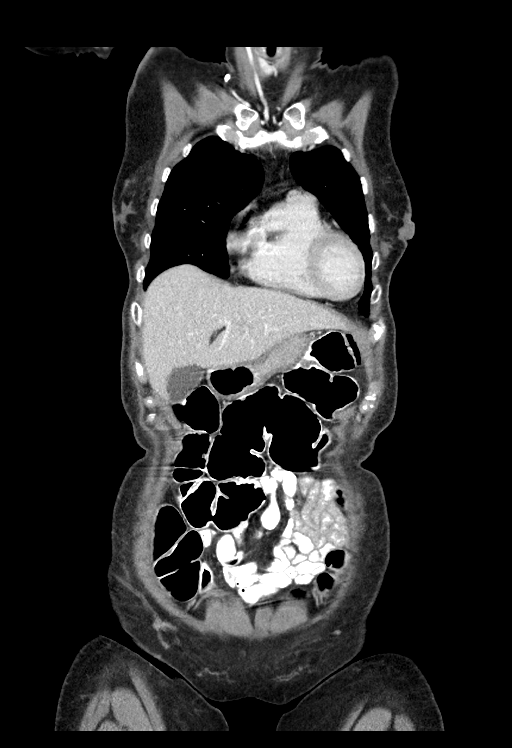
[im 40/99  mediastinal]
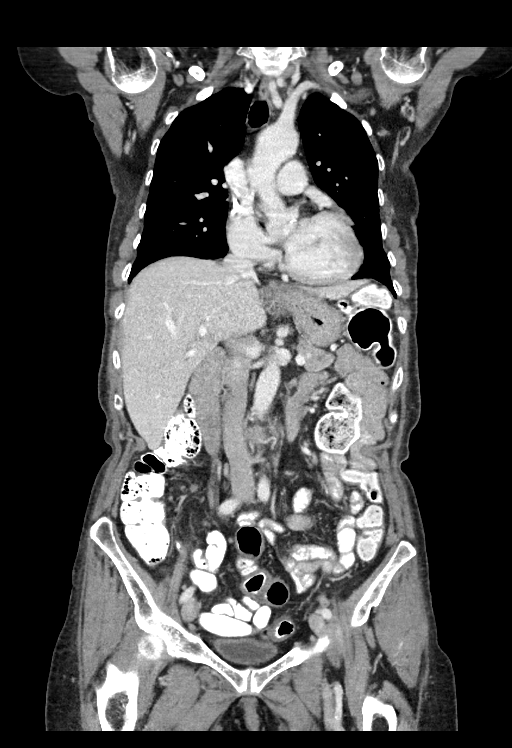
[im 59/99  mediastinal]
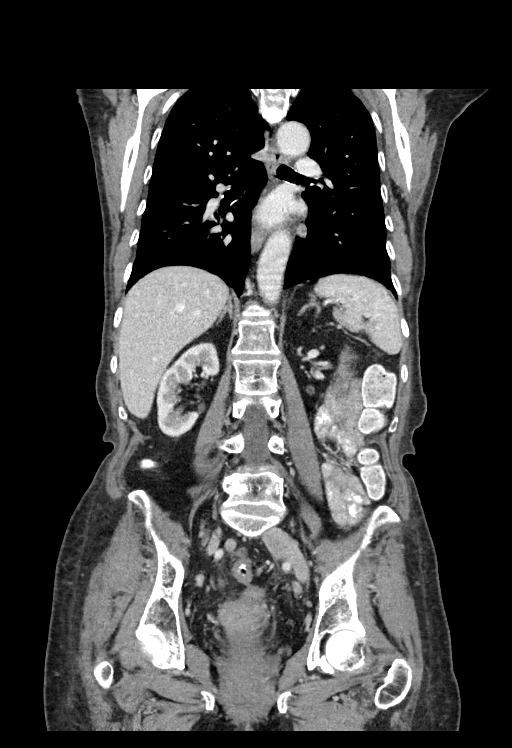

[13 of 36 positions shown; findings below may reference images not displayed]

FINDINGS: CT CHEST FINDINGS

Cardiovascular: RIGHT-sided Port-A-Cath terminates at the caval to
atrial junction. Aorta is normal caliber. Heart size normal without
pericardial effusion. Central pulmonary vasculature is unremarkable
on venous phase.

Mediastinum/Nodes: LEFT thoracic inlet lymph node measures 11 mm
with ovoid morphology (image [DATE])

Smaller rounded lymph nodes are present in the LEFT supraclavicular
region as well. No axillary lymphadenopathy.

No signs of adenopathy in the mediastinum or hila. Esophagus grossly
normal. RIGHT-sided Port-A-Cath terminates at the caval to atrial
junction.

Lungs/Pleura: No sign of consolidation or evidence of pleural
effusion. Basilar atelectasis. Airways are patent. Lungs are clear.

Musculoskeletal: See below for full musculoskeletal details.

CT ABDOMEN PELVIS FINDINGS

Hepatobiliary: No focal, suspicious hepatic lesion. No
pericholecystic stranding. No sign of biliary duct distension.

Pancreas: Normal, without mass, inflammation or ductal dilatation.

Spleen: Spleen is normal in size and contour without focal lesion.

Adrenals/Urinary Tract: Adrenal glands are normal.

Symmetric renal enhancement. No hydronephrosis. No nephrolithiasis.
Urinary bladder is under distended limiting assessment but without
gross abnormality.

Stomach/Bowel: Stomach is unremarkable. No sign of small bowel
abnormality. The appendix is normal.

Proximal colon is normal.

Thickening of the distal colon. Marked interval decrease in size of
the mass extending from the uterus to the colon that was seen on the
previous examination. See below for complete detail. No pericolonic
stranding but with soft tissue bridging the colon and vaginal apex.

Vascular/Lymphatic: Patent abdominal vessels.

Marked interval decrease in size and or resolution of multiple
retroperitoneal lymph nodes and mesenteric lymph nodes.

LEFT pelvic sidewall, external iliac lymph node (image 89/2) as
large is 2.3 cm short axis now 0.8 cm.

No lymph nodes greater than a cm in the pelvis or in the
retroperitoneum. Index LEFT periaortic lymph nodes seen on image 30
of series 2 on the prior exam in the 2-3 mm range in terms of short
axis on today's study. No new signs of nodal disease

Reproductive: Arising from the posterior uterus is a soft tissue
mass that has diminished in size considerably since previous imaging
now measuring approximately 3.1 x 3.1 cm greatest axial dimension
previously as large as 7.5 x 4.8 cm (image 91/2) hypodense centrally
and contacting the mid sigmoid colon on the current study with some
increased attenuation along the margin of central hypodensity,
bridging the colon and the upper vagina

Inseparable from the mid sigmoid and the rectosigmoid junction. This
measures approximately 3.0 cm in craniocaudal dimension as compared
to 5.5 cm on the previous study.

Other: No ascites.  No peritoneal nodularity.  No free air.

Musculoskeletal: No acute musculoskeletal process. No destructive
bone finding. Spinal degenerative changes. Loss of height at T5 with
chronic appearance.
IMPRESSION: 1. Marked interval response to therapy with decrease in size of the
mass extending from the uterus to the colon that was seen on the
previous examination. Also with decreased size and or resolution of
pelvic, mesenteric and retroperitoneal adenopathy seen on previous
imaging.
2. Tethering of the colon with area that raise the question of
developing colovaginal fistula versus internal enhancement within
the mass that is now decreased in size. Attention on follow-up is
suggested. Correlate with any current signs of vaginal drainage.
3. LEFT thoracic inlet adenopathy compatible with involvement in the
chest. Area not imaged on previous imaging.

## 2020-11-15 MED ORDER — SODIUM CHLORIDE (PF) 0.9 % IJ SOLN
INTRAMUSCULAR | Status: AC
  Filled 2020-11-15: qty 50

## 2020-11-15 MED ORDER — IOHEXOL 300 MG/ML  SOLN
100.0000 mL | Freq: Once | INTRAMUSCULAR | Status: AC | PRN
  Administered 2020-11-15: 75 mL via INTRAVENOUS

## 2020-11-17 ENCOUNTER — Encounter: Payer: Self-pay | Admitting: Hematology and Oncology

## 2020-11-17 ENCOUNTER — Inpatient Hospital Stay: Payer: Medicare Other | Attending: Hematology and Oncology

## 2020-11-17 ENCOUNTER — Other Ambulatory Visit: Payer: Self-pay

## 2020-11-17 ENCOUNTER — Inpatient Hospital Stay: Payer: Medicare Other | Admitting: Hematology and Oncology

## 2020-11-17 ENCOUNTER — Inpatient Hospital Stay: Payer: Medicare Other

## 2020-11-17 ENCOUNTER — Telehealth: Payer: Self-pay | Admitting: Hematology and Oncology

## 2020-11-17 DIAGNOSIS — I10 Essential (primary) hypertension: Secondary | ICD-10-CM

## 2020-11-17 DIAGNOSIS — N179 Acute kidney failure, unspecified: Secondary | ICD-10-CM

## 2020-11-17 DIAGNOSIS — C549 Malignant neoplasm of corpus uteri, unspecified: Secondary | ICD-10-CM

## 2020-11-17 DIAGNOSIS — D539 Nutritional anemia, unspecified: Secondary | ICD-10-CM | POA: Diagnosis not present

## 2020-11-17 DIAGNOSIS — C55 Malignant neoplasm of uterus, part unspecified: Secondary | ICD-10-CM | POA: Diagnosis present

## 2020-11-17 DIAGNOSIS — Z5111 Encounter for antineoplastic chemotherapy: Secondary | ICD-10-CM | POA: Insufficient documentation

## 2020-11-17 DIAGNOSIS — N183 Chronic kidney disease, stage 3 unspecified: Secondary | ICD-10-CM

## 2020-11-17 LAB — CBC WITH DIFFERENTIAL/PLATELET
Abs Immature Granulocytes: 0.11 10*3/uL — ABNORMAL HIGH (ref 0.00–0.07)
Basophils Absolute: 0 10*3/uL (ref 0.0–0.1)
Basophils Relative: 0 %
Eosinophils Absolute: 0 10*3/uL (ref 0.0–0.5)
Eosinophils Relative: 0 %
HCT: 32.2 % — ABNORMAL LOW (ref 36.0–46.0)
Hemoglobin: 10.2 g/dL — ABNORMAL LOW (ref 12.0–15.0)
Immature Granulocytes: 2 %
Lymphocytes Relative: 17 %
Lymphs Abs: 1.1 10*3/uL (ref 0.7–4.0)
MCH: 23.3 pg — ABNORMAL LOW (ref 26.0–34.0)
MCHC: 31.7 g/dL (ref 30.0–36.0)
MCV: 73.7 fL — ABNORMAL LOW (ref 80.0–100.0)
Monocytes Absolute: 0.2 10*3/uL (ref 0.1–1.0)
Monocytes Relative: 2 %
Neutro Abs: 5.5 10*3/uL (ref 1.7–7.7)
Neutrophils Relative %: 79 %
Platelets: 261 10*3/uL (ref 150–400)
RBC: 4.37 MIL/uL (ref 3.87–5.11)
RDW: 21.2 % — ABNORMAL HIGH (ref 11.5–15.5)
WBC: 6.9 10*3/uL (ref 4.0–10.5)
nRBC: 0 % (ref 0.0–0.2)

## 2020-11-17 LAB — CMP (CANCER CENTER ONLY)
ALT: 13 U/L (ref 0–44)
AST: 23 U/L (ref 15–41)
Albumin: 4.2 g/dL (ref 3.5–5.0)
Alkaline Phosphatase: 75 U/L (ref 38–126)
Anion gap: 8 (ref 5–15)
BUN: 37 mg/dL — ABNORMAL HIGH (ref 8–23)
CO2: 25 mmol/L (ref 22–32)
Calcium: 10 mg/dL (ref 8.9–10.3)
Chloride: 103 mmol/L (ref 98–111)
Creatinine: 1.29 mg/dL — ABNORMAL HIGH (ref 0.44–1.00)
GFR, Estimated: 44 mL/min — ABNORMAL LOW (ref >60)
Glucose, Bld: 141 mg/dL — ABNORMAL HIGH (ref 70–99)
Potassium: 4.3 mmol/L (ref 3.5–5.1)
Sodium: 136 mmol/L (ref 135–145)
Total Bilirubin: 0.4 mg/dL (ref 0.3–1.2)
Total Protein: 8 g/dL (ref 6.5–8.1)

## 2020-11-17 LAB — SAMPLE TO BLOOD BANK

## 2020-11-17 MED ORDER — DIPHENHYDRAMINE HCL 50 MG/ML IJ SOLN
12.5000 mg | Freq: Once | INTRAMUSCULAR | Status: AC
  Administered 2020-11-17: 12.5 mg via INTRAVENOUS

## 2020-11-17 MED ORDER — SODIUM CHLORIDE 0.9 % IV SOLN
Freq: Once | INTRAVENOUS | Status: AC
  Filled 2020-11-17: qty 250

## 2020-11-17 MED ORDER — FAMOTIDINE 20 MG IN NS 100 ML IVPB
20.0000 mg | Freq: Once | INTRAVENOUS | Status: AC
  Administered 2020-11-17: 20 mg via INTRAVENOUS
  Filled 2020-11-17: qty 100

## 2020-11-17 MED ORDER — SODIUM CHLORIDE 0.9 % IV SOLN
248.8500 mg | Freq: Once | INTRAVENOUS | Status: AC
  Administered 2020-11-17: 250 mg via INTRAVENOUS
  Filled 2020-11-17: qty 25

## 2020-11-17 MED ORDER — SODIUM CHLORIDE 0.9 % IV SOLN
150.0000 mg | Freq: Once | INTRAVENOUS | Status: AC
  Administered 2020-11-17: 150 mg via INTRAVENOUS
  Filled 2020-11-17: qty 150

## 2020-11-17 MED ORDER — SODIUM CHLORIDE 0.9 % IV SOLN
10.0000 mg | Freq: Once | INTRAVENOUS | Status: AC
  Administered 2020-11-17: 10 mg via INTRAVENOUS
  Filled 2020-11-17: qty 10

## 2020-11-17 MED ORDER — SODIUM CHLORIDE 0.9% FLUSH
10.0000 mL | INTRAVENOUS | Status: DC | PRN
  Administered 2020-11-17: 10 mL
  Filled 2020-11-17: qty 10

## 2020-11-17 MED ORDER — PALONOSETRON HCL INJECTION 0.25 MG/5ML
0.2500 mg | Freq: Once | INTRAVENOUS | Status: AC
  Administered 2020-11-17: 0.25 mg via INTRAVENOUS

## 2020-11-17 MED ORDER — SODIUM CHLORIDE 0.9 % IV SOLN
175.0000 mg/m2 | Freq: Once | INTRAVENOUS | Status: AC
  Administered 2020-11-17: 258 mg via INTRAVENOUS
  Filled 2020-11-17: qty 43

## 2020-11-17 MED ORDER — HEPARIN SOD (PORK) LOCK FLUSH 100 UNIT/ML IV SOLN
500.0000 [IU] | Freq: Once | INTRAVENOUS | Status: AC | PRN
  Administered 2020-11-17: 500 [IU]
  Filled 2020-11-17: qty 5

## 2020-11-17 MED ORDER — DIPHENHYDRAMINE HCL 50 MG/ML IJ SOLN
INTRAMUSCULAR | Status: AC
  Filled 2020-11-17: qty 1

## 2020-11-17 MED ORDER — PALONOSETRON HCL INJECTION 0.25 MG/5ML
INTRAVENOUS | Status: AC
  Filled 2020-11-17: qty 5

## 2020-11-17 NOTE — Patient Instructions (Signed)
Implanted Port Home Guide An implanted port is a device that is placed under the skin. It is usually placed in the chest. The device can be used to give IV medicine, to take blood, or for dialysis. You may have an implanted port if: You need IV medicine that would be irritating to the small veins in your hands or arms. You need IV medicines, such as antibiotics, for a long period of time. You need IV nutrition for a long period of time. You need dialysis. When you have a port, your health care provider can choose to use the port instead of veins in your arms for these procedures. You may have fewer limitations when using a port than you would if you used other types of long-term IVs, and you will likely be able to return to normal activities afteryour incision heals. An implanted port has two main parts: Reservoir. The reservoir is the part where a needle is inserted to give medicines or draw blood. The reservoir is round. After it is placed, it appears as a small, raised area under your skin. Catheter. The catheter is a thin, flexible tube that connects the reservoir to a vein. Medicine that is inserted into the reservoir goes into the catheter and then into the vein. How is my port accessed? To access your port: A numbing cream may be placed on the skin over the port site. Your health care provider will put on a mask and sterile gloves. The skin over your port will be cleaned carefully with a germ-killing soap and allowed to dry. Your health care provider will gently pinch the port and insert a needle into it. Your health care provider will check for a blood return to make sure the port is in the vein and is not clogged. If your port needs to remain accessed to get medicine continuously (constant infusion), your health care provider will place a clear bandage (dressing) over the needle site. The dressing and needle will need to be changed every week, or as told by your health care provider. What  is flushing? Flushing helps keep the port from getting clogged. Follow instructions from your health care provider about how and when to flush the port. Ports are usually flushed with saline solution or a medicine called heparin. The need for flushing will depend on how the port is used: If the port is only used from time to time to give medicines or draw blood, the port may need to be flushed: Before and after medicines have been given. Before and after blood has been drawn. As part of routine maintenance. Flushing may be recommended every 4-6 weeks. If a constant infusion is running, the port may not need to be flushed. Throw away any syringes in a disposal container that is meant for sharp items (sharps container). You can buy a sharps container from a pharmacy, or you can make one by using an empty hard plastic bottle with a cover. How long will my port stay implanted? The port can stay in for as long as your health care provider thinks it is needed. When it is time for the port to come out, a surgery will be done to remove it. The surgery will be similar to the procedure that was done to putthe port in. Follow these instructions at home:  Flush your port as told by your health care provider. If you need an infusion over several days, follow instructions from your health care provider about how to take   care of your port site. Make sure you: Wash your hands with soap and water before you change your dressing. If soap and water are not available, use alcohol-based hand sanitizer. Change your dressing as told by your health care provider. Place any used dressings or infusion bags into a plastic bag. Throw that bag in the trash. Keep the dressing that covers the needle clean and dry. Do not get it wet. Do not use scissors or sharp objects near the tube. Keep the tube clamped, unless it is being used. Check your port site every day for signs of infection. Check for: Redness, swelling, or  pain. Fluid or blood. Pus or a bad smell. Protect the skin around the port site. Avoid wearing bra straps that rub or irritate the site. Protect the skin around your port from seat belts. Place a soft pad over your chest if needed. Bathe or shower as told by your health care provider. The site may get wet as long as you are not actively receiving an infusion. Return to your normal activities as told by your health care provider. Ask your health care provider what activities are safe for you. Carry a medical alert card or wear a medical alert bracelet at all times. This will let health care providers know that you have an implanted port in case of an emergency. Get help right away if: You have redness, swelling, or pain at the port site. You have fluid or blood coming from your port site. You have pus or a bad smell coming from the port site. You have a fever. Summary Implanted ports are usually placed in the chest for long-term IV access. Follow instructions from your health care provider about flushing the port and changing bandages (dressings). Take care of the area around your port by avoiding clothing that puts pressure on the area, and by watching for signs of infection. Protect the skin around your port from seat belts. Place a soft pad over your chest if needed. Get help right away if you have a fever or you have redness, swelling, pain, drainage, or a bad smell at the port site. This information is not intended to replace advice given to you by your health care provider. Make sure you discuss any questions you have with your healthcare provider. Document Revised: 09/20/2019 Document Reviewed: 09/20/2019 [employer]vier Patient Education  2022 [employer]  

## 2020-11-17 NOTE — Patient Instructions (Signed)
[facility] MEDICAL ONCOLOGY  Discharge Instructions: Thank you for choosing [facility] to provide your oncology and hematology care.   If you have a lab appointment with the Cancer Center, please go directly to the Cancer Center and check in at the registration area.   Wear comfortable clothing and clothing appropriate for easy access to any Portacath or PICC line.   We strive to give you quality time with your provider. You may need to reschedule your appointment if you arrive late (15 or more minutes).  Arriving late affects you and other patients whose appointments are after yours.  Also, if you miss three or more appointments without notifying the office, you may be dismissed from the clinic at the provider's discretion.      For prescription refill requests, have your pharmacy contact our office and allow 72 hours for refills to be completed.    Today you received the following chemotherapy and/or immunotherapy agents : Paclitaxel,  Carboplatin.   To help prevent nausea and vomiting after your treatment, we encourage you to take your nausea medication as directed.  BELOW ARE SYMPTOMS THAT SHOULD BE REPORTED IMMEDIATELY: *FEVER GREATER THAN 100.4 F (38 C) OR HIGHER *CHILLS OR SWEATING *NAUSEA AND VOMITING THAT IS NOT CONTROLLED WITH YOUR NAUSEA MEDICATION *UNUSUAL SHORTNESS OF BREATH *UNUSUAL BRUISING OR BLEEDING *URINARY PROBLEMS (pain or burning when urinating, or frequent urination) *BOWEL PROBLEMS (unusual diarrhea, constipation, pain near the anus) TENDERNESS IN MOUTH AND THROAT WITH OR WITHOUT PRESENCE OF ULCERS (sore throat, sores in mouth, or a toothache) UNUSUAL RASH, SWELLING OR PAIN  UNUSUAL VAGINAL DISCHARGE OR ITCHING   Items with * indicate a potential emergency and should be followed up as soon as possible or go to the Emergency Department if any problems should occur.  Please show the CHEMOTHERAPY ALERT CARD or IMMUNOTHERAPY ALERT CARD  at check-in to the Emergency Department and triage nurse.  Should you have questions after your visit or need to cancel or reschedule your appointment, please contact [facility]  Dept: [phone]  and follow the prompts.  Office hours are 8:00 a.m. to 4:30 p.m. Monday - Friday. Please note that voicemails left after 4:00 p.m. may not be returned until the following business day.  We are closed weekends and major holidays. You have access to a nurse at all times for urgent questions. Please call the main number to the clinic Dept: [phone] and follow the prompts.   For any non-urgent questions, you may also contact your provider using MyChart. We now offer e-Visits for anyone 18 and older to request care online for non-urgent symptoms. For details visit [email or web address].   Also download the MyChart app! Go to the app store, search "MyChart", open the app, select [facility], and log in with your MyChart username and password.  Due to Covid, a mask is required upon entering the hospital/clinic. If you do not have a mask, one will be given to you upon arrival. For doctor visits, patients may have 1 support person aged 18 or older with them. For treatment visits, patients cannot have anyone with them due to current Covid guidelines and our immunocompromised population.   

## 2020-11-17 NOTE — Progress Notes (Signed)
[facility] OFFICE PROGRESS NOTE  Patient Care Team: Pcp, No as PCP - General [name], RN as Oncology Nurse Navigator (Oncology)  ASSESSMENT & PLAN:  Uterine cancer [facility]) I have reviewed multiple imaging studies with the patient extensively She has excellent response to treatment, albeit with some side effects We will proceed with 3 more cycles of therapy before repeating imaging study after cycle 6  CKD (chronic kidney disease), stage III (Teresa Oliver) She had fluctuation of her serum creatinine I will adjust the dose of chemotherapy accordingly  Deficiency anemia She has multifactorial anemia, combination of thalassemia and recent anemia chronic illness She has significant symptomatic improvement after blood transfusion recently I will bring her back around day 10 after every cycle for blood count recheck and transfusion of 1 unit of blood if hemoglobin is less than 8 She is in agreement  Essential hypertension Her blood pressure is profoundly elevated today likely due to anxiety Monitor closely for now  Orders Placed This Encounter  Procedures   CMP ([place] only)    All questions were answered. The patient knows to call the clinic with any problems, questions or concerns. The total time spent in the appointment was 25 minutes encounter with patients including review of chart and various tests results, discussions about plan of care and coordination of care plan   [name], MD 11/17/2020 8:20 AM  INTERVAL HISTORY: Please see below for problem oriented charting. She returns to review test results and cycle 4 of chemo Since last time I saw her, she felt great The blood transfusion has given her extra energy She denies nausea She had mild constipation and is regulating her bowel movement with laxatives She is anxious about results of her CT imaging today  SUMMARY OF ONCOLOGIC HISTORY: Oncology History Overview Note  High grade serous MMR  normal Her2 negative   Uterine cancer ([place])  08/28/2020 Imaging   1. Pelvic mass measuring 7.5 x 4.8 cm, contiguous with the sigmoid colon and uterus with loss of fat planes. Site of origin is unclear, may be ovarian, uterine, or colonic. Lack of contrast limits detailed assessment. 2. Above pelvic mass causes partial obstruction of the left ureter with mild left hydroureteronephrosis. 3. Multifocal adenopathy in the abdomen and pelvis, suspicious for metastatic disease, primarily in the mesentery and lower peritoneum and pelvis. Please note that detailed assessment of adenopathy is limited on this noncontrast exam. Recommend oncology referral.  4. Wall thickening of the sigmoid colon distal to the left pelvic mass with pericolonic edema, suspicious for colitis. There is a moderate to large amount of stool in the more proximal colon, suggesting pelvic mass may be causing delayed colonic transit. 5. Small to moderate hiatal hernia.   08/28/2020 - 09/04/2020 Hospital Admission   The patient has been complaining of reduced urine output, loss of appetite and changes in bowel habits She had abnormal imaging study leading to hospitalization, colonoscopy, biopsy and subsequently chemotherapy   08/30/2020 Procedure   CT-guided biopsy of enlarged left external iliac chain lymph node as above   08/30/2020 Pathology Results   SURGICAL PATHOLOGY  CASE: [id number]  PATIENT: Teresa Oliver  Surgical Pathology Report  Clinical History: Pelvic mass with metastatic lymph adenopathy (crm)  FINAL MICROSCOPIC DIAGNOSIS:   A. LYMPH NODE, LEFT EXTERNAL ILIAC CHAIN, BIOPSY:  - Poorly differentiated carcinoma with necrosis.  - See comment.   COMMENT:  The carcinoma is characterized by diffuse sheets of malignant epithelioid cells with prominent nucleoli and  frequent mitotic figures. Immunohistochemistry is positive with cytokeratin 7 and PAX 8 with weak  positivity with estrogen receptor and WT1.  P53 shows  diffuse strong nuclear positivity.  The carcinoma is negative with cytokeratin 20, CDX2, GATA3, GCDFP, TTF-1, HepPar 1, arginase 1 and CD10.  The  morphology and immunophenotype are most consistent with a gynecologic primary including high-grade serous carcinoma.    08/31/2020 Procedure   Colonoscopy - Preparation of the colon was fair. - Likely malignant partially obstructing tumor in the sigmoid colon (appears extrinsic process). Biopsied. Tattooed. - Internal hemorrhoids. - Stool in the entire examined colon   09/01/2020 Initial Diagnosis   Uterine cancer ([place])    09/01/2020 Procedure   Successful placement of a power injectable Port-A-Cath via the right internal jugular vein. The catheter is ready for immediate use.   09/04/2020 -  Chemotherapy    Patient is on Treatment Plan: UTERINE CARBOPLATIN AUC 6 / PACLITAXEL Q21D       09/07/2020 Cancer Staging   Staging form: Corpus Uteri - Carcinoma and Carcinosarcoma, AJCC 8th Edition - Clinical: FIGO Stage IVA (cT4, cN2a, cM0) - Signed by [name], MD on 09/07/2020    11/15/2020 Imaging   1. Marked interval response to therapy with decrease in size of the mass extending from the uterus to the colon that was seen on the previous examination. Also with decreased size and or resolution of pelvic, mesenteric and retroperitoneal adenopathy seen on previous imaging. 2. Tethering of the colon with area that raise the question of developing colovaginal fistula versus internal enhancement within the mass that is now decreased in size. Attention on follow-up is suggested. Correlate with any current signs of vaginal drainage. 3. LEFT thoracic inlet adenopathy compatible with involvement in the chest. Area not imaged on previous imaging.     REVIEW OF SYSTEMS:   Constitutional: Denies fevers, chills or abnormal weight loss Eyes: Denies blurriness of vision Ears, nose, mouth, throat, and face: Denies mucositis or sore throat Respiratory: Denies  cough, dyspnea or wheezes Cardiovascular: Denies palpitation, chest discomfort or lower extremity swelling Gastrointestinal:  Denies nausea, heartburn or change in bowel habits Skin: Denies abnormal skin rashes Lymphatics: Denies new lymphadenopathy or easy bruising Neurological:Denies numbness, tingling or new weaknesses Behavioral/Psych: Mood is stable, no new changes  All other systems were reviewed with the patient and are negative.  I have reviewed the past medical history, past surgical history, social history and family history with the patient and they are unchanged from previous note.  ALLERGIES:  has No Known Allergies.  MEDICATIONS:  Current Outpatient Medications  Medication Sig Dispense Refill   amLODipine (NORVASC) 10 MG tablet Take 1 tablet (10 mg total) by mouth daily. 30 tablet 2   atenolol (TENORMIN) 50 MG tablet Take 1 tablet (50 mg total) by mouth daily. 30 tablet 2   cholecalciferol (VITAMIN D3) 25 MCG (1000 UNIT) tablet Take 1,000 Units by mouth daily.     dexamethasone (DECADRON) 4 MG tablet Take 2 tabs at the night before and 2 tab the morning of chemotherapy, every 3 weeks, by mouth x 6 cycles 36 tablet 6   lidocaine-prilocaine (EMLA) cream Apply 1 application topically daily as needed. 30 g 3   LORazepam (ATIVAN) 0.5 MG tablet TAKE 1 TABLET(0.5 MG) BY MOUTH TWICE DAILY AS NEEDED FOR ANXIETY 60 tablet 0   magnesium oxide (MAG-OX) 400 (240 Mg) MG tablet Take 1 tablet (400 mg total) by mouth 2 (two) times daily. 60 tablet 1  ondansetron (ZOFRAN) 8 MG tablet Take 1 tablet (8 mg total) by mouth every 8 (eight) hours as needed for nausea. 30 tablet 3   oxybutynin (DITROPAN) 5 MG tablet Take 1 tablet (5 mg total) by mouth every 8 (eight) hours as needed for bladder spasms. 90 tablet 1   polyethylene glycol (MIRALAX / GLYCOLAX) 17 g packet Take 17 g by mouth 2 (two) times daily as needed for moderate constipation. 14 each 0   prochlorperazine (COMPAZINE) 10 MG tablet Take  1 tablet (10 mg total) by mouth every 6 (six) hours as needed for nausea or vomiting. 30 tablet 1   senna-docusate (SENOKOT-S) 8.6-50 MG tablet Take 1 tablet by mouth 2 (two) times daily. 30 tablet 1   No current facility-administered medications for this visit.    PHYSICAL EXAMINATION: ECOG PERFORMANCE STATUS: 1 - Symptomatic but completely ambulatory  Vitals:   11/17/20 0807  BP: (!) 166/80  Pulse: 75  Resp: 18  Temp: (!) 97.4 F (36.3 C)  SpO2: 99%   Filed Weights   11/17/20 0807  Weight: 110 lb 6.4 oz (50.1 kg)    GENERAL:alert, no distress and comfortable SKIN: skin color, texture, turgor are normal, no rashes or significant lesions EYES: normal, Conjunctiva are pink and non-injected, sclera clear OROPHARYNX:no exudate, no erythema and lips, buccal mucosa, and tongue normal  NECK: supple, thyroid normal size, non-tender, without nodularity LYMPH:  no palpable lymphadenopathy in the cervical, axillary or inguinal LUNGS: clear to auscultation and percussion with normal breathing effort HEART: regular rate & rhythm and no murmurs and no lower extremity edema ABDOMEN:abdomen soft, non-tender and normal bowel sounds Musculoskeletal:no cyanosis of digits and no clubbing  NEURO: alert & oriented x 3 with fluent speech, no focal motor/sensory deficits  LABORATORY DATA:  I have reviewed the data as listed    Component Value Date/Time   NA 139 11/06/2020 0815   K 4.2 11/06/2020 0815   CL 105 11/06/2020 0815   CO2 26 11/06/2020 0815   GLUCOSE 106 (H) 11/06/2020 0815   BUN 32 (H) 11/06/2020 0815   CREATININE 1.23 (H) 11/06/2020 0815   CALCIUM 9.5 11/06/2020 0815   PROT 7.4 11/06/2020 0815   ALBUMIN 3.9 11/06/2020 0815   AST 20 11/06/2020 0815   ALT 13 11/06/2020 0815   ALKPHOS 69 11/06/2020 0815   BILITOT 0.5 11/06/2020 0815   GFRNONAA 46 (L) 11/06/2020 0815    No results found for: SPEP, UPEP  Lab Results  Component Value Date   WBC 6.9 11/17/2020   NEUTROABS  5.5 11/17/2020   HGB 10.2 (L) 11/17/2020   HCT 32.2 (L) 11/17/2020   MCV 73.7 (L) 11/17/2020   PLT 261 11/17/2020      Chemistry      Component Value Date/Time   NA 139 11/06/2020 0815   K 4.2 11/06/2020 0815   CL 105 11/06/2020 0815   CO2 26 11/06/2020 0815   BUN 32 (H) 11/06/2020 0815   CREATININE 1.23 (H) 11/06/2020 0815      Component Value Date/Time   CALCIUM 9.5 11/06/2020 0815   ALKPHOS 69 11/06/2020 0815   AST 20 11/06/2020 0815   ALT 13 11/06/2020 0815   BILITOT 0.5 11/06/2020 0815       RADIOGRAPHIC STUDIES: I have reviewed multiple imaging studies with the patient I have personally reviewed the radiological images as listed and agreed with the findings in the report. CT CHEST ABDOMEN PELVIS W CONTRAST  Result Date: 11/15/2020 CLINICAL DATA:  Uterine cancer follow-up in a 75 year old female, no reported symptoms. EXAM: CT CHEST, ABDOMEN, AND PELVIS WITH CONTRAST TECHNIQUE: Multidetector CT imaging of the chest, abdomen and pelvis was performed following the standard protocol during bolus administration of intravenous contrast. CONTRAST:  66m OMNIPAQUE IOHEXOL 300 MG/ML  SOLN COMPARISON:  August 28, 2020. FINDINGS: CT CHEST FINDINGS Cardiovascular: RIGHT-sided Port-A-Cath terminates at the caval to atrial junction. Aorta is normal caliber. Heart size normal without pericardial effusion. Central pulmonary vasculature is unremarkable on venous phase. Mediastinum/Nodes: LEFT thoracic inlet lymph node measures 11 mm with ovoid morphology (image 9/2) Smaller rounded lymph nodes are present in the LEFT supraclavicular region as well. No axillary lymphadenopathy. No signs of adenopathy in the mediastinum or hila. Esophagus grossly normal. RIGHT-sided Port-A-Cath terminates at the caval to atrial junction. Lungs/Pleura: No sign of consolidation or evidence of pleural effusion. Basilar atelectasis. Airways are patent. Lungs are clear. Musculoskeletal: See below for full musculoskeletal  details. CT ABDOMEN PELVIS FINDINGS Hepatobiliary: No focal, suspicious hepatic lesion. No pericholecystic stranding. No sign of biliary duct distension. Pancreas: Normal, without mass, inflammation or ductal dilatation. Spleen: Spleen is normal in size and contour without focal lesion. Adrenals/Urinary Tract: Adrenal glands are normal. Symmetric renal enhancement. No hydronephrosis. No nephrolithiasis. Urinary bladder is under distended limiting assessment but without gross abnormality. Stomach/Bowel: Stomach is unremarkable. No sign of small bowel abnormality. The appendix is normal. Proximal colon is normal. Thickening of the distal colon. Marked interval decrease in size of the mass extending from the uterus to the colon that was seen on the previous examination. See below for complete detail. No pericolonic stranding but with soft tissue bridging the colon and vaginal apex. Vascular/Lymphatic: Patent abdominal vessels. Marked interval decrease in size and or resolution of multiple retroperitoneal lymph nodes and mesenteric lymph nodes. LEFT pelvic sidewall, external iliac lymph node (image 89/2) as large is 2.3 cm short axis now 0.8 cm. No lymph nodes greater than a cm in the pelvis or in the retroperitoneum. Index LEFT periaortic lymph nodes seen on image 30 of series 2 on the prior exam in the 2-3 mm range in terms of short axis on today's study. No new signs of nodal disease Reproductive: Arising from the posterior uterus is a soft tissue mass that has diminished in size considerably since previous imaging now measuring approximately 3.1 x 3.1 cm greatest axial dimension previously as large as 7.5 x 4.8 cm (image 91/2) hypodense centrally and contacting the mid sigmoid colon on the current study with some increased attenuation along the margin of central hypodensity, bridging the colon and the upper vagina Inseparable from the mid sigmoid and the rectosigmoid junction. This measures approximately 3.0 cm in  craniocaudal dimension as compared to 5.5 cm on the previous study. Other: No ascites.  No peritoneal nodularity.  No free air. Musculoskeletal: No acute musculoskeletal process. No destructive bone finding. Spinal degenerative changes. Loss of height at T5 with chronic appearance. IMPRESSION: 1. Marked interval response to therapy with decrease in size of the mass extending from the uterus to the colon that was seen on the previous examination. Also with decreased size and or resolution of pelvic, mesenteric and retroperitoneal adenopathy seen on previous imaging. 2. Tethering of the colon with area that raise the question of developing colovaginal fistula versus internal enhancement within the mass that is now decreased in size. Attention on follow-up is suggested. Correlate with any current signs of vaginal drainage. 3. LEFT thoracic inlet adenopathy compatible with involvement in the chest.  Area not imaged on previous imaging. Electronically Signed   By: Zetta Bills M.D.   On: 11/15/2020 13:55

## 2020-11-17 NOTE — Assessment & Plan Note (Signed)
I have reviewed multiple imaging studies with the patient extensively She has excellent response to treatment, albeit with some side effects We will proceed with 3 more cycles of therapy before repeating imaging study after cycle 6

## 2020-11-17 NOTE — Telephone Encounter (Signed)
Scheduled appointment per 07/01 sch msg. Patient is aware.

## 2020-11-17 NOTE — Assessment & Plan Note (Signed)
Her blood pressure is profoundly elevated today likely due to anxiety Monitor closely for now

## 2020-11-17 NOTE — Assessment & Plan Note (Signed)
She had fluctuation of her serum creatinine I will adjust the dose of chemotherapy accordingly

## 2020-11-17 NOTE — Assessment & Plan Note (Signed)
She has multifactorial anemia, combination of thalassemia and recent anemia chronic illness She has significant symptomatic improvement after blood transfusion recently I will bring her back around day 10 after every cycle for blood count recheck and transfusion of 1 unit of blood if hemoglobin is less than 8 She is in agreement

## 2020-12-08 ENCOUNTER — Encounter: Payer: Self-pay | Admitting: Hematology and Oncology

## 2020-12-08 ENCOUNTER — Inpatient Hospital Stay: Payer: Medicare Other

## 2020-12-08 ENCOUNTER — Inpatient Hospital Stay: Payer: Medicare Other | Admitting: Hematology and Oncology

## 2020-12-08 ENCOUNTER — Other Ambulatory Visit: Payer: Self-pay | Admitting: Hematology and Oncology

## 2020-12-08 ENCOUNTER — Other Ambulatory Visit: Payer: Self-pay

## 2020-12-08 DIAGNOSIS — C549 Malignant neoplasm of corpus uteri, unspecified: Secondary | ICD-10-CM

## 2020-12-08 DIAGNOSIS — N183 Chronic kidney disease, stage 3 unspecified: Secondary | ICD-10-CM | POA: Diagnosis not present

## 2020-12-08 DIAGNOSIS — Z5111 Encounter for antineoplastic chemotherapy: Secondary | ICD-10-CM | POA: Diagnosis not present

## 2020-12-08 DIAGNOSIS — I10 Essential (primary) hypertension: Secondary | ICD-10-CM | POA: Diagnosis not present

## 2020-12-08 DIAGNOSIS — D539 Nutritional anemia, unspecified: Secondary | ICD-10-CM

## 2020-12-08 DIAGNOSIS — N179 Acute kidney failure, unspecified: Secondary | ICD-10-CM

## 2020-12-08 LAB — CBC WITH DIFFERENTIAL/PLATELET
Abs Immature Granulocytes: 0.07 10*3/uL (ref 0.00–0.07)
Basophils Absolute: 0 10*3/uL (ref 0.0–0.1)
Basophils Relative: 0 %
Eosinophils Absolute: 0 10*3/uL (ref 0.0–0.5)
Eosinophils Relative: 0 %
HCT: 29 % — ABNORMAL LOW (ref 36.0–46.0)
Hemoglobin: 9.1 g/dL — ABNORMAL LOW (ref 12.0–15.0)
Immature Granulocytes: 1 %
Lymphocytes Relative: 19 %
Lymphs Abs: 0.9 10*3/uL (ref 0.7–4.0)
MCH: 23.2 pg — ABNORMAL LOW (ref 26.0–34.0)
MCHC: 31.4 g/dL (ref 30.0–36.0)
MCV: 74 fL — ABNORMAL LOW (ref 80.0–100.0)
Monocytes Absolute: 0.1 10*3/uL (ref 0.1–1.0)
Monocytes Relative: 3 %
Neutro Abs: 3.9 10*3/uL (ref 1.7–7.7)
Neutrophils Relative %: 77 %
Platelets: 176 10*3/uL (ref 150–400)
RBC: 3.92 MIL/uL (ref 3.87–5.11)
RDW: 19.7 % — ABNORMAL HIGH (ref 11.5–15.5)
WBC: 5 10*3/uL (ref 4.0–10.5)
nRBC: 0 % (ref 0.0–0.2)

## 2020-12-08 LAB — CMP (CANCER CENTER ONLY)
ALT: 19 U/L (ref 0–44)
AST: 27 U/L (ref 15–41)
Albumin: 4.1 g/dL (ref 3.5–5.0)
Alkaline Phosphatase: 72 U/L (ref 38–126)
Anion gap: 9 (ref 5–15)
BUN: 39 mg/dL — ABNORMAL HIGH (ref 8–23)
CO2: 24 mmol/L (ref 22–32)
Calcium: 9.9 mg/dL (ref 8.9–10.3)
Chloride: 105 mmol/L (ref 98–111)
Creatinine: 1.17 mg/dL — ABNORMAL HIGH (ref 0.44–1.00)
GFR, Estimated: 49 mL/min — ABNORMAL LOW (ref >60)
Glucose, Bld: 145 mg/dL — ABNORMAL HIGH (ref 70–99)
Potassium: 4.5 mmol/L (ref 3.5–5.1)
Sodium: 138 mmol/L (ref 135–145)
Total Bilirubin: 0.5 mg/dL (ref 0.3–1.2)
Total Protein: 7.7 g/dL (ref 6.5–8.1)

## 2020-12-08 LAB — SAMPLE TO BLOOD BANK

## 2020-12-08 MED ORDER — AMLODIPINE BESYLATE 10 MG PO TABS: 10.0000 mg | ORAL_TABLET | Freq: Every day | ORAL | 2 refills | Status: DC

## 2020-12-08 MED ORDER — DEXAMETHASONE SODIUM PHOSPHATE 100 MG/10ML IJ SOLN
10.0000 mg | Freq: Once | INTRAMUSCULAR | Status: AC
  Administered 2020-12-08: 10 mg via INTRAVENOUS
  Filled 2020-12-08: qty 10

## 2020-12-08 MED ORDER — FAMOTIDINE 20 MG IN NS 100 ML IVPB
INTRAVENOUS | Status: AC
  Filled 2020-12-08: qty 100

## 2020-12-08 MED ORDER — PALONOSETRON HCL INJECTION 0.25 MG/5ML
INTRAVENOUS | Status: AC
  Filled 2020-12-08: qty 5

## 2020-12-08 MED ORDER — DIPHENHYDRAMINE HCL 50 MG/ML IJ SOLN
12.5000 mg | Freq: Once | INTRAMUSCULAR | Status: AC
  Administered 2020-12-08: 12.5 mg via INTRAVENOUS

## 2020-12-08 MED ORDER — SODIUM CHLORIDE 0.9 % IV SOLN
250.0000 mg | Freq: Once | INTRAVENOUS | Status: AC
  Administered 2020-12-08: 250 mg via INTRAVENOUS
  Filled 2020-12-08: qty 25

## 2020-12-08 MED ORDER — SODIUM CHLORIDE 0.9% FLUSH
10.0000 mL | INTRAVENOUS | Status: DC | PRN
  Administered 2020-12-08: 10 mL
  Filled 2020-12-08: qty 10

## 2020-12-08 MED ORDER — SODIUM CHLORIDE 0.9% FLUSH
10.0000 mL | Freq: Once | INTRAVENOUS | Status: AC
  Administered 2020-12-08: 10 mL
  Filled 2020-12-08: qty 10

## 2020-12-08 MED ORDER — HEPARIN SOD (PORK) LOCK FLUSH 100 UNIT/ML IV SOLN
500.0000 [IU] | Freq: Once | INTRAVENOUS | Status: AC | PRN
  Administered 2020-12-08: 500 [IU]
  Filled 2020-12-08: qty 5

## 2020-12-08 MED ORDER — FAMOTIDINE 20 MG IN NS 100 ML IVPB
20.0000 mg | Freq: Once | INTRAVENOUS | Status: AC
  Administered 2020-12-08: 20 mg via INTRAVENOUS

## 2020-12-08 MED ORDER — DIPHENHYDRAMINE HCL 50 MG/ML IJ SOLN
INTRAMUSCULAR | Status: AC
  Filled 2020-12-08: qty 1

## 2020-12-08 MED ORDER — SODIUM CHLORIDE 0.9 % IV SOLN
150.0000 mg | Freq: Once | INTRAVENOUS | Status: AC
  Administered 2020-12-08: 150 mg via INTRAVENOUS
  Filled 2020-12-08: qty 150

## 2020-12-08 MED ORDER — SODIUM CHLORIDE 0.9 % IV SOLN
175.0000 mg/m2 | Freq: Once | INTRAVENOUS | Status: AC
  Administered 2020-12-08: 258 mg via INTRAVENOUS
  Filled 2020-12-08: qty 43

## 2020-12-08 MED ORDER — PALONOSETRON HCL INJECTION 0.25 MG/5ML
0.2500 mg | Freq: Once | INTRAVENOUS | Status: AC
  Administered 2020-12-08: 0.25 mg via INTRAVENOUS

## 2020-12-08 NOTE — Progress Notes (Signed)
[place] OFFICE PROGRESS NOTE  Patient Care Team: Pcp, No as PCP - General [name], RN as Oncology Nurse Navigator (Oncology)  ASSESSMENT & PLAN:  Uterine cancer [facility]) I have reviewed multiple imaging studies with the patient extensively We will proceed with a few more cycles of therapy before repeating imaging study after cycle 6  Deficiency anemia She has multifactorial anemia, combination of thalassemia and recent anemia chronic illness She has significant symptomatic improvement after blood transfusion recently I will bring her back around day 10 after every cycle for blood count recheck and transfusion of 1 unit of blood if hemoglobin is less than 8 She is in agreement  Essential hypertension Her blood pressure is profoundly elevated today likely due to anxiety Her documented blood pressure from home was within normal range Monitor closely for now  CKD (chronic kidney disease), stage III ([place]) She had fluctuation of her serum creatinine I will adjust the dose of chemotherapy accordingly  No orders of the defined types were placed in this encounter.   All questions were answered. The patient knows to call the clinic with any problems, questions or concerns. The total time spent in the appointment was 20 minutes encounter with patients including review of chart and various tests results, discussions about plan of care and coordination of care plan   [name], MD 12/08/2020 8:59 AM  INTERVAL HISTORY: Please see below for problem oriented charting. She returns for treatment follow-up She is doing well She has gained some weight No nausea Denies recent constipation No peripheral neuropathy   SUMMARY OF ONCOLOGIC HISTORY: Oncology History Overview Note  High grade serous MMR normal Her2 negative   Uterine cancer ([place])  08/28/2020 Imaging   1. Pelvic mass measuring 7.5 x 4.8 cm, contiguous with the sigmoid colon and uterus with loss of fat  planes. Site of origin is unclear, may be ovarian, uterine, or colonic. Lack of contrast limits detailed assessment. 2. Above pelvic mass causes partial obstruction of the left ureter with mild left hydroureteronephrosis. 3. Multifocal adenopathy in the abdomen and pelvis, suspicious for metastatic disease, primarily in the mesentery and lower peritoneum and pelvis. Please note that detailed assessment of adenopathy is limited on this noncontrast exam. Recommend oncology referral.  4. Wall thickening of the sigmoid colon distal to the left pelvic mass with pericolonic edema, suspicious for colitis. There is a moderate to large amount of stool in the more proximal colon, suggesting pelvic mass may be causing delayed colonic transit. 5. Small to moderate hiatal hernia.   08/28/2020 - 09/04/2020 Hospital Admission   The patient has been complaining of reduced urine output, loss of appetite and changes in bowel habits She had abnormal imaging study leading to hospitalization, colonoscopy, biopsy and subsequently chemotherapy   08/30/2020 Procedure   CT-guided biopsy of enlarged left external iliac chain lymph node as above   08/30/2020 Pathology Results   SURGICAL PATHOLOGY  CASE: [id number]  PATIENT: Teresa Oliver  Surgical Pathology Report  Clinical History: Pelvic mass with metastatic lymph adenopathy ([facility])  FINAL MICROSCOPIC DIAGNOSIS:   A. LYMPH NODE, LEFT EXTERNAL ILIAC CHAIN, BIOPSY:  - Poorly differentiated carcinoma with necrosis.  - See comment.   COMMENT:  The carcinoma is characterized by diffuse sheets of malignant epithelioid cells with prominent nucleoli and frequent mitotic figures. Immunohistochemistry is positive with cytokeratin 7 and PAX 8 with weak  positivity with estrogen receptor and WT1.  P53 shows diffuse strong nuclear positivity.  The carcinoma is  negative with cytokeratin 20, CDX2, GATA3, GCDFP, TTF-1, HepPar 1, arginase 1 and CD10.  The  morphology and  immunophenotype are most consistent with a gynecologic primary including high-grade serous carcinoma.    08/31/2020 Procedure   Colonoscopy - Preparation of the colon was fair. - Likely malignant partially obstructing tumor in the sigmoid colon (appears extrinsic process). Biopsied. Tattooed. - Internal hemorrhoids. - Stool in the entire examined colon   09/01/2020 Initial Diagnosis   Uterine cancer ([place])    09/01/2020 Procedure   Successful placement of a power injectable Port-A-Cath via the right internal jugular vein. The catheter is ready for immediate use.   09/04/2020 -  Chemotherapy    Patient is on Treatment Plan: UTERINE CARBOPLATIN AUC 6 / PACLITAXEL Q21D       09/07/2020 Cancer Staging   Staging form: Corpus Uteri - Carcinoma and Carcinosarcoma, AJCC 8th Edition - Clinical: FIGO Stage IVA (cT4, cN2a, cM0) - Signed by [name], MD on 09/07/2020    11/15/2020 Imaging   1. Marked interval response to therapy with decrease in size of the mass extending from the uterus to the colon that was seen on the previous examination. Also with decreased size and or resolution of pelvic, mesenteric and retroperitoneal adenopathy seen on previous imaging. 2. Tethering of the colon with area that raise the question of developing colovaginal fistula versus internal enhancement within the mass that is now decreased in size. Attention on follow-up is suggested. Correlate with any current signs of vaginal drainage. 3. LEFT thoracic inlet adenopathy compatible with involvement in the chest. Area not imaged on previous imaging.     REVIEW OF SYSTEMS:   Constitutional: Denies fevers, chills or abnormal weight loss Eyes: Denies blurriness of vision Ears, nose, mouth, throat, and face: Denies mucositis or sore throat Respiratory: Denies cough, dyspnea or wheezes Cardiovascular: Denies palpitation, chest discomfort or lower extremity swelling Gastrointestinal:  Denies nausea, heartburn or change  in bowel habits Skin: Denies abnormal skin rashes Lymphatics: Denies new lymphadenopathy or easy bruising Neurological:Denies numbness, tingling or new weaknesses Behavioral/Psych: Mood is stable, no new changes  All other systems were reviewed with the patient and are negative.  I have reviewed the past medical history, past surgical history, social history and family history with the patient and they are unchanged from previous note.  ALLERGIES:  has No Known Allergies.  MEDICATIONS:  Current Outpatient Medications  Medication Sig Dispense Refill   amLODipine (NORVASC) 10 MG tablet Take 1 tablet (10 mg total) by mouth daily. 90 tablet 2   atenolol (TENORMIN) 50 MG tablet Take 1 tablet (50 mg total) by mouth daily. 30 tablet 2   cholecalciferol (VITAMIN D3) 25 MCG (1000 UNIT) tablet Take 1,000 Units by mouth daily.     dexamethasone (DECADRON) 4 MG tablet Take 2 tabs at the night before and 2 tab the morning of chemotherapy, every 3 weeks, by mouth x 6 cycles 36 tablet 6   lidocaine-prilocaine (EMLA) cream Apply 1 application topically daily as needed. 30 g 3   LORazepam (ATIVAN) 0.5 MG tablet TAKE 1 TABLET(0.5 MG) BY MOUTH TWICE DAILY AS NEEDED FOR ANXIETY 60 tablet 0   magnesium oxide (MAG-OX) 400 (240 Mg) MG tablet Take 1 tablet (400 mg total) by mouth 2 (two) times daily. 60 tablet 1   ondansetron (ZOFRAN) 8 MG tablet Take 1 tablet (8 mg total) by mouth every 8 (eight) hours as needed for nausea. 30 tablet 3   oxybutynin (DITROPAN) 5 MG tablet Take  1 tablet (5 mg total) by mouth every 8 (eight) hours as needed for bladder spasms. 90 tablet 1   polyethylene glycol (MIRALAX / GLYCOLAX) 17 g packet Take 17 g by mouth 2 (two) times daily as needed for moderate constipation. 14 each 0   prochlorperazine (COMPAZINE) 10 MG tablet Take 1 tablet (10 mg total) by mouth every 6 (six) hours as needed for nausea or vomiting. 30 tablet 1   senna-docusate (SENOKOT-S) 8.6-50 MG tablet Take 1 tablet by  mouth 2 (two) times daily. 30 tablet 1   No current facility-administered medications for this visit.    PHYSICAL EXAMINATION: ECOG PERFORMANCE STATUS: 0 - Asymptomatic  Vitals:   12/08/20 0808  BP: (!) 160/96  Pulse: 79  Resp: 18  Temp: (!) 97.5 F (36.4 C)  SpO2: 100%   Filed Weights   12/08/20 0808  Weight: 115 lb (52.2 kg)    GENERAL:alert, no distress and comfortable SKIN: skin color, texture, turgor are normal, no rashes or significant lesions EYES: normal, Conjunctiva are pink and non-injected, sclera clear OROPHARYNX:no exudate, no erythema and lips, buccal mucosa, and tongue normal  NECK: supple, thyroid normal size, non-tender, without nodularity LYMPH:  no palpable lymphadenopathy in the cervical, axillary or inguinal LUNGS: clear to auscultation and percussion with normal breathing effort HEART: regular rate & rhythm and no murmurs and no lower extremity edema ABDOMEN:abdomen soft, non-tender and normal bowel sounds Musculoskeletal:no cyanosis of digits and no clubbing  NEURO: alert & oriented x 3 with fluent speech, no focal motor/sensory deficits  LABORATORY DATA:  I have reviewed the data as listed    Component Value Date/Time   NA 138 12/08/2020 0735   K 4.5 12/08/2020 0735   CL 105 12/08/2020 0735   CO2 24 12/08/2020 0735   GLUCOSE 145 (H) 12/08/2020 0735   BUN 39 (H) 12/08/2020 0735   CREATININE 1.17 (H) 12/08/2020 0735   CALCIUM 9.9 12/08/2020 0735   PROT 7.7 12/08/2020 0735   ALBUMIN 4.1 12/08/2020 0735   AST 27 12/08/2020 0735   ALT 19 12/08/2020 0735   ALKPHOS 72 12/08/2020 0735   BILITOT 0.5 12/08/2020 0735   GFRNONAA 49 (L) 12/08/2020 0735    No results found for: SPEP, UPEP  Lab Results  Component Value Date   WBC 5.0 12/08/2020   NEUTROABS 3.9 12/08/2020   HGB 9.1 (L) 12/08/2020   HCT 29.0 (L) 12/08/2020   MCV 74.0 (L) 12/08/2020   PLT 176 12/08/2020      Chemistry      Component Value Date/Time   NA 138 12/08/2020 0735    K 4.5 12/08/2020 0735   CL 105 12/08/2020 0735   CO2 24 12/08/2020 0735   BUN 39 (H) 12/08/2020 0735   CREATININE 1.17 (H) 12/08/2020 0735      Component Value Date/Time   CALCIUM 9.9 12/08/2020 0735   ALKPHOS 72 12/08/2020 0735   AST 27 12/08/2020 0735   ALT 19 12/08/2020 0735   BILITOT 0.5 12/08/2020 0735       RADIOGRAPHIC STUDIES: I have personally reviewed the radiological images as listed and agreed with the findings in the report. CT CHEST ABDOMEN PELVIS W CONTRAST  Result Date: 11/15/2020 CLINICAL DATA:  Uterine cancer follow-up in a 75 year old female, no reported symptoms. EXAM: CT CHEST, ABDOMEN, AND PELVIS WITH CONTRAST TECHNIQUE: Multidetector CT imaging of the chest, abdomen and pelvis was performed following the standard protocol during bolus administration of intravenous contrast. CONTRAST:  19m OMNIPAQUE IOHEXOL 300  MG/ML  SOLN COMPARISON:  August 28, 2020. FINDINGS: CT CHEST FINDINGS Cardiovascular: RIGHT-sided Port-A-Cath terminates at the caval to atrial junction. Aorta is normal caliber. Heart size normal without pericardial effusion. Central pulmonary vasculature is unremarkable on venous phase. Mediastinum/Nodes: LEFT thoracic inlet lymph node measures 11 mm with ovoid morphology (image 9/2) Smaller rounded lymph nodes are present in the LEFT supraclavicular region as well. No axillary lymphadenopathy. No signs of adenopathy in the mediastinum or hila. Esophagus grossly normal. RIGHT-sided Port-A-Cath terminates at the caval to atrial junction. Lungs/Pleura: No sign of consolidation or evidence of pleural effusion. Basilar atelectasis. Airways are patent. Lungs are clear. Musculoskeletal: See below for full musculoskeletal details. CT ABDOMEN PELVIS FINDINGS Hepatobiliary: No focal, suspicious hepatic lesion. No pericholecystic stranding. No sign of biliary duct distension. Pancreas: Normal, without mass, inflammation or ductal dilatation. Spleen: Spleen is normal in  size and contour without focal lesion. Adrenals/Urinary Tract: Adrenal glands are normal. Symmetric renal enhancement. No hydronephrosis. No nephrolithiasis. Urinary bladder is under distended limiting assessment but without gross abnormality. Stomach/Bowel: Stomach is unremarkable. No sign of small bowel abnormality. The appendix is normal. Proximal colon is normal. Thickening of the distal colon. Marked interval decrease in size of the mass extending from the uterus to the colon that was seen on the previous examination. See below for complete detail. No pericolonic stranding but with soft tissue bridging the colon and vaginal apex. Vascular/Lymphatic: Patent abdominal vessels. Marked interval decrease in size and or resolution of multiple retroperitoneal lymph nodes and mesenteric lymph nodes. LEFT pelvic sidewall, external iliac lymph node (image 89/2) as large is 2.3 cm short axis now 0.8 cm. No lymph nodes greater than a cm in the pelvis or in the retroperitoneum. Index LEFT periaortic lymph nodes seen on image 30 of series 2 on the prior exam in the 2-3 mm range in terms of short axis on today's study. No new signs of nodal disease Reproductive: Arising from the posterior uterus is a soft tissue mass that has diminished in size considerably since previous imaging now measuring approximately 3.1 x 3.1 cm greatest axial dimension previously as large as 7.5 x 4.8 cm (image 91/2) hypodense centrally and contacting the mid sigmoid colon on the current study with some increased attenuation along the margin of central hypodensity, bridging the colon and the upper vagina Inseparable from the mid sigmoid and the rectosigmoid junction. This measures approximately 3.0 cm in craniocaudal dimension as compared to 5.5 cm on the previous study. Other: No ascites.  No peritoneal nodularity.  No free air. Musculoskeletal: No acute musculoskeletal process. No destructive bone finding. Spinal degenerative changes. Loss of  height at T5 with chronic appearance. IMPRESSION: 1. Marked interval response to therapy with decrease in size of the mass extending from the uterus to the colon that was seen on the previous examination. Also with decreased size and or resolution of pelvic, mesenteric and retroperitoneal adenopathy seen on previous imaging. 2. Tethering of the colon with area that raise the question of developing colovaginal fistula versus internal enhancement within the mass that is now decreased in size. Attention on follow-up is suggested. Correlate with any current signs of vaginal drainage. 3. LEFT thoracic inlet adenopathy compatible with involvement in the chest. Area not imaged on previous imaging. Electronically Signed   By: Zetta Bills M.D.   On: 11/15/2020 13:55

## 2020-12-08 NOTE — Assessment & Plan Note (Signed)
She had fluctuation of her serum creatinine I will adjust the dose of chemotherapy accordingly

## 2020-12-08 NOTE — Assessment & Plan Note (Signed)
Her blood pressure is profoundly elevated today likely due to anxiety Her documented blood pressure from home was within normal range Monitor closely for now

## 2020-12-08 NOTE — Assessment & Plan Note (Signed)
She has multifactorial anemia, combination of thalassemia and recent anemia chronic illness She has significant symptomatic improvement after blood transfusion recently I will bring her back around day 10 after every cycle for blood count recheck and transfusion of 1 unit of blood if hemoglobin is less than 8 She is in agreement

## 2020-12-08 NOTE — Assessment & Plan Note (Addendum)
Her blood pressure is profoundly elevated today likely due to anxiety Her home blood pressure were within normal limits Monitor closely for now I refilled her prescription of amlodipine

## 2020-12-08 NOTE — Assessment & Plan Note (Signed)
She has excellent response to treatment, albeit with some side effects We will proceed with a few more cycles of therapy before repeating imaging study after cycle 6

## 2020-12-08 NOTE — Patient Instructions (Signed)
[facility]ICAL [facility]COLOG[facility]  Discharge Instructions: Thank you for choosing [facility] to provide your oncology and hematology care.   If you have a lab appointment with the Cancer Center, please go directly to the Cancer Center and check in at the registration area.   Wear comfortable clothing and clothing appropriate for easy access to any Portacath or PICC line.   We strive to give you quality time with your provider. You may need to reschedule your appointment if you arrive late (15 or more minutes).  Arriving late affects you and other patients whose appointments are after yours.  Also, if you miss three or more appointments without notifying the office, you may be dismissed from the clinic at the provider's discretion.      For prescription refill requests, have your pharmacy contact our office and allow 72 hours for refills to be completed.    Today you received the following chemotherapy and/or immunotherapy agents : Taxol, Carboplatin   To help prevent nausea and vomiting after your treatment, we encourage you to take your nausea medication as directed.  BELOW ARE SYMPTOMS THAT SHOULD BE REPORTED IMMEDIATELY: *FEVER GREATER THAN 100.4 F (38 C) OR HIGHER *CHILLS OR SWEATING *NAUSEA AND VOMITING THAT IS NOT CONTROLLED WITH YOUR NAUSEA MEDICATION *UNUSUAL SHORTNESS OF BREATH *UNUSUAL BRUISING OR BLEEDING *URINARY PROBLEMS (pain or burning when urinating, or frequent urination) *BOWEL PROBLEMS (unusual diarrhea, constipation, pain near the anus) TENDERNESS IN MOUTH AND THROAT WITH OR WITHOUT PRESENCE OF ULCERS (sore throat, sores in mouth, or a toothache) UNUSUAL RASH, SWELLING OR PAIN  UNUSUAL VAGINAL DISCHARGE OR ITCHING   Items with * indicate a potential emergency and should be followed up as soon as possible or go to the Emergency Department if any problems should occur.  Please show the CHEMOTHERAPY ALERT CARD or IMMUNOTHERAPY ALERT CARD at  check-in to the Emergency Department and triage nurse.  Should you have questions after your visit or need to cancel or reschedule your appointment, please contact [facility]  Dept: [phone]  and follow the prompts.  Office hours are 8:00 a.m. to 4:30 p.m. Monday - Friday. Please note that voicemails left after 4:00 p.m. may not be returned until the following business day.  We are closed weekends and major holidays. You have access to a nurse at all times for urgent questions. Please call the main number to the clinic Dept: [phone] and follow the prompts.   For any non-urgent questions, you may also contact your provider using MyChart. We now offer e-Visits for anyone 18 and older to request care online for non-urgent symptoms. For details visit [email or web address].   Also download the MyChart app! Go to the app store, search "MyChart", open the app, select [facility], and log in with your MyChart username and password.  Due to Covid, a mask is required upon entering the hospital/clinic. If you do not have a mask, one will be given to you upon arrival. For doctor visits, patients may have 1 support person aged 18 or older with them. For treatment visits, patients cannot have anyone with them due to current Covid guidelines and our immunocompromised population.   

## 2020-12-08 NOTE — Assessment & Plan Note (Signed)
I have reviewed multiple imaging studies with the patient extensively We will proceed with a few more cycles of therapy before repeating imaging study after cycle 6

## 2020-12-18 ENCOUNTER — Inpatient Hospital Stay: Payer: Medicare Other | Attending: Hematology and Oncology

## 2020-12-18 ENCOUNTER — Other Ambulatory Visit: Payer: Self-pay

## 2020-12-18 ENCOUNTER — Inpatient Hospital Stay: Payer: Medicare Other

## 2020-12-18 ENCOUNTER — Other Ambulatory Visit: Payer: Self-pay | Admitting: Hematology and Oncology

## 2020-12-18 DIAGNOSIS — F411 Generalized anxiety disorder: Secondary | ICD-10-CM | POA: Diagnosis not present

## 2020-12-18 DIAGNOSIS — Z5111 Encounter for antineoplastic chemotherapy: Secondary | ICD-10-CM | POA: Diagnosis not present

## 2020-12-18 DIAGNOSIS — D539 Nutritional anemia, unspecified: Secondary | ICD-10-CM | POA: Diagnosis not present

## 2020-12-18 DIAGNOSIS — C549 Malignant neoplasm of corpus uteri, unspecified: Secondary | ICD-10-CM

## 2020-12-18 DIAGNOSIS — C55 Malignant neoplasm of uterus, part unspecified: Secondary | ICD-10-CM | POA: Diagnosis present

## 2020-12-18 DIAGNOSIS — N183 Chronic kidney disease, stage 3 unspecified: Secondary | ICD-10-CM | POA: Insufficient documentation

## 2020-12-18 DIAGNOSIS — Z79899 Other long term (current) drug therapy: Secondary | ICD-10-CM | POA: Diagnosis not present

## 2020-12-18 DIAGNOSIS — N179 Acute kidney failure, unspecified: Secondary | ICD-10-CM

## 2020-12-18 LAB — COMPREHENSIVE METABOLIC PANEL
ALT: 14 U/L (ref 0–44)
AST: 18 U/L (ref 15–41)
Albumin: 3.8 g/dL (ref 3.5–5.0)
Alkaline Phosphatase: 66 U/L (ref 38–126)
Anion gap: 9 (ref 5–15)
BUN: 34 mg/dL — ABNORMAL HIGH (ref 8–23)
CO2: 25 mmol/L (ref 22–32)
Calcium: 9.9 mg/dL (ref 8.9–10.3)
Chloride: 106 mmol/L (ref 98–111)
Creatinine, Ser: 1.26 mg/dL — ABNORMAL HIGH (ref 0.44–1.00)
GFR, Estimated: 45 mL/min — ABNORMAL LOW (ref >60)
Glucose, Bld: 90 mg/dL (ref 70–99)
Potassium: 4.4 mmol/L (ref 3.5–5.1)
Sodium: 140 mmol/L (ref 135–145)
Total Bilirubin: 0.3 mg/dL (ref 0.3–1.2)
Total Protein: 7 g/dL (ref 6.5–8.1)

## 2020-12-18 LAB — CBC WITH DIFFERENTIAL/PLATELET
Abs Immature Granulocytes: 0 10*3/uL (ref 0.00–0.07)
Basophils Absolute: 0 10*3/uL (ref 0.0–0.1)
Basophils Relative: 1 %
Eosinophils Absolute: 0.1 10*3/uL (ref 0.0–0.5)
Eosinophils Relative: 3 %
HCT: 25.4 % — ABNORMAL LOW (ref 36.0–46.0)
Hemoglobin: 7.9 g/dL — ABNORMAL LOW (ref 12.0–15.0)
Immature Granulocytes: 0 %
Lymphocytes Relative: 48 %
Lymphs Abs: 1 10*3/uL (ref 0.7–4.0)
MCH: 22.8 pg — ABNORMAL LOW (ref 26.0–34.0)
MCHC: 31.1 g/dL (ref 30.0–36.0)
MCV: 73.4 fL — ABNORMAL LOW (ref 80.0–100.0)
Monocytes Absolute: 0.2 10*3/uL (ref 0.1–1.0)
Monocytes Relative: 11 %
Neutro Abs: 0.8 10*3/uL — ABNORMAL LOW (ref 1.7–7.7)
Neutrophils Relative %: 37 %
Platelets: 153 10*3/uL (ref 150–400)
RBC: 3.46 MIL/uL — ABNORMAL LOW (ref 3.87–5.11)
RDW: 18.5 % — ABNORMAL HIGH (ref 11.5–15.5)
Smear Review: NORMAL
WBC: 2.1 10*3/uL — ABNORMAL LOW (ref 4.0–10.5)
nRBC: 0 % (ref 0.0–0.2)

## 2020-12-18 LAB — SAMPLE TO BLOOD BANK

## 2020-12-18 LAB — PREPARE RBC (CROSSMATCH)

## 2020-12-18 MED ORDER — ACETAMINOPHEN 325 MG PO TABS
ORAL_TABLET | ORAL | Status: AC
  Filled 2020-12-18: qty 2

## 2020-12-18 MED ORDER — DIPHENHYDRAMINE HCL 25 MG PO CAPS
ORAL_CAPSULE | ORAL | Status: AC
  Filled 2020-12-18: qty 1

## 2020-12-18 MED ORDER — ACETAMINOPHEN 325 MG PO TABS
650.0000 mg | ORAL_TABLET | Freq: Once | ORAL | Status: AC
Start: 2020-12-18 — End: 2020-12-18
  Administered 2020-12-18: 650 mg via ORAL

## 2020-12-18 MED ORDER — HEPARIN SOD (PORK) LOCK FLUSH 100 UNIT/ML IV SOLN
500.0000 [IU] | Freq: Every day | INTRAVENOUS | Status: AC | PRN
  Administered 2020-12-18: 500 [IU]
  Filled 2020-12-18: qty 5

## 2020-12-18 MED ORDER — SODIUM CHLORIDE 0.9% FLUSH
10.0000 mL | INTRAVENOUS | Status: AC | PRN
  Administered 2020-12-18: 10 mL
  Filled 2020-12-18: qty 10

## 2020-12-18 MED ORDER — SODIUM CHLORIDE 0.9% IV SOLUTION
250.0000 mL | Freq: Once | INTRAVENOUS | Status: AC
  Administered 2020-12-18: 250 mL via INTRAVENOUS
  Filled 2020-12-18: qty 250

## 2020-12-18 MED ORDER — DIPHENHYDRAMINE HCL 25 MG PO CAPS
25.0000 mg | ORAL_CAPSULE | Freq: Once | ORAL | Status: AC
  Administered 2020-12-18: 25 mg via ORAL

## 2020-12-18 NOTE — Patient Instructions (Signed)
Blood Transfusion, Adult A blood transfusion is a procedure in which you receive blood or a type of blood cell (blood component) through an IV. You may need a blood transfusion when your blood level is low. This may result from a bleeding disorder, illness, injury, or surgery. The blood may come from a donor. You may also be able to donate blood for yourself (autologous blood donation) before a planned surgery. The blood given in a transfusion is made up of different blood components. You may receive: Red blood cells. These carry oxygen to the cells in the body. Platelets. These help your blood to clot. Plasma. This is the liquid part of your blood. It carries proteins and other substances throughout the body. White blood cells. These help you fight infections. If you have hemophilia or another clotting disorder, you may also receive other types of blood products. Tell a health care provider about: Any blood disorders you have. Any previous reactions you have had during a blood transfusion. Any allergies you have. All medicines you are taking, including vitamins, herbs, eye drops, creams, and over-the-counter medicines. Any surgeries you have had. Any medical conditions you have, including any recent fever or cold symptoms. Whether you are pregnant or may be pregnant. What are the risks? Generally, this is a safe procedure. However, problems may occur. The most common problems include: A mild allergic reaction, such as red, swollen areas of skin (hives) and itching. Fever or chills. This may be the body's response to new blood cells received. This may occur during or up to 4 hours after the transfusion. More serious problems may include: Transfusion-associated circulatory overload (TACO), or too much fluid in the lungs. This may cause breathing problems. A serious allergic reaction, such as difficulty breathing or swelling around the face and lips. Transfusion-related acute lung injury  (TRALI), which causes breathing difficulty and low oxygen in the blood. This can occur within hours of the transfusion or several days later. Iron overload. This can happen after receiving many blood transfusions over a period of time. Infection or virus being transmitted. This is rare because donated blood is carefully tested before it is given. Hemolytic transfusion reaction. This is rare. It happens when your body's defense system (immune system)tries to attack the new blood cells. Symptoms may include fever, chills, nausea, low blood pressure, and low back or chest pain. Transfusion-associated graft-versus-host disease (TAGVHD). This is rare. It happens when donated cells attack your body's healthy tissues. What happens before the procedure? Medicines Ask your health care provider about: Changing or stopping your regular medicines. This is especially important if you are taking diabetes medicines or blood thinners. Taking medicines such as aspirin and ibuprofen. These medicines can thin your blood. Do not take these medicines unless your health care provider tells you to take them. Taking over-the-counter medicines, vitamins, herbs, and supplements. General instructions Follow instructions from your health care provider about eating and drinking restrictions. You will have a blood test to determine your blood type. This is necessary to know what kind of blood your body will accept and to match it to the donor blood. If you are going to have a planned surgery, you may be able to do an autologous blood donation. This may be done in case you need to have a transfusion. You will have your temperature, blood pressure, and pulse monitored before the transfusion. If you have had an allergic reaction to a transfusion in the past, you may be given medicine to help prevent   a reaction. This medicine may be given to you by mouth (orally) or through an IV. Set aside time for the blood transfusion. This  procedure generally takes 1-4 hours to complete. What happens during the procedure?  An IV will be inserted into one of your veins. The bag of donated blood will be attached to your IV. The blood will then enter through your vein. Your temperature, blood pressure, and pulse will be monitored regularly during the transfusion. This monitoring is done to detect early signs of a transfusion reaction. Tell your nurse right away if you have any of these symptoms during the transfusion: Shortness of breath or trouble breathing. Chest or back pain. Fever or chills. Hives or itching. If you have any signs or symptoms of a reaction, your transfusion will be stopped and you may be given medicine. When the transfusion is complete, your IV will be removed. Pressure may be applied to the IV site for a few minutes. A bandage (dressing)will be applied. The procedure may vary among health care providers and hospitals. What happens after the procedure? Your temperature, blood pressure, pulse, breathing rate, and blood oxygen level will be monitored until you leave the hospital or clinic. Your blood may be tested to see how you are responding to the transfusion. You may be warmed with fluids or blankets to maintain a normal body temperature. If you receive your blood transfusion in an outpatient setting, you will be told whom to contact to report any reactions. Where to find more information For more information on blood transfusions, visit the [employer]: redcross.org Summary A blood transfusion is a procedure in which you receive blood or a type of blood cell (blood component) through an IV. The blood you receive may come from a donor or be donated by yourself (autologous blood donation) before a planned surgery. The blood given in a transfusion is made up of different blood components. You may receive red blood cells, platelets, plasma, or white blood cells depending on the condition treated. Your  temperature, blood pressure, and pulse will be monitored before, during, and after the transfusion. After the transfusion, your blood may be tested to see how your body has responded. This information is not intended to replace advice given to you by your health care provider. Make sure you discuss any questions you have with your health care provider. Document Revised: 03/11/2019 Document Reviewed: 10/29/2018 Elsevier Patient Education  2022 Elsevier Inc.  

## 2020-12-19 LAB — BPAM RBC
Blood Product Expiration Date: 202208202359
ISSUE DATE / TIME: 202208011057
Unit Type and Rh: 6200

## 2020-12-19 LAB — TYPE AND SCREEN
ABO/RH(D): A POS
Antibody Screen: NEGATIVE
Unit division: 0

## 2020-12-25 ENCOUNTER — Other Ambulatory Visit: Payer: Self-pay | Admitting: Hematology and Oncology

## 2020-12-29 ENCOUNTER — Inpatient Hospital Stay: Payer: Medicare Other | Admitting: Hematology and Oncology

## 2020-12-29 ENCOUNTER — Inpatient Hospital Stay: Payer: Medicare Other

## 2020-12-29 ENCOUNTER — Encounter: Payer: Self-pay | Admitting: Hematology and Oncology

## 2020-12-29 ENCOUNTER — Other Ambulatory Visit: Payer: Self-pay

## 2020-12-29 VITALS — BP 167/79 | HR 75 | Temp 97.4°F | Resp 18 | Ht 60.0 in | Wt 114.8 lb

## 2020-12-29 DIAGNOSIS — D539 Nutritional anemia, unspecified: Secondary | ICD-10-CM

## 2020-12-29 DIAGNOSIS — F411 Generalized anxiety disorder: Secondary | ICD-10-CM | POA: Diagnosis not present

## 2020-12-29 DIAGNOSIS — N179 Acute kidney failure, unspecified: Secondary | ICD-10-CM

## 2020-12-29 DIAGNOSIS — C549 Malignant neoplasm of corpus uteri, unspecified: Secondary | ICD-10-CM | POA: Diagnosis not present

## 2020-12-29 DIAGNOSIS — N183 Chronic kidney disease, stage 3 unspecified: Secondary | ICD-10-CM

## 2020-12-29 DIAGNOSIS — Z5111 Encounter for antineoplastic chemotherapy: Secondary | ICD-10-CM | POA: Diagnosis not present

## 2020-12-29 DIAGNOSIS — Z79899 Other long term (current) drug therapy: Secondary | ICD-10-CM | POA: Diagnosis not present

## 2020-12-29 LAB — COMPREHENSIVE METABOLIC PANEL
ALT: 15 U/L (ref 0–44)
AST: 20 U/L (ref 15–41)
Albumin: 3.9 g/dL (ref 3.5–5.0)
Alkaline Phosphatase: 78 U/L (ref 38–126)
Anion gap: 11 (ref 5–15)
BUN: 34 mg/dL — ABNORMAL HIGH (ref 8–23)
CO2: 22 mmol/L (ref 22–32)
Calcium: 9.9 mg/dL (ref 8.9–10.3)
Chloride: 106 mmol/L (ref 98–111)
Creatinine, Ser: 1.26 mg/dL — ABNORMAL HIGH (ref 0.44–1.00)
GFR, Estimated: 45 mL/min — ABNORMAL LOW (ref >60)
Glucose, Bld: 139 mg/dL — ABNORMAL HIGH (ref 70–99)
Potassium: 4.6 mmol/L (ref 3.5–5.1)
Sodium: 139 mmol/L (ref 135–145)
Total Bilirubin: 0.3 mg/dL (ref 0.3–1.2)
Total Protein: 7.7 g/dL (ref 6.5–8.1)

## 2020-12-29 LAB — CBC WITH DIFFERENTIAL/PLATELET
Abs Immature Granulocytes: 0.08 10*3/uL — ABNORMAL HIGH (ref 0.00–0.07)
Basophils Absolute: 0 10*3/uL (ref 0.0–0.1)
Basophils Relative: 0 %
Eosinophils Absolute: 0 10*3/uL (ref 0.0–0.5)
Eosinophils Relative: 0 %
HCT: 30.7 % — ABNORMAL LOW (ref 36.0–46.0)
Hemoglobin: 9.8 g/dL — ABNORMAL LOW (ref 12.0–15.0)
Immature Granulocytes: 2 %
Lymphocytes Relative: 19 %
Lymphs Abs: 1 10*3/uL (ref 0.7–4.0)
MCH: 23.8 pg — ABNORMAL LOW (ref 26.0–34.0)
MCHC: 31.9 g/dL (ref 30.0–36.0)
MCV: 74.5 fL — ABNORMAL LOW (ref 80.0–100.0)
Monocytes Absolute: 0.2 10*3/uL (ref 0.1–1.0)
Monocytes Relative: 3 %
Neutro Abs: 3.9 10*3/uL (ref 1.7–7.7)
Neutrophils Relative %: 76 %
Platelets: 204 10*3/uL (ref 150–400)
RBC: 4.12 MIL/uL (ref 3.87–5.11)
RDW: 18.6 % — ABNORMAL HIGH (ref 11.5–15.5)
WBC: 5.1 10*3/uL (ref 4.0–10.5)
nRBC: 0 % (ref 0.0–0.2)

## 2020-12-29 LAB — SAMPLE TO BLOOD BANK

## 2020-12-29 MED ORDER — DIPHENHYDRAMINE HCL 50 MG/ML IJ SOLN
INTRAMUSCULAR | Status: AC
  Filled 2020-12-29: qty 1

## 2020-12-29 MED ORDER — FAMOTIDINE 20 MG IN NS 100 ML IVPB
20.0000 mg | Freq: Once | INTRAVENOUS | Status: AC
  Administered 2020-12-29: 20 mg via INTRAVENOUS
  Filled 2020-12-29: qty 100

## 2020-12-29 MED ORDER — DIPHENHYDRAMINE HCL 50 MG/ML IJ SOLN
12.5000 mg | Freq: Once | INTRAMUSCULAR | Status: AC
  Administered 2020-12-29: 12.5 mg via INTRAVENOUS

## 2020-12-29 MED ORDER — SODIUM CHLORIDE 0.9% FLUSH
10.0000 mL | INTRAVENOUS | Status: DC | PRN
  Administered 2020-12-29: 10 mL
  Filled 2020-12-29: qty 10

## 2020-12-29 MED ORDER — SODIUM CHLORIDE 0.9 % IV SOLN
Freq: Once | INTRAVENOUS | Status: AC
  Filled 2020-12-29: qty 250

## 2020-12-29 MED ORDER — SODIUM CHLORIDE 0.9 % IV SOLN
250.0000 mg | Freq: Once | INTRAVENOUS | Status: AC
  Administered 2020-12-29: 250 mg via INTRAVENOUS
  Filled 2020-12-29: qty 25

## 2020-12-29 MED ORDER — LORAZEPAM 0.5 MG PO TABS: ORAL_TABLET | ORAL | 0 refills | Status: AC

## 2020-12-29 MED ORDER — SODIUM CHLORIDE 0.9 % IV SOLN
10.0000 mg | Freq: Once | INTRAVENOUS | Status: AC
  Administered 2020-12-29: 10 mg via INTRAVENOUS
  Filled 2020-12-29: qty 10

## 2020-12-29 MED ORDER — SODIUM CHLORIDE 0.9 % IV SOLN
150.0000 mg | Freq: Once | INTRAVENOUS | Status: AC
  Administered 2020-12-29: 150 mg via INTRAVENOUS
  Filled 2020-12-29: qty 150

## 2020-12-29 MED ORDER — SODIUM CHLORIDE 0.9% FLUSH
10.0000 mL | Freq: Once | INTRAVENOUS | Status: AC
  Administered 2020-12-29: 10 mL
  Filled 2020-12-29: qty 10

## 2020-12-29 MED ORDER — SODIUM CHLORIDE 0.9 % IV SOLN
175.0000 mg/m2 | Freq: Once | INTRAVENOUS | Status: AC
  Administered 2020-12-29: 258 mg via INTRAVENOUS
  Filled 2020-12-29: qty 43

## 2020-12-29 MED ORDER — PALONOSETRON HCL INJECTION 0.25 MG/5ML
0.2500 mg | Freq: Once | INTRAVENOUS | Status: AC
  Administered 2020-12-29: 0.25 mg via INTRAVENOUS
  Filled 2020-12-29: qty 5

## 2020-12-29 MED ORDER — LOSARTAN POTASSIUM 25 MG PO TABS: 25.0000 mg | ORAL_TABLET | Freq: Every day | ORAL | 3 refills | Status: DC

## 2020-12-29 MED ORDER — HEPARIN SOD (PORK) LOCK FLUSH 100 UNIT/ML IV SOLN
500.0000 [IU] | Freq: Once | INTRAVENOUS | Status: AC | PRN
  Administered 2020-12-29: 500 [IU]
  Filled 2020-12-29: qty 5

## 2020-12-29 NOTE — Assessment & Plan Note (Signed)
She had fluctuation of her serum creatinine I will adjust the dose of chemotherapy accordingly

## 2020-12-29 NOTE — Progress Notes (Signed)
[facility] OFFICE PROGRESS NOTE  Patient Care Team: Pcp, No as PCP - General [name], RN as Oncology Nurse Navigator (Oncology)  ASSESSMENT & PLAN:  Uterine cancer [facility]) She has excellent response to treatment, albeit with some side effects She will proceed with cycle 6 today and I plan to order CT imaging next month I will touch base with GYN surgeon to determine whether she might be a surgical candidate in the future  CKD (chronic kidney disease), stage III ([place]) She had fluctuation of her serum creatinine I will adjust the dose of chemotherapy accordingly  Anxiety, generalized I refilled her prescription lorazepam I told her this would be her last prescription because of risk of dependency  Deficiency anemia She has multifactorial anemia, combination of thalassemia and recent anemia chronic illness She has significant symptomatic improvement after blood transfusion recently I will bring her back around day 10 after every cycle for blood count recheck and transfusion of 1 unit of blood if hemoglobin is less than 8 She is in agreement  Orders Placed This Encounter  Procedures   CT CHEST ABDOMEN PELVIS W CONTRAST    Standing Status:   Future    Standing Expiration Date:   12/29/2021    Order Specific Question:   Preferred imaging location?    Answer:   [facility]    Order Specific Question:   Radiology Contrast Protocol - do NOT remove file path    Answer:   [email or web address]    All questions were answered. The patient knows to call the clinic with any problems, questions or concerns. The total time spent in the appointment was 30 minutes encounter with patients including review of chart and various tests results, discussions about plan of care and coordination of care plan   [name], MD 12/29/2020 9:59 AM  INTERVAL HISTORY: Please see below for problem oriented charting. She returns for further  follow-up She is doing well Her energy level is fair She denies nausea or constipation I documented blood pressure from home is usually slightly better She has some anxiety on and off and requested lorazepam refill Denies worsening neuropathy  SUMMARY OF ONCOLOGIC HISTORY: Oncology History Overview Note  High grade serous MMR normal Her2 negative   Uterine cancer ([place])  08/28/2020 Imaging   1. Pelvic mass measuring 7.5 x 4.8 cm, contiguous with the sigmoid colon and uterus with loss of fat planes. Site of origin is unclear, may be ovarian, uterine, or colonic. Lack of contrast limits detailed assessment. 2. Above pelvic mass causes partial obstruction of the left ureter with mild left hydroureteronephrosis. 3. Multifocal adenopathy in the abdomen and pelvis, suspicious for metastatic disease, primarily in the mesentery and lower peritoneum and pelvis. Please note that detailed assessment of adenopathy is limited on this noncontrast exam. Recommend oncology referral.  4. Wall thickening of the sigmoid colon distal to the left pelvic mass with pericolonic edema, suspicious for colitis. There is a moderate to large amount of stool in the more proximal colon, suggesting pelvic mass may be causing delayed colonic transit. 5. Small to moderate hiatal hernia.   08/28/2020 - 09/04/2020 Hospital Admission   The patient has been complaining of reduced urine output, loss of appetite and changes in bowel habits She had abnormal imaging study leading to hospitalization, colonoscopy, biopsy and subsequently chemotherapy   08/30/2020 Procedure   CT-guided biopsy of enlarged left external iliac chain lymph node as above   08/30/2020 Pathology Results   SURGICAL  PATHOLOGY  CASE: [id number]  PATIENT: Teresa Oliver  Surgical Pathology Report  Clinical History: Pelvic mass with metastatic lymph adenopathy ([facility])  FINAL MICROSCOPIC DIAGNOSIS:   A. LYMPH NODE, LEFT EXTERNAL ILIAC CHAIN, BIOPSY:  -  Poorly differentiated carcinoma with necrosis.  - See comment.   COMMENT:  The carcinoma is characterized by diffuse sheets of malignant epithelioid cells with prominent nucleoli and frequent mitotic figures. Immunohistochemistry is positive with cytokeratin 7 and PAX 8 with weak  positivity with estrogen receptor and WT1.  P53 shows diffuse strong nuclear positivity.  The carcinoma is negative with cytokeratin 20, CDX2, GATA3, GCDFP, TTF-1, HepPar 1, arginase 1 and CD10.  The  morphology and immunophenotype are most consistent with a gynecologic primary including high-grade serous carcinoma.    08/31/2020 Procedure   Colonoscopy - Preparation of the colon was fair. - Likely malignant partially obstructing tumor in the sigmoid colon (appears extrinsic process). Biopsied. Tattooed. - Internal hemorrhoids. - Stool in the entire examined colon   09/01/2020 Initial Diagnosis   Uterine cancer ([place])   09/01/2020 Procedure   Successful placement of a power injectable Port-A-Cath via the right internal jugular vein. The catheter is ready for immediate use.   09/04/2020 -  Chemotherapy    Patient is on Treatment Plan: UTERINE CARBOPLATIN AUC 6 / PACLITAXEL Q21D       09/07/2020 Cancer Staging   Staging form: Corpus Uteri - Carcinoma and Carcinosarcoma, AJCC 8th Edition - Clinical: FIGO Stage IVA (cT4, cN2a, cM0) - Signed by [name], MD on 09/07/2020   11/15/2020 Imaging   1. Marked interval response to therapy with decrease in size of the mass extending from the uterus to the colon that was seen on the previous examination. Also with decreased size and or resolution of pelvic, mesenteric and retroperitoneal adenopathy seen on previous imaging. 2. Tethering of the colon with area that raise the question of developing colovaginal fistula versus internal enhancement within the mass that is now decreased in size. Attention on follow-up is suggested. Correlate with any current signs of vaginal  drainage. 3. LEFT thoracic inlet adenopathy compatible with involvement in the chest. Area not imaged on previous imaging.     REVIEW OF SYSTEMS:   Constitutional: Denies fevers, chills or abnormal weight loss Eyes: Denies blurriness of vision Ears, nose, mouth, throat, and face: Denies mucositis or sore throat Respiratory: Denies cough, dyspnea or wheezes Cardiovascular: Denies palpitation, chest discomfort or lower extremity swelling Gastrointestinal:  Denies nausea, heartburn or change in bowel habits Skin: Denies abnormal skin rashes Lymphatics: Denies new lymphadenopathy or easy bruising Neurological:Denies numbness, tingling or new weaknesses Behavioral/Psych: Mood is stable, no new changes  All other systems were reviewed with the patient and are negative.  I have reviewed the past medical history, past surgical history, social history and family history with the patient and they are unchanged from previous note.  ALLERGIES:  has No Known Allergies.  MEDICATIONS:  Current Outpatient Medications  Medication Sig Dispense Refill   losartan (COZAAR) 25 MG tablet Take 1 tablet (25 mg total) by mouth daily. 30 tablet 3   amLODipine (NORVASC) 10 MG tablet Take 1 tablet (10 mg total) by mouth daily. 90 tablet 2   atenolol (TENORMIN) 50 MG tablet Take 1 tablet (50 mg total) by mouth daily. 30 tablet 2   cholecalciferol (VITAMIN D3) 25 MCG (1000 UNIT) tablet Take 1,000 Units by mouth daily.     dexamethasone (DECADRON) 4 MG tablet Take 2 tabs at the night  before and 2 tab the morning of chemotherapy, every 3 weeks, by mouth x 6 cycles 36 tablet 6   lidocaine-prilocaine (EMLA) cream Apply 1 application topically daily as needed. 30 g 3   LORazepam (ATIVAN) 0.5 MG tablet TAKE 1 TABLET(0.5 MG) BY MOUTH TWICE DAILY AS NEEDED FOR ANXIETY 60 tablet 0   MAGNESIUM-OXIDE 400 (240 Mg) MG tablet TAKE 1 TABLET(400 MG) BY MOUTH TWICE DAILY 60 tablet 1   ondansetron (ZOFRAN) 8 MG tablet Take 1 tablet  (8 mg total) by mouth every 8 (eight) hours as needed for nausea. 30 tablet 3   oxybutynin (DITROPAN) 5 MG tablet Take 1 tablet (5 mg total) by mouth every 8 (eight) hours as needed for bladder spasms. 90 tablet 1   polyethylene glycol (MIRALAX / GLYCOLAX) 17 g packet Take 17 g by mouth 2 (two) times daily as needed for moderate constipation. 14 each 0   prochlorperazine (COMPAZINE) 10 MG tablet Take 1 tablet (10 mg total) by mouth every 6 (six) hours as needed for nausea or vomiting. 30 tablet 1   senna-docusate (SENOKOT-S) 8.6-50 MG tablet Take 1 tablet by mouth 2 (two) times daily. 30 tablet 1   No current facility-administered medications for this visit.   Facility-Administered Medications Ordered in Other Visits  Medication Dose Route Frequency Provider Last Rate Last Admin   CARBOplatin (PARAPLATIN) 250 mg in sodium chloride 0.9 % 250 mL chemo infusion  250 mg Intravenous Once Alvy Bimler, Gwenn Teodoro, MD       diphenhydrAMINE (BENADRYL) 50 MG/ML injection            heparin lock flush 100 unit/mL  500 Units Intracatheter Once PRN Alvy Bimler, Boleslaus Holloway, MD       PACLitaxel (TAXOL) 258 mg in sodium chloride 0.9 % 250 mL chemo infusion (> $RemoveBef'80mg'pIGWnlPHWN$ /m2)  175 mg/m2 (Treatment Plan Recorded) Intravenous Once Alvy Bimler, Mardy Hoppe, MD       sodium chloride flush (NS) 0.9 % injection 10 mL  10 mL Intracatheter PRN Alvy Bimler, Shykeem Resurreccion, MD        PHYSICAL EXAMINATION: ECOG PERFORMANCE STATUS: 1 - Symptomatic but completely ambulatory  Vitals:   12/29/20 0812  BP: (!) 167/79  Pulse: 75  Resp: 18  Temp: (!) 97.4 F (36.3 C)  SpO2: 100%   Filed Weights   12/29/20 0812  Weight: 114 lb 12.8 oz (52.1 kg)    GENERAL:alert, no distress and comfortable SKIN: skin color, texture, turgor are normal, no rashes or significant lesions EYES: normal, Conjunctiva are pink and non-injected, sclera clear OROPHARYNX:no exudate, no erythema and lips, buccal mucosa, and tongue normal  NECK: supple, thyroid normal size, non-tender, without  nodularity LYMPH:  no palpable lymphadenopathy in the cervical, axillary or inguinal LUNGS: clear to auscultation and percussion with normal breathing effort HEART: regular rate & rhythm and no murmurs and no lower extremity edema ABDOMEN:abdomen soft, non-tender and normal bowel sounds Musculoskeletal:no cyanosis of digits and no clubbing  NEURO: alert & oriented x 3 with fluent speech, no focal motor/sensory deficits  LABORATORY DATA:  I have reviewed the data as listed    Component Value Date/Time   NA 139 12/29/2020 0753   K 4.6 12/29/2020 0753   CL 106 12/29/2020 0753   CO2 22 12/29/2020 0753   GLUCOSE 139 (H) 12/29/2020 0753   BUN 34 (H) 12/29/2020 0753   CREATININE 1.26 (H) 12/29/2020 0753   CREATININE 1.17 (H) 12/08/2020 0735   CALCIUM 9.9 12/29/2020 0753   PROT 7.7 12/29/2020 0753   ALBUMIN  3.9 12/29/2020 0753   AST 20 12/29/2020 0753   AST 27 12/08/2020 0735   ALT 15 12/29/2020 0753   ALT 19 12/08/2020 0735   ALKPHOS 78 12/29/2020 0753   BILITOT 0.3 12/29/2020 0753   BILITOT 0.5 12/08/2020 0735   GFRNONAA 45 (L) 12/29/2020 0753   GFRNONAA 49 (L) 12/08/2020 0735    No results found for: SPEP, UPEP  Lab Results  Component Value Date   WBC 5.1 12/29/2020   NEUTROABS 3.9 12/29/2020   HGB 9.8 (L) 12/29/2020   HCT 30.7 (L) 12/29/2020   MCV 74.5 (L) 12/29/2020   PLT 204 12/29/2020      Chemistry      Component Value Date/Time   NA 139 12/29/2020 0753   K 4.6 12/29/2020 0753   CL 106 12/29/2020 0753   CO2 22 12/29/2020 0753   BUN 34 (H) 12/29/2020 0753   CREATININE 1.26 (H) 12/29/2020 0753   CREATININE 1.17 (H) 12/08/2020 0735      Component Value Date/Time   CALCIUM 9.9 12/29/2020 0753   ALKPHOS 78 12/29/2020 0753   AST 20 12/29/2020 0753   AST 27 12/08/2020 0735   ALT 15 12/29/2020 0753   ALT 19 12/08/2020 0735   BILITOT 0.3 12/29/2020 0753   BILITOT 0.5 12/08/2020 0735

## 2020-12-29 NOTE — Assessment & Plan Note (Signed)
She has excellent response to treatment, albeit with some side effects She will proceed with cycle 6 today and I plan to order CT imaging next month I will touch base with GYN surgeon to determine whether she might be a surgical candidate in the future

## 2020-12-29 NOTE — Assessment & Plan Note (Signed)
She has multifactorial anemia, combination of thalassemia and recent anemia chronic illness She has significant symptomatic improvement after blood transfusion recently I will bring her back around day 10 after every cycle for blood count recheck and transfusion of 1 unit of blood if hemoglobin is less than 8 She is in agreement

## 2020-12-29 NOTE — Assessment & Plan Note (Signed)
I refilled her prescription lorazepam I told her this would be her last prescription because of risk of dependency

## 2021-01-08 ENCOUNTER — Inpatient Hospital Stay: Payer: Medicare Other

## 2021-01-08 ENCOUNTER — Telehealth: Payer: Self-pay | Admitting: Oncology

## 2021-01-08 ENCOUNTER — Other Ambulatory Visit: Payer: Self-pay

## 2021-01-08 DIAGNOSIS — C549 Malignant neoplasm of corpus uteri, unspecified: Secondary | ICD-10-CM

## 2021-01-08 DIAGNOSIS — D539 Nutritional anemia, unspecified: Secondary | ICD-10-CM

## 2021-01-08 DIAGNOSIS — N183 Chronic kidney disease, stage 3 unspecified: Secondary | ICD-10-CM | POA: Diagnosis not present

## 2021-01-08 DIAGNOSIS — Z5111 Encounter for antineoplastic chemotherapy: Secondary | ICD-10-CM | POA: Diagnosis not present

## 2021-01-08 DIAGNOSIS — N179 Acute kidney failure, unspecified: Secondary | ICD-10-CM

## 2021-01-08 DIAGNOSIS — Z79899 Other long term (current) drug therapy: Secondary | ICD-10-CM | POA: Diagnosis not present

## 2021-01-08 LAB — CBC WITH DIFFERENTIAL/PLATELET
Abs Immature Granulocytes: 0 10*3/uL (ref 0.00–0.07)
Basophils Absolute: 0 10*3/uL (ref 0.0–0.1)
Basophils Relative: 1 %
Eosinophils Absolute: 0 10*3/uL (ref 0.0–0.5)
Eosinophils Relative: 1 %
HCT: 28.4 % — ABNORMAL LOW (ref 36.0–46.0)
Hemoglobin: 8.9 g/dL — ABNORMAL LOW (ref 12.0–15.0)
Immature Granulocytes: 0 %
Lymphocytes Relative: 51 %
Lymphs Abs: 1 10*3/uL (ref 0.7–4.0)
MCH: 23.6 pg — ABNORMAL LOW (ref 26.0–34.0)
MCHC: 31.3 g/dL (ref 30.0–36.0)
MCV: 75.3 fL — ABNORMAL LOW (ref 80.0–100.0)
Monocytes Absolute: 0.2 10*3/uL (ref 0.1–1.0)
Monocytes Relative: 10 %
Neutro Abs: 0.7 10*3/uL — ABNORMAL LOW (ref 1.7–7.7)
Neutrophils Relative %: 37 %
Platelets: 156 10*3/uL (ref 150–400)
RBC: 3.77 MIL/uL — ABNORMAL LOW (ref 3.87–5.11)
RDW: 17.8 % — ABNORMAL HIGH (ref 11.5–15.5)
WBC: 2 10*3/uL — ABNORMAL LOW (ref 4.0–10.5)
nRBC: 0 % (ref 0.0–0.2)

## 2021-01-08 LAB — COMPREHENSIVE METABOLIC PANEL
ALT: 13 U/L (ref 0–44)
AST: 19 U/L (ref 15–41)
Albumin: 3.8 g/dL (ref 3.5–5.0)
Alkaline Phosphatase: 68 U/L (ref 38–126)
Anion gap: 10 (ref 5–15)
BUN: 29 mg/dL — ABNORMAL HIGH (ref 8–23)
CO2: 23 mmol/L (ref 22–32)
Calcium: 9.7 mg/dL (ref 8.9–10.3)
Chloride: 107 mmol/L (ref 98–111)
Creatinine, Ser: 1.14 mg/dL — ABNORMAL HIGH (ref 0.44–1.00)
GFR, Estimated: 51 mL/min — ABNORMAL LOW (ref >60)
Glucose, Bld: 87 mg/dL (ref 70–99)
Potassium: 4.5 mmol/L (ref 3.5–5.1)
Sodium: 140 mmol/L (ref 135–145)
Total Bilirubin: 0.3 mg/dL (ref 0.3–1.2)
Total Protein: 7.1 g/dL (ref 6.5–8.1)

## 2021-01-08 LAB — SAMPLE TO BLOOD BANK

## 2021-01-08 NOTE — Telephone Encounter (Signed)
Teresa Oliver with appointment to see Dr. Berline Lopes on 02/19/21 at 1:30.

## 2021-01-08 NOTE — Progress Notes (Unsigned)
Hgb 8.9 . Dr. Alvy Bimler notified. Per Dr. Alvy Bimler, Pt does not need blood transfusion today. Pt. Notified of results and was discharged.

## 2021-01-23 ENCOUNTER — Other Ambulatory Visit: Payer: Self-pay | Admitting: Hematology and Oncology

## 2021-02-16 ENCOUNTER — Inpatient Hospital Stay: Payer: Medicare Other | Attending: Hematology and Oncology

## 2021-02-16 ENCOUNTER — Ambulatory Visit (HOSPITAL_COMMUNITY)
Admission: RE | Admit: 2021-02-16 | Discharge: 2021-02-16 | Disposition: A | Discharge status: Home / Self Care | Payer: Medicare Other | Source: Ambulatory Visit | Attending: Hematology and Oncology | Admitting: Hematology and Oncology

## 2021-02-16 ENCOUNTER — Other Ambulatory Visit: Payer: Self-pay

## 2021-02-16 DIAGNOSIS — N179 Acute kidney failure, unspecified: Secondary | ICD-10-CM

## 2021-02-16 DIAGNOSIS — C549 Malignant neoplasm of corpus uteri, unspecified: Secondary | ICD-10-CM

## 2021-02-16 DIAGNOSIS — Z79899 Other long term (current) drug therapy: Secondary | ICD-10-CM | POA: Insufficient documentation

## 2021-02-16 DIAGNOSIS — I700 Atherosclerosis of aorta: Secondary | ICD-10-CM | POA: Diagnosis not present

## 2021-02-16 DIAGNOSIS — C799 Secondary malignant neoplasm of unspecified site: Secondary | ICD-10-CM | POA: Diagnosis not present

## 2021-02-16 DIAGNOSIS — I2510 Atherosclerotic heart disease of native coronary artery without angina pectoris: Secondary | ICD-10-CM | POA: Diagnosis not present

## 2021-02-16 DIAGNOSIS — I82B12 Acute embolism and thrombosis of left subclavian vein: Secondary | ICD-10-CM | POA: Diagnosis not present

## 2021-02-16 DIAGNOSIS — D539 Nutritional anemia, unspecified: Secondary | ICD-10-CM

## 2021-02-16 LAB — COMPREHENSIVE METABOLIC PANEL
ALT: 14 U/L (ref 0–44)
AST: 22 U/L (ref 15–41)
Albumin: 3.9 g/dL (ref 3.5–5.0)
Alkaline Phosphatase: 124 U/L (ref 38–126)
Anion gap: 12 (ref 5–15)
BUN: 23 mg/dL (ref 8–23)
CO2: 23 mmol/L (ref 22–32)
Calcium: 10.1 mg/dL (ref 8.9–10.3)
Chloride: 102 mmol/L (ref 98–111)
Creatinine, Ser: 1.33 mg/dL — ABNORMAL HIGH (ref 0.44–1.00)
GFR, Estimated: 42 mL/min — ABNORMAL LOW (ref >60)
Glucose, Bld: 104 mg/dL — ABNORMAL HIGH (ref 70–99)
Potassium: 4.5 mmol/L (ref 3.5–5.1)
Sodium: 137 mmol/L (ref 135–145)
Total Bilirubin: 0.3 mg/dL (ref 0.3–1.2)
Total Protein: 7.9 g/dL (ref 6.5–8.1)

## 2021-02-16 LAB — SAMPLE TO BLOOD BANK

## 2021-02-16 LAB — CBC WITH DIFFERENTIAL/PLATELET
Abs Immature Granulocytes: 0.07 10*3/uL (ref 0.00–0.07)
Basophils Absolute: 0 10*3/uL (ref 0.0–0.1)
Basophils Relative: 0 %
Eosinophils Absolute: 0.2 10*3/uL (ref 0.0–0.5)
Eosinophils Relative: 3 %
HCT: 30.3 % — ABNORMAL LOW (ref 36.0–46.0)
Hemoglobin: 9.5 g/dL — ABNORMAL LOW (ref 12.0–15.0)
Immature Granulocytes: 1 %
Lymphocytes Relative: 16 %
Lymphs Abs: 1.1 10*3/uL (ref 0.7–4.0)
MCH: 22.9 pg — ABNORMAL LOW (ref 26.0–34.0)
MCHC: 31.4 g/dL (ref 30.0–36.0)
MCV: 73.2 fL — ABNORMAL LOW (ref 80.0–100.0)
Monocytes Absolute: 0.8 10*3/uL (ref 0.1–1.0)
Monocytes Relative: 11 %
Neutro Abs: 4.9 10*3/uL (ref 1.7–7.7)
Neutrophils Relative %: 69 %
Platelets: 285 10*3/uL (ref 150–400)
RBC: 4.14 MIL/uL (ref 3.87–5.11)
RDW: 16.3 % — ABNORMAL HIGH (ref 11.5–15.5)
WBC: 7.1 10*3/uL (ref 4.0–10.5)
nRBC: 0 % (ref 0.0–0.2)

## 2021-02-16 IMAGING — CT CT CHEST-ABD-PELV W/ CM
2 of 5 series · 13 of 36 positions shown, 15 images · IV contrast (omnipaque)
Comparison: [DATE]

CLINICAL DATA: Uterine cancer.  Restaging.

EXAM:
CT CHEST, ABDOMEN, AND PELVIS WITH CONTRAST
TECHNIQUE: Multidetector CT imaging of the chest, abdomen and pelvis was
performed following the standard protocol during bolus
administration of intravenous contrast.
CONTRAST:  75mL OMNIPAQUE IOHEXOL 350 MG/ML SOLN

[Series 2: cap with · axial · 0.64mm/px · z∈[+1072,+1542]mm · 10 of 116 slices shown, 12 images]
[im 11/116  mediastinal]
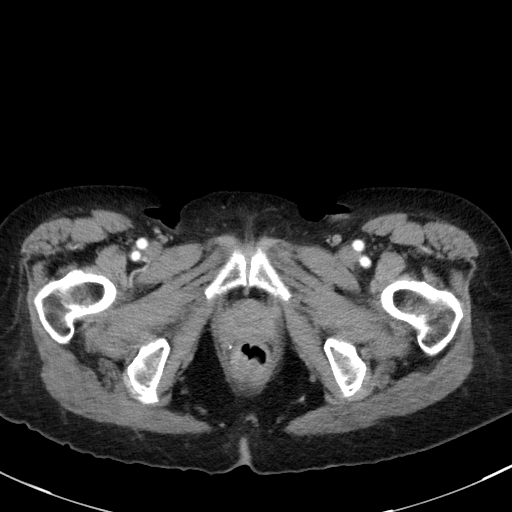
[im 11/116  bone]
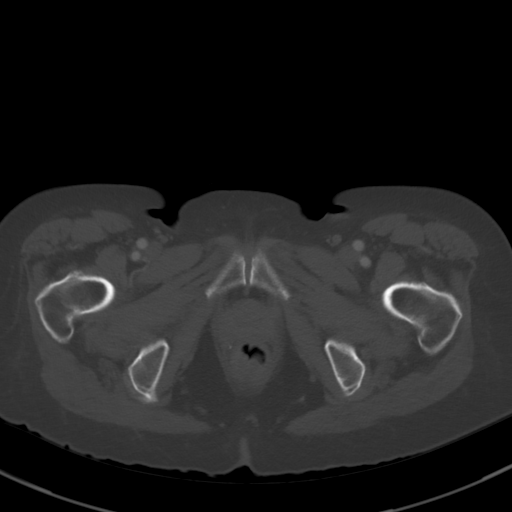
[im 21/116  mediastinal]
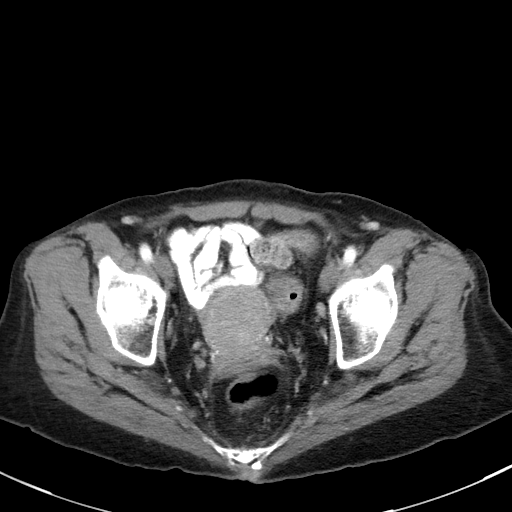
[im 32/116  mediastinal]
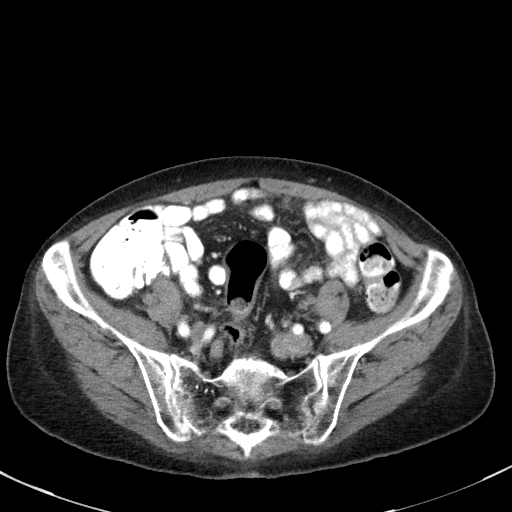
[im 42/116  mediastinal]
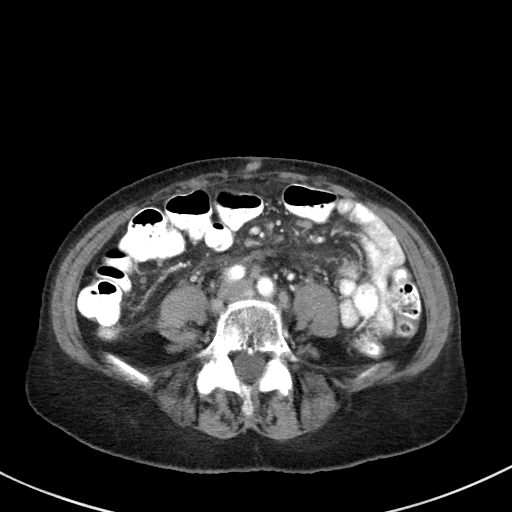
[im 53/116  mediastinal]
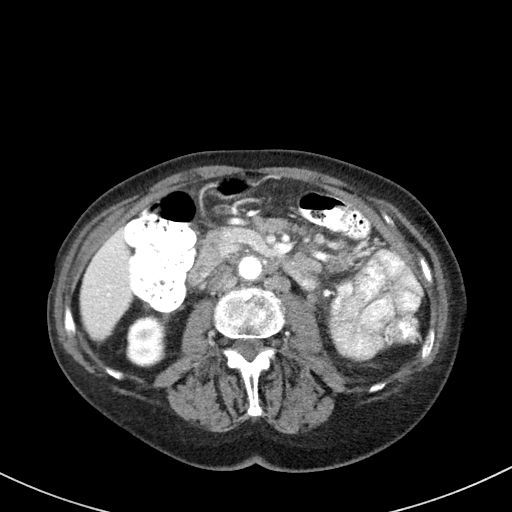
[im 63/116  mediastinal]
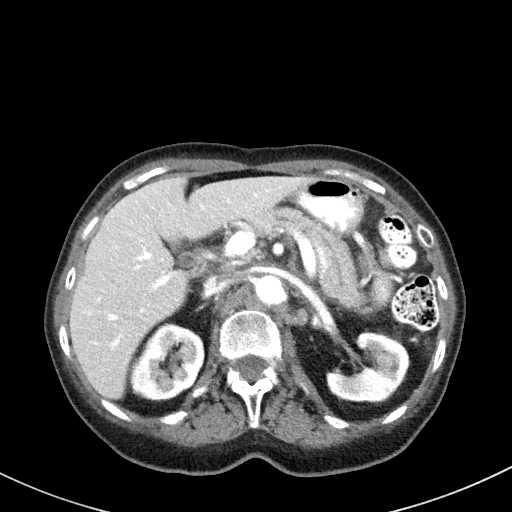
[im 74/116  mediastinal]
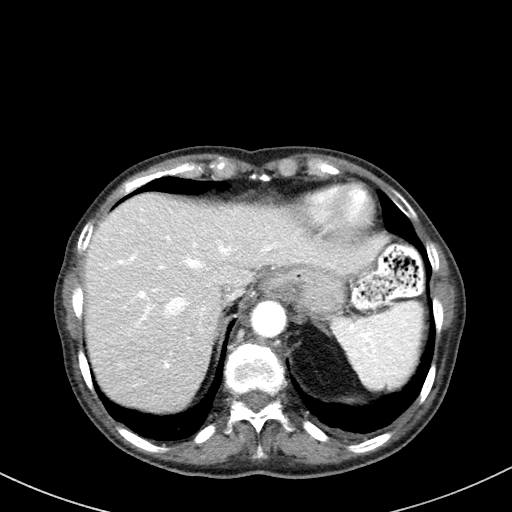
[im 84/116  mediastinal]
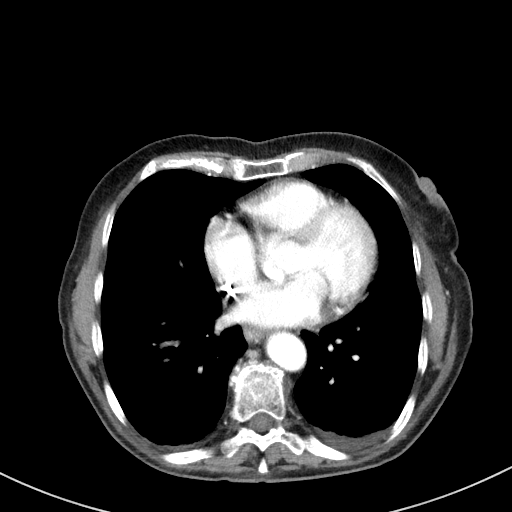
[im 95/116  mediastinal]
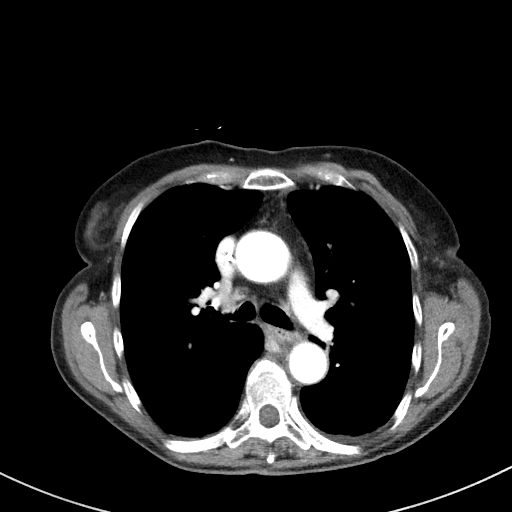
[im 95/116  bone]
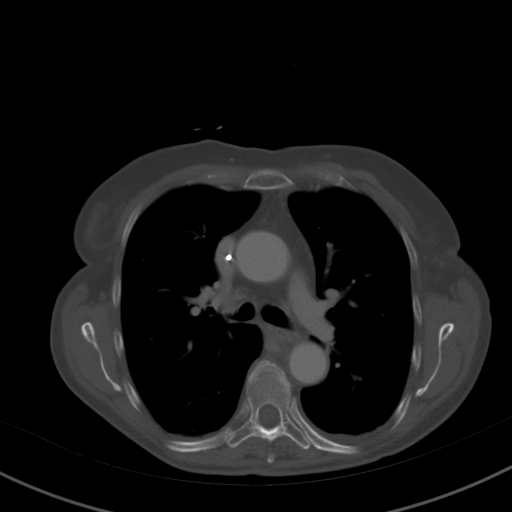
[im 105/116  mediastinal]
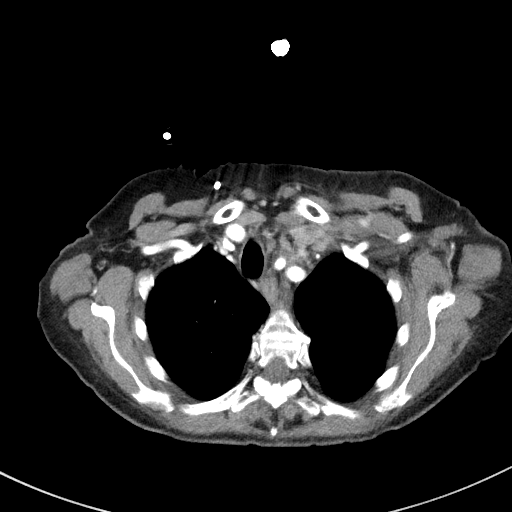

[Series 5: coronals · coronal · 0.82mm/px · 3 of 101 slices shown]
[im 21/101  mediastinal]
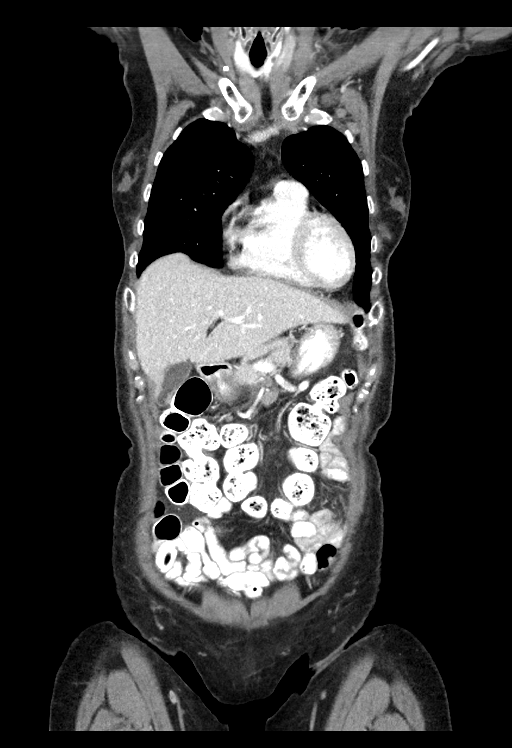
[im 41/101  mediastinal]
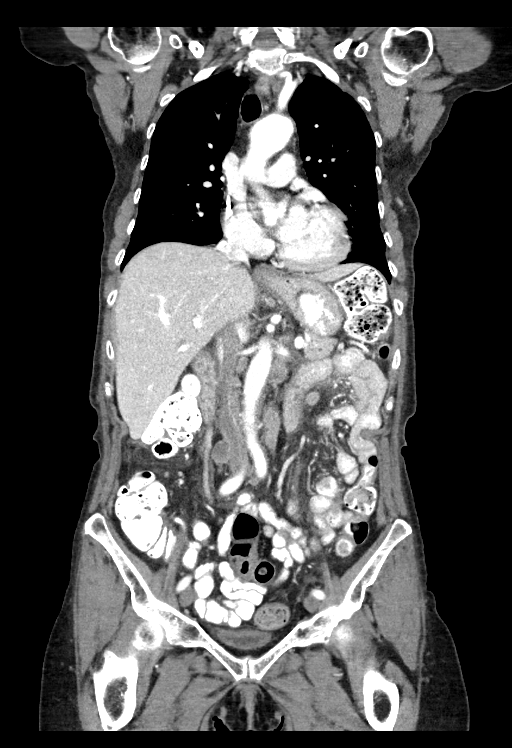
[im 61/101  mediastinal]
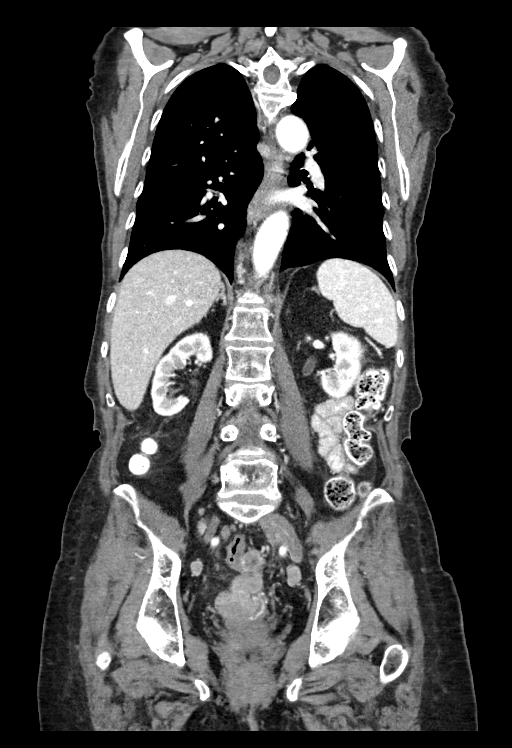

[13 of 36 positions shown; findings below may reference images not displayed]

FINDINGS: CT CHEST FINDINGS

Cardiovascular: The heart size is normal. No substantial pericardial
effusion. Mild atherosclerotic calcification is noted in the wall of
the thoracic aorta. Right Port-A-Cath tip is positioned in the right
atrium.

Mediastinum/Nodes: Left supraclavicular node measured previously at
12 mm short axis is now 22 mm short axis on image 10/series 2.
Interval progression of left subpectoral lymphadenopathy in thrombus
is now visible in the left subclavian/innominate vein. No
mediastinal lymphadenopathy. There is no hilar lymphadenopathy. The
esophagus has normal imaging features.

Lungs/Pleura: No new suspicious pulmonary nodule or mass. No focal
airspace consolidation. No pleural effusion.

Musculoskeletal: No worrisome lytic or sclerotic osseous
abnormality.

CT ABDOMEN PELVIS FINDINGS

Hepatobiliary: No suspicious focal abnormality within the liver
parenchyma. There is no evidence for gallstones, gallbladder wall
thickening, or pericholecystic fluid. No intrahepatic or
extrahepatic biliary dilation.

Pancreas: No focal mass lesion. No dilatation of the main duct. No
intraparenchymal cyst. No peripancreatic edema.

Spleen: No splenomegaly. No focal mass lesion.

Adrenals/Urinary Tract: No adrenal nodule or mass. Kidneys
unremarkable No evidence for hydroureter. The urinary bladder
appears normal for the degree of distention.

Stomach/Bowel: Stomach is unremarkable. No gastric wall thickening.
No evidence of outlet obstruction. Duodenum is normally positioned
as is the ligament of Treitz. No small bowel wall thickening. No
small bowel dilatation. The terminal ileum is normal. The appendix
is normal. No gross colonic mass. No colonic wall thickening.

Vascular/Lymphatic: No abdominal aortic aneurysm. Interval
progression of retroperitoneal lymphadenopathy. 10 mm short axis
left para-aortic node on 58/2 was 4 mm previously (remeasured)
necrotic pericaval node measuring 13 mm today on 71/2 was 4 mm
previously. Relatively bulky lymphadenopathy in the central small
bowel mesentery (image 62/2) is new in the interval.

IVC is opacified between the liver and the iliac vein confluence on
delayed imaging. Patency of the external iliac and common iliac
veins can not be confirmed as these regions were not included on
delayed imaging. Prominence of the left internal and external iliac
vein confluence is stable since prior study.

Lesion between the sigmoid colon and uterus measured previously at
3.1 x 3.1 cm is now 3.5 x 2.6 cm with peripheral rim enhancement.
1.6 x 1.6 cm rim enhancing soft tissue lesion in the central pelvis
on [DATE] measured about 6 mm previously. 12 mm external iliac node on
90/2 was 6 mm short axis previously. Interval progression of right
pelvic sidewall lymph nodes.

Reproductive: Similar appearance of the uterus. No gross adnexal
mass.

Other: No substantial ascites.

Musculoskeletal: No worrisome lytic or sclerotic osseous
abnormality.
IMPRESSION: 1. Interval progression of left supraclavicular,, left thoracic
inlet, left subpectoral, retroperitoneal, mesenteric and pelvic
sidewall lymphadenopathy. Metastatic deposits in the central pelvis
are also progressive.
2. Interval progression of left subpectoral lymphadenopathy with
thrombus now visible in the left subclavian/innominate vein.
3. Aortic Atherosclerosis ([XC]-[XC]).

These results will be called to the ordering clinician or
representative by the Radiologist Assistant, and communication
documented in the PACS or [REDACTED].

## 2021-02-16 MED ORDER — HEPARIN SOD (PORK) LOCK FLUSH 100 UNIT/ML IV SOLN
500.0000 [IU] | Freq: Once | INTRAVENOUS | Status: AC
  Administered 2021-02-16: 500 [IU] via INTRAVENOUS

## 2021-02-16 MED ORDER — IOHEXOL 350 MG/ML SOLN
75.0000 mL | Freq: Once | INTRAVENOUS | Status: AC | PRN
  Administered 2021-02-16: 75 mL via INTRAVENOUS

## 2021-02-16 MED ORDER — SODIUM CHLORIDE 0.9% FLUSH
10.0000 mL | Freq: Once | INTRAVENOUS | Status: AC
  Administered 2021-02-16: 10 mL

## 2021-02-19 ENCOUNTER — Inpatient Hospital Stay: Payer: Medicare Other | Admitting: Gynecologic Oncology

## 2021-02-19 ENCOUNTER — Other Ambulatory Visit: Payer: Self-pay

## 2021-02-19 ENCOUNTER — Inpatient Hospital Stay: Payer: Medicare Other | Attending: Hematology and Oncology | Admitting: Hematology and Oncology

## 2021-02-19 ENCOUNTER — Encounter: Payer: Self-pay | Admitting: Hematology and Oncology

## 2021-02-19 VITALS — BP 150/81 | HR 78 | Temp 97.5°F | Resp 18 | Ht 60.0 in | Wt 112.8 lb

## 2021-02-19 DIAGNOSIS — Z79899 Other long term (current) drug therapy: Secondary | ICD-10-CM | POA: Insufficient documentation

## 2021-02-19 DIAGNOSIS — Z7901 Long term (current) use of anticoagulants: Secondary | ICD-10-CM | POA: Diagnosis not present

## 2021-02-19 DIAGNOSIS — Z79891 Long term (current) use of opiate analgesic: Secondary | ICD-10-CM | POA: Insufficient documentation

## 2021-02-19 DIAGNOSIS — C55 Malignant neoplasm of uterus, part unspecified: Secondary | ICD-10-CM | POA: Insufficient documentation

## 2021-02-19 DIAGNOSIS — I82C12 Acute embolism and thrombosis of left internal jugular vein: Secondary | ICD-10-CM | POA: Diagnosis not present

## 2021-02-19 DIAGNOSIS — N183 Chronic kidney disease, stage 3 unspecified: Secondary | ICD-10-CM | POA: Insufficient documentation

## 2021-02-19 DIAGNOSIS — I82C13 Acute embolism and thrombosis of internal jugular vein, bilateral: Secondary | ICD-10-CM | POA: Insufficient documentation

## 2021-02-19 DIAGNOSIS — Z23 Encounter for immunization: Secondary | ICD-10-CM | POA: Insufficient documentation

## 2021-02-19 DIAGNOSIS — Z5111 Encounter for antineoplastic chemotherapy: Secondary | ICD-10-CM | POA: Diagnosis not present

## 2021-02-19 DIAGNOSIS — C549 Malignant neoplasm of corpus uteri, unspecified: Secondary | ICD-10-CM

## 2021-02-19 DIAGNOSIS — G893 Neoplasm related pain (acute) (chronic): Secondary | ICD-10-CM | POA: Insufficient documentation

## 2021-02-19 DIAGNOSIS — Z7189 Other specified counseling: Secondary | ICD-10-CM

## 2021-02-19 DIAGNOSIS — D649 Anemia, unspecified: Secondary | ICD-10-CM | POA: Diagnosis not present

## 2021-02-19 DIAGNOSIS — I129 Hypertensive chronic kidney disease with stage 1 through stage 4 chronic kidney disease, or unspecified chronic kidney disease: Secondary | ICD-10-CM | POA: Insufficient documentation

## 2021-02-19 DIAGNOSIS — I82C19 Acute embolism and thrombosis of unspecified internal jugular vein: Secondary | ICD-10-CM | POA: Insufficient documentation

## 2021-02-19 MED ORDER — RIVAROXABAN (XARELTO) VTE STARTER PACK (15 & 20 MG): ORAL_TABLET | ORAL | 0 refills | Status: DC

## 2021-02-19 MED ORDER — OXYCODONE HCL 5 MG PO TABS: 5.0000 mg | ORAL_TABLET | ORAL | 0 refills | Status: DC | PRN

## 2021-02-19 NOTE — Assessment & Plan Note (Signed)
She has new onset of back pain I recommend the patient to refrain from taking NSAID due to increased risk of bleeding while on anticoagulation therapy I will start her on low-dose oxycodone I warned her about risk of side effects including nausea and constipation

## 2021-02-19 NOTE — Assessment & Plan Note (Addendum)
We had extensive discussions about goals of care Her prognosis is poor due to disease progression while on chemotherapy I estimated 25% chance of response to combination therapy of pembrolizumab/Lenvima The benefit is likely going to be stabilization of disease and unlikely going to resolve her significant disease burden, and likely will yield a few extra months and not years of longevity We also discussed the role of palliative care referral, advanced directives and living will As mentioned above, the patient is unwilling to commit to a definitive plan of care I will call her tomorrow to follow-up on her decision

## 2021-02-19 NOTE — Progress Notes (Signed)
[place] OFFICE PROGRESS NOTE  Patient Care Team: [facility] [place] as PCP - General (Rheumatology) [name], [name], RN as Oncology Nurse Navigator (Oncology)  ASSESSMENT & PLAN:  Uterine cancer [facility] I have reviewed multiple imaging studies with the patient and her family Unfortunately, she has progressed on recent treatment I show her 3 different imaging studies for comparison After first set of 3 cycles of chemotherapy, she had remarkable response However, since then, she became chemotherapy resistant with rapid growth of her disease She is currently symptomatic with back pain She is very tearful and devastated with the bad news We discussed the risk and benefits of line of chemotherapy with pembrolizumab/Lenvima I am concerned it could exacerbate her pre-existing hypertension and increased risk of bleeding and blood clots She is undecided I will call her tomorrow to check on her for final decision   Acute venous embolism and thrombosis of internal jugular veins ([place]) She has neck discomfort likely secondary to compression of venous system from the supraclavicular lymph node With the presence of the clot, I recommend we start her on anticoagulation therapy The risk and benefits of anticoagulation therapy were discussed and she is in agreement to proceed  Cancer associated pain She has new onset of back pain I recommend the patient to refrain from taking NSAID due to increased risk of bleeding while on anticoagulation therapy I will start her on low-dose oxycodone I warned her about risk of side effects including nausea and constipation  Goals of care, counseling/discussion We had extensive discussions about goals of care Her prognosis is poor due to disease progression while on chemotherapy I estimated 25% chance of response to combination therapy of pembrolizumab/Lenvima The benefit is likely going to be stabilization of disease and unlikely going  to resolve her significant disease burden, and likely will yield a few extra months and not years of longevity We also discussed the role of palliative care referral, advanced directives and living will As mentioned above, the patient is unwilling to commit to a definitive plan of care I will call her tomorrow to follow-up on her decision  No orders of the defined types were placed in this encounter.   All questions were answered. The patient knows to call the clinic with any problems, questions or concerns. The total time spent in the appointment was 40 minutes encounter with patients including review of chart and various tests results, discussions about plan of care and coordination of care plan   [name], MD 02/19/2021 3:45 PM  INTERVAL HISTORY: Please see below for problem oriented charting. she returns for follow-up on results on imaging study She complained of new onset back pain She also have intermittent left-sided neck pain She is very tearful when she found out about abnormal imaging study and wondered whether she should seek second opinion at [facility] Denies nausea but she does have recent mild constipation  REVIEW OF SYSTEMS:   Constitutional: Denies fevers, chills or abnormal weight loss Eyes: Denies blurriness of vision Ears, nose, mouth, throat, and face: Denies mucositis or sore throat Respiratory: Denies cough, dyspnea or wheezes Cardiovascular: Denies palpitation, chest discomfort or lower extremity swelling Gastrointestinal:  Denies nausea, heartburn or change in bowel habits Skin: Denies abnormal skin rashes Lymphatics: Denies new lymphadenopathy or easy bruising Neurological:Denies numbness, tingling or new weaknesses Behavioral/Psych: Mood is stable, no new changes  All other systems were reviewed with the patient and are negative.  I have reviewed the past medical history, past surgical  history, social history and family history with the patient and they are  unchanged from previous note.  ALLERGIES:  has No Known Allergies.  MEDICATIONS:  Current Outpatient Medications  Medication Sig Dispense Refill   oxyCODONE (OXY IR/ROXICODONE) 5 MG immediate release tablet Take 1 tablet (5 mg total) by mouth every 4 (four) hours as needed for severe pain. 30 tablet 0   RIVAROXABAN (XARELTO) VTE STARTER PACK (15 & 20 MG) Follow package directions: Take one 33m tablet by mouth twice a day. On day 22, switch to one 23mtablet once a day. Take with food. 51 each 0   amLODipine (NORVASC) 10 MG tablet Take 1 tablet (10 mg total) by mouth daily. 90 tablet 2   atenolol (TENORMIN) 50 MG tablet TAKE 1 TABLET(50 MG) BY MOUTH DAILY 30 tablet 2   cholecalciferol (VITAMIN D3) 25 MCG (1000 UNIT) tablet Take 1,000 Units by mouth daily.     lidocaine-prilocaine (EMLA) cream Apply 1 application topically daily as needed. 30 g 3   LORazepam (ATIVAN) 0.5 MG tablet TAKE 1 TABLET(0.5 MG) BY MOUTH TWICE DAILY AS NEEDED FOR ANXIETY 60 tablet 0   losartan (COZAAR) 25 MG tablet Take 1 tablet (25 mg total) by mouth daily. 30 tablet 3   MAGNESIUM-OXIDE 400 (240 Mg) MG tablet TAKE 1 TABLET(400 MG) BY MOUTH TWICE DAILY 60 tablet 1   ondansetron (ZOFRAN) 8 MG tablet Take 1 tablet (8 mg total) by mouth every 8 (eight) hours as needed for nausea. 30 tablet 3   oxybutynin (DITROPAN) 5 MG tablet Take 1 tablet (5 mg total) by mouth every 8 (eight) hours as needed for bladder spasms. 90 tablet 1   polyethylene glycol (MIRALAX / GLYCOLAX) 17 g packet Take 17 g by mouth 2 (two) times daily as needed for moderate constipation. 14 each 0   prochlorperazine (COMPAZINE) 10 MG tablet Take 1 tablet (10 mg total) by mouth every 6 (six) hours as needed for nausea or vomiting. 30 tablet 1   senna-docusate (SENOKOT-S) 8.6-50 MG tablet Take 1 tablet by mouth 2 (two) times daily. 30 tablet 1   No current facility-administered medications for this visit.    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 Pelvic mass measuring 7.5 x 4.8 cm, contiguous with the sigmoid colon and uterus with loss of fat planes. Site of origin is unclear, may be ovarian, uterine, or colonic. Lack of contrast limits detailed assessment. 2. Above pelvic mass causes partial obstruction of the left ureter with mild left hydroureteronephrosis. 3. Multifocal adenopathy in the abdomen and pelvis, suspicious for metastatic disease, primarily in the mesentery and lower peritoneum and pelvis. Please note that detailed assessment of adenopathy is limited on this noncontrast exam. Recommend oncology referral.  4. Wall thickening of the sigmoid colon distal to the left pelvic mass with pericolonic edema, suspicious for colitis. There is a moderate to large amount of stool in the more proximal colon, suggesting pelvic mass may be causing delayed colonic transit. 5. Small to moderate hiatal hernia.   08/28/2020 - 09/04/2020 Hospital Admission   The patient has been complaining of reduced urine output, loss of appetite and changes in bowel habits She had abnormal imaging study leading to hospitalization, colonoscopy, biopsy and subsequently chemotherapy   08/30/2020 Procedure   CT-guided biopsy of enlarged left external iliac chain lymph node as above   08/30/2020 Pathology Results  SURGICAL PATHOLOGY  CASE: [id number]  PATIENT: Kadyn Dutan  Surgical Pathology Report  Clinical History: Pelvic mass with metastatic lymph adenopathy ([place])  FINAL MICROSCOPIC DIAGNOSIS:   A. LYMPH NODE, LEFT EXTERNAL ILIAC CHAIN, BIOPSY:  - Poorly differentiated carcinoma with necrosis.  - See comment.   COMMENT:  The carcinoma is characterized by diffuse sheets of malignant epithelioid cells with prominent nucleoli and frequent mitotic figures. Immunohistochemistry is positive with cytokeratin 7 and PAX 8 with weak  positivity with  estrogen receptor and WT1.  P53 shows diffuse strong nuclear positivity.  The carcinoma is negative with cytokeratin 20, CDX2, GATA3, GCDFP, TTF-1, HepPar 1, arginase 1 and CD10.  The  morphology and immunophenotype are most consistent with a gynecologic primary including high-grade serous carcinoma.    08/31/2020 Procedure   Colonoscopy - Preparation of the colon was fair. - Likely malignant partially obstructing tumor in the sigmoid colon (appears extrinsic process). Biopsied. Tattooed. - Internal hemorrhoids. - Stool in the entire examined colon   09/01/2020 Initial Diagnosis   Uterine cancer ([place])   09/01/2020 Procedure   Successful placement of a power injectable Port-A-Cath via the right internal jugular vein. The catheter is ready for immediate use.   09/04/2020 - 12/29/2020 Chemotherapy    Patient is on Treatment Plan: UTERINE CARBOPLATIN AUC 6 / PACLITAXEL Q21D       09/07/2020 Cancer Staging   Staging form: Corpus Uteri - Carcinoma and Carcinosarcoma, AJCC 8th Edition - Clinical: FIGO Stage IVA (cT4, cN2a, cM0) - Signed by [name], MD on 09/07/2020   11/15/2020 Imaging   1. Marked interval response to therapy with decrease in size of the mass extending from the uterus to the colon that was seen on the previous examination. Also with decreased size and or resolution of pelvic, mesenteric and retroperitoneal adenopathy seen on previous imaging. 2. Tethering of the colon with area that raise the question of developing colovaginal fistula versus internal enhancement within the mass that is now decreased in size. Attention on follow-up is suggested. Correlate with any current signs of vaginal drainage. 3. LEFT thoracic inlet adenopathy compatible with involvement in the chest. Area not imaged on previous imaging.   02/18/2021 Imaging   1. Interval progression of left supraclavicular,, left thoracic inlet, left subpectoral, retroperitoneal, mesenteric and pelvic sidewall  lymphadenopathy. Metastatic deposits in the central pelvis are also progressive. 2. Interval progression of left subpectoral lymphadenopathy with thrombus now visible in the left subclavian/innominate vein. 3. Aortic Atherosclerosis (ICD10-I70.0).     PHYSICAL EXAMINATION: ECOG PERFORMANCE STATUS: 1 - Symptomatic but completely ambulatory  Vitals:   02/19/21 1222  BP: (!) 150/81  Pulse: 78  Resp: 18  Temp: (!) 97.5 F (36.4 C)  SpO2: 100%   Filed Weights   02/19/21 1222  Weight: 112 lb 12.8 oz (51.2 kg)    GENERAL:alert, no distress and comfortable NEURO: alert & oriented x 3 with fluent speech, no focal motor/sensory deficits  LABORATORY DATA:  I have reviewed the data as listed    Component Value Date/Time   NA 137 02/16/2021 0910   K 4.5 02/16/2021 0910   CL 102 02/16/2021 0910   CO2 23 02/16/2021 0910   GLUCOSE 104 (H) 02/16/2021 0910   BUN 23 02/16/2021 0910   CREATININE 1.33 (H) 02/16/2021 0910   CREATININE 1.17 (H) 12/08/2020 0735   CALCIUM 10.1 02/16/2021 0910   PROT 7.9 02/16/2021 0910   ALBUMIN 3.9 02/16/2021 0910   AST 22 02/16/2021 0910   AST 27  12/08/2020 0735   ALT 14 02/16/2021 0910   ALT 19 12/08/2020 0735   ALKPHOS 124 02/16/2021 0910   BILITOT 0.3 02/16/2021 0910   BILITOT 0.5 12/08/2020 0735   GFRNONAA 42 (L) 02/16/2021 0910   GFRNONAA 49 (L) 12/08/2020 0735    No results found for: SPEP, UPEP  Lab Results  Component Value Date   WBC 7.1 02/16/2021   NEUTROABS 4.9 02/16/2021   HGB 9.5 (L) 02/16/2021   HCT 30.3 (L) 02/16/2021   MCV 73.2 (L) 02/16/2021   PLT 285 02/16/2021      Chemistry      Component Value Date/Time   NA 137 02/16/2021 0910   K 4.5 02/16/2021 0910   CL 102 02/16/2021 0910   CO2 23 02/16/2021 0910   BUN 23 02/16/2021 0910   CREATININE 1.33 (H) 02/16/2021 0910   CREATININE 1.17 (H) 12/08/2020 0735      Component Value Date/Time   CALCIUM 10.1 02/16/2021 0910   ALKPHOS 124 02/16/2021 0910   AST 22  02/16/2021 0910   AST 27 12/08/2020 0735   ALT 14 02/16/2021 0910   ALT 19 12/08/2020 0735   BILITOT 0.3 02/16/2021 0910   BILITOT 0.5 12/08/2020 0735       RADIOGRAPHIC STUDIES: I have reviewed multiple CT imaging with the patient and family I have personally reviewed the radiological images as listed and agreed with the findings in the report. CT CHEST ABDOMEN PELVIS W CONTRAST  Result Date: 02/18/2021 CLINICAL DATA:  Uterine cancer.  Restaging. EXAM: CT CHEST, ABDOMEN, AND PELVIS WITH CONTRAST TECHNIQUE: Multidetector CT imaging of the chest, abdomen and pelvis was performed following the standard protocol during bolus administration of intravenous contrast. CONTRAST:  66m OMNIPAQUE IOHEXOL 350 MG/ML SOLN COMPARISON:  11/15/2020 FINDINGS: CT CHEST FINDINGS Cardiovascular: The heart size is normal. No substantial pericardial effusion. Mild atherosclerotic calcification is noted in the wall of the thoracic aorta. Right Port-A-Cath tip is positioned in the right atrium. Mediastinum/Nodes: Left supraclavicular node measured previously at 12 mm short axis is now 22 mm short axis on image 10/series 2. Interval progression of left subpectoral lymphadenopathy in thrombus is now visible in the left subclavian/innominate vein. No mediastinal lymphadenopathy. There is no hilar lymphadenopathy. The esophagus has normal imaging features. Lungs/Pleura: No new suspicious pulmonary nodule or mass. No focal airspace consolidation. No pleural effusion. Musculoskeletal: No worrisome lytic or sclerotic osseous abnormality. CT ABDOMEN PELVIS FINDINGS Hepatobiliary: No suspicious focal abnormality within the liver parenchyma. There is no evidence for gallstones, gallbladder wall thickening, or pericholecystic fluid. No intrahepatic or extrahepatic biliary dilation. Pancreas: No focal mass lesion. No dilatation of the main duct. No intraparenchymal cyst. No peripancreatic edema. Spleen: No splenomegaly. No focal mass  lesion. Adrenals/Urinary Tract: No adrenal nodule or mass. Kidneys unremarkable No evidence for hydroureter. The urinary bladder appears normal for the degree of distention. Stomach/Bowel: Stomach is unremarkable. No gastric wall thickening. No evidence of outlet obstruction. Duodenum is normally positioned as is the ligament of Treitz. No small bowel wall thickening. No small bowel dilatation. The terminal ileum is normal. The appendix is normal. No gross colonic mass. No colonic wall thickening. Vascular/Lymphatic: No abdominal aortic aneurysm. Interval progression of retroperitoneal lymphadenopathy. 10 mm short axis left para-aortic node on 58/2 was 4 mm previously (remeasured) necrotic pericaval node measuring 13 mm today on 71/2 was 4 mm previously. Relatively bulky lymphadenopathy in the central small bowel mesentery (image 62/2) is new in the interval. IVC is opacified between the liver  and the iliac vein confluence on delayed imaging. Patency of the external iliac and common iliac veins can not be confirmed as these regions were not included on delayed imaging. Prominence of the left internal and external iliac vein confluence is stable since prior study. Lesion between the sigmoid colon and uterus measured previously at 3.1 x 3.1 cm is now 3.5 x 2.6 cm with peripheral rim enhancement. 1.6 x 1.6 cm rim enhancing soft tissue lesion in the central pelvis on 89/2 measured about 6 mm previously. 12 mm external iliac node on 90/2 was 6 mm short axis previously. Interval progression of right pelvic sidewall lymph nodes. Reproductive: Similar appearance of the uterus. No gross adnexal mass. Other: No substantial ascites. Musculoskeletal: No worrisome lytic or sclerotic osseous abnormality. IMPRESSION: 1. Interval progression of left supraclavicular,, left thoracic inlet, left subpectoral, retroperitoneal, mesenteric and pelvic sidewall lymphadenopathy. Metastatic deposits in the central pelvis are also progressive.  2. Interval progression of left subpectoral lymphadenopathy with thrombus now visible in the left subclavian/innominate vein. 3. Aortic Atherosclerosis (ICD10-I70.0). These results will be called to the ordering clinician or representative by the Radiologist Assistant, and communication documented in the PACS or [employer]. Electronically Signed   By: Misty Stanley M.D.   On: 02/18/2021 08:20

## 2021-02-19 NOTE — Assessment & Plan Note (Signed)
I have reviewed multiple imaging studies with the patient and her family Unfortunately, she has progressed on recent treatment I show her 3 different imaging studies for comparison After first set of 3 cycles of chemotherapy, she had remarkable response However, since then, she became chemotherapy resistant with rapid growth of her disease She is currently symptomatic with back pain She is very tearful and devastated with the bad news We discussed the risk and benefits of line of chemotherapy with pembrolizumab/Lenvima I am concerned it could exacerbate her pre-existing hypertension and increased risk of bleeding and blood clots She is undecided I will call her tomorrow to check on her for final decision

## 2021-02-19 NOTE — Assessment & Plan Note (Signed)
She has neck discomfort likely secondary to compression of venous system from the supraclavicular lymph node With the presence of the clot, I recommend we start her on anticoagulation therapy The risk and benefits of anticoagulation therapy were discussed and she is in agreement to proceed

## 2021-02-20 ENCOUNTER — Telehealth: Payer: Self-pay

## 2021-02-20 ENCOUNTER — Other Ambulatory Visit: Payer: Self-pay

## 2021-02-20 ENCOUNTER — Other Ambulatory Visit: Payer: Self-pay | Admitting: Hematology and Oncology

## 2021-02-20 ENCOUNTER — Other Ambulatory Visit (HOSPITAL_COMMUNITY): Payer: Self-pay

## 2021-02-20 ENCOUNTER — Encounter: Payer: Self-pay | Admitting: Hematology and Oncology

## 2021-02-20 DIAGNOSIS — C549 Malignant neoplasm of corpus uteri, unspecified: Secondary | ICD-10-CM

## 2021-02-20 MED ORDER — LENVATINIB (20 MG DAILY DOSE) 2 X 10 MG PO CPPK
10.0000 mg | ORAL_CAPSULE | Freq: Every day | ORAL | 11 refills | Status: DC
  Filled 2021-02-20: qty 30, 30d supply, fill #0

## 2021-02-20 MED ORDER — RIVAROXABAN (XARELTO) VTE STARTER PACK (15 & 20 MG)
ORAL_TABLET | ORAL | 0 refills | Status: DC
  Filled 2021-02-20: qty 51, 30d supply, fill #0

## 2021-02-20 NOTE — Telephone Encounter (Signed)
Oral Oncology Pharmacist Encounter  Received new prescription for lenvatinib Michel [place]) for the treatment of recurrent uterine cancer in conjunction with pembrolizumab, planned duration until disease progression or unacceptable toxicity.  Patient has pre-existing hypertension. Due to the side effects of lenvima, patient will start on the lower dose of 10mg . Prescription dose and frequency assessed.   Labs from 02/16/2021- CBC and CMP (including LFTs) assessed, patients creatinine clearance based on Scr of 1.33 is 29.99- patient is already dose reduced to the 10mg . No further interventions needed.   Patient currently has standing orders for TSH levels and will monitor blood pressure.   Current medication list in Epic reviewed, DDIs with lenvima identified: none  Evaluated chart and no patient barriers to medication adherence noted.   Patient agreement for treatment documented in MD note on 02/19/2021. Patient and MD spoke on 02/20/2021 agreeing to plan to continue with this treatment.   Prescription has been e-scribed to the [facility] for benefits analysis and approval.  Oral Oncology Clinic will continue to follow for insurance authorization, copayment issues, initial counseling and start date.  Drema Halon, PharmD Hematology/Oncology Clinical Pharmacist [facility] [phone] 02/20/2021 10:22 AM

## 2021-02-20 NOTE — Progress Notes (Signed)
DISCONTINUE ON PATHWAY REGIMEN - Uterine     A cycle is every 21 days:     Paclitaxel      Carboplatin   **Always confirm dose/schedule in your pharmacy ordering system**  REASON: Disease Progression PRIOR TREATMENT: [id number]: Carboplatin AUC=6 + Paclitaxel 175 mg/m2 q21 Days x 6 Cycles Followed by Referral to Radiation TREATMENT RESPONSE: Progressive Disease (PD)  START ON PATHWAY REGIMEN - Uterine     A cycle is every 21 days:     Lenvatinib      Pembrolizumab   **Always confirm dose/schedule in your pharmacy ordering system**  Patient Characteristics: Serous Carcinoma, Recurrent/Progressive Disease, Second Line, Relapse < 12 Months From Prior Therapy Histology: Serous Carcinoma Therapeutic Status: Recurrent or Progressive Disease Line of Therapy: Second Line Time to Recurrence: Relapse < 12 Months From Prior Therapy Intent of Therapy: Non-Curative / Palliative Intent, Discussed with Patient

## 2021-02-20 NOTE — Telephone Encounter (Signed)
Oral Oncology Patient Advocate Encounter  Prior Authorization for Teresa Oliver has been approved.    PA# [id number] Effective dates: 02/20/21 through 05/19/22  Patients co-pay is $3287.46  Oral Oncology Clinic will continue to follow.   [place] Patient [place] Phone [phone] Fax [phone] 02/20/2021 10:38 AM

## 2021-02-20 NOTE — Telephone Encounter (Signed)
Oral Oncology Patient Advocate Encounter   Received notification from [employer] that prior authorization for Lenvima is required.   PA submitted on CoverMyMeds Key [id number] Status is pending   Oral Oncology Clinic will continue to follow.  [place] Patient [place] Phone [phone] Fax [phone] 02/20/2021 10:22 AM

## 2021-02-20 NOTE — Telephone Encounter (Signed)
I have created treatment plan

## 2021-02-20 NOTE — Telephone Encounter (Signed)
Called to follow up on yesterdays office visit to see how she is doing today. She was able to get the oxycodone Rx yesterday at the pharmacy. She took 1 last night but is really not having a lot of pain. Xarelto was not available at [facility]. Sent to [place] and she will start today.  She is still thinking about getting a second opinion and will call back when she decides.  She would like to get Keytruda and Lenvima as soon as possible. She appreciated the call.

## 2021-02-20 NOTE — Telephone Encounter (Signed)
Called and given appt with Dr. Alvy Bimler for 10/11 at 1 pm. Told scheduling will call with infusion appt. She verbalized understanding.

## 2021-02-22 ENCOUNTER — Other Ambulatory Visit (HOSPITAL_COMMUNITY): Payer: Self-pay

## 2021-02-23 ENCOUNTER — Other Ambulatory Visit: Payer: Self-pay

## 2021-02-23 ENCOUNTER — Inpatient Hospital Stay: Payer: Medicare Other | Admitting: Hematology and Oncology

## 2021-02-23 ENCOUNTER — Other Ambulatory Visit (HOSPITAL_COMMUNITY): Payer: Self-pay

## 2021-02-23 ENCOUNTER — Telehealth: Payer: Self-pay

## 2021-02-23 ENCOUNTER — Encounter: Payer: Self-pay | Admitting: Hematology and Oncology

## 2021-02-23 ENCOUNTER — Inpatient Hospital Stay: Payer: Medicare Other

## 2021-02-23 ENCOUNTER — Other Ambulatory Visit: Payer: Self-pay | Admitting: Hematology and Oncology

## 2021-02-23 VITALS — Resp 16

## 2021-02-23 DIAGNOSIS — Z5111 Encounter for antineoplastic chemotherapy: Secondary | ICD-10-CM | POA: Diagnosis not present

## 2021-02-23 DIAGNOSIS — D539 Nutritional anemia, unspecified: Secondary | ICD-10-CM

## 2021-02-23 DIAGNOSIS — C549 Malignant neoplasm of corpus uteri, unspecified: Secondary | ICD-10-CM

## 2021-02-23 DIAGNOSIS — Z79891 Long term (current) use of opiate analgesic: Secondary | ICD-10-CM | POA: Diagnosis not present

## 2021-02-23 DIAGNOSIS — G893 Neoplasm related pain (acute) (chronic): Secondary | ICD-10-CM | POA: Diagnosis not present

## 2021-02-23 DIAGNOSIS — D649 Anemia, unspecified: Secondary | ICD-10-CM | POA: Diagnosis not present

## 2021-02-23 DIAGNOSIS — I10 Essential (primary) hypertension: Secondary | ICD-10-CM

## 2021-02-23 DIAGNOSIS — I129 Hypertensive chronic kidney disease with stage 1 through stage 4 chronic kidney disease, or unspecified chronic kidney disease: Secondary | ICD-10-CM | POA: Diagnosis not present

## 2021-02-23 DIAGNOSIS — N183 Chronic kidney disease, stage 3 unspecified: Secondary | ICD-10-CM | POA: Diagnosis not present

## 2021-02-23 DIAGNOSIS — N179 Acute kidney failure, unspecified: Secondary | ICD-10-CM

## 2021-02-23 DIAGNOSIS — I82C13 Acute embolism and thrombosis of internal jugular vein, bilateral: Secondary | ICD-10-CM | POA: Diagnosis not present

## 2021-02-23 DIAGNOSIS — Z7901 Long term (current) use of anticoagulants: Secondary | ICD-10-CM | POA: Diagnosis not present

## 2021-02-23 DIAGNOSIS — Z79899 Other long term (current) drug therapy: Secondary | ICD-10-CM | POA: Diagnosis not present

## 2021-02-23 DIAGNOSIS — Z23 Encounter for immunization: Secondary | ICD-10-CM | POA: Diagnosis not present

## 2021-02-23 LAB — SAMPLE TO BLOOD BANK

## 2021-02-23 LAB — CBC WITH DIFFERENTIAL (CANCER CENTER ONLY)
Abs Immature Granulocytes: 0.14 10*3/uL — ABNORMAL HIGH (ref 0.00–0.07)
Basophils Absolute: 0 10*3/uL (ref 0.0–0.1)
Basophils Relative: 1 %
Eosinophils Absolute: 0.2 10*3/uL (ref 0.0–0.5)
Eosinophils Relative: 2 %
HCT: 29 % — ABNORMAL LOW (ref 36.0–46.0)
Hemoglobin: 9.1 g/dL — ABNORMAL LOW (ref 12.0–15.0)
Immature Granulocytes: 2 %
Lymphocytes Relative: 12 %
Lymphs Abs: 1.1 10*3/uL (ref 0.7–4.0)
MCH: 22.8 pg — ABNORMAL LOW (ref 26.0–34.0)
MCHC: 31.4 g/dL (ref 30.0–36.0)
MCV: 72.7 fL — ABNORMAL LOW (ref 80.0–100.0)
Monocytes Absolute: 0.8 10*3/uL (ref 0.1–1.0)
Monocytes Relative: 10 %
Neutro Abs: 6.5 10*3/uL (ref 1.7–7.7)
Neutrophils Relative %: 73 %
Platelet Count: 283 10*3/uL (ref 150–400)
RBC: 3.99 MIL/uL (ref 3.87–5.11)
RDW: 15.6 % — ABNORMAL HIGH (ref 11.5–15.5)
WBC Count: 8.7 10*3/uL (ref 4.0–10.5)
nRBC: 0 % (ref 0.0–0.2)

## 2021-02-23 LAB — CMP (CANCER CENTER ONLY)
ALT: 15 U/L (ref 0–44)
AST: 16 U/L (ref 15–41)
Albumin: 3.5 g/dL (ref 3.5–5.0)
Alkaline Phosphatase: 133 U/L — ABNORMAL HIGH (ref 38–126)
Anion gap: 12 (ref 5–15)
BUN: 33 mg/dL — ABNORMAL HIGH (ref 8–23)
CO2: 23 mmol/L (ref 22–32)
Calcium: 9.8 mg/dL (ref 8.9–10.3)
Chloride: 98 mmol/L (ref 98–111)
Creatinine: 1.54 mg/dL — ABNORMAL HIGH (ref 0.44–1.00)
GFR, Estimated: 35 mL/min — ABNORMAL LOW (ref >60)
Glucose, Bld: 123 mg/dL — ABNORMAL HIGH (ref 70–99)
Potassium: 4.5 mmol/L (ref 3.5–5.1)
Sodium: 133 mmol/L — ABNORMAL LOW (ref 135–145)
Total Bilirubin: 0.4 mg/dL (ref 0.3–1.2)
Total Protein: 7.7 g/dL (ref 6.5–8.1)

## 2021-02-23 LAB — TOTAL PROTEIN, URINE DIPSTICK: Protein, ur: 100 mg/dL — AB

## 2021-02-23 LAB — TSH: TSH: 3.096 u[IU]/mL (ref 0.308–3.960)

## 2021-02-23 MED ORDER — SODIUM CHLORIDE 0.9% FLUSH
10.0000 mL | Freq: Once | INTRAVENOUS | Status: AC
  Administered 2021-02-23: 10 mL

## 2021-02-23 MED ORDER — LOSARTAN POTASSIUM 50 MG PO TABS: 50.0000 mg | ORAL_TABLET | Freq: Every day | ORAL | 3 refills | Status: DC

## 2021-02-23 MED ORDER — SODIUM CHLORIDE 0.9% FLUSH
10.0000 mL | INTRAVENOUS | Status: DC | PRN
  Administered 2021-02-23: 10 mL

## 2021-02-23 MED ORDER — SODIUM CHLORIDE 0.9 % IV SOLN: Freq: Once | INTRAVENOUS | Status: AC

## 2021-02-23 MED ORDER — SODIUM CHLORIDE 0.9 % IV SOLN
200.0000 mg | Freq: Once | INTRAVENOUS | Status: AC
  Administered 2021-02-23: 200 mg via INTRAVENOUS
  Filled 2021-02-23: qty 8

## 2021-02-23 MED ORDER — HEPARIN SOD (PORK) LOCK FLUSH 100 UNIT/ML IV SOLN
500.0000 [IU] | Freq: Once | INTRAVENOUS | Status: AC | PRN
  Administered 2021-02-23: 500 [IU]

## 2021-02-23 NOTE — Telephone Encounter (Signed)
Patient is approved for Lenvima at no cost from [facility] 02/22/21-05/19/21  [place] uses [place] Patient [place] Phone [phone] Fax [phone] 02/23/2021 8:12 AM

## 2021-02-23 NOTE — Assessment & Plan Note (Signed)
She had fluctuation of her serum creatinine We will continue aggressive risk factor modification

## 2021-02-23 NOTE — Assessment & Plan Note (Signed)
I recommend the patient to refrain from taking NSAID due to increased risk of bleeding while on anticoagulation therapy She is instructed to take oxycodone, acetaminophen or combination as needed I warned her about risk of side effects including nausea and constipation

## 2021-02-23 NOTE — Assessment & Plan Note (Signed)
She has poorly controlled hypertension I plan to increase losartan from 25 mg to 50 mg daily The patient is instructed to check her blood pressure twice daily and we will have a telephone visit next week to address her blood pressure

## 2021-02-23 NOTE — Assessment & Plan Note (Signed)
She is much calmer today and is delighted that her treatment can be started today  We reviewed the current guidelines Goal of treatment is palliative I recommend switching her treatment with Lenvima and pembrolizumab.   The combination was approved following review conducted under [employer], an intiative of the [place] which provides a framework for concurrent submission and review of oncology drugs among international partners. The FDA approved the combination with the [place] Therapeutic Goods Administration and [facility] [place].   Efficacy of the drugs together was investigated in Study 111/KEYNOTE-146 ([id number]), a single-arm, multicenter, open-label, multi-cohort trial that enrolled 108 patients with metastatic endometrial cancer that had progressed following at least one prior systemic therapy in any setting.  Patients took 20 mg of lenvatinib orally once daily in combination with 200 mg of pembrolizumab administered intravenously every 3 weeks until unacceptable toxicity or disease progression.  Among the 108 patients, 94 had tumors that were not MSI-H or dMMR, 11 had tumors that were MSI-H or dMMR, and in 3 patients the tumor MSI-H or dMMR status was not known.  Results  The major efficacy outcome measures were objective response rate (ORR) and duration of response (DOR) by independent radiologic review committee using RECIST 1.1. The ORR in the 94 patients whose tumors were not MSI-H or dMMR was 38.3% (95% CI: 29%, 49%) with 10 complete responses (10.6%) and 26 partial responses (27.7%). Median DOR was not reached at the time of data cutoff and 25 patients (69% of responders) had response durations ?6 months.  In another updated publication published on JCO: DOI: 10.1200/JCO.19.02627 Journal of Clinical Oncology, Published online July 31, 2018. Lenvatinib Plus Pembrolizumab in Patients With Advanced Endometrial Cancer Abstract PURPOSE  Patients with  advanced endometrial carcinoma have limited treatment options. We report final primary efficacy analysis results for a patient cohort with advanced endometrial carcinoma receiving lenvatinib plus pembrolizumab in an ongoing phase Ib/II study of selected solid tumors. METHODS  Patients took lenvatinib 20 mg once daily orally plus pembrolizumab 200 mg intravenously once every 3 weeks, in 3-week cycles. The primary end point was objective response rate (ORR) at 24 weeks (LDJTT01); secondary efficacy end points included duration of response (DOR), progression-free survival (PFS), and overall survival (OS). Tumor assessments were evaluated by investigators per immune-related RECIST.  RESULTS  At data cutoff, 108 patients with previously treated endometrial carcinoma were enrolled, with a median follow-up of 18.7 months. The XBLTJ03 was 38.0% (95% CI, 28.8% to 47.8%). Among subgroups, the ESPQZ30 (95% CI) was 63.6% (30.8% to 89.1%) in patients with microsatellite instability (MSI)-high tumors (n = 11) and 36.2% (26.5% to 46.7%) in patients with microsatellite-stable tumors (n = 94). For previously treated patients, regardless of tumor MSI status, the median DOR was 21.2 months (95% CI, 7.6 months to not estimable), median PFS was 7.4 months (95% CI, 5.3 to 8.7 months), and median OS was 16.7 months (15.0 months to not estimable). Grade 3 or 4 treatment-related adverse events occurred in 83/124 (66.9%) patients. CONCLUSION  Lenvatinib plus pembrolizumab showed promising antitumor activity in patients with advanced endometrial carcinoma who have experienced disease progression after prior systemic therapy, regardless of tumor MSI status. The combination therapy had a manageable toxicity profile. The most common adverse reactions for endometrial cancer were fatigue, hypertension, musculoskeletal pain, diarrhea, decreased appetite, hypothyroidism, nausea, stomatitis, vomiting, decreased weight, abdominal pain, headache,  constipation, urinary tract infection, dysphonia, hemorrhagic events, hypomagnesemia, palmar-plantar erythrodysesthesia, dyspnea, cough, and rash.   The most common adverse reactions  for endometrial cancer were fatigue, hypertension, musculoskeletal pain, diarrhea, decreased appetite, hypothyroidism, nausea, stomatitis, vomiting, decreased weight, abdominal pain, headache, constipation, urinary tract infection, dysphonia, hemorrhagic events, hypomagnesemia, palmar-plantar erythrodysesthesia, dyspnea, cough, and rash.   After much discussion, the patient would like to proceed with the plan of care. Due to her baseline poorly controlled hypertension, I plan to start Lenvima at 10 mg daily She will get follow-up phone call next week to address her high blood pressure I recommend minimum 3 months of treatment before repeating imaging study

## 2021-02-23 NOTE — Telephone Encounter (Signed)
Oral Oncology Patient Advocate Encounter  Met patient in lobby room to complete application for [employer] in an effort to reduce patient's out of pocket expense for Lenvima to $0.    Application completed and faxed to [phone].   [employer] patient assistance phone number for follow up is [phone].   This encounter will be updated until final determination.   [facility] [place] Phone [phone] Fax [phone] 02/23/2021 8:11 AM

## 2021-02-23 NOTE — Telephone Encounter (Signed)
Called and scheduled appt today at 1200. She is aware of appt times.

## 2021-02-23 NOTE — Patient Instructions (Signed)
[facility]  Discharge Instructions: Thank you for choosing [facility] to provide your oncology and hematology care.   If you have a lab appointment with the Cancer Center, please go directly to the Cancer Center and check in at the registration area.   Wear comfortable clothing and clothing appropriate for easy access to any Portacath or PICC line.   We strive to give you quality time with your provider. You may need to reschedule your appointment if you arrive late (15 or more minutes).  Arriving late affects you and other patients whose appointments are after yours.  Also, if you miss three or more appointments without notifying the office, you may be dismissed from the clinic at the provider's discretion.      For prescription refill requests, have your pharmacy contact our office and allow 72 hours for refills to be completed.    Today you received the following chemotherapy and/or immunotherapy agents Keytruda      To help prevent nausea and vomiting after your treatment, we encourage you to take your nausea medication as directed.  BELOW ARE SYMPTOMS THAT SHOULD BE REPORTED IMMEDIATELY: *FEVER GREATER THAN 100.4 F (38 C) OR HIGHER *CHILLS OR SWEATING *NAUSEA AND VOMITING THAT IS NOT CONTROLLED WITH YOUR NAUSEA MEDICATION *UNUSUAL SHORTNESS OF BREATH *UNUSUAL BRUISING OR BLEEDING *URINARY PROBLEMS (pain or burning when urinating, or frequent urination) *BOWEL PROBLEMS (unusual diarrhea, constipation, pain near the anus) TENDERNESS IN MOUTH AND THROAT WITH OR WITHOUT PRESENCE OF ULCERS (sore throat, sores in mouth, or a toothache) UNUSUAL RASH, SWELLING OR PAIN  UNUSUAL VAGINAL DISCHARGE OR ITCHING   Items with * indicate a potential emergency and should be followed up as soon as possible or go to the Emergency Department if any problems should occur.  Please show the CHEMOTHERAPY ALERT CARD or IMMUNOTHERAPY ALERT CARD at check-in to  the Emergency Department and triage nurse.  Should you have questions after your visit or need to cancel or reschedule your appointment, please contact [facility]  Dept: [phone]  and follow the prompts.  Office hours are 8:00 a.m. to 4:30 p.m. Monday - Friday. Please note that voicemails left after 4:00 p.m. may not be returned until the following business day.  We are closed weekends and major holidays. You have access to a nurse at all times for urgent questions. Please call the main number to the clinic Dept: [phone] and follow the prompts.   For any non-urgent questions, you may also contact your provider using MyChart. We now offer e-Visits for anyone 18 and older to request care online for non-urgent symptoms. For details visit [email or web address].   Also download the MyChart app! Go to the app store, search "MyChart", open the app, select , and log in with your MyChart username and password.  Due to Covid, a mask is required upon entering the hospital/clinic. If you do not have a mask, one will be given to you upon arrival. For doctor visits, patients may have 1 support person aged 18 or older with them. For treatment visits, patients cannot have anyone with them due to current Covid guidelines and our immunocompromised population.   Pembrolizumab injection What is this medication? PEMBROLIZUMAB (pem broe liz ue mab) is a monoclonal antibody. It is used totreat certain types of cancer. This medicine may be used for other purposes; ask your health care provider orpharmacist if you have questions. COMMON BRAND NAME(S): Keytruda What should I   tell my care team before I take this medication? They need to know if you have any of these conditions: autoimmune diseases like Crohn's disease, ulcerative colitis, or lupus have had or planning to have an allogeneic stem cell transplant (uses someone else's stem cells) history of organ  transplant history of chest radiation nervous system problems like myasthenia gravis or Guillain-Barre syndrome an unusual or allergic reaction to pembrolizumab, other medicines, foods, dyes, or preservatives pregnant or trying to get pregnant breast-feeding How should I use this medication? This medicine is for infusion into a vein. It is given by a health careprofessional in a hospital or clinic setting. A special MedGuide will be given to you before each treatment. Be sure to readthis information carefully each time. Talk to your pediatrician regarding the use of this medicine in children. While this drug may be prescribed for children as young as 6 months for selectedconditions, precautions do apply. Overdosage: If you think you have taken too much of this medicine contact apoison control center or emergency room at once. NOTE: This medicine is only for you. Do not share this medicine with others. What if I miss a dose? It is important not to miss your dose. Call your doctor or health careprofessional if you are unable to keep an appointment. What may interact with this medication? Interactions have not been studied. This list may not describe all possible interactions. Give your health care provider a list of all the medicines, herbs, non-prescription drugs, or dietary supplements you use. Also tell them if you smoke, drink alcohol, or use illegaldrugs. Some items may interact with your medicine. What should I watch for while using this medication? Your condition will be monitored carefully while you are receiving thismedicine. You may need blood work done while you are taking this medicine. Do not become pregnant while taking this medicine or for 4 months after stopping it. Women should inform their doctor if they wish to become pregnant or think they might be pregnant. There is a potential for serious side effects to an unborn child. Talk to your health care professional or pharmacist for  more information. Do not breast-feed an infant while taking this medicine orfor 4 months after the last dose. What side effects may I notice from receiving this medication? Side effects that you should report to your doctor or health care professionalas soon as possible: allergic reactions like skin rash, itching or hives, swelling of the face, lips, or tongue bloody or black, tarry breathing problems changes in vision chest pain chills confusion constipation cough diarrhea dizziness or feeling faint or lightheaded fast or irregular heartbeat fever flushing joint pain low blood counts - this medicine may decrease the number of white blood cells, red blood cells and platelets. You may be at increased risk for infections and bleeding. muscle pain muscle weakness pain, tingling, numbness in the hands or feet persistent headache redness, blistering, peeling or loosening of the skin, including inside the mouth signs and symptoms of high blood sugar such as dizziness; dry mouth; dry skin; fruity breath; nausea; stomach pain; increased hunger or thirst; increased urination signs and symptoms of kidney injury like trouble passing urine or change in the amount of urine signs and symptoms of liver injury like dark urine, light-colored stools, loss of appetite, nausea, right upper belly pain, yellowing of the eyes or skin sweating swollen lymph nodes weight loss Side effects that usually do not require medical attention (report to yourdoctor or health care professional if they continue   or are bothersome): decreased appetite hair loss tiredness This list may not describe all possible side effects. Call your doctor for medical advice about side effects. You may report side effects to FDA at1-800-FDA-1088. Where should I keep my medication? This drug is given in a hospital or clinic and will not be stored at home. NOTE: This sheet is a summary. It may not cover all possible information. If you  have questions about this medicine, talk to your doctor, pharmacist, orhealth care provider.  2022 Elsevier/Gold Standard (2019-04-07 21:44:53)   

## 2021-02-23 NOTE — Progress Notes (Signed)
[place] OFFICE PROGRESS NOTE  Patient Care Team: [facility] [place] as PCP - General (Rheumatology) [name], [name], RN as Oncology Nurse Navigator (Oncology)  ASSESSMENT & PLAN:  Uterine cancer [facility]) She is much calmer today and is delighted that her treatment can be started today  We reviewed the current guidelines Goal of treatment is palliative I recommend switching her treatment with Lenvima and pembrolizumab.   The combination was approved following review conducted under [employer], an intiative of the [place] which provides a framework for concurrent submission and review of oncology drugs among international partners. The FDA approved the combination with the [place] Therapeutic Goods Administration and [facility] [place].   Efficacy of the drugs together was investigated in Study 111/KEYNOTE-146 ([id number]), a single-arm, multicenter, open-label, multi-cohort trial that enrolled 108 patients with metastatic endometrial cancer that had progressed following at least one prior systemic therapy in any setting.  Patients took 20 mg of lenvatinib orally once daily in combination with 200 mg of pembrolizumab administered intravenously every 3 weeks until unacceptable toxicity or disease progression.  Among the 108 patients, 94 had tumors that were not MSI-H or dMMR, 11 had tumors that were MSI-H or dMMR, and in 3 patients the tumor MSI-H or dMMR status was not known.  Results  The major efficacy outcome measures were objective response rate (ORR) and duration of response (DOR) by independent radiologic review committee using RECIST 1.1. The ORR in the 94 patients whose tumors were not MSI-H or dMMR was 38.3% (95% CI: 29%, 49%) with 10 complete responses (10.6%) and 26 partial responses (27.7%). Median DOR was not reached at the time of data cutoff and 25 patients (69% of responders) had response durations ?6 months.  In another  updated publication published on JCO: DOI: 10.1200/JCO.19.02627 Journal of Clinical Oncology, Published online July 31, 2018. Lenvatinib Plus Pembrolizumab in Patients With Advanced Endometrial Cancer Abstract PURPOSE  Patients with advanced endometrial carcinoma have limited treatment options. We report final primary efficacy analysis results for a patient cohort with advanced endometrial carcinoma receiving lenvatinib plus pembrolizumab in an ongoing phase Ib/II study of selected solid tumors. METHODS  Patients took lenvatinib 20 mg once daily orally plus pembrolizumab 200 mg intravenously once every 3 weeks, in 3-week cycles. The primary end point was objective response rate (ORR) at 24 weeks ([id number]); secondary efficacy end points included duration of response (DOR), progression-free survival (PFS), and overall survival (OS). Tumor assessments were evaluated by investigators per immune-related RECIST.  RESULTS  At data cutoff, 108 patients with previously treated endometrial carcinoma were enrolled, with a median follow-up of 18.7 months. The [id number] was 38.0% (95% CI, 28.8% to 47.8%). Among subgroups, the [id number] (95% CI) was 63.6% (30.8% to 89.1%) in patients with microsatellite instability (MSI)-high tumors (n = 11) and 36.2% (26.5% to 46.7%) in patients with microsatellite-stable tumors (n = 94). For previously treated patients, regardless of tumor MSI status, the median DOR was 21.2 months (95% CI, 7.6 months to not estimable), median PFS was 7.4 months (95% CI, 5.3 to 8.7 months), and median OS was 16.7 months (15.0 months to not estimable). Grade 3 or 4 treatment-related adverse events occurred in 83/124 (66.9%) patients. CONCLUSION  Lenvatinib plus pembrolizumab showed promising antitumor activity in patients with advanced endometrial carcinoma who have experienced disease progression after prior systemic therapy, regardless of tumor MSI status. The combination therapy had a manageable  toxicity profile. The most common adverse reactions for endometrial cancer were fatigue,  hypertension, musculoskeletal pain, diarrhea, decreased appetite, hypothyroidism, nausea, stomatitis, vomiting, decreased weight, abdominal pain, headache, constipation, urinary tract infection, dysphonia, hemorrhagic events, hypomagnesemia, palmar-plantar erythrodysesthesia, dyspnea, cough, and rash.   The most common adverse reactions for endometrial cancer were fatigue, hypertension, musculoskeletal pain, diarrhea, decreased appetite, hypothyroidism, nausea, stomatitis, vomiting, decreased weight, abdominal pain, headache, constipation, urinary tract infection, dysphonia, hemorrhagic events, hypomagnesemia, palmar-plantar erythrodysesthesia, dyspnea, cough, and rash.   After much discussion, the patient would like to proceed with the plan of care. Due to her baseline poorly controlled hypertension, I plan to start Lenvima at 10 mg daily She will get follow-up phone call next week to address her high blood pressure I recommend minimum 3 months of treatment before repeating imaging study  CKD (chronic kidney disease), stage III ([place]) She had fluctuation of her serum creatinine We will continue aggressive risk factor modification  Essential hypertension She has poorly controlled hypertension I plan to increase losartan from 25 mg to 50 mg daily The patient is instructed to check her blood pressure twice daily and we will have a telephone visit next week to address her blood pressure  Cancer associated pain I recommend the patient to refrain from taking NSAID due to increased risk of bleeding while on anticoagulation therapy She is instructed to take oxycodone, acetaminophen or combination as needed I warned her about risk of side effects including nausea and constipation  No orders of the defined types were placed in this encounter.   All questions were answered. The patient knows to call the clinic  with any problems, questions or concerns. The total time spent in the appointment was 40 minutes encounter with patients including review of chart and various tests results, discussions about plan of care and coordination of care plan   [name], MD 02/23/2021 12:47 PM  INTERVAL HISTORY: Please see below for problem oriented charting. she returns for treatment follow-up to start cycle 1 of combination pembrolizumab with lenvatinib for recurrent uterine cancer Since last time I saw her, she has made informed decision to proceed with this particular regimen She has mild intermittent abdominal discomfort that comes and goes but she rarely have to take oxycodone She had significant anxiety several days ago but much better She is aware that her blood pressure has gone up but denies symptoms of headache or changes in her vision She is eating frequent small meals and continue to have regular bowel movement  REVIEW OF SYSTEMS:   Constitutional: Denies fevers, chills or abnormal weight loss Eyes: Denies blurriness of vision Ears, nose, mouth, throat, and face: Denies mucositis or sore throat Respiratory: Denies cough, dyspnea or wheezes Cardiovascular: Denies palpitation, chest discomfort or lower extremity swelling Gastrointestinal:  Denies nausea, heartburn or change in bowel habits Skin: Denies abnormal skin rashes Lymphatics: Denies new lymphadenopathy or easy bruising Neurological:Denies numbness, tingling or new weaknesses Behavioral/Psych: Mood is stable, no new changes  All other systems were reviewed with the patient and are negative.  I have reviewed the past medical history, past surgical history, social history and family history with the patient and they are unchanged from previous note.  ALLERGIES:  has No Known Allergies.  MEDICATIONS:  Current Outpatient Medications  Medication Sig Dispense Refill   amLODipine (NORVASC) 10 MG tablet Take 1 tablet (10 mg total) by mouth  daily. 90 tablet 2   atenolol (TENORMIN) 50 MG tablet TAKE 1 TABLET(50 MG) BY MOUTH DAILY 30 tablet 2   cholecalciferol (VITAMIN D3) 25 MCG (1000 UNIT) tablet Take  1,000 Units by mouth daily.     lenvatinib 20 mg daily dose (LENVIMA) 2 x 10 MG capsule Take 1 capsule (10 mg total) by mouth daily. 30 capsule 11   lidocaine-prilocaine (EMLA) cream Apply 1 application topically daily as needed. 30 g 3   LORazepam (ATIVAN) 0.5 MG tablet TAKE 1 TABLET(0.5 MG) BY MOUTH TWICE DAILY AS NEEDED FOR ANXIETY 60 tablet 0   losartan (COZAAR) 50 MG tablet Take 1 tablet (50 mg total) by mouth daily. 30 tablet 3   MAGNESIUM-OXIDE 400 (240 Mg) MG tablet TAKE 1 TABLET(400 MG) BY MOUTH TWICE DAILY 60 tablet 1   ondansetron (ZOFRAN) 8 MG tablet Take 1 tablet (8 mg total) by mouth every 8 (eight) hours as needed for nausea. 30 tablet 3   oxybutynin (DITROPAN) 5 MG tablet Take 1 tablet (5 mg total) by mouth every 8 (eight) hours as needed for bladder spasms. 90 tablet 1   oxyCODONE (OXY IR/ROXICODONE) 5 MG immediate release tablet Take 1 tablet (5 mg total) by mouth every 4 (four) hours as needed for severe pain. 30 tablet 0   polyethylene glycol (MIRALAX / GLYCOLAX) 17 g packet Take 17 g by mouth 2 (two) times daily as needed for moderate constipation. 14 each 0   prochlorperazine (COMPAZINE) 10 MG tablet Take 1 tablet (10 mg total) by mouth every 6 (six) hours as needed for nausea or vomiting. 30 tablet 1   RIVAROXABAN (XARELTO) VTE STARTER PACK (15 & 20 MG) Follow package directions: Take one $Remove'15mg'OFmGBxA$  tablet by mouth twice a day. On day 22, switch to one $Remo'20mg'lVSMv$  tablet once a day. Take with food. 51 each 0   senna-docusate (SENOKOT-S) 8.6-50 MG tablet Take 1 tablet by mouth 2 (two) times daily. 30 tablet 1   No current facility-administered medications for this visit.    SUMMARY OF ONCOLOGIC HISTORY: Oncology History Overview Note  High grade serous MMR normal Her2 negative   Uterine cancer ([place])  08/28/2020 Imaging    1. Pelvic mass measuring 7.5 x 4.8 cm, contiguous with the sigmoid colon and uterus with loss of fat planes. Site of origin is unclear, may be ovarian, uterine, or colonic. Lack of contrast limits detailed assessment. 2. Above pelvic mass causes partial obstruction of the left ureter with mild left hydroureteronephrosis. 3. Multifocal adenopathy in the abdomen and pelvis, suspicious for metastatic disease, primarily in the mesentery and lower peritoneum and pelvis. Please note that detailed assessment of adenopathy is limited on this noncontrast exam. Recommend oncology referral.  4. Wall thickening of the sigmoid colon distal to the left pelvic mass with pericolonic edema, suspicious for colitis. There is a moderate to large amount of stool in the more proximal colon, suggesting pelvic mass may be causing delayed colonic transit. 5. Small to moderate hiatal hernia.   08/28/2020 - 09/04/2020 Hospital Admission   The patient has been complaining of reduced urine output, loss of appetite and changes in bowel habits She had abnormal imaging study leading to hospitalization, colonoscopy, biopsy and subsequently chemotherapy   08/30/2020 Procedure   CT-guided biopsy of enlarged left external iliac chain lymph node as above   08/30/2020 Pathology Results   SURGICAL PATHOLOGY  CASE: [id number]  PATIENT: Teresa Oliver  Surgical Pathology Report  Clinical History: Pelvic mass with metastatic lymph adenopathy (crm)  FINAL MICROSCOPIC DIAGNOSIS:   A. LYMPH NODE, LEFT EXTERNAL ILIAC CHAIN, BIOPSY:  - Poorly differentiated carcinoma with necrosis.  - See comment.   COMMENT:  The carcinoma is  characterized by diffuse sheets of malignant epithelioid cells with prominent nucleoli and frequent mitotic figures. Immunohistochemistry is positive with cytokeratin 7 and PAX 8 with weak  positivity with estrogen receptor and WT1.  P53 shows diffuse strong nuclear positivity.  The carcinoma is negative with  cytokeratin 20, CDX2, GATA3, GCDFP, TTF-1, HepPar 1, arginase 1 and CD10.  The  morphology and immunophenotype are most consistent with a gynecologic primary including high-grade serous carcinoma.    08/31/2020 Procedure   Colonoscopy - Preparation of the colon was fair. - Likely malignant partially obstructing tumor in the sigmoid colon (appears extrinsic process). Biopsied. Tattooed. - Internal hemorrhoids. - Stool in the entire examined colon   09/01/2020 Initial Diagnosis   Uterine cancer ([place])   09/01/2020 Procedure   Successful placement of a power injectable Port-A-Cath via the right internal jugular vein. The catheter is ready for immediate use.   09/04/2020 - 12/29/2020 Chemotherapy    Patient is on Treatment Plan: UTERINE CARBOPLATIN AUC 6 / PACLITAXEL Q21D       09/07/2020 Cancer Staging   Staging form: Corpus Uteri - Carcinoma and Carcinosarcoma, AJCC 8th Edition - Clinical: FIGO Stage IVA (cT4, cN2a, cM0) - Signed by [name], MD on 09/07/2020   11/15/2020 Imaging   1. Marked interval response to therapy with decrease in size of the mass extending from the uterus to the colon that was seen on the previous examination. Also with decreased size and or resolution of pelvic, mesenteric and retroperitoneal adenopathy seen on previous imaging. 2. Tethering of the colon with area that raise the question of developing colovaginal fistula versus internal enhancement within the mass that is now decreased in size. Attention on follow-up is suggested. Correlate with any current signs of vaginal drainage. 3. LEFT thoracic inlet adenopathy compatible with involvement in the chest. Area not imaged on previous imaging.   02/18/2021 Imaging   1. Interval progression of left supraclavicular,, left thoracic inlet, left subpectoral, retroperitoneal, mesenteric and pelvic sidewall lymphadenopathy. Metastatic deposits in the central pelvis are also progressive. 2. Interval progression of left  subpectoral lymphadenopathy with thrombus now visible in the left subclavian/innominate vein. 3. Aortic Atherosclerosis (ICD10-I70.0).   02/23/2021 -  Chemotherapy   Patient is on Treatment Plan : UTERINE Lenvatinib + Pembrolizumab q21d       PHYSICAL EXAMINATION: ECOG PERFORMANCE STATUS: 1 - Symptomatic but completely ambulatory  Vitals:   02/23/21 1210  BP: (!) 151/86  Pulse: 80  Resp: (!) 98  Temp: 97.6 F (36.4 C)  SpO2: 100%   Filed Weights   02/23/21 1210  Weight: 110 lb 9.6 oz (50.2 kg)    GENERAL:alert, no distress and comfortable NEURO: alert & oriented x 3 with fluent speech, no focal motor/sensory deficits  LABORATORY DATA:  I have reviewed the data as listed    Component Value Date/Time   NA 133 (L) 02/23/2021 1157   K 4.5 02/23/2021 1157   CL 98 02/23/2021 1157   CO2 23 02/23/2021 1157   GLUCOSE 123 (H) 02/23/2021 1157   BUN 33 (H) 02/23/2021 1157   CREATININE 1.54 (H) 02/23/2021 1157   CALCIUM 9.8 02/23/2021 1157   PROT 7.7 02/23/2021 1157   ALBUMIN 3.5 02/23/2021 1157   AST 16 02/23/2021 1157   ALT 15 02/23/2021 1157   ALKPHOS 133 (H) 02/23/2021 1157   BILITOT 0.4 02/23/2021 1157   GFRNONAA 35 (L) 02/23/2021 1157    No results found for: SPEP, UPEP  Lab Results  Component Value Date  WBC 8.7 02/23/2021   NEUTROABS 6.5 02/23/2021   HGB 9.1 (L) 02/23/2021   HCT 29.0 (L) 02/23/2021   MCV 72.7 (L) 02/23/2021   PLT 283 02/23/2021      Chemistry      Component Value Date/Time   NA 133 (L) 02/23/2021 1157   K 4.5 02/23/2021 1157   CL 98 02/23/2021 1157   CO2 23 02/23/2021 1157   BUN 33 (H) 02/23/2021 1157   CREATININE 1.54 (H) 02/23/2021 1157      Component Value Date/Time   CALCIUM 9.8 02/23/2021 1157   ALKPHOS 133 (H) 02/23/2021 1157   AST 16 02/23/2021 1157   ALT 15 02/23/2021 1157   BILITOT 0.4 02/23/2021 1157

## 2021-02-23 NOTE — Telephone Encounter (Signed)
Oral Chemotherapy Pharmacist Encounter  I spoke with patient for overview of: Lenvima (lenvatinib) for the treatment of recurrent uterine cancer in conjunction with pembrolizumab, planned duration until disease progression or unacceptable toxicity.   Counseled patient on administration, dosing, side effects, monitoring, drug-food interactions, safe handling, storage, and disposal.  Patient will take Lenvima 10mg  capsules, 1 capsules (10mg ) by mouth once daily, with or without food, at approximately the same time each day.  Lenvima start date: 02/24/2021  Adverse effects include but are not limited to: hypertension, hand-foot syndrome, diarrhea, joint pain, fatigue, headache, decreased calcium, proteinuria, increased risk of blood clots, and cardiac conduction issues.   Patient will obtain anti diarrheal and alert the office of 4 or more loose stools above baseline.  Patient instructed to notify office of any upcoming invasive procedures.  Michel Santee will be held for 6 days prior to scheduled surgery, restart based on healing and clinical judgement.   Reviewed with patient importance of keeping a medication schedule and plan for any missed doses. No barriers to medication adherence identified.  Medication reconciliation performed and medication/allergy list updated.  Insurance authorization for Michel Santee has been obtained. Patient enrolled in patient assistance and has been notified to set up shipment. She will call them by 7pm tonight if she has not heard from them.   Patient informed the pharmacy will reach out 5-7 days prior to needing next fill of Lenvima to coordinate continued medication acquisition to prevent break in therapy.  All questions answered.  Mrs. Adamec voiced understanding and appreciation.   Medication education handout placed in mail for patient. Patient knows to call the office with questions or concerns. Oral Chemotherapy Clinic phone number provided to patient.    Drema Halon, PharmD Hematology/Oncology Clinical Pharmacist [facility] [phone]  02/23/2021   9:57 AM

## 2021-02-24 LAB — T4: T4, Total: 9.2 ug/dL (ref 4.5–12.0)

## 2021-02-25 ENCOUNTER — Other Ambulatory Visit: Payer: Self-pay | Admitting: Hematology and Oncology

## 2021-02-26 ENCOUNTER — Encounter: Payer: Self-pay | Admitting: Hematology and Oncology

## 2021-02-26 ENCOUNTER — Telehealth: Payer: Self-pay | Admitting: *Deleted

## 2021-02-26 NOTE — Telephone Encounter (Signed)
Given below message to Dr. Alvy Bimler. She was given a 7 day supply of Lenvima Friday and started Rx Saturday. Instructed to call manufacturer at [phone] to set up delivery of Lenvima. She verbalized understanding and will call them now.

## 2021-02-26 NOTE — Telephone Encounter (Signed)
-----   Message from Derrick Ravel, RN sent at 02/23/2021  2:40 PM EDT ----- Regarding: First time Keytruda, Dr. Alvy Bimler patient. First time Keytruda, Dr. Alvy Bimler patient. She tolerated infusion well, please give follow up phone call. Thank you!

## 2021-02-26 NOTE — Telephone Encounter (Signed)
Called pt to see how she did with her new treatment.  She reports that she thinks she is doing well.  She reported that Dr. Alvy Bimler increased her BP med & is watching.  She state that her BP was low on Sun am 77/65 & she didn't take her Losartan that day.  Her Bp was back up that eve to 142/83.  She took her regular eve meds-amlodipine & tenormin.  She had constipation over the w/e & took her senna & had a loose stool this am but she says not diarrhea.  She states that she is eating & has a good appetite & food taste good but not eating as much as usual.  She states that she is working on sleep issues which she has had for a long time.  She still has not received her Lenvima & will call [facility] today to check on .  Message routed to Dr Marlinda Mike RN

## 2021-02-27 ENCOUNTER — Other Ambulatory Visit: Payer: Medicare Other

## 2021-02-27 ENCOUNTER — Telehealth: Payer: Self-pay

## 2021-02-27 ENCOUNTER — Ambulatory Visit: Payer: Medicare Other | Admitting: Hematology and Oncology

## 2021-02-27 NOTE — Telephone Encounter (Signed)
Oral Chemotherapy Pharmacist Encounter  Called patient regarding Lenvima delivery date. Patient states that she will receive the medication from [facility] today.   Patient has been taking medication since Saturday and appreciates the phone call to check in on the status from the outside pharmacy.   Drema Halon, PharmD Hematology/Oncology Clinical Pharmacist [facility] [phone]

## 2021-03-02 ENCOUNTER — Telehealth: Payer: Self-pay

## 2021-03-02 NOTE — Telephone Encounter (Signed)
-----   Message from [name], MD sent at 03/02/2021  9:24 AM EDT ----- Can you call for BP reading?

## 2021-03-02 NOTE — Telephone Encounter (Signed)
Called and given below message.  10/10 am bp 142/81, HR 66  PM bp 167/88, HR 82 10/11 am bp 141/82 HR 74 PM  bp 150/87 HR 81 10/12 am bp 139/82 HR 79 PM bp 164/97 HR 85 10/13 am bp 189/93 HR 78 PM BP 142/90, HR 71 10/14 am 135/85, HR 80  She is asking if she should take bp meds different?  Norvasc and Atenolol she takes around 6 pm daily. Losartan she takes around 8 am daily. She takes the chemo pill at 10 am daily.

## 2021-03-02 NOTE — Telephone Encounter (Signed)
Called and given below message. She verbalized understanding. 

## 2021-03-02 NOTE — Telephone Encounter (Signed)
For now keep taking BP the same way, no changes in dose

## 2021-03-13 ENCOUNTER — Other Ambulatory Visit: Payer: Self-pay | Admitting: Hematology and Oncology

## 2021-03-13 ENCOUNTER — Other Ambulatory Visit (HOSPITAL_COMMUNITY): Payer: Self-pay

## 2021-03-13 DIAGNOSIS — I82C12 Acute embolism and thrombosis of left internal jugular vein: Secondary | ICD-10-CM

## 2021-03-13 MED ORDER — RIVAROXABAN 20 MG PO TABS
20.0000 mg | ORAL_TABLET | Freq: Every day | ORAL | 3 refills | Status: DC
  Filled 2021-03-13: qty 30, 30d supply, fill #0
  Filled 2021-04-20: qty 30, 30d supply, fill #1
  Filled 2021-05-19: qty 30, 30d supply, fill #2

## 2021-03-16 ENCOUNTER — Inpatient Hospital Stay: Payer: Medicare Other

## 2021-03-16 ENCOUNTER — Encounter: Payer: Self-pay | Admitting: Hematology and Oncology

## 2021-03-16 ENCOUNTER — Other Ambulatory Visit: Payer: Self-pay

## 2021-03-16 ENCOUNTER — Inpatient Hospital Stay: Payer: Medicare Other | Admitting: Hematology and Oncology

## 2021-03-16 DIAGNOSIS — I10 Essential (primary) hypertension: Secondary | ICD-10-CM

## 2021-03-16 DIAGNOSIS — N183 Chronic kidney disease, stage 3 unspecified: Secondary | ICD-10-CM

## 2021-03-16 DIAGNOSIS — I82C12 Acute embolism and thrombosis of left internal jugular vein: Secondary | ICD-10-CM | POA: Diagnosis not present

## 2021-03-16 DIAGNOSIS — I82C13 Acute embolism and thrombosis of internal jugular vein, bilateral: Secondary | ICD-10-CM | POA: Diagnosis not present

## 2021-03-16 DIAGNOSIS — Z23 Encounter for immunization: Secondary | ICD-10-CM | POA: Diagnosis not present

## 2021-03-16 DIAGNOSIS — C549 Malignant neoplasm of corpus uteri, unspecified: Secondary | ICD-10-CM

## 2021-03-16 DIAGNOSIS — D649 Anemia, unspecified: Secondary | ICD-10-CM | POA: Diagnosis not present

## 2021-03-16 DIAGNOSIS — Z7901 Long term (current) use of anticoagulants: Secondary | ICD-10-CM | POA: Diagnosis not present

## 2021-03-16 DIAGNOSIS — I129 Hypertensive chronic kidney disease with stage 1 through stage 4 chronic kidney disease, or unspecified chronic kidney disease: Secondary | ICD-10-CM | POA: Diagnosis not present

## 2021-03-16 DIAGNOSIS — G893 Neoplasm related pain (acute) (chronic): Secondary | ICD-10-CM

## 2021-03-16 DIAGNOSIS — Z79891 Long term (current) use of opiate analgesic: Secondary | ICD-10-CM | POA: Diagnosis not present

## 2021-03-16 DIAGNOSIS — N179 Acute kidney failure, unspecified: Secondary | ICD-10-CM

## 2021-03-16 DIAGNOSIS — Z5111 Encounter for antineoplastic chemotherapy: Secondary | ICD-10-CM | POA: Diagnosis not present

## 2021-03-16 DIAGNOSIS — D539 Nutritional anemia, unspecified: Secondary | ICD-10-CM | POA: Diagnosis not present

## 2021-03-16 DIAGNOSIS — Z79899 Other long term (current) drug therapy: Secondary | ICD-10-CM | POA: Diagnosis not present

## 2021-03-16 LAB — CMP (CANCER CENTER ONLY)
ALT: 61 U/L — ABNORMAL HIGH (ref 0–44)
AST: 46 U/L — ABNORMAL HIGH (ref 15–41)
Albumin: 3.6 g/dL (ref 3.5–5.0)
Alkaline Phosphatase: 198 U/L — ABNORMAL HIGH (ref 38–126)
Anion gap: 11 (ref 5–15)
BUN: 30 mg/dL — ABNORMAL HIGH (ref 8–23)
CO2: 22 mmol/L (ref 22–32)
Calcium: 9.8 mg/dL (ref 8.9–10.3)
Chloride: 101 mmol/L (ref 98–111)
Creatinine: 1.32 mg/dL — ABNORMAL HIGH (ref 0.44–1.00)
GFR, Estimated: 42 mL/min — ABNORMAL LOW (ref >60)
Glucose, Bld: 102 mg/dL — ABNORMAL HIGH (ref 70–99)
Potassium: 4.4 mmol/L (ref 3.5–5.1)
Sodium: 134 mmol/L — ABNORMAL LOW (ref 135–145)
Total Bilirubin: 0.5 mg/dL (ref 0.3–1.2)
Total Protein: 8.1 g/dL (ref 6.5–8.1)

## 2021-03-16 LAB — CBC WITH DIFFERENTIAL (CANCER CENTER ONLY)
Abs Immature Granulocytes: 0.04 10*3/uL (ref 0.00–0.07)
Basophils Absolute: 0 10*3/uL (ref 0.0–0.1)
Basophils Relative: 1 %
Eosinophils Absolute: 0.2 10*3/uL (ref 0.0–0.5)
Eosinophils Relative: 3 %
HCT: 29.1 % — ABNORMAL LOW (ref 36.0–46.0)
Hemoglobin: 9.2 g/dL — ABNORMAL LOW (ref 12.0–15.0)
Immature Granulocytes: 1 %
Lymphocytes Relative: 20 %
Lymphs Abs: 1.1 10*3/uL (ref 0.7–4.0)
MCH: 22.5 pg — ABNORMAL LOW (ref 26.0–34.0)
MCHC: 31.6 g/dL (ref 30.0–36.0)
MCV: 71.3 fL — ABNORMAL LOW (ref 80.0–100.0)
Monocytes Absolute: 0.6 10*3/uL (ref 0.1–1.0)
Monocytes Relative: 11 %
Neutro Abs: 3.5 10*3/uL (ref 1.7–7.7)
Neutrophils Relative %: 64 %
Platelet Count: 229 10*3/uL (ref 150–400)
RBC: 4.08 MIL/uL (ref 3.87–5.11)
RDW: 16.2 % — ABNORMAL HIGH (ref 11.5–15.5)
WBC Count: 5.4 10*3/uL (ref 4.0–10.5)
nRBC: 0 % (ref 0.0–0.2)

## 2021-03-16 LAB — SAMPLE TO BLOOD BANK

## 2021-03-16 LAB — TSH: TSH: 2.259 u[IU]/mL (ref 0.308–3.960)

## 2021-03-16 LAB — TOTAL PROTEIN, URINE DIPSTICK: Protein, ur: 300 mg/dL — AB

## 2021-03-16 MED ORDER — HEPARIN SOD (PORK) LOCK FLUSH 100 UNIT/ML IV SOLN
500.0000 [IU] | Freq: Once | INTRAVENOUS | Status: AC | PRN
  Administered 2021-03-16: 500 [IU]

## 2021-03-16 MED ORDER — SODIUM CHLORIDE 0.9 % IV SOLN: Freq: Once | INTRAVENOUS | Status: AC

## 2021-03-16 MED ORDER — INFLUENZA VAC A&B SA ADJ QUAD 0.5 ML IM PRSY
0.5000 mL | PREFILLED_SYRINGE | Freq: Once | INTRAMUSCULAR | Status: AC
  Administered 2021-03-16: 0.5 mL via INTRAMUSCULAR
  Filled 2021-03-16: qty 0.5

## 2021-03-16 MED ORDER — DOXAZOSIN MESYLATE 4 MG PO TABS: 4.0000 mg | ORAL_TABLET | Freq: Every day | ORAL | 3 refills | Status: DC

## 2021-03-16 MED ORDER — SODIUM CHLORIDE 0.9% FLUSH: 10.0000 mL | Freq: Once | INTRAVENOUS | Status: DC

## 2021-03-16 MED ORDER — SODIUM CHLORIDE 0.9% FLUSH
10.0000 mL | INTRAVENOUS | Status: DC | PRN
  Administered 2021-03-16: 10 mL

## 2021-03-16 MED ORDER — SODIUM CHLORIDE 0.9 % IV SOLN
200.0000 mg | Freq: Once | INTRAVENOUS | Status: AC
Start: 2021-03-16 — End: 2021-03-16
  Administered 2021-03-16: 200 mg via INTRAVENOUS
  Filled 2021-03-16: qty 8

## 2021-03-16 NOTE — Assessment & Plan Note (Signed)
She tolerated treatment well except for uncontrolled hypertension I recommend we proceed with treatment today with additional blood pressure medication added to her list I will set up virtual visit next week for toxicity review and blood pressure management I recommend minimum 3 months of treatment before repeating CT imaging

## 2021-03-16 NOTE — Assessment & Plan Note (Signed)
She has poorly controlled hypertension I recommend addition of Cardura I will set up appointment next week for blood pressure monitoring and medication adjustment

## 2021-03-16 NOTE — Patient Instructions (Signed)
[facility] MEDICAL [facility]COLOG[facility]   ?Discharge Instructions: ?Thank you for choosing [facility] to provide your oncology and hematology care.  ? ?If you have a lab appointment with the Cancer Center, please go directly to the Cancer Center and check in at the registration area. ?  ?Wear comfortable clothing and clothing appropriate for easy access to any Portacath or PICC line.  ? ?We strive to give you quality time with your provider. You may need to reschedule your appointment if you arrive late (15 or more minutes).  Arriving late affects you and other patients whose appointments are after yours.  Also, if you miss three or more appointments without notifying the office, you may be dismissed from the clinic at the provider?s discretion.    ?  ?For prescription refill requests, have your pharmacy contact our office and allow 72 hours for refills to be completed.   ? ?Today you received the following chemotherapy and/or immunotherapy agents: pembrolizumab    ?  ?To help prevent nausea and vomiting after your treatment, we encourage you to take your nausea medication as directed. ? ?BELOW ARE SYMPTOMS THAT SHOULD BE REPORTED IMMEDIATELY: ?*FEVER GREATER THAN 100.4 F (38 ?C) OR HIGHER ?*CHILLS OR SWEATING ?*NAUSEA AND VOMITING THAT IS NOT CONTROLLED WITH YOUR NAUSEA MEDICATION ?*UNUSUAL SHORTNESS OF BREATH ?*UNUSUAL BRUISING OR BLEEDING ?*URINARY PROBLEMS (pain or burning when urinating, or frequent urination) ?*BOWEL PROBLEMS (unusual diarrhea, constipation, pain near the anus) ?TENDERNESS IN MOUTH AND THROAT WITH OR WITHOUT PRESENCE OF ULCERS (sore throat, sores in mouth, or a toothache) ?UNUSUAL RASH, SWELLING OR PAIN  ?UNUSUAL VAGINAL DISCHARGE OR ITCHING  ? ?Items with * indicate a potential emergency and should be followed up as soon as possible or go to the Emergency Department if any problems should occur. ? ?Please show the CHEMOTHERAPY ALERT CARD or IMMUNOTHERAPY ALERT CARD at  check-in to the Emergency Department and triage nurse. ? ?Should you have questions after your visit or need to cancel or reschedule your appointment, please contact [facility]  Dept: [phone]  and follow the prompts.  Office hours are 8:00 a.m. to 4:30 p.m. Monday - Friday. Please note that voicemails left after 4:00 p.m. may not be returned until the following business day.  We are closed weekends and major holidays. You have access to a nurse at all times for urgent questions. Please call the main number to the clinic Dept: [phone] and follow the prompts. ? ? ?For any non-urgent questions, you may also contact your provider using MyChart. We now offer e-Visits for anyone 18 and older to request care online for non-urgent symptoms. For details visit [email or web address]. ?  ?Also download the MyChart app! Go to the app store, search "MyChart", open the app, select [facility], and log in with your MyChart username and password. ? ?Due to Covid, a mask is required upon entering the hospital/clinic. If you do not have a mask, one will be given to you upon arrival. For doctor visits, patients may have 1 support person aged 18 or older with them. For treatment visits, patients cannot have anyone with them due to current Covid guidelines and our immunocompromised population.  ? ?

## 2021-03-16 NOTE — Progress Notes (Signed)
[facility] OFFICE PROGRESS NOTE  Patient Care Team: [facility] [place] as PCP - General (Rheumatology) [name], [name], RN as Oncology Nurse Navigator (Oncology)  ASSESSMENT & PLAN:  Uterine cancer [facility]) She tolerated treatment well except for uncontrolled hypertension I recommend we proceed with treatment today with additional blood pressure medication added to her list I will set up virtual visit next week for toxicity review and blood pressure management I recommend minimum 3 months of treatment before repeating CT imaging  Essential hypertension She has poorly controlled hypertension I recommend addition of Cardura I will set up appointment next week for blood pressure monitoring and medication adjustment  CKD (chronic kidney disease), stage III ([place]) She had fluctuation of her serum creatinine We will continue aggressive risk factor modification  Deficiency anemia She has multifactorial anemia, combination of thalassemia and recent anemia chronic illness Her blood count has stabilized since we switch her treatment Monitor closely She denies recent bleeding  Acute venous embolism and thrombosis of internal jugular veins ([place]) She tolerated anticoagulation therapy well We will continue the same  Cancer associated pain She has minimum pain today She will continue to take pain medicine as needed  No orders of the defined types were placed in this encounter.   All questions were answered. The patient knows to call the clinic with any problems, questions or concerns. The total time spent in the appointment was 30 minutes encounter with patients including review of chart and various tests results, discussions about plan of care and coordination of care plan   Teresa Lark, MD 03/16/2021 2:52 PM  INTERVAL HISTORY: Please see below for problem oriented charting. she returns for treatment follow-up on pembrolizumab and Lenvima for recurrent metastatic  uterine cancer She felt better She has less pain No recent bleeding complications Denies abdominal pain, nausea or changes in bowel habits She brought with her documented blood pressure from home Majority of her documented blood pressure is quite high especially in the evening with systolic blood pressure in the 591 and diastolic as high as 638  REVIEW OF SYSTEMS:   Constitutional: Denies fevers, chills or abnormal weight loss Eyes: Denies blurriness of vision Ears, nose, mouth, throat, and face: Denies mucositis or sore throat Respiratory: Denies cough, dyspnea or wheezes Cardiovascular: Denies palpitation, chest discomfort or lower extremity swelling Gastrointestinal:  Denies nausea, heartburn or change in bowel habits Skin: Denies abnormal skin rashes Lymphatics: Denies new lymphadenopathy or easy bruising Neurological:Denies numbness, tingling or new weaknesses Behavioral/Psych: Mood is stable, no new changes  All other systems were reviewed with the patient and are negative.  I have reviewed the past medical history, past surgical history, social history and family history with the patient and they are unchanged from previous note.  ALLERGIES:  has No Known Allergies.  MEDICATIONS:  Current Outpatient Medications  Medication Sig Dispense Refill   doxazosin (CARDURA) 4 MG tablet Take 1 tablet (4 mg total) by mouth daily. 30 tablet 3   amLODipine (NORVASC) 10 MG tablet Take 1 tablet (10 mg total) by mouth daily. 90 tablet 2   atenolol (TENORMIN) 50 MG tablet TAKE 1 TABLET(50 MG) BY MOUTH DAILY 30 tablet 2   cholecalciferol (VITAMIN D3) 25 MCG (1000 UNIT) tablet Take 1,000 Units by mouth daily.     lenvatinib 20 mg daily dose (LENVIMA) 2 x 10 MG capsule Take 1 capsule (10 mg total) by mouth daily. 30 capsule 11   lidocaine-prilocaine (EMLA) cream Apply 1 application topically daily as  needed. 30 g 3   LORazepam (ATIVAN) 0.5 MG tablet TAKE 1 TABLET(0.5 MG) BY MOUTH TWICE DAILY AS  NEEDED FOR ANXIETY 60 tablet 0   losartan (COZAAR) 50 MG tablet Take 1 tablet (50 mg total) by mouth daily. 30 tablet 3   MAGNESIUM-OXIDE 400 (240 Mg) MG tablet TAKE 1 TABLET(400 MG) BY MOUTH TWICE DAILY 60 tablet 1   ondansetron (ZOFRAN) 8 MG tablet Take 1 tablet (8 mg total) by mouth every 8 (eight) hours as needed for nausea. 30 tablet 3   oxybutynin (DITROPAN) 5 MG tablet Take 1 tablet (5 mg total) by mouth every 8 (eight) hours as needed for bladder spasms. 90 tablet 1   oxyCODONE (OXY IR/ROXICODONE) 5 MG immediate release tablet Take 1 tablet (5 mg total) by mouth every 4 (four) hours as needed for severe pain. 30 tablet 0   polyethylene glycol (MIRALAX / GLYCOLAX) 17 g packet Take 17 g by mouth 2 (two) times daily as needed for moderate constipation. 14 each 0   prochlorperazine (COMPAZINE) 10 MG tablet Take 1 tablet (10 mg total) by mouth every 6 (six) hours as needed for nausea or vomiting. 30 tablet 1   rivaroxaban (XARELTO) 20 MG TABS tablet Take 1 tablet (20 mg total) by mouth daily with supper. 30 tablet 3   senna-docusate (SENOKOT-S) 8.6-50 MG tablet Take 1 tablet by mouth 2 (two) times daily. 30 tablet 1   No current facility-administered medications for this visit.   Facility-Administered Medications Ordered in Other Visits  Medication Dose Route Frequency Provider Last Rate Last Admin   heparin lock flush 100 unit/mL  500 Units Intracatheter Once PRN Alvy Bimler, Vladimir Lenhoff, MD       pembrolizumab (KEYTRUDA) 200 mg in sodium chloride 0.9 % 50 mL chemo infusion  200 mg Intravenous Once Alvy Bimler, Orlena Garmon, MD 116 mL/hr at 03/16/21 1425 200 mg at 03/16/21 1425   sodium chloride flush (NS) 0.9 % injection 10 mL  10 mL Intracatheter PRN Teresa Lark, MD        SUMMARY OF ONCOLOGIC HISTORY: Oncology History Overview Note  High grade serous MMR normal Her2 negative   Uterine cancer ([place])  08/28/2020 Imaging   1. Pelvic mass measuring 7.5 x 4.8 cm, contiguous with the sigmoid colon and uterus with  loss of fat planes. Site of origin is unclear, may be ovarian, uterine, or colonic. Lack of contrast limits detailed assessment. 2. Above pelvic mass causes partial obstruction of the left ureter with mild left hydroureteronephrosis. 3. Multifocal adenopathy in the abdomen and pelvis, suspicious for metastatic disease, primarily in the mesentery and lower peritoneum and pelvis. Please note that detailed assessment of adenopathy is limited on this noncontrast exam. Recommend oncology referral.  4. Wall thickening of the sigmoid colon distal to the left pelvic mass with pericolonic edema, suspicious for colitis. There is a moderate to large amount of stool in the more proximal colon, suggesting pelvic mass may be causing delayed colonic transit. 5. Small to moderate hiatal hernia.   08/28/2020 - 09/04/2020 Hospital Admission   The patient has been complaining of reduced urine output, loss of appetite and changes in bowel habits She had abnormal imaging study leading to hospitalization, colonoscopy, biopsy and subsequently chemotherapy   08/30/2020 Procedure   CT-guided biopsy of enlarged left external iliac chain lymph node as above   08/30/2020 Pathology Results   SURGICAL PATHOLOGY  CASE: [id number]  PATIENT: [facility]  Surgical Pathology Report  Clinical History: Pelvic mass with metastatic lymph  adenopathy ([facility])  FINAL MICROSCOPIC DIAGNOSIS:   A. LYMPH NODE, LEFT EXTERNAL ILIAC CHAIN, BIOPSY:  - Poorly differentiated carcinoma with necrosis.  - See comment.   COMMENT:  The carcinoma is characterized by diffuse sheets of malignant epithelioid cells with prominent nucleoli and frequent mitotic figures. Immunohistochemistry is positive with cytokeratin 7 and PAX 8 with weak  positivity with estrogen receptor and WT1.  P53 shows diffuse strong nuclear positivity.  The carcinoma is negative with cytokeratin 20, CDX2, GATA3, GCDFP, TTF-1, HepPar 1, arginase 1 and CD10.  The  morphology and  immunophenotype are most consistent with a gynecologic primary including high-grade serous carcinoma.    08/31/2020 Procedure   Colonoscopy - Preparation of the colon was fair. - Likely malignant partially obstructing tumor in the sigmoid colon (appears extrinsic process). Biopsied. Tattooed. - Internal hemorrhoids. - Stool in the entire examined colon   09/01/2020 Initial Diagnosis   Uterine cancer ([place])   09/01/2020 Procedure   Successful placement of a power injectable Port-A-Cath via the right internal jugular vein. The catheter is ready for immediate use.   09/04/2020 - 12/29/2020 Chemotherapy    Patient is on Treatment Plan: UTERINE CARBOPLATIN AUC 6 / PACLITAXEL Q21D       09/07/2020 Cancer Staging   Staging form: Corpus Uteri - Carcinoma and Carcinosarcoma, AJCC 8th Edition - Clinical: FIGO Stage IVA (cT4, cN2a, cM0) - Signed by Teresa Lark, MD on 09/07/2020    11/15/2020 Imaging   1. Marked interval response to therapy with decrease in size of the mass extending from the uterus to the colon that was seen on the previous examination. Also with decreased size and or resolution of pelvic, mesenteric and retroperitoneal adenopathy seen on previous imaging. 2. Tethering of the colon with area that raise the question of developing colovaginal fistula versus internal enhancement within the mass that is now decreased in size. Attention on follow-up is suggested. Correlate with any current signs of vaginal drainage. 3. LEFT thoracic inlet adenopathy compatible with involvement in the chest. Area not imaged on previous imaging.   02/18/2021 Imaging   1. Interval progression of left supraclavicular,, left thoracic inlet, left subpectoral, retroperitoneal, mesenteric and pelvic sidewall lymphadenopathy. Metastatic deposits in the central pelvis are also progressive. 2. Interval progression of left subpectoral lymphadenopathy with thrombus now visible in the left subclavian/innominate vein. 3.  Aortic Atherosclerosis (ICD10-I70.0).   02/23/2021 -  Chemotherapy   Patient is on Treatment Plan : UTERINE Lenvatinib + Pembrolizumab q21d       PHYSICAL EXAMINATION: ECOG PERFORMANCE STATUS: 1 - Symptomatic but completely ambulatory  Vitals:   03/16/21 1244  BP: (!) 154/93  Pulse: 89  Resp: 18  Temp: 97.7 F (36.5 C)  SpO2: 99%   Filed Weights   03/16/21 1244  Weight: 110 lb 6.4 oz (50.1 kg)    GENERAL:alert, no distress and comfortable SKIN: skin color, texture, turgor are normal, no rashes or significant lesions EYES: normal, Conjunctiva are pink and non-injected, sclera clear OROPHARYNX:no exudate, no erythema and lips, buccal mucosa, and tongue normal  NECK: supple, thyroid normal size, non-tender, without nodularity LYMPH:  no palpable lymphadenopathy in the cervical, axillary or inguinal LUNGS: clear to auscultation and percussion with normal breathing effort HEART: regular rate & rhythm and no murmurs and no lower extremity edema ABDOMEN:abdomen soft, non-tender and normal bowel sounds Musculoskeletal:no cyanosis of digits and no clubbing  NEURO: alert & oriented x 3 with fluent speech, no focal motor/sensory deficits  LABORATORY DATA:  I have  reviewed the data as listed    Component Value Date/Time   NA 134 (L) 03/16/2021 1159   K 4.4 03/16/2021 1159   CL 101 03/16/2021 1159   CO2 22 03/16/2021 1159   GLUCOSE 102 (H) 03/16/2021 1159   BUN 30 (H) 03/16/2021 1159   CREATININE 1.32 (H) 03/16/2021 1159   CALCIUM 9.8 03/16/2021 1159   PROT 8.1 03/16/2021 1159   ALBUMIN 3.6 03/16/2021 1159   AST 46 (H) 03/16/2021 1159   ALT 61 (H) 03/16/2021 1159   ALKPHOS 198 (H) 03/16/2021 1159   BILITOT 0.5 03/16/2021 1159   GFRNONAA 42 (L) 03/16/2021 1159    No results found for: SPEP, UPEP  Lab Results  Component Value Date   WBC 5.4 03/16/2021   NEUTROABS 3.5 03/16/2021   HGB 9.2 (L) 03/16/2021   HCT 29.1 (L) 03/16/2021   MCV 71.3 (L) 03/16/2021   PLT 229  03/16/2021      Chemistry      Component Value Date/Time   NA 134 (L) 03/16/2021 1159   K 4.4 03/16/2021 1159   CL 101 03/16/2021 1159   CO2 22 03/16/2021 1159   BUN 30 (H) 03/16/2021 1159   CREATININE 1.32 (H) 03/16/2021 1159      Component Value Date/Time   CALCIUM 9.8 03/16/2021 1159   ALKPHOS 198 (H) 03/16/2021 1159   AST 46 (H) 03/16/2021 1159   ALT 61 (H) 03/16/2021 1159   BILITOT 0.5 03/16/2021 1159

## 2021-03-16 NOTE — Assessment & Plan Note (Signed)
She has multifactorial anemia, combination of thalassemia and recent anemia chronic illness Her blood count has stabilized since we switch her treatment Monitor closely She denies recent bleeding

## 2021-03-16 NOTE — Assessment & Plan Note (Signed)
She tolerated anticoagulation therapy well.  We will continue the same 

## 2021-03-16 NOTE — Assessment & Plan Note (Signed)
She had fluctuation of her serum creatinine We will continue aggressive risk factor modification

## 2021-03-16 NOTE — Assessment & Plan Note (Signed)
She has minimum pain today She will continue to take pain medicine as needed

## 2021-03-17 LAB — T4: T4, Total: 8.9 ug/dL (ref 4.5–12.0)

## 2021-03-19 ENCOUNTER — Other Ambulatory Visit (HOSPITAL_COMMUNITY): Payer: Self-pay

## 2021-03-23 ENCOUNTER — Telehealth: Payer: Self-pay

## 2021-03-23 ENCOUNTER — Other Ambulatory Visit: Payer: Self-pay

## 2021-03-23 ENCOUNTER — Encounter: Payer: Self-pay | Admitting: Hematology and Oncology

## 2021-03-23 ENCOUNTER — Inpatient Hospital Stay: Payer: Medicare Other | Attending: Hematology and Oncology | Admitting: Hematology and Oncology

## 2021-03-23 DIAGNOSIS — C55 Malignant neoplasm of uterus, part unspecified: Secondary | ICD-10-CM | POA: Insufficient documentation

## 2021-03-23 DIAGNOSIS — Z7901 Long term (current) use of anticoagulants: Secondary | ICD-10-CM | POA: Insufficient documentation

## 2021-03-23 DIAGNOSIS — I10 Essential (primary) hypertension: Secondary | ICD-10-CM

## 2021-03-23 DIAGNOSIS — N183 Chronic kidney disease, stage 3 unspecified: Secondary | ICD-10-CM | POA: Insufficient documentation

## 2021-03-23 DIAGNOSIS — G893 Neoplasm related pain (acute) (chronic): Secondary | ICD-10-CM | POA: Diagnosis not present

## 2021-03-23 DIAGNOSIS — Z5112 Encounter for antineoplastic immunotherapy: Secondary | ICD-10-CM | POA: Insufficient documentation

## 2021-03-23 DIAGNOSIS — R635 Abnormal weight gain: Secondary | ICD-10-CM | POA: Insufficient documentation

## 2021-03-23 DIAGNOSIS — C549 Malignant neoplasm of corpus uteri, unspecified: Secondary | ICD-10-CM

## 2021-03-23 DIAGNOSIS — I129 Hypertensive chronic kidney disease with stage 1 through stage 4 chronic kidney disease, or unspecified chronic kidney disease: Secondary | ICD-10-CM | POA: Insufficient documentation

## 2021-03-23 DIAGNOSIS — Z79899 Other long term (current) drug therapy: Secondary | ICD-10-CM | POA: Insufficient documentation

## 2021-03-23 NOTE — Telephone Encounter (Signed)
Oral Oncology Patient Advocate Encounter  Met patient in lobby room to complete re-enrollment application for [employer] in an effort to reduce patient's out of pocket expense for Lenvima to $0.    Application completed and faxed to [phone].   [employer] patient assistance phone number for follow up is [phone].   This encounter will be updated until final determination.   [facility] Phone [phone] Fax [phone] 03/23/2021 10:10 AM

## 2021-03-23 NOTE — Assessment & Plan Note (Signed)
Her documented blood pressure is now within normal range She will continue current prescribed antihypertensives

## 2021-03-23 NOTE — Assessment & Plan Note (Signed)
She has minimum pain but sometimes pain is limiting her physical activity I encouraged the patient to take pain medicine as needed

## 2021-03-23 NOTE — Progress Notes (Signed)
HEMATOLOGY-ONCOLOGY ELECTRONIC VISIT PROGRESS NOTE  Patient Care Team: [facility] [place] as PCP - General (Rheumatology) [name], RN as Oncology Nurse Navigator (Oncology)  I connected with the patient via telephone conference and verified that I am speaking with the correct person using two identifiers. The patient's location is at home and I am providing care from the [facility] I discussed the limitations, risks, security and privacy concerns of performing an evaluation and management service by e-visits and the availability of in person appointments.  I also discussed with the patient that there may be a patient responsible charge related to this service. The patient expressed understanding and agreed to proceed.   ASSESSMENT & PLAN:  Uterine cancer ([place]) Since the addition of Cardura, her blood pressure is now normal Physically, she is doing very well with minimal pain I continue to encourage her to take pain medicine as needed She will continue her current treatment without changes  Essential hypertension Her documented blood pressure is now within normal range She will continue current prescribed antihypertensives  Cancer associated pain She has minimum pain but sometimes pain is limiting her physical activity I encouraged the patient to take pain medicine as needed  No orders of the defined types were placed in this encounter.   INTERVAL HISTORY: Please see below for problem oriented charting. The purpose of today's discussion is to follow-up on recent uncontrolled hypertension secondary to [facility] Since last time I saw her, she complained of fatigue She is still able to walk but sometimes her physical activity could be limited by pain She is not taking pain medicine consistently because of fear of addiction Her blood pressure control has improved Her documented blood pressure over the last week is as follows:  October 29,  morning blood  pressure 100/78, heart rate 70 Evening blood pressure 132/79, heart rate 84  October 30 Morning blood pressure 147/80, heart rate 75 Evening blood pressure 137/82, heart rate 73  October 31 Morning blood pressure 135/82, heart rate 79 Evening blood pressure 129/78, heart rate 77  November 1 Morning blood pressure 140/80, heart rate 74 Evening blood pressure 128/79, heart rate 71  November 2 Morning blood pressure 118/77, heart rate 76 Evening blood pressure 140/80, heart rate 76  November 3 Morning blood pressure 177, heart rate 77 Evening blood pressure 122/73, heart rate 89  SUMMARY OF ONCOLOGIC HISTORY: Oncology History Overview Note  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 Pelvic mass measuring 7.5 x 4.8 cm, contiguous with the sigmoid colon and uterus with loss of fat planes. Site of origin is unclear, may be ovarian, uterine, or colonic. Lack of contrast limits detailed assessment. 2. Above pelvic mass causes partial obstruction of the left ureter with mild left hydroureteronephrosis. 3. Multifocal adenopathy in the abdomen and pelvis, suspicious for metastatic disease, primarily in the mesentery and lower peritoneum and pelvis. Please note that detailed assessment of adenopathy is limited on this noncontrast exam. Recommend oncology referral.  4. Wall thickening of the sigmoid colon distal to the left pelvic mass with pericolonic edema, suspicious for colitis. There is a moderate to large amount of stool in the more proximal colon, suggesting pelvic mass may be causing delayed colonic transit. 5. Small to moderate hiatal hernia.   08/28/2020 - 09/04/2020 Hospital Admission   The patient has been complaining of reduced urine output, loss of appetite and changes in bowel habits She had abnormal imaging  study leading to hospitalization, colonoscopy, biopsy and subsequently chemotherapy   08/30/2020 Procedure   CT-guided  biopsy of enlarged left external iliac chain lymph node as above   08/30/2020 Pathology Results   SURGICAL PATHOLOGY  CASE: [id number]  PATIENT: Teresa Oliver  Surgical Pathology Report  Clinical History: Pelvic mass with metastatic lymph adenopathy ([facility])  FINAL MICROSCOPIC DIAGNOSIS:   A. LYMPH NODE, LEFT EXTERNAL ILIAC CHAIN, BIOPSY:  - Poorly differentiated carcinoma with necrosis.  - See comment.   COMMENT:  The carcinoma is characterized by diffuse sheets of malignant epithelioid cells with prominent nucleoli and frequent mitotic figures. Immunohistochemistry is positive with cytokeratin 7 and PAX 8 with weak  positivity with estrogen receptor and WT1.  P53 shows diffuse strong nuclear positivity.  The carcinoma is negative with cytokeratin 20, CDX2, GATA3, GCDFP, TTF-1, HepPar 1, arginase 1 and CD10.  The  morphology and immunophenotype are most consistent with a gynecologic primary including high-grade serous carcinoma.    08/31/2020 Procedure   Colonoscopy - Preparation of the colon was fair. - Likely malignant partially obstructing tumor in the sigmoid colon (appears extrinsic process). Biopsied. Tattooed. - Internal hemorrhoids. - Stool in the entire examined colon   09/01/2020 Initial Diagnosis   Uterine cancer ([place])   09/01/2020 Procedure   Successful placement of a power injectable Port-A-Cath via the right internal jugular vein. The catheter is ready for immediate use.   09/04/2020 - 12/29/2020 Chemotherapy    Patient is on Treatment Plan: UTERINE CARBOPLATIN AUC 6 / PACLITAXEL Q21D       09/07/2020 Cancer Staging   Staging form: Corpus Uteri - Carcinoma and Carcinosarcoma, AJCC 8th Edition - Clinical: FIGO Stage IVA (cT4, cN2a, cM0) - Signed by [name], MD on 09/07/2020    11/15/2020 Imaging   1. Marked interval response to therapy with decrease in size of the mass extending from the uterus to the colon that was seen on the previous examination. Also with  decreased size and or resolution of pelvic, mesenteric and retroperitoneal adenopathy seen on previous imaging. 2. Tethering of the colon with area that raise the question of developing colovaginal fistula versus internal enhancement within the mass that is now decreased in size. Attention on follow-up is suggested. Correlate with any current signs of vaginal drainage. 3. LEFT thoracic inlet adenopathy compatible with involvement in the chest. Area not imaged on previous imaging.   02/18/2021 Imaging   1. Interval progression of left supraclavicular,, left thoracic inlet, left subpectoral, retroperitoneal, mesenteric and pelvic sidewall lymphadenopathy. Metastatic deposits in the central pelvis are also progressive. 2. Interval progression of left subpectoral lymphadenopathy with thrombus now visible in the left subclavian/innominate vein. 3. Aortic Atherosclerosis (ICD10-I70.0).   02/23/2021 -  Chemotherapy   Patient is on Treatment Plan : UTERINE Lenvatinib + Pembrolizumab q21d       REVIEW OF SYSTEMS:   Constitutional: Denies fevers, chills or abnormal weight loss Eyes: Denies blurriness of vision Ears, nose, mouth, throat, and face: Denies mucositis or sore throat Respiratory: Denies cough, dyspnea or wheezes Cardiovascular: Denies palpitation, chest discomfort Gastrointestinal:  Denies nausea, heartburn or change in bowel habits Skin: Denies abnormal skin rashes Lymphatics: Denies new lymphadenopathy or easy bruising Neurological:Denies numbness, tingling or new weaknesses Behavioral/Psych: Mood is stable, no new changes  Extremities: No lower extremity edema All other systems were reviewed with the patient and are negative.  I have reviewed the past medical history, past surgical history, social history and family history with the patient and they are  unchanged from previous note.  ALLERGIES:  has No Known Allergies.  MEDICATIONS:  Current Outpatient Medications  Medication Sig  Dispense Refill   amLODipine (NORVASC) 10 MG tablet Take 1 tablet (10 mg total) by mouth daily. 90 tablet 2   atenolol (TENORMIN) 50 MG tablet TAKE 1 TABLET(50 MG) BY MOUTH DAILY 30 tablet 2   cholecalciferol (VITAMIN D3) 25 MCG (1000 UNIT) tablet Take 1,000 Units by mouth daily.     doxazosin (CARDURA) 4 MG tablet Take 1 tablet (4 mg total) by mouth daily. 30 tablet 3   lenvatinib 20 mg daily dose (LENVIMA) 2 x 10 MG capsule Take 1 capsule (10 mg total) by mouth daily. 30 capsule 11   lidocaine-prilocaine (EMLA) cream Apply 1 application topically daily as needed. 30 g 3   LORazepam (ATIVAN) 0.5 MG tablet TAKE 1 TABLET(0.5 MG) BY MOUTH TWICE DAILY AS NEEDED FOR ANXIETY 60 tablet 0   losartan (COZAAR) 50 MG tablet Take 1 tablet (50 mg total) by mouth daily. 30 tablet 3   MAGNESIUM-OXIDE 400 (240 Mg) MG tablet TAKE 1 TABLET(400 MG) BY MOUTH TWICE DAILY 60 tablet 1   ondansetron (ZOFRAN) 8 MG tablet Take 1 tablet (8 mg total) by mouth every 8 (eight) hours as needed for nausea. 30 tablet 3   oxybutynin (DITROPAN) 5 MG tablet Take 1 tablet (5 mg total) by mouth every 8 (eight) hours as needed for bladder spasms. 90 tablet 1   oxyCODONE (OXY IR/ROXICODONE) 5 MG immediate release tablet Take 1 tablet (5 mg total) by mouth every 4 (four) hours as needed for severe pain. 30 tablet 0   polyethylene glycol (MIRALAX / GLYCOLAX) 17 g packet Take 17 g by mouth 2 (two) times daily as needed for moderate constipation. 14 each 0   prochlorperazine (COMPAZINE) 10 MG tablet Take 1 tablet (10 mg total) by mouth every 6 (six) hours as needed for nausea or vomiting. 30 tablet 1   rivaroxaban (XARELTO) 20 MG TABS tablet Take 1 tablet (20 mg total) by mouth daily with supper. 30 tablet 3   senna-docusate (SENOKOT-S) 8.6-50 MG tablet Take 1 tablet by mouth 2 (two) times daily. 30 tablet 1   No current facility-administered medications for this visit.    PHYSICAL EXAMINATION: ECOG PERFORMANCE STATUS: 1 - Symptomatic  but completely ambulatory  LABORATORY DATA:  I have reviewed the data as listed CMP Latest Ref Rng & Units 03/16/2021 02/23/2021 02/16/2021  Glucose 70 - 99 mg/dL 102(H) 123(H) 104(H)  BUN 8 - 23 mg/dL 30(H) 33(H) 23  Creatinine 0.44 - 1.00 mg/dL 1.32(H) 1.54(H) 1.33(H)  Sodium 135 - 145 mmol/L 134(L) 133(L) 137  Potassium 3.5 - 5.1 mmol/L 4.4 4.5 4.5  Chloride 98 - 111 mmol/L 101 98 102  CO2 22 - 32 mmol/L _0 Calcium 8.9 - 10.3 mg/dL 9.8 9.8 10.1  Total Protein 6.5 - 8.1 g/dL 8.1 7.7 7.9  Total Bilirubin 0.3 - 1.2 mg/dL 0.5 0.4 0.3  Alkaline Phos 38 - 126 U/L 198(H) 133(H) 124  AST 15 - 41 U/L 46(H) 16 22  ALT 0 - 44 U/L 61(H) 15 14    Lab Results  Component Value Date   WBC 5.4 03/16/2021   HGB 9.2 (L) 03/16/2021   HCT 29.1 (L) 03/16/2021   MCV 71.3 (L) 03/16/2021   PLT 229 03/16/2021   NEUTROABS 3.5 03/16/2021    I discussed the assessment and treatment plan with the patient. The patient was provided an opportunity  to ask questions and all were answered. The patient agreed with the plan and demonstrated an understanding of the instructions. The patient was advised to call back or seek an in-person evaluation if the symptoms worsen or if the condition fails to improve as anticipated.    I spent 20 minutes for the appointment reviewing test results, discuss management and coordination of care.  [name], MD 03/23/2021 11:19 AM

## 2021-03-23 NOTE — Assessment & Plan Note (Signed)
Since the addition of Cardura, her blood pressure is now normal Physically, she is doing very well with minimal pain I continue to encourage her to take pain medicine as needed She will continue her current treatment without changes

## 2021-03-24 ENCOUNTER — Other Ambulatory Visit: Payer: Self-pay | Admitting: Hematology and Oncology

## 2021-03-26 ENCOUNTER — Other Ambulatory Visit: Payer: Self-pay | Admitting: Hematology and Oncology

## 2021-03-26 ENCOUNTER — Encounter: Payer: Self-pay | Admitting: Hematology and Oncology

## 2021-04-09 ENCOUNTER — Encounter: Payer: Self-pay | Admitting: Hematology and Oncology

## 2021-04-09 ENCOUNTER — Inpatient Hospital Stay (HOSPITAL_BASED_OUTPATIENT_CLINIC_OR_DEPARTMENT_OTHER): Payer: Medicare Other

## 2021-04-09 ENCOUNTER — Other Ambulatory Visit: Payer: Self-pay

## 2021-04-09 ENCOUNTER — Inpatient Hospital Stay: Payer: Medicare Other

## 2021-04-09 ENCOUNTER — Inpatient Hospital Stay: Payer: Medicare Other | Admitting: Hematology and Oncology

## 2021-04-09 DIAGNOSIS — N183 Chronic kidney disease, stage 3 unspecified: Secondary | ICD-10-CM | POA: Diagnosis not present

## 2021-04-09 DIAGNOSIS — I10 Essential (primary) hypertension: Secondary | ICD-10-CM

## 2021-04-09 DIAGNOSIS — Z5112 Encounter for antineoplastic immunotherapy: Secondary | ICD-10-CM | POA: Diagnosis not present

## 2021-04-09 DIAGNOSIS — C549 Malignant neoplasm of corpus uteri, unspecified: Secondary | ICD-10-CM

## 2021-04-09 DIAGNOSIS — C55 Malignant neoplasm of uterus, part unspecified: Secondary | ICD-10-CM | POA: Diagnosis present

## 2021-04-09 DIAGNOSIS — Z79899 Other long term (current) drug therapy: Secondary | ICD-10-CM | POA: Diagnosis not present

## 2021-04-09 DIAGNOSIS — R635 Abnormal weight gain: Secondary | ICD-10-CM | POA: Diagnosis not present

## 2021-04-09 DIAGNOSIS — Z7901 Long term (current) use of anticoagulants: Secondary | ICD-10-CM | POA: Diagnosis not present

## 2021-04-09 DIAGNOSIS — I129 Hypertensive chronic kidney disease with stage 1 through stage 4 chronic kidney disease, or unspecified chronic kidney disease: Secondary | ICD-10-CM | POA: Diagnosis not present

## 2021-04-09 LAB — CBC WITH DIFFERENTIAL (CANCER CENTER ONLY)
Abs Immature Granulocytes: 0.01 10*3/uL (ref 0.00–0.07)
Basophils Absolute: 0 10*3/uL (ref 0.0–0.1)
Basophils Relative: 1 %
Eosinophils Absolute: 0.2 10*3/uL (ref 0.0–0.5)
Eosinophils Relative: 4 %
HCT: 27.3 % — ABNORMAL LOW (ref 36.0–46.0)
Hemoglobin: 8.5 g/dL — ABNORMAL LOW (ref 12.0–15.0)
Immature Granulocytes: 0 %
Lymphocytes Relative: 26 %
Lymphs Abs: 1 10*3/uL (ref 0.7–4.0)
MCH: 22.6 pg — ABNORMAL LOW (ref 26.0–34.0)
MCHC: 31.1 g/dL (ref 30.0–36.0)
MCV: 72.6 fL — ABNORMAL LOW (ref 80.0–100.0)
Monocytes Absolute: 0.5 10*3/uL (ref 0.1–1.0)
Monocytes Relative: 12 %
Neutro Abs: 2.2 10*3/uL (ref 1.7–7.7)
Neutrophils Relative %: 57 %
Platelet Count: 170 10*3/uL (ref 150–400)
RBC: 3.76 MIL/uL — ABNORMAL LOW (ref 3.87–5.11)
RDW: 17.4 % — ABNORMAL HIGH (ref 11.5–15.5)
WBC Count: 3.9 10*3/uL — ABNORMAL LOW (ref 4.0–10.5)
nRBC: 0 % (ref 0.0–0.2)

## 2021-04-09 LAB — CMP (CANCER CENTER ONLY)
ALT: 43 U/L (ref 0–44)
AST: 35 U/L (ref 15–41)
Albumin: 3.7 g/dL (ref 3.5–5.0)
Alkaline Phosphatase: 120 U/L (ref 38–126)
Anion gap: 8 (ref 5–15)
BUN: 32 mg/dL — ABNORMAL HIGH (ref 8–23)
CO2: 22 mmol/L (ref 22–32)
Calcium: 9 mg/dL (ref 8.9–10.3)
Chloride: 107 mmol/L (ref 98–111)
Creatinine: 1.67 mg/dL — ABNORMAL HIGH (ref 0.44–1.00)
GFR, Estimated: 32 mL/min — ABNORMAL LOW (ref >60)
Glucose, Bld: 102 mg/dL — ABNORMAL HIGH (ref 70–99)
Potassium: 4.3 mmol/L (ref 3.5–5.1)
Sodium: 137 mmol/L (ref 135–145)
Total Bilirubin: 0.4 mg/dL (ref 0.3–1.2)
Total Protein: 7.4 g/dL (ref 6.5–8.1)

## 2021-04-09 LAB — TOTAL PROTEIN, URINE DIPSTICK: Protein, ur: 30 mg/dL — AB

## 2021-04-09 LAB — TSH: TSH: 2.949 u[IU]/mL (ref 0.308–3.960)

## 2021-04-09 MED ORDER — SODIUM CHLORIDE 0.9 % IV SOLN
200.0000 mg | Freq: Once | INTRAVENOUS | Status: AC
  Administered 2021-04-09: 200 mg via INTRAVENOUS
  Filled 2021-04-09: qty 8

## 2021-04-09 MED ORDER — HEPARIN SOD (PORK) LOCK FLUSH 100 UNIT/ML IV SOLN
500.0000 [IU] | Freq: Once | INTRAVENOUS | Status: AC | PRN
  Administered 2021-04-09: 500 [IU]

## 2021-04-09 MED ORDER — SODIUM CHLORIDE 0.9% FLUSH
10.0000 mL | Freq: Once | INTRAVENOUS | Status: AC
  Administered 2021-04-09: 10 mL

## 2021-04-09 MED ORDER — SODIUM CHLORIDE 0.9 % IV SOLN: Freq: Once | INTRAVENOUS | Status: AC

## 2021-04-09 MED ORDER — SODIUM CHLORIDE 0.9% FLUSH
10.0000 mL | INTRAVENOUS | Status: DC | PRN
  Administered 2021-04-09: 10 mL

## 2021-04-09 NOTE — Progress Notes (Signed)
Ok to proceed with elevated Scr per [name], MD

## 2021-04-09 NOTE — Assessment & Plan Note (Signed)
She had fluctuation of her serum creatinine We will continue aggressive risk factor modification and increase oral fluid intake

## 2021-04-09 NOTE — Patient Instructions (Signed)
[facility]ICAL [facility]COLOG[facility]   ?Discharge Instructions: ?Thank you for choosing [facility] to provide your oncology and hematology care.  ? ?If you have a lab appointment with the Cancer Center, please go directly to the Cancer Center and check in at the registration area. ?  ?Wear comfortable clothing and clothing appropriate for easy access to any Portacath or PICC line.  ? ?We strive to give you quality time with your provider. You may need to reschedule your appointment if you arrive late (15 or more minutes).  Arriving late affects you and other patients whose appointments are after yours.  Also, if you miss three or more appointments without notifying the office, you may be dismissed from the clinic at the provider?s discretion.    ?  ?For prescription refill requests, have your pharmacy contact our office and allow 72 hours for refills to be completed.   ? ?Today you received the following chemotherapy and/or immunotherapy agents: pembrolizumab    ?  ?To help prevent nausea and vomiting after your treatment, we encourage you to take your nausea medication as directed. ? ?BELOW ARE SYMPTOMS THAT SHOULD BE REPORTED IMMEDIATELY: ?*FEVER GREATER THAN 100.4 F (38 ?C) OR HIGHER ?*CHILLS OR SWEATING ?*NAUSEA AND VOMITING THAT IS NOT CONTROLLED WITH YOUR NAUSEA MEDICATION ?*UNUSUAL SHORTNESS OF BREATH ?*UNUSUAL BRUISING OR BLEEDING ?*URINARY PROBLEMS (pain or burning when urinating, or frequent urination) ?*BOWEL PROBLEMS (unusual diarrhea, constipation, pain near the anus) ?TENDERNESS IN MOUTH AND THROAT WITH OR WITHOUT PRESENCE OF ULCERS (sore throat, sores in mouth, or a toothache) ?UNUSUAL RASH, SWELLING OR PAIN  ?UNUSUAL VAGINAL DISCHARGE OR ITCHING  ? ?Items with * indicate a potential emergency and should be followed up as soon as possible or go to the Emergency Department if any problems should occur. ? ?Please show the CHEMOTHERAPY ALERT CARD or IMMUNOTHERAPY ALERT CARD at  check-in to the Emergency Department and triage nurse. ? ?Should you have questions after your visit or need to cancel or reschedule your appointment, please contact [facility]  Dept: [phone]  and follow the prompts.  Office hours are 8:00 a.m. to 4:30 p.m. Monday - Friday. Please note that voicemails left after 4:00 p.m. may not be returned until the following business day.  We are closed weekends and major holidays. You have access to a nurse at all times for urgent questions. Please call the main number to the clinic Dept: [phone] and follow the prompts. ? ? ?For any non-urgent questions, you may also contact your provider using MyChart. We now offer e-Visits for anyone 18 and older to request care online for non-urgent symptoms. For details visit [email or web address]. ?  ?Also download the MyChart app! Go to the app store, search "MyChart", open the app, select [facility], and log in with your MyChart username and password. ? ?Due to Covid, a mask is required upon entering the hospital/clinic. If you do not have a mask, one will be given to you upon arrival. For doctor visits, patients may have 1 support person aged 18 or older with them. For treatment visits, patients cannot have anyone with them due to current Covid guidelines and our immunocompromised population.  ? ?

## 2021-04-09 NOTE — Assessment & Plan Note (Signed)
Since the addition of Cardura, her blood pressure is now normal Physically, she is doing very well with minimal pain She is experiencing some deconditioning but overall I felt that she has positive response to treatment I continue to encourage her to take pain medicine as needed She will continue her current treatment without changes I plan to order CT imaging after cycle 4 of treatment

## 2021-04-09 NOTE — Progress Notes (Signed)
[name] PROGRESS NOTE  Patient Care Team: [facility] [place] as PCP - General (Rheumatology) Teresa Mink [name], RN as Oncology Nurse Navigator (Oncology)  ASSESSMENT & PLAN:  Uterine cancer [facility]) Since the addition of Cardura, her blood pressure is now normal Physically, she is doing very well with minimal pain She is experiencing some deconditioning but overall I felt that she has positive response to treatment I continue to encourage her to take pain medicine as needed She will continue her current treatment without changes I plan to order CT imaging after cycle 4 of treatment  CKD (chronic kidney disease), stage III ([place]) She had fluctuation of her serum creatinine We will continue aggressive risk factor modification and increase oral fluid intake  Essential hypertension Her blood pressure is elevated today but at home were within normal range She will continue her current antihypertensives  No orders of the defined types were placed in this encounter.   All questions were answered. The patient knows to call the clinic with any problems, questions or concerns. The total time spent in the appointment was 20 minutes encounter with patients including review of chart and various tests results, discussions about plan of care and coordination of care plan   [name], MD 04/09/2021 3:39 PM  INTERVAL HISTORY: Please see below for problem oriented charting. she returns for treatment follow-up on pembrolizumab and Lenvima She tolerated treatment very well She has virtually no pain and is not taking any pain medicine Her appetite is fair and she is gaining weight Her documented blood pressure from home were within normal range  REVIEW OF SYSTEMS:   Constitutional: Denies fevers, chills or abnormal weight loss Eyes: Denies blurriness of vision Ears, nose, mouth, throat, and face: Denies mucositis or sore throat Respiratory: Denies cough, dyspnea  or wheezes Cardiovascular: Denies palpitation, chest discomfort or lower extremity swelling Gastrointestinal:  Denies nausea, heartburn or change in bowel habits Skin: Denies abnormal skin rashes Lymphatics: Denies new lymphadenopathy or easy bruising Neurological:Denies numbness, tingling or new weaknesses Behavioral/Psych: Mood is stable, no new changes  All other systems were reviewed with the patient and are negative.  I have reviewed the past medical history, past surgical history, social history and family history with the patient and they are unchanged from previous note.  ALLERGIES:  has No Known Allergies.  MEDICATIONS:  Current Outpatient Medications  Medication Sig Dispense Refill   amLODipine (NORVASC) 10 MG tablet Take 1 tablet (10 mg total) by mouth daily. 90 tablet 2   atenolol (TENORMIN) 50 MG tablet TAKE 1 TABLET(50 MG) BY MOUTH DAILY 30 tablet 2   cholecalciferol (VITAMIN D3) 25 MCG (1000 UNIT) tablet Take 1,000 Units by mouth daily.     doxazosin (CARDURA) 4 MG tablet Take 1 tablet (4 mg total) by mouth daily. 30 tablet 3   lenvatinib 20 mg daily dose (LENVIMA) 2 x 10 MG capsule Take 1 capsule (10 mg total) by mouth daily. 30 capsule 11   lidocaine-prilocaine (EMLA) cream Apply 1 application topically daily as needed. 30 g 3   LORazepam (ATIVAN) 0.5 MG tablet TAKE 1 TABLET(0.5 MG) BY MOUTH TWICE DAILY AS NEEDED FOR ANXIETY 60 tablet 0   losartan (COZAAR) 25 MG tablet TAKE 1 TABLET(25 MG) BY MOUTH DAILY 30 tablet 3   losartan (COZAAR) 50 MG tablet Take 1 tablet (50 mg total) by mouth daily. 30 tablet 3   MAGNESIUM-OXIDE 400 (240 Mg) MG tablet TAKE 1 TABLET(400 MG) BY MOUTH TWICE DAILY  60 tablet 1   ondansetron (ZOFRAN) 8 MG tablet Take 1 tablet (8 mg total) by mouth every 8 (eight) hours as needed for nausea. 30 tablet 3   oxybutynin (DITROPAN) 5 MG tablet Take 1 tablet (5 mg total) by mouth every 8 (eight) hours as needed for bladder spasms. 90 tablet 1   oxyCODONE (OXY  IR/ROXICODONE) 5 MG immediate release tablet Take 1 tablet (5 mg total) by mouth every 4 (four) hours as needed for severe pain. 30 tablet 0   polyethylene glycol (MIRALAX / GLYCOLAX) 17 g packet Take 17 g by mouth 2 (two) times daily as needed for moderate constipation. 14 each 0   prochlorperazine (COMPAZINE) 10 MG tablet Take 1 tablet (10 mg total) by mouth every 6 (six) hours as needed for nausea or vomiting. 30 tablet 1   rivaroxaban (XARELTO) 20 MG TABS tablet Take 1 tablet (20 mg total) by mouth daily with supper. 30 tablet 3   senna-docusate (SENOKOT-S) 8.6-50 MG tablet Take 1 tablet by mouth 2 (two) times daily. 30 tablet 1   No current facility-administered medications for this visit.   Facility-Administered Medications Ordered in Other Visits  Medication Dose Route Frequency Provider Last Rate Last Admin   sodium chloride flush (NS) 0.9 % injection 10 mL  10 mL Intracatheter PRN Bertis Ruddy, Naomi Fitton, MD   10 mL at 04/09/21 1344    SUMMARY OF ONCOLOGIC HISTORY: Oncology History Overview Note  High grade serous MMR normal Her2 negative   Uterine cancer (HCC)  08/28/2020 Imaging   1. Pelvic mass measuring 7.5 x 4.8 cm, contiguous with the sigmoid colon and uterus with loss of fat planes. Site of origin is unclear, may be ovarian, uterine, or colonic. Lack of contrast limits detailed assessment. 2. Above pelvic mass causes partial obstruction of the left ureter with mild left hydroureteronephrosis. 3. Multifocal adenopathy in the abdomen and pelvis, suspicious for metastatic disease, primarily in the mesentery and lower peritoneum and pelvis. Please note that detailed assessment of adenopathy is limited on this noncontrast exam. Recommend oncology referral.  4. Wall thickening of the sigmoid colon distal to the left pelvic mass with pericolonic edema, suspicious for colitis. There is a moderate to large amount of stool in the more proximal colon, suggesting pelvic mass may be causing delayed  colonic transit. 5. Small to moderate hiatal hernia.   08/28/2020 - 09/04/2020 Hospital Admission   The patient has been complaining of reduced urine output, loss of appetite and changes in bowel habits She had abnormal imaging study leading to hospitalization, colonoscopy, biopsy and subsequently chemotherapy   08/30/2020 Procedure   CT-guided biopsy of enlarged left external iliac chain lymph node as above   08/30/2020 Pathology Results   SURGICAL PATHOLOGY  CASE: [id number]  PATIENT: Teresa Oliver  Surgical Pathology Report  Clinical History: Pelvic mass with metastatic lymph adenopathy (crm)  FINAL MICROSCOPIC DIAGNOSIS:   A. LYMPH NODE, LEFT EXTERNAL ILIAC CHAIN, BIOPSY:  - Poorly differentiated carcinoma with necrosis.  - See comment.   COMMENT:  The carcinoma is characterized by diffuse sheets of malignant epithelioid cells with prominent nucleoli and frequent mitotic figures. Immunohistochemistry is positive with cytokeratin 7 and PAX 8 with weak  positivity with estrogen receptor and WT1.  P53 shows diffuse strong nuclear positivity.  The carcinoma is negative with cytokeratin 20, CDX2, GATA3, GCDFP, TTF-1, HepPar 1, arginase 1 and CD10.  The  morphology and immunophenotype are most consistent with a gynecologic primary including high-grade serous carcinoma.  08/31/2020 Procedure   Colonoscopy - Preparation of the colon was fair. - Likely malignant partially obstructing tumor in the sigmoid colon (appears extrinsic process). Biopsied. Tattooed. - Internal hemorrhoids. - Stool in the entire examined colon   09/01/2020 Initial Diagnosis   Uterine cancer ([place])   09/01/2020 Procedure   Successful placement of a power injectable Port-A-Cath via the right internal jugular vein. The catheter is ready for immediate use.   09/04/2020 - 12/29/2020 Chemotherapy    Patient is on Treatment Plan: UTERINE CARBOPLATIN AUC 6 / PACLITAXEL Q21D       09/07/2020 Cancer Staging    Staging form: Corpus Uteri - Carcinoma and Carcinosarcoma, AJCC 8th Edition - Clinical: FIGO Stage IVA (cT4, cN2a, cM0) - Signed by [name], MD on 09/07/2020    11/15/2020 Imaging   1. Marked interval response to therapy with decrease in size of the mass extending from the uterus to the colon that was seen on the previous examination. Also with decreased size and or resolution of pelvic, mesenteric and retroperitoneal adenopathy seen on previous imaging. 2. Tethering of the colon with area that raise the question of developing colovaginal fistula versus internal enhancement within the mass that is now decreased in size. Attention on follow-up is suggested. Correlate with any current signs of vaginal drainage. 3. LEFT thoracic inlet adenopathy compatible with involvement in the chest. Area not imaged on previous imaging.   02/18/2021 Imaging   1. Interval progression of left supraclavicular,, left thoracic inlet, left subpectoral, retroperitoneal, mesenteric and pelvic sidewall lymphadenopathy. Metastatic deposits in the central pelvis are also progressive. 2. Interval progression of left subpectoral lymphadenopathy with thrombus now visible in the left subclavian/innominate vein. 3. Aortic Atherosclerosis (ICD10-I70.0).   02/23/2021 -  Chemotherapy   Patient is on Treatment Plan : UTERINE Lenvatinib + Pembrolizumab q21d       PHYSICAL EXAMINATION: ECOG PERFORMANCE STATUS: 1 - Symptomatic but completely ambulatory  Vitals:   04/09/21 1129  BP: (!) 156/80  Pulse: 78  Resp: 18  Temp: 98 F (36.7 C)  SpO2: 100%   Filed Weights   04/09/21 1129  Weight: 111 lb 3.2 oz (50.4 kg)    GENERAL:alert, no distress and comfortable SKIN: skin color, texture, turgor are normal, no rashes or significant lesions EYES: normal, Conjunctiva are pink and non-injected, sclera clear OROPHARYNX:no exudate, no erythema and lips, buccal mucosa, and tongue normal  NECK: supple, thyroid normal size,  non-tender, without nodularity LYMPH:  no palpable lymphadenopathy in the cervical, axillary or inguinal LUNGS: clear to auscultation and percussion with normal breathing effort HEART: regular rate & rhythm and no murmurs and no lower extremity edema ABDOMEN:abdomen soft, non-tender and normal bowel sounds Musculoskeletal:no cyanosis of digits and no clubbing  NEURO: alert & oriented x 3 with fluent speech, no focal motor/sensory deficits  LABORATORY DATA:  I have reviewed the data as listed    Component Value Date/Time   NA 137 04/09/2021 1112   K 4.3 04/09/2021 1112   CL 107 04/09/2021 1112   CO2 22 04/09/2021 1112   GLUCOSE 102 (H) 04/09/2021 1112   BUN 32 (H) 04/09/2021 1112   CREATININE 1.67 (H) 04/09/2021 1112   CALCIUM 9.0 04/09/2021 1112   PROT 7.4 04/09/2021 1112   ALBUMIN 3.7 04/09/2021 1112   AST 35 04/09/2021 1112   ALT 43 04/09/2021 1112   ALKPHOS 120 04/09/2021 1112   BILITOT 0.4 04/09/2021 1112   GFRNONAA 32 (L) 04/09/2021 1112    No results found for:  SPEP, UPEP  Lab Results  Component Value Date   WBC 3.9 (L) 04/09/2021   NEUTROABS 2.2 04/09/2021   HGB 8.5 (L) 04/09/2021   HCT 27.3 (L) 04/09/2021   MCV 72.6 (L) 04/09/2021   PLT 170 04/09/2021      Chemistry      Component Value Date/Time   NA 137 04/09/2021 1112   K 4.3 04/09/2021 1112   CL 107 04/09/2021 1112   CO2 22 04/09/2021 1112   BUN 32 (H) 04/09/2021 1112   CREATININE 1.67 (H) 04/09/2021 1112      Component Value Date/Time   CALCIUM 9.0 04/09/2021 1112   ALKPHOS 120 04/09/2021 1112   AST 35 04/09/2021 1112   ALT 43 04/09/2021 1112   BILITOT 0.4 04/09/2021 1112

## 2021-04-09 NOTE — Assessment & Plan Note (Signed)
Her blood pressure is elevated today but at home were within normal range She will continue her current antihypertensives

## 2021-04-10 LAB — T4: T4, Total: 8.7 ug/dL (ref 4.5–12.0)

## 2021-04-19 ENCOUNTER — Other Ambulatory Visit: Payer: Self-pay | Admitting: Hematology and Oncology

## 2021-04-20 ENCOUNTER — Other Ambulatory Visit (HOSPITAL_COMMUNITY): Payer: Self-pay

## 2021-04-25 ENCOUNTER — Other Ambulatory Visit: Payer: Self-pay | Admitting: Hematology and Oncology

## 2021-04-30 ENCOUNTER — Other Ambulatory Visit: Payer: Self-pay

## 2021-04-30 ENCOUNTER — Encounter: Payer: Self-pay | Admitting: Hematology and Oncology

## 2021-04-30 ENCOUNTER — Inpatient Hospital Stay: Payer: Medicare Other | Attending: Hematology and Oncology

## 2021-04-30 ENCOUNTER — Inpatient Hospital Stay (HOSPITAL_BASED_OUTPATIENT_CLINIC_OR_DEPARTMENT_OTHER): Payer: Medicare Other | Admitting: Hematology and Oncology

## 2021-04-30 ENCOUNTER — Other Ambulatory Visit: Payer: Self-pay | Admitting: Hematology and Oncology

## 2021-04-30 ENCOUNTER — Inpatient Hospital Stay: Payer: Medicare Other

## 2021-04-30 VITALS — BP 142/78 | HR 69 | Temp 97.4°F | Resp 18 | Ht 60.0 in | Wt 112.4 lb

## 2021-04-30 DIAGNOSIS — C778 Secondary and unspecified malignant neoplasm of lymph nodes of multiple regions: Secondary | ICD-10-CM | POA: Insufficient documentation

## 2021-04-30 DIAGNOSIS — I700 Atherosclerosis of aorta: Secondary | ICD-10-CM | POA: Diagnosis not present

## 2021-04-30 DIAGNOSIS — D539 Nutritional anemia, unspecified: Secondary | ICD-10-CM | POA: Diagnosis not present

## 2021-04-30 DIAGNOSIS — Z79899 Other long term (current) drug therapy: Secondary | ICD-10-CM | POA: Insufficient documentation

## 2021-04-30 DIAGNOSIS — I10 Essential (primary) hypertension: Secondary | ICD-10-CM

## 2021-04-30 DIAGNOSIS — I129 Hypertensive chronic kidney disease with stage 1 through stage 4 chronic kidney disease, or unspecified chronic kidney disease: Secondary | ICD-10-CM | POA: Diagnosis not present

## 2021-04-30 DIAGNOSIS — Z5112 Encounter for antineoplastic immunotherapy: Secondary | ICD-10-CM | POA: Insufficient documentation

## 2021-04-30 DIAGNOSIS — C549 Malignant neoplasm of corpus uteri, unspecified: Secondary | ICD-10-CM

## 2021-04-30 DIAGNOSIS — N183 Chronic kidney disease, stage 3 unspecified: Secondary | ICD-10-CM | POA: Diagnosis not present

## 2021-04-30 DIAGNOSIS — C55 Malignant neoplasm of uterus, part unspecified: Secondary | ICD-10-CM | POA: Diagnosis present

## 2021-04-30 LAB — CBC WITH DIFFERENTIAL (CANCER CENTER ONLY)
Abs Immature Granulocytes: 0.02 10*3/uL (ref 0.00–0.07)
Basophils Absolute: 0 10*3/uL (ref 0.0–0.1)
Basophils Relative: 1 %
Eosinophils Absolute: 0.3 10*3/uL (ref 0.0–0.5)
Eosinophils Relative: 6 %
HCT: 29.1 % — ABNORMAL LOW (ref 36.0–46.0)
Hemoglobin: 9.2 g/dL — ABNORMAL LOW (ref 12.0–15.0)
Immature Granulocytes: 1 %
Lymphocytes Relative: 20 %
Lymphs Abs: 0.9 10*3/uL (ref 0.7–4.0)
MCH: 22.5 pg — ABNORMAL LOW (ref 26.0–34.0)
MCHC: 31.6 g/dL (ref 30.0–36.0)
MCV: 71.3 fL — ABNORMAL LOW (ref 80.0–100.0)
Monocytes Absolute: 0.4 10*3/uL (ref 0.1–1.0)
Monocytes Relative: 9 %
Neutro Abs: 2.7 10*3/uL (ref 1.7–7.7)
Neutrophils Relative %: 63 %
Platelet Count: 204 10*3/uL (ref 150–400)
RBC: 4.08 MIL/uL (ref 3.87–5.11)
RDW: 16.8 % — ABNORMAL HIGH (ref 11.5–15.5)
WBC Count: 4.2 10*3/uL (ref 4.0–10.5)
nRBC: 0 % (ref 0.0–0.2)

## 2021-04-30 LAB — CMP (CANCER CENTER ONLY)
ALT: 35 U/L (ref 0–44)
AST: 29 U/L (ref 15–41)
Albumin: 3.7 g/dL (ref 3.5–5.0)
Alkaline Phosphatase: 118 U/L (ref 38–126)
Anion gap: 10 (ref 5–15)
BUN: 34 mg/dL — ABNORMAL HIGH (ref 8–23)
CO2: 22 mmol/L (ref 22–32)
Calcium: 9.1 mg/dL (ref 8.9–10.3)
Chloride: 106 mmol/L (ref 98–111)
Creatinine: 1.34 mg/dL — ABNORMAL HIGH (ref 0.44–1.00)
GFR, Estimated: 42 mL/min — ABNORMAL LOW (ref >60)
Glucose, Bld: 97 mg/dL (ref 70–99)
Potassium: 4.6 mmol/L (ref 3.5–5.1)
Sodium: 138 mmol/L (ref 135–145)
Total Bilirubin: 0.4 mg/dL (ref 0.3–1.2)
Total Protein: 7.3 g/dL (ref 6.5–8.1)

## 2021-04-30 LAB — TOTAL PROTEIN, URINE DIPSTICK: Protein, ur: 300 mg/dL — AB

## 2021-04-30 LAB — TSH: TSH: 2.775 u[IU]/mL (ref 0.308–3.960)

## 2021-04-30 MED ORDER — SODIUM CHLORIDE 0.9% FLUSH
10.0000 mL | INTRAVENOUS | Status: DC | PRN
  Administered 2021-04-30: 10 mL

## 2021-04-30 MED ORDER — SODIUM CHLORIDE 0.9 % IV SOLN
200.0000 mg | Freq: Once | INTRAVENOUS | Status: AC
  Administered 2021-04-30: 200 mg via INTRAVENOUS
  Filled 2021-04-30: qty 8

## 2021-04-30 MED ORDER — HYDRALAZINE HCL 25 MG PO TABS
25.0000 mg | ORAL_TABLET | Freq: Three times a day (TID) | ORAL | 1 refills | Status: DC
Start: 2021-04-30 — End: 2021-05-25

## 2021-04-30 MED ORDER — SODIUM CHLORIDE 0.9% FLUSH
10.0000 mL | Freq: Once | INTRAVENOUS | Status: AC
  Administered 2021-04-30: 10 mL

## 2021-04-30 MED ORDER — HEPARIN SOD (PORK) LOCK FLUSH 100 UNIT/ML IV SOLN
500.0000 [IU] | Freq: Once | INTRAVENOUS | Status: AC | PRN
  Administered 2021-04-30: 500 [IU]

## 2021-04-30 MED ORDER — SODIUM CHLORIDE 0.9 % IV SOLN: Freq: Once | INTRAVENOUS | Status: AC

## 2021-04-30 NOTE — Assessment & Plan Note (Signed)
Her treatment course is complicated by having proteinuria due to uncontrolled hypertension She will proceed with pembrolizumab today and I plan to hold Lenvima for 1 week and to add additional blood pressure medication to control her blood pressure better We will set up virtual visit next week I will order CT imaging next month for objective assessment of response to therapy

## 2021-04-30 NOTE — Assessment & Plan Note (Signed)
She has intermittent worsening renal function, exacerbated by uncontrolled hypertension I plan to order CT imaging next month without IV contrast

## 2021-04-30 NOTE — Assessment & Plan Note (Signed)
She has heavy proteinuria despite being on multiple antihypertensives Due to heavy proteinuria, I recommend she hold lenvatinib for 1 week I recommend the patient to continue checking her blood pressure twice daily I will add additional hydralazine

## 2021-04-30 NOTE — Assessment & Plan Note (Signed)
She has multifactorial anemia, combination of thalassemia and recent anemia chronic illness Her blood count has stabilized since we switch her treatment Monitor closely She denies recent bleeding

## 2021-04-30 NOTE — Patient Instructions (Signed)
[facility]ICAL [facility]COLOG[facility]  Discharge Instructions: Thank you for choosing [facility] to provide your oncology and hematology care.   If you have a lab appointment with the Cancer Center, please go directly to the Cancer Center and check in at the registration area.   Wear comfortable clothing and clothing appropriate for easy access to any Portacath or PICC line.   We strive to give you quality time with your provider. You may need to reschedule your appointment if you arrive late (15 or more minutes).  Arriving late affects you and other patients whose appointments are after yours.  Also, if you miss three or more appointments without notifying the office, you may be dismissed from the clinic at the provider's discretion.      For prescription refill requests, have your pharmacy contact our office and allow 72 hours for refills to be completed.    Today you received the following chemotherapy and/or immunotherapy agents: keytruda      To help prevent nausea and vomiting after your treatment, we encourage you to take your nausea medication as directed.  BELOW ARE SYMPTOMS THAT SHOULD BE REPORTED IMMEDIATELY: *FEVER GREATER THAN 100.4 F (38 C) OR HIGHER *CHILLS OR SWEATING *NAUSEA AND VOMITING THAT IS NOT CONTROLLED WITH YOUR NAUSEA MEDICATION *UNUSUAL SHORTNESS OF BREATH *UNUSUAL BRUISING OR BLEEDING *URINARY PROBLEMS (pain or burning when urinating, or frequent urination) *BOWEL PROBLEMS (unusual diarrhea, constipation, pain near the anus) TENDERNESS IN MOUTH AND THROAT WITH OR WITHOUT PRESENCE OF ULCERS (sore throat, sores in mouth, or a toothache) UNUSUAL RASH, SWELLING OR PAIN  UNUSUAL VAGINAL DISCHARGE OR ITCHING   Items with * indicate a potential emergency and should be followed up as soon as possible or go to the Emergency Department if any problems should occur.  Please show the CHEMOTHERAPY ALERT CARD or IMMUNOTHERAPY ALERT CARD at check-in to  the Emergency Department and triage nurse.  Should you have questions after your visit or need to cancel or reschedule your appointment, please contact [facility]  Dept: [phone]  and follow the prompts.  Office hours are 8:00 a.m. to 4:30 p.m. Monday - Friday. Please note that voicemails left after 4:00 p.m. may not be returned until the following business day.  We are closed weekends and major holidays. You have access to a nurse at all times for urgent questions. Please call the main number to the clinic Dept: [phone] and follow the prompts.   For any non-urgent questions, you may also contact your provider using MyChart. We now offer e-Visits for anyone 18 and older to request care online for non-urgent symptoms. For details visit [email or web address].   Also download the MyChart app! Go to the app store, search "MyChart", open the app, select , and log in with your MyChart username and password.  Due to Covid, a mask is required upon entering the hospital/clinic. If you do not have a mask, one will be given to you upon arrival. For doctor visits, patients may have 1 support person aged 18 or older with them. For treatment visits, patients cannot have anyone with them due to current Covid guidelines and our immunocompromised population.   

## 2021-04-30 NOTE — Progress Notes (Signed)
[place] OFFICE PROGRESS NOTE  Patient Care Team: [facility], [place] as PCP - General (Rheumatology) [name], [name], RN as Oncology Nurse Navigator (Oncology)  ASSESSMENT & PLAN:  Uterine cancer [facility]) Her treatment course is complicated by having proteinuria due to uncontrolled hypertension She will proceed with pembrolizumab today and I plan to hold Lenvima for 1 week and to add additional blood pressure medication to control her blood pressure better We will set up virtual visit next week I will order CT imaging next month for objective assessment of response to therapy  Essential hypertension She has heavy proteinuria despite being on multiple antihypertensives Due to heavy proteinuria, I recommend she hold lenvatinib for 1 week I recommend the patient to continue checking her blood pressure twice daily I will add additional hydralazine   CKD (chronic kidney disease), stage III ([place]) She has intermittent worsening renal function, exacerbated by uncontrolled hypertension I plan to order CT imaging next month without IV contrast  Deficiency anemia She has multifactorial anemia, combination of thalassemia and recent anemia chronic illness Her blood count has stabilized since we switch her treatment Monitor closely She denies recent bleeding  Orders Placed This Encounter  Procedures   CT CHEST WO CONTRAST    Standing Status:   Future    Standing Expiration Date:   04/30/2022    Order Specific Question:   Preferred imaging location?    Answer:   [facility]    Order Specific Question:   Radiology Contrast Protocol - do NOT remove file path    Answer:   [email or web address]   CT Abdomen Pelvis Wo Contrast    Standing Status:   Future    Standing Expiration Date:   04/30/2022    Order Specific Question:   Preferred imaging location?    Answer:   [facility]    Order Specific Question:   Is Oral  Contrast requested for this exam?    Answer:   Yes, Per Radiology protocol    All questions were answered. The patient knows to call the clinic with any problems, questions or concerns. The total time spent in the appointment was 40 minutes encounter with patients including review of chart and various tests results, discussions about plan of care and coordination of care plan   [name], MD 04/30/2021 3:02 PM  INTERVAL HISTORY: Please see below for problem oriented charting. she returns for treatment follow-up on pembrolizumab and Lenvima for recurrent uterine cancer Since last time I saw her, she is not symptomatic Denies pain, abdominal bloating or changes in bowel habits She brought home documented blood pressure over the last few days Her blood pressure ranges from 111/67-100 51/93 Her heart rate usually is in the 60s except 1 episode where her heart rate has dropped pretty low She has not been symptomatic from her hypertension  REVIEW OF SYSTEMS:   Constitutional: Denies fevers, chills or abnormal weight loss Eyes: Denies blurriness of vision Ears, nose, mouth, throat, and face: Denies mucositis or sore throat Respiratory: Denies cough, dyspnea or wheezes Cardiovascular: Denies palpitation, chest discomfort or lower extremity swelling Gastrointestinal:  Denies nausea, heartburn or change in bowel habits Skin: Denies abnormal skin rashes Lymphatics: Denies new lymphadenopathy or easy bruising Neurological:Denies numbness, tingling or new weaknesses Behavioral/Psych: Mood is stable, no new changes  All other systems were reviewed with the patient and are negative.  I have reviewed the past medical history, past surgical history, social history and family history with the  patient and they are unchanged from previous note.  ALLERGIES:  has No Known Allergies.  MEDICATIONS:  Current Outpatient Medications  Medication Sig Dispense Refill   hydrALAZINE (APRESOLINE) 25 MG  tablet Take 1 tablet (25 mg total) by mouth 3 (three) times daily. 90 tablet 1   amLODipine (NORVASC) 10 MG tablet Take 1 tablet (10 mg total) by mouth daily. 90 tablet 2   atenolol (TENORMIN) 50 MG tablet TAKE 1 TABLET(50 MG) BY MOUTH DAILY 30 tablet 2   cholecalciferol (VITAMIN D3) 25 MCG (1000 UNIT) tablet Take 1,000 Units by mouth daily.     doxazosin (CARDURA) 4 MG tablet Take 1 tablet (4 mg total) by mouth daily. 30 tablet 3   lenvatinib 20 mg daily dose (LENVIMA) 2 x 10 MG capsule Take 1 capsule (10 mg total) by mouth daily. 30 capsule 11   lidocaine-prilocaine (EMLA) cream Apply 1 application topically daily as needed. 30 g 3   LORazepam (ATIVAN) 0.5 MG tablet TAKE 1 TABLET(0.5 MG) BY MOUTH TWICE DAILY AS NEEDED FOR ANXIETY 60 tablet 0   losartan (COZAAR) 25 MG tablet TAKE 1 TABLET(25 MG) BY MOUTH DAILY 30 tablet 3   losartan (COZAAR) 50 MG tablet Take 1 tablet (50 mg total) by mouth daily. 30 tablet 3   MAGNESIUM-OXIDE 400 (240 Mg) MG tablet TAKE 1 TABLET(400 MG) BY MOUTH TWICE DAILY 60 tablet 1   ondansetron (ZOFRAN) 8 MG tablet Take 1 tablet (8 mg total) by mouth every 8 (eight) hours as needed for nausea. 30 tablet 3   oxybutynin (DITROPAN) 5 MG tablet Take 1 tablet (5 mg total) by mouth every 8 (eight) hours as needed for bladder spasms. 90 tablet 1   oxyCODONE (OXY IR/ROXICODONE) 5 MG immediate release tablet Take 1 tablet (5 mg total) by mouth every 4 (four) hours as needed for severe pain. 30 tablet 0   polyethylene glycol (MIRALAX / GLYCOLAX) 17 g packet Take 17 g by mouth 2 (two) times daily as needed for moderate constipation. 14 each 0   prochlorperazine (COMPAZINE) 10 MG tablet Take 1 tablet (10 mg total) by mouth every 6 (six) hours as needed for nausea or vomiting. 30 tablet 1   rivaroxaban (XARELTO) 20 MG TABS tablet Take 1 tablet (20 mg total) by mouth daily with supper. 30 tablet 3   senna-docusate (SENOKOT-S) 8.6-50 MG tablet Take 1 tablet by mouth 2 (two) times daily. 30  tablet 1   No current facility-administered medications for this visit.   Facility-Administered Medications Ordered in Other Visits  Medication Dose Route Frequency Provider Last Rate Last Admin   sodium chloride flush (NS) 0.9 % injection 10 mL  10 mL Intracatheter PRN Alvy Bimler, Jerami Tammen, MD   10 mL at 04/30/21 1346    SUMMARY OF ONCOLOGIC HISTORY: Oncology History Overview Note  High grade serous MMR normal Her2 negative   Uterine cancer ([place])  08/28/2020 Imaging   1. Pelvic mass measuring 7.5 x 4.8 cm, contiguous with the sigmoid colon and uterus with loss of fat planes. Site of origin is unclear, may be ovarian, uterine, or colonic. Lack of contrast limits detailed assessment. 2. Above pelvic mass causes partial obstruction of the left ureter with mild left hydroureteronephrosis. 3. Multifocal adenopathy in the abdomen and pelvis, suspicious for metastatic disease, primarily in the mesentery and lower peritoneum and pelvis. Please note that detailed assessment of adenopathy is limited on this noncontrast exam. Recommend oncology referral.  4. Wall thickening of the sigmoid colon distal to  the left pelvic mass with pericolonic edema, suspicious for colitis. There is a moderate to large amount of stool in the more proximal colon, suggesting pelvic mass may be causing delayed colonic transit. 5. Small to moderate hiatal hernia.   08/28/2020 - 09/04/2020 Hospital Admission   The patient has been complaining of reduced urine output, loss of appetite and changes in bowel habits She had abnormal imaging study leading to hospitalization, colonoscopy, biopsy and subsequently chemotherapy   08/30/2020 Procedure   CT-guided biopsy of enlarged left external iliac chain lymph node as above   08/30/2020 Pathology Results   SURGICAL PATHOLOGY  CASE: [id number]  PATIENT: Jazminn Goldsmith  Surgical Pathology Report  Clinical History: Pelvic mass with metastatic lymph adenopathy (crm)  FINAL MICROSCOPIC  DIAGNOSIS:   A. LYMPH NODE, LEFT EXTERNAL ILIAC CHAIN, BIOPSY:  - Poorly differentiated carcinoma with necrosis.  - See comment.   COMMENT:  The carcinoma is characterized by diffuse sheets of malignant epithelioid cells with prominent nucleoli and frequent mitotic figures. Immunohistochemistry is positive with cytokeratin 7 and PAX 8 with weak  positivity with estrogen receptor and WT1.  P53 shows diffuse strong nuclear positivity.  The carcinoma is negative with cytokeratin 20, CDX2, GATA3, GCDFP, TTF-1, HepPar 1, arginase 1 and CD10.  The  morphology and immunophenotype are most consistent with a gynecologic primary including high-grade serous carcinoma.    08/31/2020 Procedure   Colonoscopy - Preparation of the colon was fair. - Likely malignant partially obstructing tumor in the sigmoid colon (appears extrinsic process). Biopsied. Tattooed. - Internal hemorrhoids. - Stool in the entire examined colon   09/01/2020 Initial Diagnosis   Uterine cancer ([place])   09/01/2020 Procedure   Successful placement of a power injectable Port-A-Cath via the right internal jugular vein. The catheter is ready for immediate use.   09/04/2020 - 12/29/2020 Chemotherapy    Patient is on Treatment Plan: UTERINE CARBOPLATIN AUC 6 / PACLITAXEL Q21D       09/07/2020 Cancer Staging   Staging form: Corpus Uteri - Carcinoma and Carcinosarcoma, AJCC 8th Edition - Clinical: FIGO Stage IVA (cT4, cN2a, cM0) - Signed by [name], MD on 09/07/2020    11/15/2020 Imaging   1. Marked interval response to therapy with decrease in size of the mass extending from the uterus to the colon that was seen on the previous examination. Also with decreased size and or resolution of pelvic, mesenteric and retroperitoneal adenopathy seen on previous imaging. 2. Tethering of the colon with area that raise the question of developing colovaginal fistula versus internal enhancement within the mass that is now decreased in size.  Attention on follow-up is suggested. Correlate with any current signs of vaginal drainage. 3. LEFT thoracic inlet adenopathy compatible with involvement in the chest. Area not imaged on previous imaging.   02/18/2021 Imaging   1. Interval progression of left supraclavicular,, left thoracic inlet, left subpectoral, retroperitoneal, mesenteric and pelvic sidewall lymphadenopathy. Metastatic deposits in the central pelvis are also progressive. 2. Interval progression of left subpectoral lymphadenopathy with thrombus now visible in the left subclavian/innominate vein. 3. Aortic Atherosclerosis (ICD10-I70.0).   02/23/2021 -  Chemotherapy   Patient is on Treatment Plan : UTERINE Lenvatinib + Pembrolizumab q21d       PHYSICAL EXAMINATION: ECOG PERFORMANCE STATUS: 1 - Symptomatic but completely ambulatory  Vitals:   04/30/21 1120  BP: (!) 142/78  Pulse: 69  Resp: 18  Temp: (!) 97.4 F (36.3 C)  SpO2: 100%   Filed Weights   04/30/21 1120  Weight: 112 lb 6.4 oz (51 kg)    GENERAL:alert, no distress and comfortable NEURO: alert & oriented x 3 with fluent speech, no focal motor/sensory deficits  LABORATORY DATA:  I have reviewed the data as listed    Component Value Date/Time   NA 138 04/30/2021 1107   K 4.6 04/30/2021 1107   CL 106 04/30/2021 1107   CO2 22 04/30/2021 1107   GLUCOSE 97 04/30/2021 1107   BUN 34 (H) 04/30/2021 1107   CREATININE 1.34 (H) 04/30/2021 1107   CALCIUM 9.1 04/30/2021 1107   PROT 7.3 04/30/2021 1107   ALBUMIN 3.7 04/30/2021 1107   AST 29 04/30/2021 1107   ALT 35 04/30/2021 1107   ALKPHOS 118 04/30/2021 1107   BILITOT 0.4 04/30/2021 1107   GFRNONAA 42 (L) 04/30/2021 1107    No results found for: SPEP, UPEP  Lab Results  Component Value Date   WBC 4.2 04/30/2021   NEUTROABS 2.7 04/30/2021   HGB 9.2 (L) 04/30/2021   HCT 29.1 (L) 04/30/2021   MCV 71.3 (L) 04/30/2021   PLT 204 04/30/2021      Chemistry      Component Value Date/Time   NA 138  04/30/2021 1107   K 4.6 04/30/2021 1107   CL 106 04/30/2021 1107   CO2 22 04/30/2021 1107   BUN 34 (H) 04/30/2021 1107   CREATININE 1.34 (H) 04/30/2021 1107      Component Value Date/Time   CALCIUM 9.1 04/30/2021 1107   ALKPHOS 118 04/30/2021 1107   AST 29 04/30/2021 1107   ALT 35 04/30/2021 1107   BILITOT 0.4 04/30/2021 1107

## 2021-05-01 LAB — T4: T4, Total: 7.5 ug/dL (ref 4.5–12.0)

## 2021-05-07 ENCOUNTER — Telehealth: Payer: Self-pay

## 2021-05-07 ENCOUNTER — Other Ambulatory Visit (HOSPITAL_COMMUNITY): Payer: Self-pay

## 2021-05-07 ENCOUNTER — Other Ambulatory Visit: Payer: Self-pay | Admitting: Hematology and Oncology

## 2021-05-07 ENCOUNTER — Other Ambulatory Visit: Payer: Self-pay

## 2021-05-07 MED ORDER — NIRMATRELVIR/RITONAVIR (PAXLOVID)TABLET
3.0000 | ORAL_TABLET | Freq: Two times a day (BID) | ORAL | 0 refills | Status: DC
  Filled 2021-05-07: qty 30, 5d supply, fill #0

## 2021-05-07 MED ORDER — MOLNUPIRAVIR EUA 200MG CAPSULE
4.0000 | ORAL_CAPSULE | Freq: Two times a day (BID) | ORAL | 0 refills | Status: AC
  Filled 2021-05-07: qty 40, 5d supply, fill #0

## 2021-05-07 NOTE — Telephone Encounter (Signed)
She called and left a message. She tested positive for covid with a home test yesterday. She is complaining of a low grade fever and come dizziness. She will take tylenol as needed and push fluids today.  She is asking for antiviral and ask that you send it to [facility] outpatient pharmacy. She already scheduled for virtual visit tomorrow with you.

## 2021-05-07 NOTE — Telephone Encounter (Signed)
Called and given below message. She verbalized understanding. 

## 2021-05-07 NOTE — Telephone Encounter (Signed)
I sent Paxlovid to [facility] by mistake, switched to [facility] instead Keep virtual visit tomorrow

## 2021-05-08 ENCOUNTER — Other Ambulatory Visit: Payer: Self-pay

## 2021-05-08 ENCOUNTER — Inpatient Hospital Stay (HOSPITAL_BASED_OUTPATIENT_CLINIC_OR_DEPARTMENT_OTHER): Payer: Medicare Other | Admitting: Hematology and Oncology

## 2021-05-08 ENCOUNTER — Encounter: Payer: Self-pay | Admitting: Hematology and Oncology

## 2021-05-08 ENCOUNTER — Other Ambulatory Visit: Payer: Self-pay | Admitting: Hematology and Oncology

## 2021-05-08 DIAGNOSIS — I10 Essential (primary) hypertension: Secondary | ICD-10-CM

## 2021-05-08 DIAGNOSIS — C549 Malignant neoplasm of corpus uteri, unspecified: Secondary | ICD-10-CM

## 2021-05-08 NOTE — Assessment & Plan Note (Signed)
She had history of uncontrolled hypertension but now her blood pressure is low likely due to infection I recommend the patient to hold hydralazine and continue close monitoring of blood pressure

## 2021-05-08 NOTE — Assessment & Plan Note (Signed)
I recommend the patient to hold Lenvima due to recent diagnosis of COVID-19 infection She will continue prescribed antiviral treatment She will resume next week when she is better

## 2021-05-08 NOTE — Progress Notes (Signed)
HEMATOLOGY-ONCOLOGY ELECTRONIC VISIT PROGRESS NOTE  Patient Care Team: [facility], [place] as PCP - General (Rheumatology) [name], RN as Oncology Nurse Navigator (Oncology)  I connected with the patient via telephone conference and verified that I am speaking with the correct person using two identifiers. The patient's location is at home and I am providing care from the [facility] I discussed the limitations, risks, security and privacy concerns of performing an evaluation and management service by e-visits and the availability of in person appointments.  I also discussed with the patient that there may be a patient responsible charge related to this service. The patient expressed understanding and agreed to proceed.   ASSESSMENT & PLAN:  Uterine cancer ([place]) I recommend the patient to hold Lenvima due to recent diagnosis of COVID-19 infection She will continue prescribed antiviral treatment She will resume next week when she is better  Essential hypertension She had history of uncontrolled hypertension but now her blood pressure is low likely due to infection I recommend the patient to hold hydralazine and continue close monitoring of blood pressure  No orders of the defined types were placed in this encounter.   INTERVAL HISTORY: Please see below for problem oriented charting. The purpose of today's discussion is to review medications and side effects She was diagnosed with COVID-19 infection and is started on antiviral treatment She denies significant fever or chills Her blood pressure is now running low Yesterday, her blood pressure was 101/50 1 in the morning and 136/77 in the evening This morning, her blood pressure is 107/65 and heart rate is 76 Her appetite is fair She denies pain  SUMMARY OF ONCOLOGIC HISTORY: Oncology History Overview Note  High grade serous MMR normal Her2 negative   Uterine cancer ([place])  08/28/2020 Imaging   1. Pelvic  mass measuring 7.5 x 4.8 cm, contiguous with the sigmoid colon and uterus with loss of fat planes. Site of origin is unclear, may be ovarian, uterine, or colonic. Lack of contrast limits detailed assessment. 2. Above pelvic mass causes partial obstruction of the left ureter with mild left hydroureteronephrosis. 3. Multifocal adenopathy in the abdomen and pelvis, suspicious for metastatic disease, primarily in the mesentery and lower peritoneum and pelvis. Please note that detailed assessment of adenopathy is limited on this noncontrast exam. Recommend oncology referral.  4. Wall thickening of the sigmoid colon distal to the left pelvic mass with pericolonic edema, suspicious for colitis. There is a moderate to large amount of stool in the more proximal colon, suggesting pelvic mass may be causing delayed colonic transit. 5. Small to moderate hiatal hernia.   08/28/2020 - 09/04/2020 Hospital Admission   The patient has been complaining of reduced urine output, loss of appetite and changes in bowel habits She had abnormal imaging study leading to hospitalization, colonoscopy, biopsy and subsequently chemotherapy   08/30/2020 Procedure   CT-guided biopsy of enlarged left external iliac chain lymph node as above   08/30/2020 Pathology Results   SURGICAL PATHOLOGY  CASE: [id number]  PATIENT: Teresa Oliver  Surgical Pathology Report  Clinical History: Pelvic mass with metastatic lymph adenopathy (crm)  FINAL MICROSCOPIC DIAGNOSIS:   A. LYMPH NODE, LEFT EXTERNAL ILIAC CHAIN, BIOPSY:  - Poorly differentiated carcinoma with necrosis.  - See comment.   COMMENT:  The carcinoma is characterized by diffuse sheets of malignant epithelioid cells with prominent nucleoli and frequent mitotic figures. Immunohistochemistry is positive with cytokeratin 7 and PAX 8 with weak  positivity with estrogen receptor and WT1.  P53 shows diffuse strong nuclear positivity.  The carcinoma is negative with cytokeratin 20,  CDX2, GATA3, GCDFP, TTF-1, HepPar 1, arginase 1 and CD10.  The  morphology and immunophenotype are most consistent with a gynecologic primary including high-grade serous carcinoma.    08/31/2020 Procedure   Colonoscopy - Preparation of the colon was fair. - Likely malignant partially obstructing tumor in the sigmoid colon (appears extrinsic process). Biopsied. Tattooed. - Internal hemorrhoids. - Stool in the entire examined colon   09/01/2020 Initial Diagnosis   Uterine cancer ([place])   09/01/2020 Procedure   Successful placement of a power injectable Port-A-Cath via the right internal jugular vein. The catheter is ready for immediate use.   09/04/2020 - 12/29/2020 Chemotherapy    Patient is on Treatment Plan: UTERINE CARBOPLATIN AUC 6 / PACLITAXEL Q21D       09/07/2020 Cancer Staging   Staging form: Corpus Uteri - Carcinoma and Carcinosarcoma, AJCC 8th Edition - Clinical: FIGO Stage IVA (cT4, cN2a, cM0) - Signed by [name], MD on 09/07/2020    11/15/2020 Imaging   1. Marked interval response to therapy with decrease in size of the mass extending from the uterus to the colon that was seen on the previous examination. Also with decreased size and or resolution of pelvic, mesenteric and retroperitoneal adenopathy seen on previous imaging. 2. Tethering of the colon with area that raise the question of developing colovaginal fistula versus internal enhancement within the mass that is now decreased in size. Attention on follow-up is suggested. Correlate with any current signs of vaginal drainage. 3. LEFT thoracic inlet adenopathy compatible with involvement in the chest. Area not imaged on previous imaging.   02/18/2021 Imaging   1. Interval progression of left supraclavicular,, left thoracic inlet, left subpectoral, retroperitoneal, mesenteric and pelvic sidewall lymphadenopathy. Metastatic deposits in the central pelvis are also progressive. 2. Interval progression of left subpectoral  lymphadenopathy with thrombus now visible in the left subclavian/innominate vein. 3. Aortic Atherosclerosis (ICD10-I70.0).   02/23/2021 -  Chemotherapy   Patient is on Treatment Plan : UTERINE Lenvatinib + Pembrolizumab q21d       REVIEW OF SYSTEMS:   Constitutional: Denies fevers, chills or abnormal weight loss Eyes: Denies blurriness of vision Ears, nose, mouth, throat, and face: Denies mucositis or sore throat Respiratory: Denies cough, dyspnea or wheezes Cardiovascular: Denies palpitation, chest discomfort Gastrointestinal:  Denies nausea, heartburn or change in bowel habits Skin: Denies abnormal skin rashes Lymphatics: Denies new lymphadenopathy or easy bruising Neurological:Denies numbness, tingling or new weaknesses Behavioral/Psych: Mood is stable, no new changes  Extremities: No lower extremity edema All other systems were reviewed with the patient and are negative.  I have reviewed the past medical history, past surgical history, social history and family history with the patient and they are unchanged from previous note.  ALLERGIES:  has No Known Allergies.  MEDICATIONS:  Current Outpatient Medications  Medication Sig Dispense Refill   amLODipine (NORVASC) 10 MG tablet Take 1 tablet (10 mg total) by mouth daily. 90 tablet 2   atenolol (TENORMIN) 50 MG tablet TAKE 1 TABLET(50 MG) BY MOUTH DAILY 30 tablet 2   cholecalciferol (VITAMIN D3) 25 MCG (1000 UNIT) tablet Take 1,000 Units by mouth daily.     doxazosin (CARDURA) 4 MG tablet Take 1 tablet (4 mg total) by mouth daily. 30 tablet 3   hydrALAZINE (APRESOLINE) 25 MG tablet Take 1 tablet (25 mg total) by mouth 3 (three) times daily. 90 tablet 1   lenvatinib 20 mg daily  dose (LENVIMA) 2 x 10 MG capsule Take 1 capsule (10 mg total) by mouth daily. 30 capsule 11   lidocaine-prilocaine (EMLA) cream Apply 1 application topically daily as needed. 30 g 3   LORazepam (ATIVAN) 0.5 MG tablet TAKE 1 TABLET(0.5 MG) BY MOUTH TWICE  DAILY AS NEEDED FOR ANXIETY 60 tablet 0   losartan (COZAAR) 50 MG tablet Take 1 tablet (50 mg total) by mouth daily. 30 tablet 3   MAGNESIUM-OXIDE 400 (240 Mg) MG tablet TAKE 1 TABLET(400 MG) BY MOUTH TWICE DAILY 60 tablet 1   molnupiravir EUA (LAGEVRIO) 200 mg CAPS capsule Take 4 capsules by mouth 2 times daily for 5 days. 40 capsule 0   ondansetron (ZOFRAN) 8 MG tablet Take 1 tablet (8 mg total) by mouth every 8 (eight) hours as needed for nausea. 30 tablet 3   oxybutynin (DITROPAN) 5 MG tablet Take 1 tablet (5 mg total) by mouth every 8 (eight) hours as needed for bladder spasms. 90 tablet 1   oxyCODONE (OXY IR/ROXICODONE) 5 MG immediate release tablet Take 1 tablet (5 mg total) by mouth every 4 (four) hours as needed for severe pain. 30 tablet 0   polyethylene glycol (MIRALAX / GLYCOLAX) 17 g packet Take 17 g by mouth 2 (two) times daily as needed for moderate constipation. 14 each 0   prochlorperazine (COMPAZINE) 10 MG tablet Take 1 tablet (10 mg total) by mouth every 6 (six) hours as needed for nausea or vomiting. 30 tablet 1   rivaroxaban (XARELTO) 20 MG TABS tablet Take 1 tablet (20 mg total) by mouth daily with supper. 30 tablet 3   senna-docusate (SENOKOT-S) 8.6-50 MG tablet Take 1 tablet by mouth 2 (two) times daily. 30 tablet 1   No current facility-administered medications for this visit.    PHYSICAL EXAMINATION: ECOG PERFORMANCE STATUS: 1 - Symptomatic but completely ambulatory  LABORATORY DATA:  I have reviewed the data as listed CMP Latest Ref Rng & Units 04/30/2021 04/09/2021 03/16/2021  Glucose 70 - 99 mg/dL 97 102(H) 102(H)  BUN 8 - 23 mg/dL 34(H) 32(H) 30(H)  Creatinine 0.44 - 1.00 mg/dL 1.34(H) 1.67(H) 1.32(H)  Sodium 135 - 145 mmol/L 138 137 134(L)  Potassium 3.5 - 5.1 mmol/L 4.6 4.3 4.4  Chloride 98 - 111 mmol/L 106 107 101  CO2 22 - 32 mmol/L $RemoveB'22 22 22  '[id number]$ Calcium 8.9 - 10.3 mg/dL 9.1 9.0 9.8  Total Protein 6.5 - 8.1 g/dL 7.3 7.4 8.1  Total Bilirubin 0.3 - 1.2 mg/dL  0.4 0.4 0.5  Alkaline Phos 38 - 126 U/L 118 120 198(H)  AST 15 - 41 U/L 29 35 46(H)  ALT 0 - 44 U/L 35 43 61(H)    Lab Results  Component Value Date   WBC 4.2 04/30/2021   HGB 9.2 (L) 04/30/2021   HCT 29.1 (L) 04/30/2021   MCV 71.3 (L) 04/30/2021   PLT 204 04/30/2021   NEUTROABS 2.7 04/30/2021    I discussed the assessment and treatment plan with the patient. The patient was provided an opportunity to ask questions and all were answered. The patient agreed with the plan and demonstrated an understanding of the instructions. The patient was advised to call back or seek an in-person evaluation if the symptoms worsen or if the condition fails to improve as anticipated.    I spent 20 minutes for the appointment reviewing test results, discuss management and coordination of care.  [name], MD 05/08/2021 1:51 PM

## 2021-05-16 ENCOUNTER — Other Ambulatory Visit (HOSPITAL_COMMUNITY): Payer: Self-pay

## 2021-05-18 ENCOUNTER — Telehealth: Payer: Self-pay

## 2021-05-18 NOTE — Telephone Encounter (Signed)
Returned her call. She tested positive today for covid and she moved her CT scan to 1/12.  She took the antiviral when she tested positive on 12/18. Everyone is her household currently has covid. She is still holding the [place]. She is having dizziness and does not feel that she should even drink the contrast.  When should I reschedule her apps on 1/6 and  I will send a scheduling message.

## 2021-05-18 NOTE — Telephone Encounter (Signed)
Since she was already treated she does not need another dose of antiviral Check her BP, if low we need to hold some BP meds I recommend holding off lenvima for 1 week and reschedule to see me in 2 weeks without treatment

## 2021-05-18 NOTE — Telephone Encounter (Signed)
Called and given below message. She verbalized understanding. 

## 2021-05-18 NOTE — Telephone Encounter (Signed)
Called and given below message. She verbalized understanding. Appts rescheduled and she is aware of appts.  BP today 127/89 am. 12/29 am 127/78, HR 71 and pm 136/80, HR 68 12/28 am 99/80, HR 78  12/27 am 142/79, HR 61 and pm 121/78, HR 70  She will call the office back if needed.

## 2021-05-18 NOTE — Telephone Encounter (Signed)
I recommend she stops hydralazine now and if SBP is still under 100, stop cardura

## 2021-05-20 ENCOUNTER — Other Ambulatory Visit (HOSPITAL_COMMUNITY): Payer: Self-pay

## 2021-05-21 ENCOUNTER — Other Ambulatory Visit (HOSPITAL_COMMUNITY): Payer: Self-pay

## 2021-05-21 ENCOUNTER — Encounter: Payer: Self-pay | Admitting: Hematology and Oncology

## 2021-05-22 ENCOUNTER — Inpatient Hospital Stay: Payer: Medicare Other

## 2021-05-22 ENCOUNTER — Ambulatory Visit (HOSPITAL_COMMUNITY): Payer: Medicare Other

## 2021-05-23 ENCOUNTER — Emergency Department (HOSPITAL_COMMUNITY): Payer: Medicare Other

## 2021-05-23 ENCOUNTER — Encounter (HOSPITAL_COMMUNITY): Payer: Self-pay | Admitting: Internal Medicine

## 2021-05-23 ENCOUNTER — Telehealth: Payer: Self-pay

## 2021-05-23 ENCOUNTER — Inpatient Hospital Stay (HOSPITAL_COMMUNITY): Payer: Medicare Other

## 2021-05-23 ENCOUNTER — Inpatient Hospital Stay (HOSPITAL_COMMUNITY)
Admission: EM | Admit: 2021-05-23 | Discharge: 2021-05-25 | DRG: 604 | Disposition: A | Discharge status: Home Health | Payer: Medicare Other | Attending: Family Medicine | Admitting: Family Medicine

## 2021-05-23 ENCOUNTER — Other Ambulatory Visit: Payer: Self-pay

## 2021-05-23 DIAGNOSIS — Z86718 Personal history of other venous thrombosis and embolism: Secondary | ICD-10-CM

## 2021-05-23 DIAGNOSIS — Z66 Do not resuscitate: Secondary | ICD-10-CM | POA: Diagnosis present

## 2021-05-23 DIAGNOSIS — I82C13 Acute embolism and thrombosis of internal jugular vein, bilateral: Secondary | ICD-10-CM | POA: Diagnosis present

## 2021-05-23 DIAGNOSIS — I129 Hypertensive chronic kidney disease with stage 1 through stage 4 chronic kidney disease, or unspecified chronic kidney disease: Secondary | ICD-10-CM | POA: Diagnosis present

## 2021-05-23 DIAGNOSIS — C55 Malignant neoplasm of uterus, part unspecified: Secondary | ICD-10-CM | POA: Diagnosis present

## 2021-05-23 DIAGNOSIS — F419 Anxiety disorder, unspecified: Secondary | ICD-10-CM | POA: Diagnosis present

## 2021-05-23 DIAGNOSIS — C7931 Secondary malignant neoplasm of brain: Secondary | ICD-10-CM | POA: Diagnosis present

## 2021-05-23 DIAGNOSIS — R809 Proteinuria, unspecified: Secondary | ICD-10-CM | POA: Diagnosis present

## 2021-05-23 DIAGNOSIS — N1831 Chronic kidney disease, stage 3a: Secondary | ICD-10-CM | POA: Diagnosis present

## 2021-05-23 DIAGNOSIS — S0083XA Contusion of other part of head, initial encounter: Secondary | ICD-10-CM | POA: Diagnosis present

## 2021-05-23 DIAGNOSIS — G936 Cerebral edema: Secondary | ICD-10-CM | POA: Diagnosis present

## 2021-05-23 DIAGNOSIS — Z20822 Contact with and (suspected) exposure to covid-19: Secondary | ICD-10-CM | POA: Diagnosis present

## 2021-05-23 DIAGNOSIS — R1110 Vomiting, unspecified: Secondary | ICD-10-CM | POA: Diagnosis present

## 2021-05-23 DIAGNOSIS — D631 Anemia in chronic kidney disease: Secondary | ICD-10-CM | POA: Diagnosis present

## 2021-05-23 DIAGNOSIS — E1122 Type 2 diabetes mellitus with diabetic chronic kidney disease: Secondary | ICD-10-CM | POA: Diagnosis present

## 2021-05-23 DIAGNOSIS — E871 Hypo-osmolality and hyponatremia: Secondary | ICD-10-CM | POA: Diagnosis present

## 2021-05-23 DIAGNOSIS — Z7901 Long term (current) use of anticoagulants: Secondary | ICD-10-CM

## 2021-05-23 DIAGNOSIS — E785 Hyperlipidemia, unspecified: Secondary | ICD-10-CM | POA: Diagnosis present

## 2021-05-23 DIAGNOSIS — Z8616 Personal history of COVID-19: Secondary | ICD-10-CM

## 2021-05-23 DIAGNOSIS — K581 Irritable bowel syndrome with constipation: Secondary | ICD-10-CM | POA: Diagnosis present

## 2021-05-23 DIAGNOSIS — E861 Hypovolemia: Secondary | ICD-10-CM | POA: Diagnosis present

## 2021-05-23 DIAGNOSIS — D563 Thalassemia minor: Secondary | ICD-10-CM | POA: Diagnosis present

## 2021-05-23 DIAGNOSIS — W2209XA Striking against other stationary object, initial encounter: Secondary | ICD-10-CM | POA: Diagnosis present

## 2021-05-23 DIAGNOSIS — Z800 Family history of malignant neoplasm of digestive organs: Secondary | ICD-10-CM | POA: Diagnosis not present

## 2021-05-23 DIAGNOSIS — C549 Malignant neoplasm of corpus uteri, unspecified: Secondary | ICD-10-CM

## 2021-05-23 DIAGNOSIS — I82C19 Acute embolism and thrombosis of unspecified internal jugular vein: Secondary | ICD-10-CM | POA: Diagnosis present

## 2021-05-23 DIAGNOSIS — Z9221 Personal history of antineoplastic chemotherapy: Secondary | ICD-10-CM

## 2021-05-23 DIAGNOSIS — Z79899 Other long term (current) drug therapy: Secondary | ICD-10-CM

## 2021-05-23 DIAGNOSIS — N183 Chronic kidney disease, stage 3 unspecified: Secondary | ICD-10-CM | POA: Diagnosis present

## 2021-05-23 DIAGNOSIS — G939 Disorder of brain, unspecified: Secondary | ICD-10-CM

## 2021-05-23 DIAGNOSIS — Y92002 Bathroom of unspecified non-institutional (private) residence single-family (private) house as the place of occurrence of the external cause: Secondary | ICD-10-CM

## 2021-05-23 DIAGNOSIS — Z8249 Family history of ischemic heart disease and other diseases of the circulatory system: Secondary | ICD-10-CM

## 2021-05-23 DIAGNOSIS — W182XXA Fall in (into) shower or empty bathtub, initial encounter: Secondary | ICD-10-CM | POA: Diagnosis present

## 2021-05-23 DIAGNOSIS — I10 Essential (primary) hypertension: Secondary | ICD-10-CM | POA: Diagnosis present

## 2021-05-23 DIAGNOSIS — S0990XA Unspecified injury of head, initial encounter: Secondary | ICD-10-CM

## 2021-05-23 HISTORY — DX: Malignant neoplasm of uterus, part unspecified: C55

## 2021-05-23 LAB — CBC WITH DIFFERENTIAL/PLATELET
Abs Immature Granulocytes: 0 10*3/uL (ref 0.00–0.07)
Basophils Absolute: 0.1 10*3/uL (ref 0.0–0.1)
Basophils Relative: 1 %
Eosinophils Absolute: 0 10*3/uL (ref 0.0–0.5)
Eosinophils Relative: 0 %
HCT: 33.5 % — ABNORMAL LOW (ref 36.0–46.0)
Hemoglobin: 10.7 g/dL — ABNORMAL LOW (ref 12.0–15.0)
Lymphocytes Relative: 8 %
Lymphs Abs: 0.4 10*3/uL — ABNORMAL LOW (ref 0.7–4.0)
MCH: 22.1 pg — ABNORMAL LOW (ref 26.0–34.0)
MCHC: 31.9 g/dL (ref 30.0–36.0)
MCV: 69.2 fL — ABNORMAL LOW (ref 80.0–100.0)
Monocytes Absolute: 0.4 10*3/uL (ref 0.1–1.0)
Monocytes Relative: 7 %
Neutro Abs: 4.5 10*3/uL (ref 1.7–7.7)
Neutrophils Relative %: 84 %
Platelets: 226 10*3/uL (ref 150–400)
RBC: 4.84 MIL/uL (ref 3.87–5.11)
RDW: 15.2 % (ref 11.5–15.5)
WBC: 5.3 10*3/uL (ref 4.0–10.5)
nRBC: 0 % (ref 0.0–0.2)
nRBC: 0 /100 WBC

## 2021-05-23 LAB — COMPREHENSIVE METABOLIC PANEL
ALT: 10 U/L (ref 0–44)
AST: 17 U/L (ref 15–41)
Albumin: 3.6 g/dL (ref 3.5–5.0)
Alkaline Phosphatase: 77 U/L (ref 38–126)
Anion gap: 13 (ref 5–15)
BUN: 20 mg/dL (ref 8–23)
CO2: 22 mmol/L (ref 22–32)
Calcium: 9.6 mg/dL (ref 8.9–10.3)
Chloride: 90 mmol/L — ABNORMAL LOW (ref 98–111)
Creatinine, Ser: 1.06 mg/dL — ABNORMAL HIGH (ref 0.44–1.00)
GFR, Estimated: 55 mL/min — ABNORMAL LOW (ref >60)
Glucose, Bld: 138 mg/dL — ABNORMAL HIGH (ref 70–99)
Potassium: 3.7 mmol/L (ref 3.5–5.1)
Sodium: 125 mmol/L — ABNORMAL LOW (ref 135–145)
Total Bilirubin: 0.7 mg/dL (ref 0.3–1.2)
Total Protein: 6.9 g/dL (ref 6.5–8.1)

## 2021-05-23 LAB — RESP PANEL BY RT-PCR (FLU A&B, COVID) ARPGX2
Influenza A by PCR: NEGATIVE
Influenza B by PCR: NEGATIVE
SARS Coronavirus 2 by RT PCR: NEGATIVE

## 2021-05-23 LAB — LIPASE, BLOOD: Lipase: 51 U/L (ref 11–51)

## 2021-05-23 IMAGING — MR MR HEAD WO/W CM
9 of 13 series · 34 of 48 positions shown · IV contrast (gadavist)
Comparison: Head CT earlier same day.

CLINICAL DATA: Brain metastasis suspected. Fall with head trauma.
Abnormal head CT.

EXAM:
MRI HEAD WITHOUT AND WITH CONTRAST
TECHNIQUE: Multiplanar, multiecho pulse sequences of the brain and surrounding
structures were obtained without and with intravenous contrast.
CONTRAST:  5mL GADAVIST GADOBUTROL 1 MMOL/ML IV SOLN

[Series 4: DWI · axial · 3.0mm · 1.09mm/px · z∈[-44,+106]mm · 9 of 102 slices shown (1 of 4)]
[im 1/102]
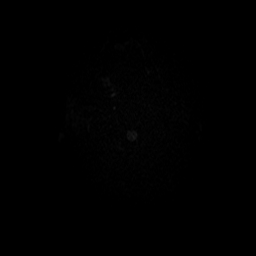
[im 13/102]
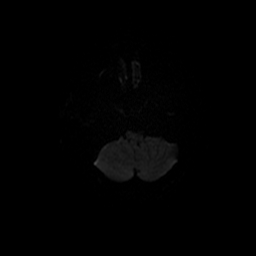
[im 26/102]
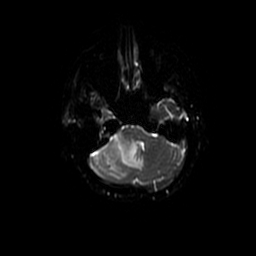
[im 38/102]
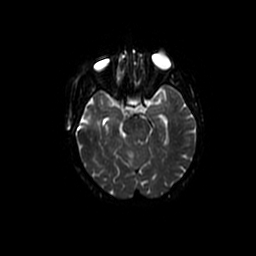
[im 51/102]
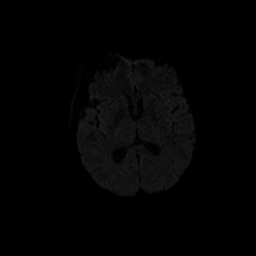
[im 64/102]
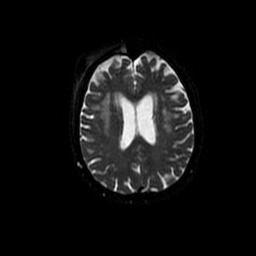
[im 76/102]
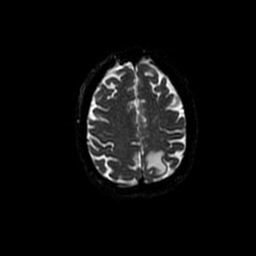
[im 89/102]
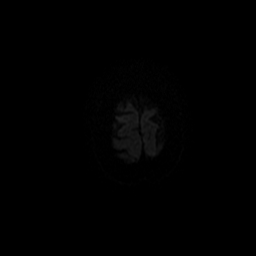
[im 102/102]
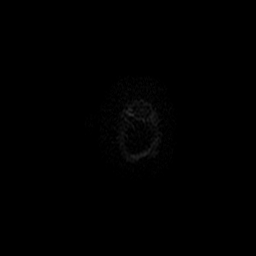

[Series 5: DWI · coronal · 5.0mm · 1.09mm/px · 6 of 70 slices shown (2 of 4)]
[im 1/70]
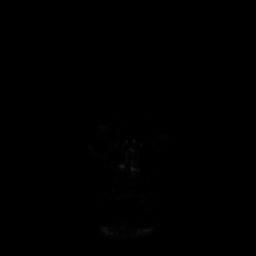
[im 14/70]
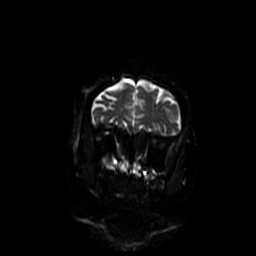
[im 28/70]
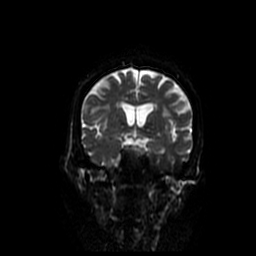
[im 42/70]
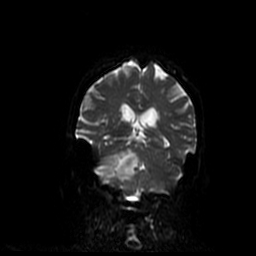
[im 56/70]
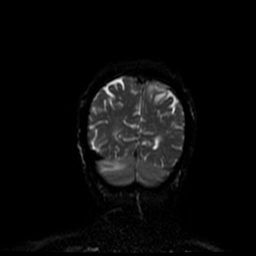
[im 70/70]
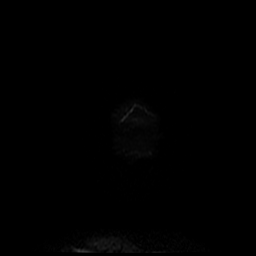

[Series 7: T2 · axial · 5.0mm · 0.43mm/px · z∈[-46,+104]mm · 2 of 26 slices shown]
[im 1/26]
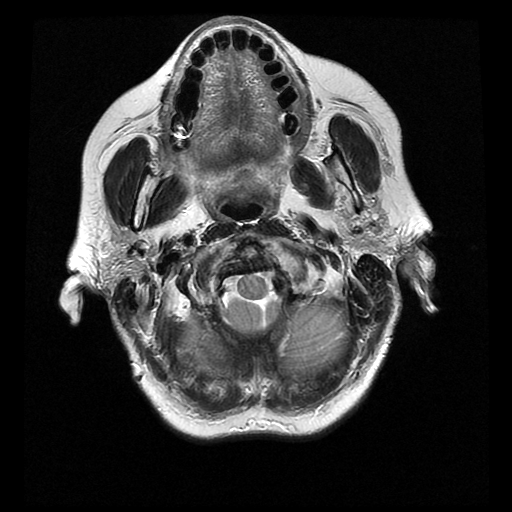
[im 26/26]
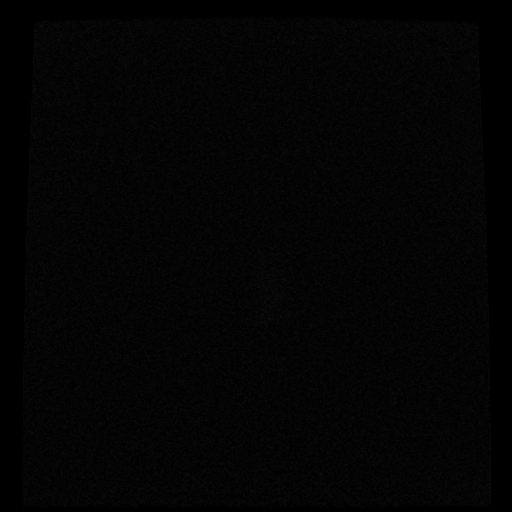

[Series 8: FLAIR · axial · 3.0mm · 0.43mm/px · z∈[-49,+101]mm · 2 of 26 slices shown]
[im 1/26]
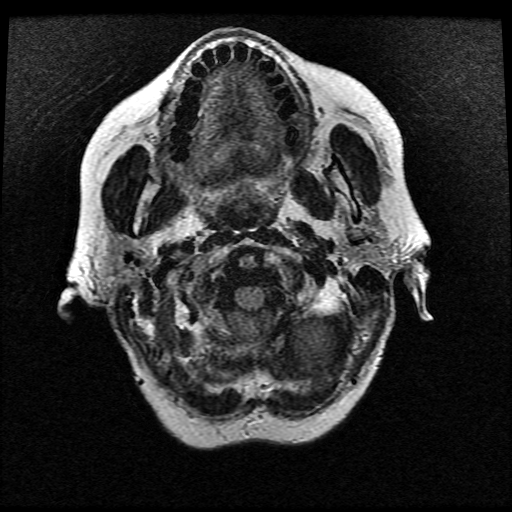
[im 26/26]
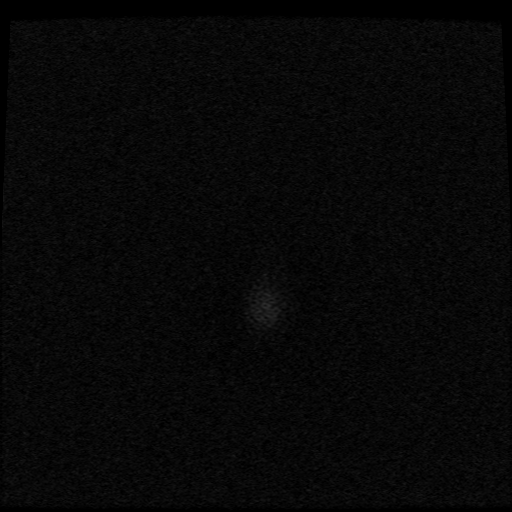

[Series 12: T1 post-contrast · axial · 3.0mm · 0.47mm/px · z∈[-45,+102]mm · 4 of 50 slices shown (1 of 3)]
[im 1/50]
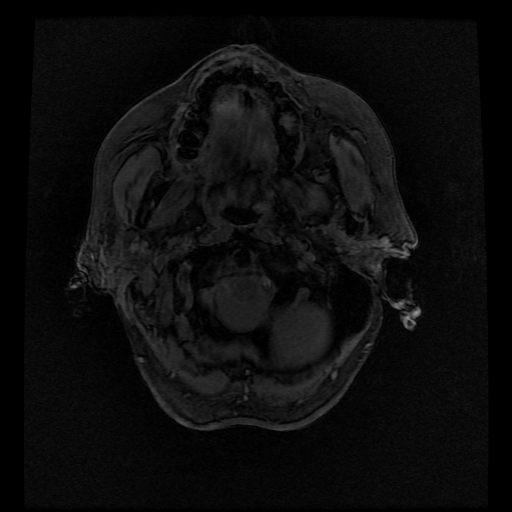
[im 17/50]
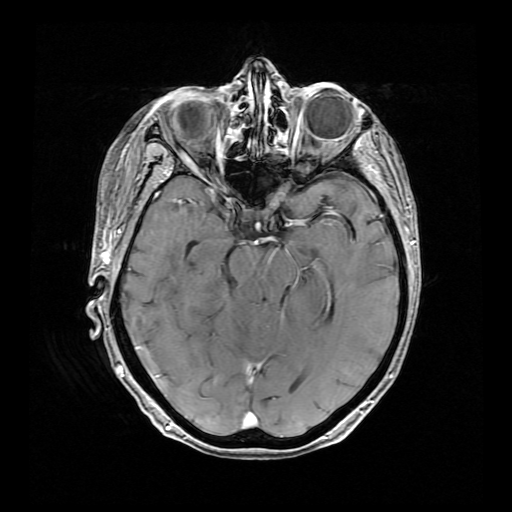
[im 33/50]
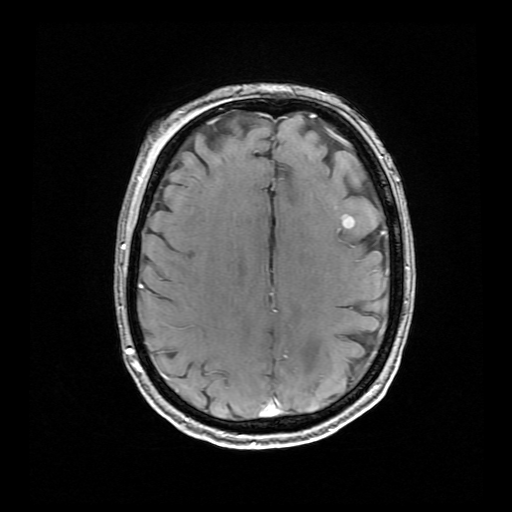
[im 50/50]
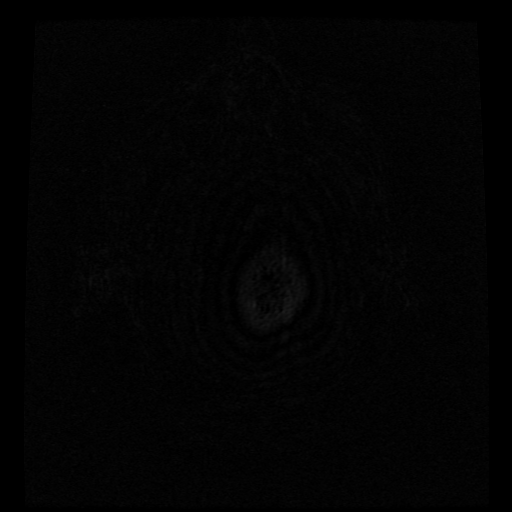

[Series 13: T1 post-contrast · coronal · 5.0mm · 0.39mm/px · 2 of 27 slices shown (2 of 3)]
[im 1/27]
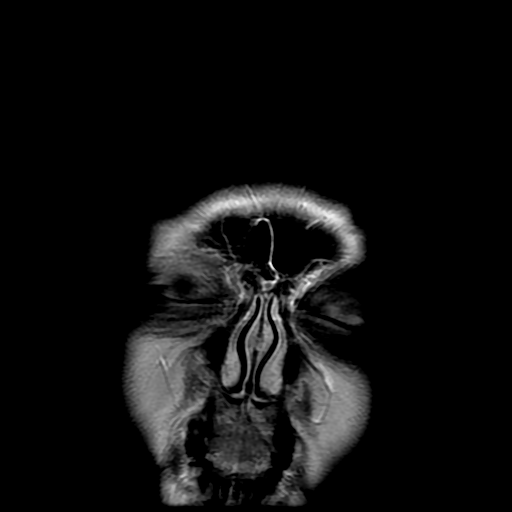
[im 27/27]
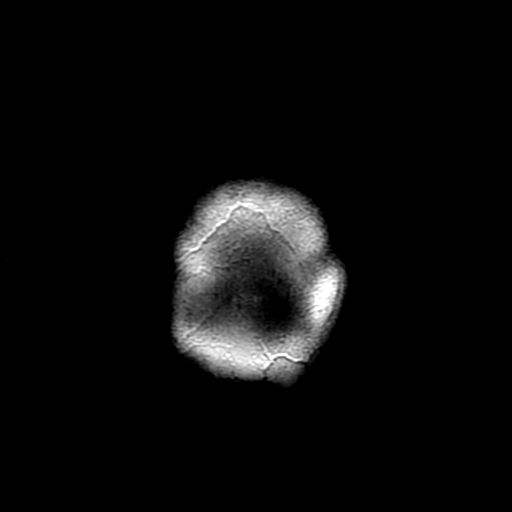

[Series 14: T1 post-contrast · sagittal · 5.0mm · 0.47mm/px · 2 of 25 slices shown (3 of 3)]
[im 1/25]
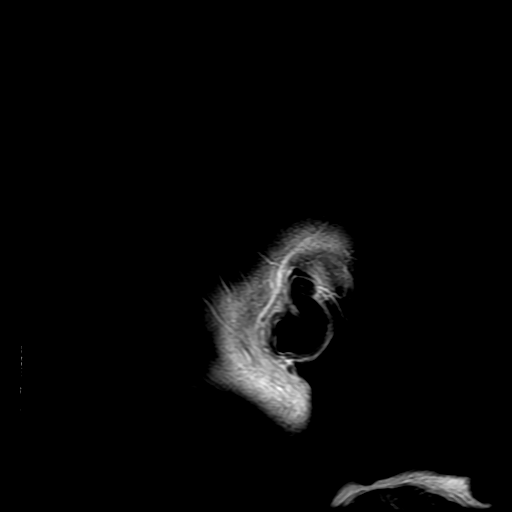
[im 25/25]
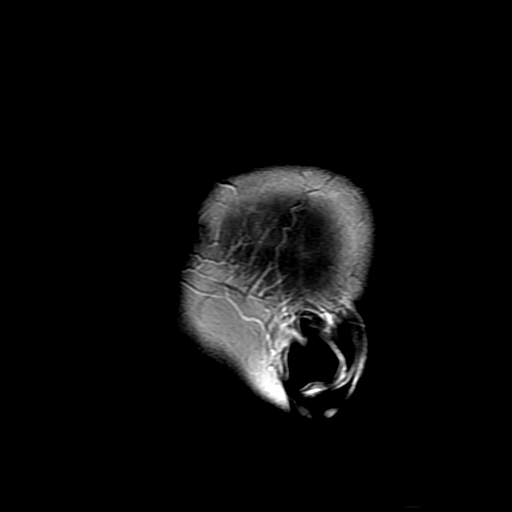

[Series 400: DWI · axial · 3.0mm · 1.09mm/px · z∈[-44,+100]mm · 4 of 49 slices shown (3 of 4)]
[im 1/49]
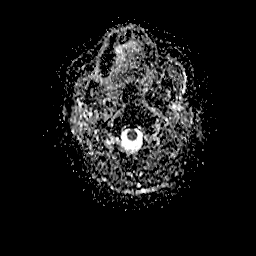
[im 17/49]
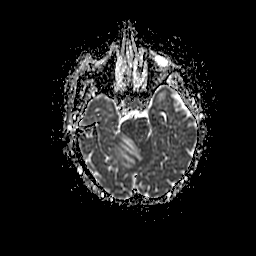
[im 33/49]
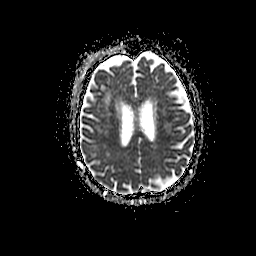
[im 49/49]
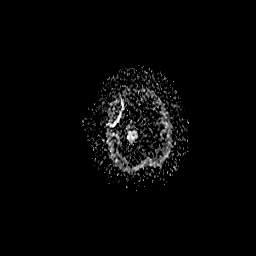

[Series 500: DWI · coronal · 5.0mm · 1.09mm/px · 3 of 35 slices shown (4 of 4)]
[im 1/35]
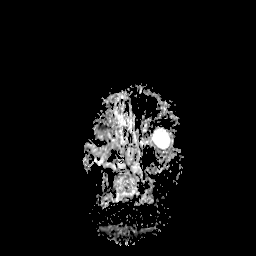
[im 18/35]
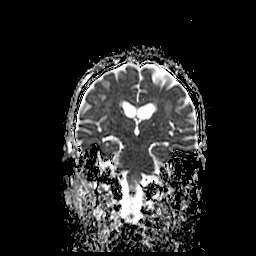
[im 35/35]
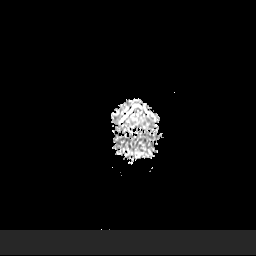

[34 of 48 positions shown; findings below may reference images not displayed]

FINDINGS: Brain: Multiple brain masses. In the right lateral cerebellum, there
is a partially necrotic metastasis measuring 3.7 cm in diameter with
surrounding edema and mass effect. This causes marked narrowing of
the fourth ventricle. 2 mm metastasis in the left cerebellum axial
image 6. 7 mm metastasis in the left frontal lobe axial image 33.
Possible 2 mm metastasis just anterior to that. 13 mm metastasis in
the left parietal vertex with regional vasogenic edema.

Elsewhere, there chronic small-vessel ischemic changes affecting the
pons and cerebral hemispheric white matter. No obstructive
hydrocephalus at this moment. No extra-axial collection.

Vascular: Major vessels at the base of the brain show flow.

Skull and upper cervical spine: Negative

Sinuses/Orbits: Mucosal inflammatory changes of the paranasal
sinuses. Fluid levels in the right frontal sinus per orbits negative

Other: Right frontal scalp sebaceous cyst.
IMPRESSION: Multiple brain metastases. Two cerebellar metastases, the larger in
the right lateral hemisphere measuring up to 3.7 cm in size with
pronounced associated vasogenic edema and mass effect, resulting in
narrowing of the fourth ventricle but apparently without complete
ventricular obstruction at this moment. 2 mm metastasis in the left
cerebellum.

Two definite metastases in the left cerebral hemisphere in the left
posterior frontal region and at the left parietal vertex measuring 7
mm and 13 mm respectively. Mild vasogenic edema surrounding the
parietal metastasis. Possible additional 2 mm metastasis just
anterior to the left frontal lesion.

Paranasal sinusitis, most pronounced affecting the right frontal
sinus.

## 2021-05-23 IMAGING — CT CT CERVICAL SPINE W/O CM
3 series · 9 of 33 positions shown, 11 images · non-contrast
Comparison: None.

CLINICAL DATA: Provided history: Head trauma, penetrating.
Additional history provided: Recent fall, on Xarelto,
nausea/vomiting.

EXAM:
CT HEAD WITHOUT CONTRAST
CT CERVICAL SPINE WITHOUT CONTRAST
TECHNIQUE: Multidetector CT imaging of the head and cervical spine was
performed following the standard protocol without intravenous
contrast. Multiplanar CT image reconstructions of the cervical spine
were also generated.

[Series 5: c spine soft · axial · 0.31mm/px · z∈[-188,-188]mm · 1 of 87 slices shown, 2 images]
[im 47/87  soft-tissue]
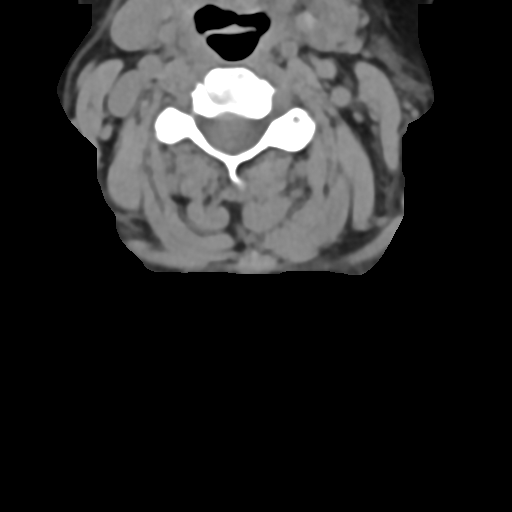
[im 47/87  bone]
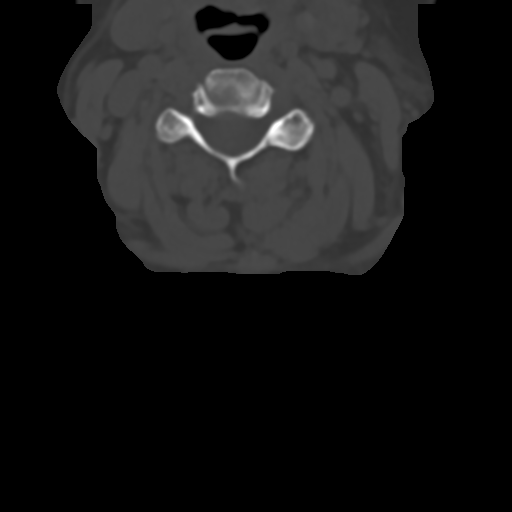

[Series 8: sag bone · sagittal · 0.26mm/px · 5 of 66 slices shown, 6 images]
[im 22/66  bone]
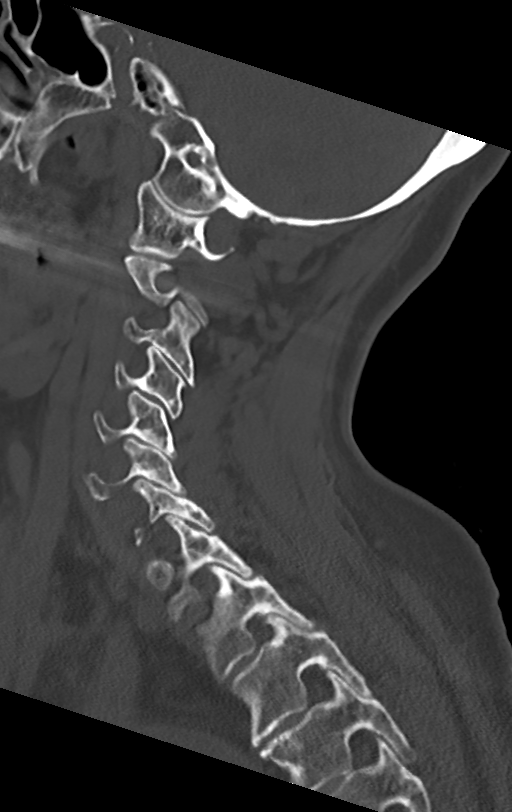
[im 28/66  bone]
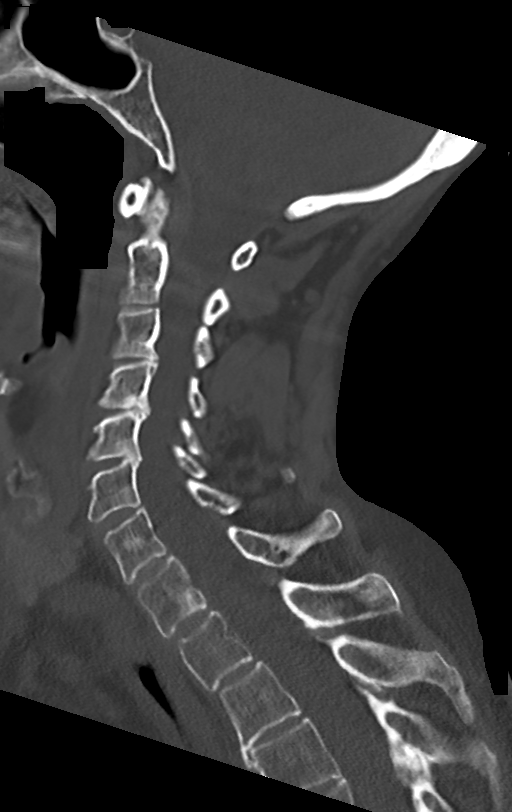
[im 33/66  soft-tissue]
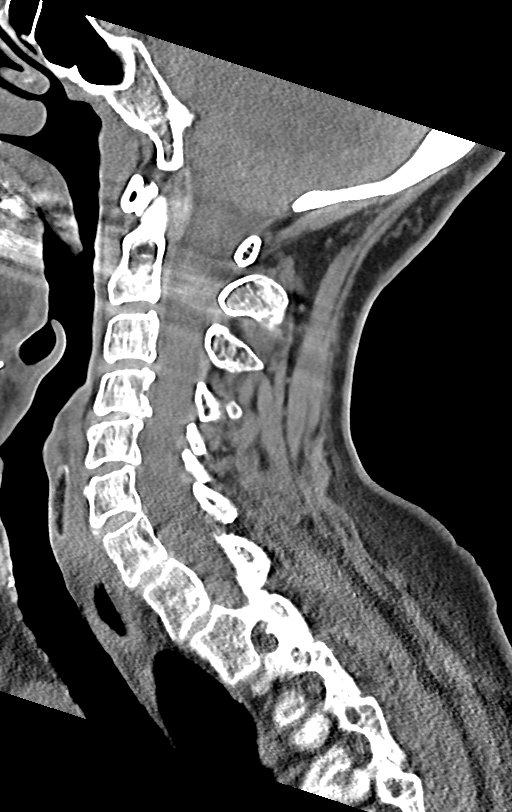
[im 33/66  bone]
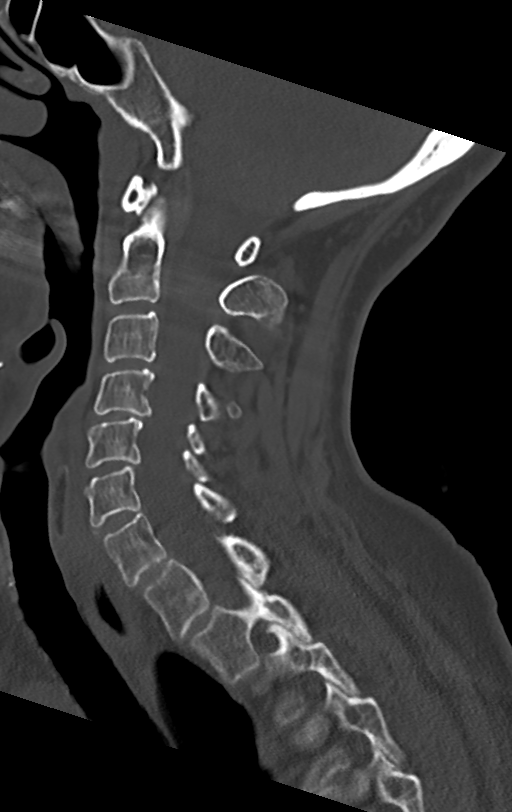
[im 38/66  bone]
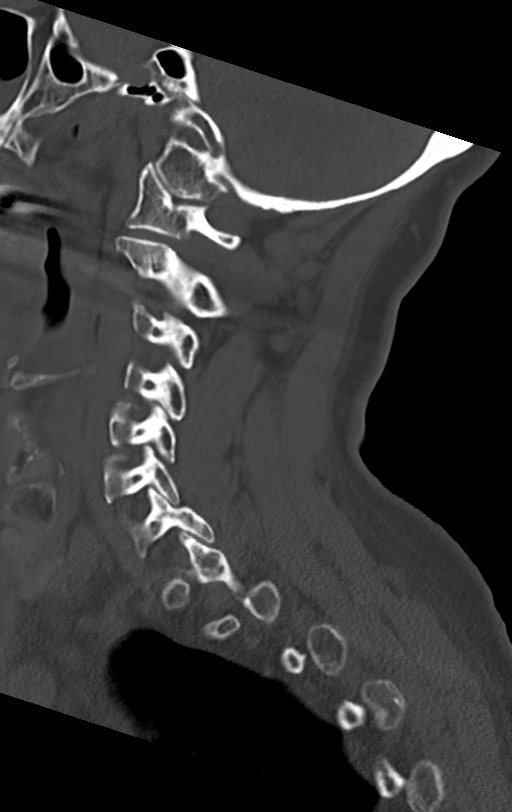
[im 44/66  bone]
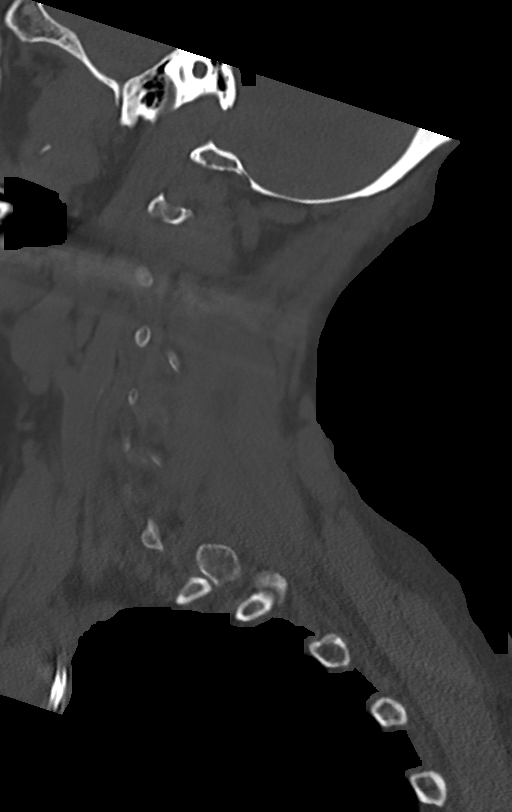

[Series 9: cor bone · coronal · 0.23mm/px · 3 of 76 slices shown]
[im 16/76  bone]
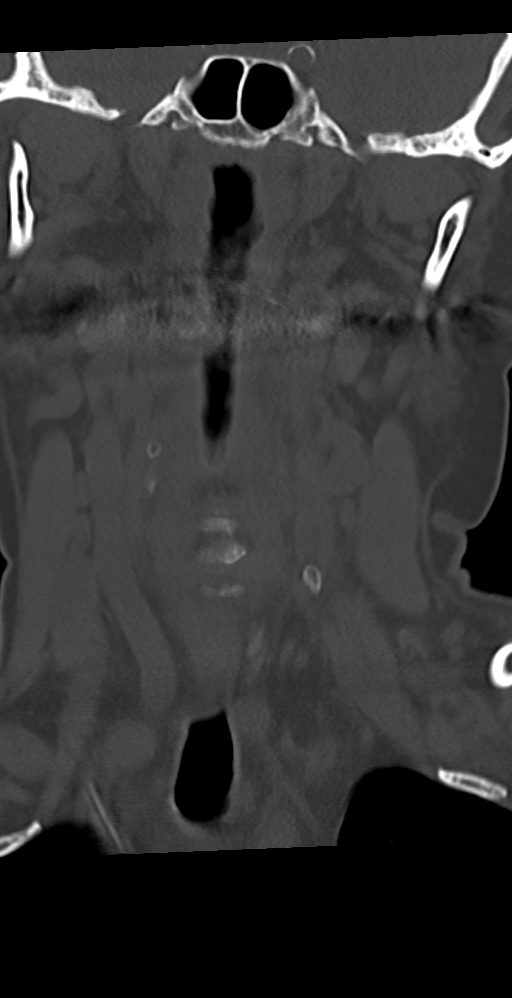
[im 31/76  bone]
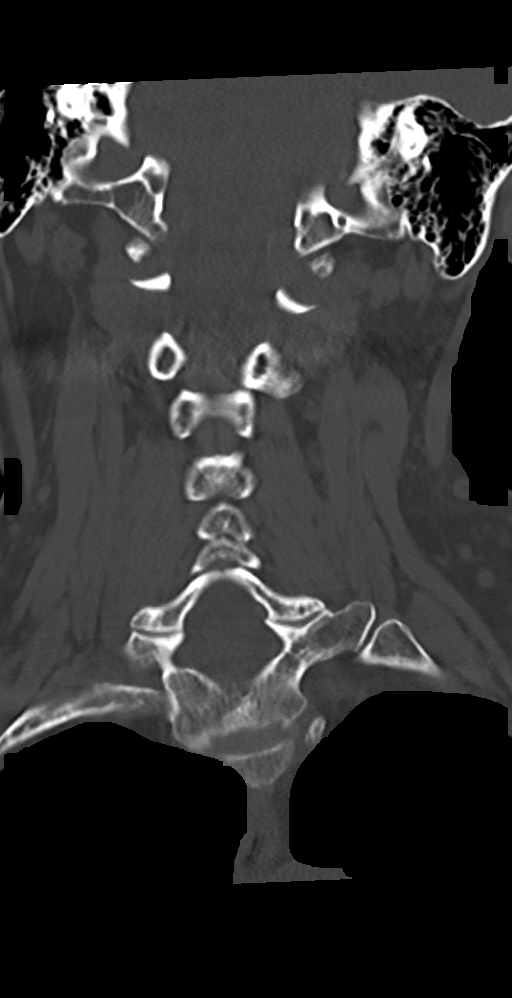
[im 46/76  bone]
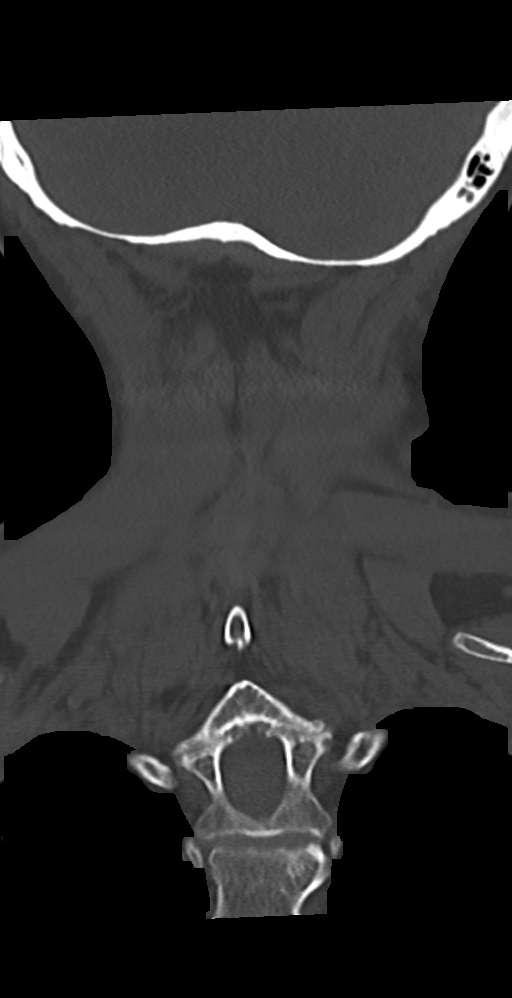

[9 of 33 positions shown; findings below may reference images not displayed]

FINDINGS: CT HEAD FINDINGS

Brain:

Cerebral volume appears normal for age.

There is prominent vasogenic edema within the right cerebellar
hemisphere, highly suspicious for underlying metastatic lesion(s) at
this site. Associated posterior fossa mass effect with partial
effacement of the fourth ventricle and likely some mass effect upon
the dorsal pons. No evidence of obstructive hydrocephalus at this
time. The right cerebellar tonsil is displaced inferiorly below the
level of foramen magnum by 4 mm.

Moderate-sized focus of vasogenic edema within the left parietal
lobe, suspicious for an additional site of parenchymal metastatic
disease (for instance as seen on series 3, image 24).

Background mild patchy and ill-defined hypoattenuation within the
cerebral white matter, nonspecific but compatible chronic small
vessel ischemic disease. Superimposed additional sites of mild
vasogenic edema (an additional metastatic lesions) are difficult to
exclude by noncontrast CT.

No acute intracranial hemorrhage is identified.

No acute demarcated cortical infarct.

No extra-axial fluid collection.

No supratentorial midline shift.

Vascular: No hyperdense vessel.  Atherosclerotic calcifications.

Skull: Normal. Negative for fracture or focal lesion.

Sinuses/Orbits: Visualized orbits show no acute finding. Extensive
partial opacification of the right frontal sinus due to the presence
of fluid and mucosal thickening. Mild mucosal thickening and
possible fluid within the left frontal sinus. Scattered mucosal
thickening and fluid within the bilateral ethmoid sinuses. Moderate
mucosal thickening within the right maxillary sinus. Mild mucosal
thickening within the left maxillary sinus.

Other: Right frontotemporal scalp/periorbital hematoma.

CT CERVICAL SPINE FINDINGS

Alignment: Mild cervicothoracic dextrocurvature. Trace grade 1
retrolisthesis at C3-C4, C4-C5 and C5-C6.

Skull base and vertebrae: The basion-dental and atlanto-dental
intervals are maintained.No evidence of acute fracture to the
cervical spine. Nonspecific subcentimeter sclerotic foci within the
T1 vertebral body.

Soft tissues and spinal canal: No prevertebral fluid or swelling. No
visible canal hematoma. Subcentimeter nodule within the left thyroid
lobe.

Disc levels: Cervical spondylosis with multilevel disc space
narrowing, disc bulges/central disc protrusions, endplate spurring
and uncovertebral hypertrophy. Disc space narrowing is greatest at
C4-C5 (moderate at this level). No appreciable high-grade spinal
canal stenosis. Multilevel bony neural foraminal narrowing.

Upper chest: No consolidation within the imaged lung apices. No
visible pneumothorax. Partially imaged right chest infusion port
catheter.

CT head impressions #1 and #2 called by telephone at the time of
interpretation on [DATE] at [DATE] to provider Dr. CHAI, who
verbally acknowledged these results.
IMPRESSION: CT head:

1. Prominent vasogenic edema within the right cerebellar hemisphere,
highly suspicious for underlying metastatic lesion(s) at this site.
Associated cerebellar swelling with partial effacement of the fourth
ventricle, and likely some mass effect upon the dorsal pons. No
evidence of obstructive hydrocephalus at this time. Inferior
displacement of the right cerebellar tonsil below the level of the
foramen magnum by 4 mm.
2. Moderate-sized focus of vasogenic edema within the left parietal
lobe suspicious for an additional site of parenchymal metastatic
disease. A brain MRI with contrast is recommended for further
evaluation of the lesions within the left parietal lobe and right
cerebellar hemisphere, and to better assess for any additional
intracranial metastatic disease.
3. Mild chronic small vessel image changes within the cerebral white
matter.
4. Paranasal sinus disease, as described.
5. Right frontotemporal scalp/periorbital hematoma.

CT cervical spine:

1. No evidence of acute fracture to the cervical spine.
2. Mild cervicothoracic dextrocurvature.
3. Mild grade 1 retrolisthesis at C3-C4, C4-C5 and C5-C6.
4. Subcentimeter sclerotic foci T1 vertebral body. Although
nonspecific, sites of osseous metastatic disease cannot be excluded.
A nonemergent thoracic spine MRI with contrast may be helpful for
further characterization.
5. Cervical spondylosis, as described.

## 2021-05-23 IMAGING — CT CT HEAD W/O CM
3 of 4 series · 13 of 47 positions shown, 15 images · non-contrast
Comparison: None.

CLINICAL DATA: Provided history: Head trauma, penetrating.
Additional history provided: Recent fall, on Xarelto,
nausea/vomiting.

EXAM:
CT HEAD WITHOUT CONTRAST
CT CERVICAL SPINE WITHOUT CONTRAST
TECHNIQUE: Multidetector CT imaging of the head and cervical spine was
performed following the standard protocol without intravenous
contrast. Multiplanar CT image reconstructions of the cervical spine
were also generated.

[Series 3: head wo · axial · 0.43mm/px · z∈[-128,-8]mm · 7 of 33 slices shown, 9 images]
[im 5/33  brain]
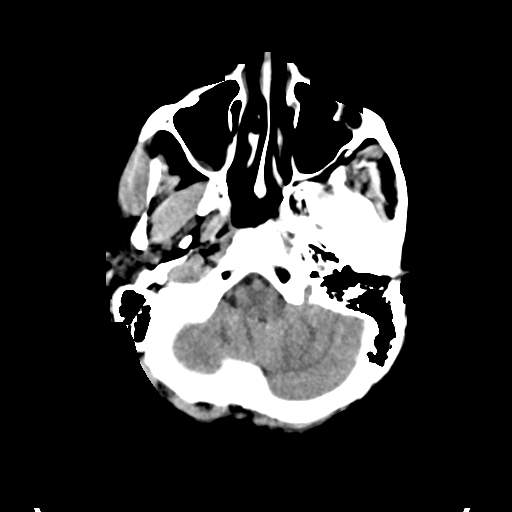
[im 5/33  bone]
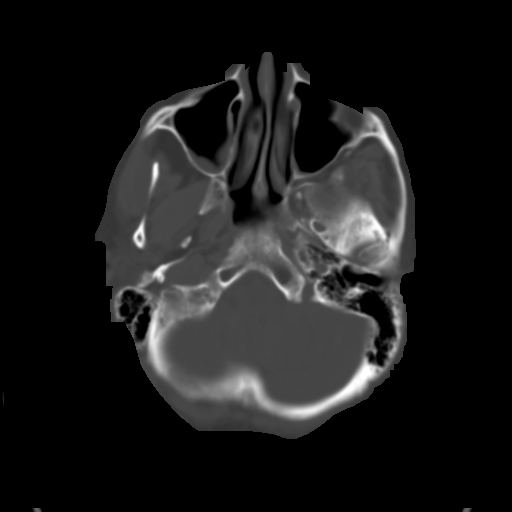
[im 9/33  brain]
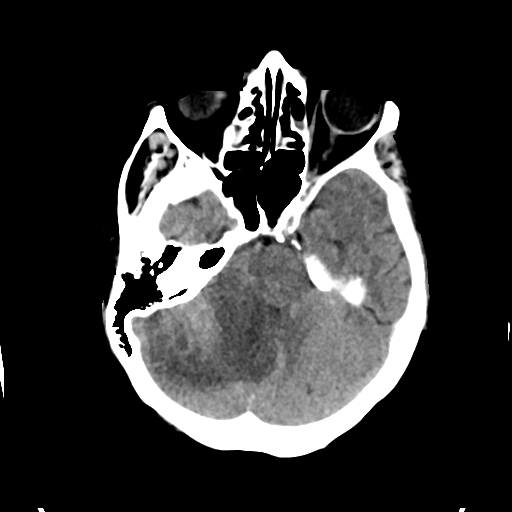
[im 13/33  brain]
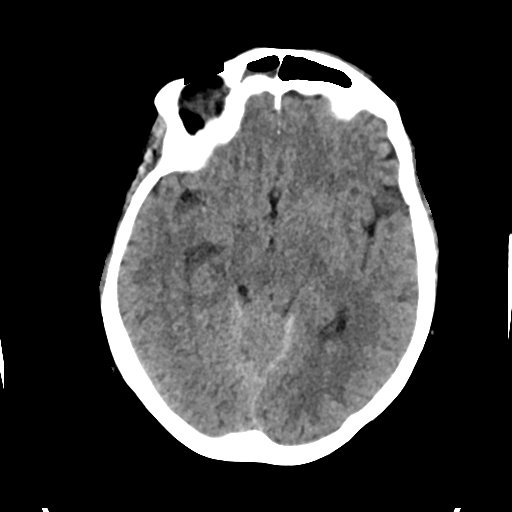
[im 17/33  brain]
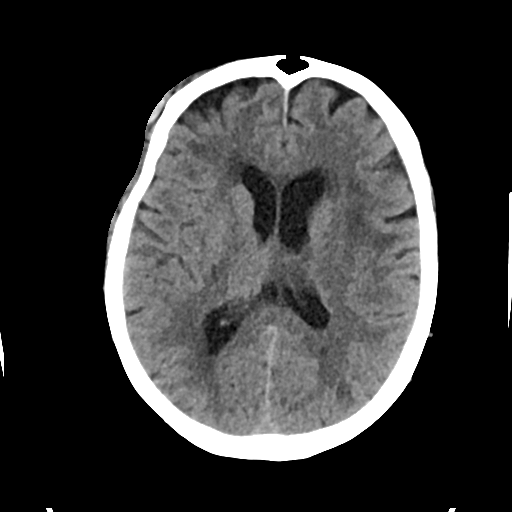
[im 21/33  brain]
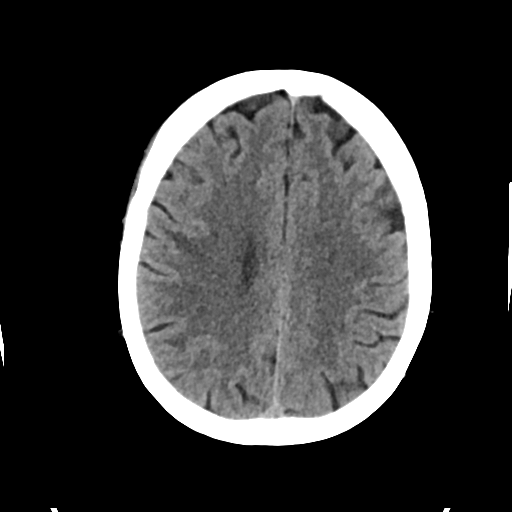
[im 21/33  bone]
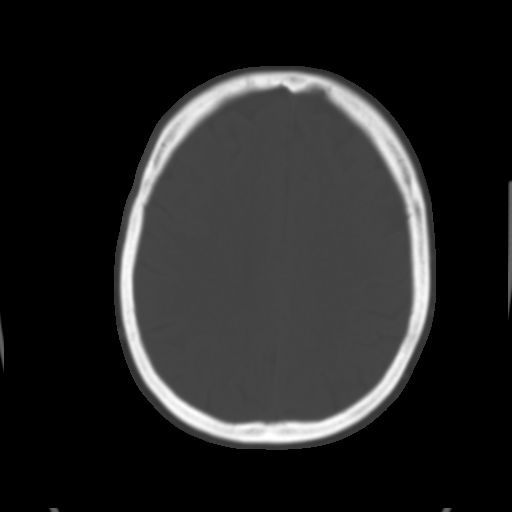
[im 25/33  brain]
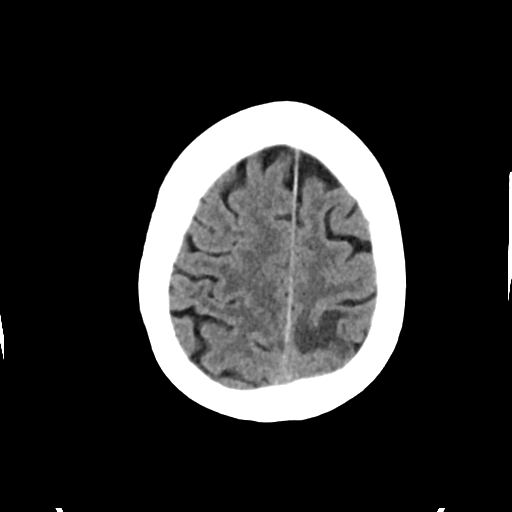
[im 29/33  brain]
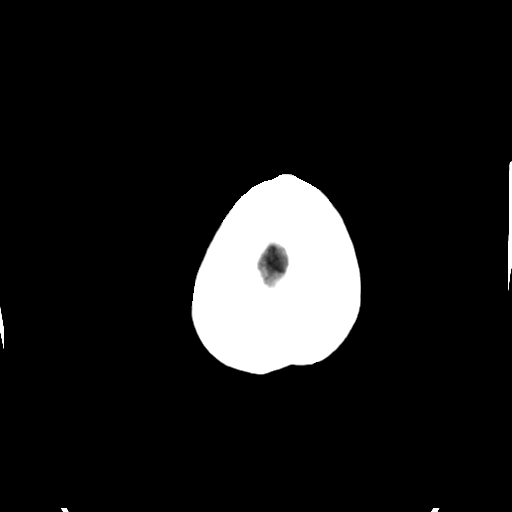

[Series 5: cor soft · coronal · 0.34mm/px · 3 of 69 slices shown]
[im 23/69  brain]
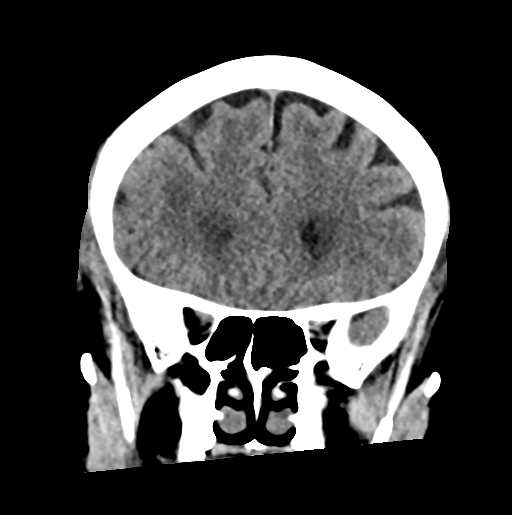
[im 31/69  brain]
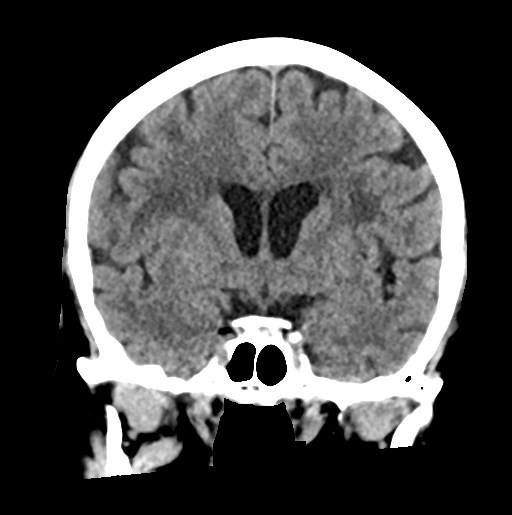
[im 38/69  brain]
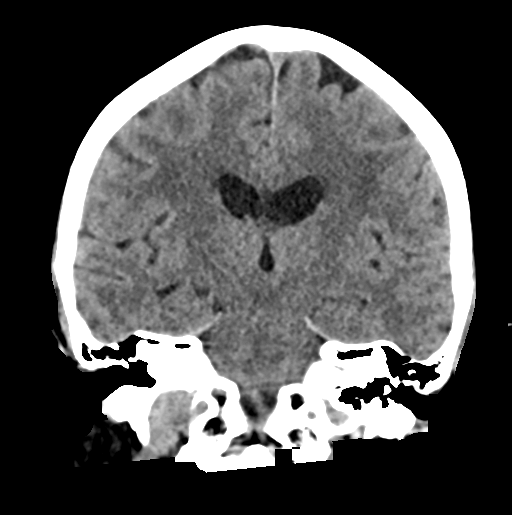

[Series 6: sag soft · sagittal · 0.32mm/px · 3 of 58 slices shown]
[im 20/58  brain]
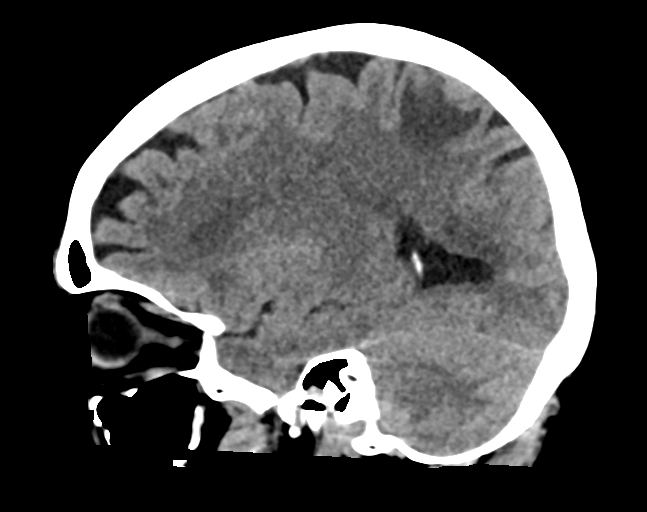
[im 29/58  brain]
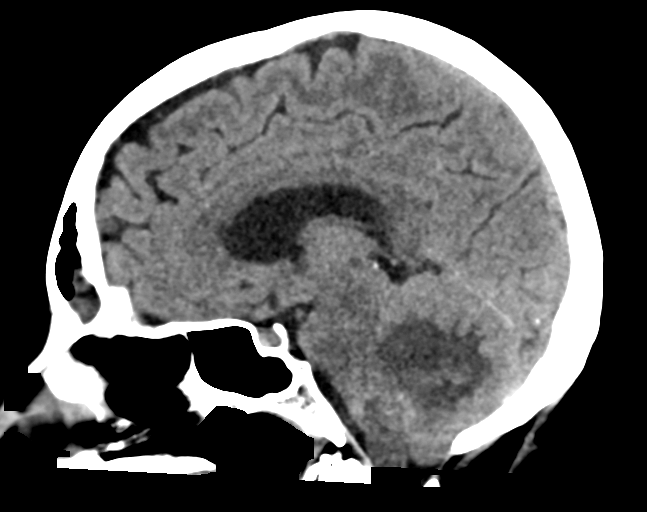
[im 39/58  brain]
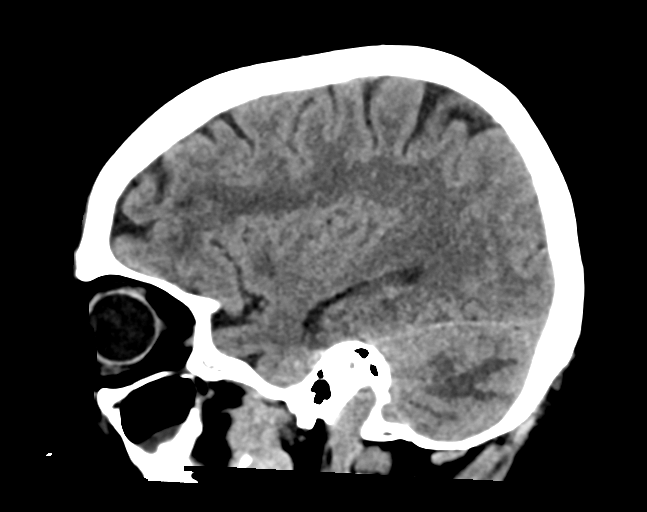

[13 of 47 positions shown; findings below may reference images not displayed]

FINDINGS: CT HEAD FINDINGS

Brain:

Cerebral volume appears normal for age.

There is prominent vasogenic edema within the right cerebellar
hemisphere, highly suspicious for underlying metastatic lesion(s) at
this site. Associated posterior fossa mass effect with partial
effacement of the fourth ventricle and likely some mass effect upon
the dorsal pons. No evidence of obstructive hydrocephalus at this
time. The right cerebellar tonsil is displaced inferiorly below the
level of foramen magnum by 4 mm.

Moderate-sized focus of vasogenic edema within the left parietal
lobe, suspicious for an additional site of parenchymal metastatic
disease (for instance as seen on series 3, image 24).

Background mild patchy and ill-defined hypoattenuation within the
cerebral white matter, nonspecific but compatible chronic small
vessel ischemic disease. Superimposed additional sites of mild
vasogenic edema (an additional metastatic lesions) are difficult to
exclude by noncontrast CT.

No acute intracranial hemorrhage is identified.

No acute demarcated cortical infarct.

No extra-axial fluid collection.

No supratentorial midline shift.

Vascular: No hyperdense vessel.  Atherosclerotic calcifications.

Skull: Normal. Negative for fracture or focal lesion.

Sinuses/Orbits: Visualized orbits show no acute finding. Extensive
partial opacification of the right frontal sinus due to the presence
of fluid and mucosal thickening. Mild mucosal thickening and
possible fluid within the left frontal sinus. Scattered mucosal
thickening and fluid within the bilateral ethmoid sinuses. Moderate
mucosal thickening within the right maxillary sinus. Mild mucosal
thickening within the left maxillary sinus.

Other: Right frontotemporal scalp/periorbital hematoma.

CT CERVICAL SPINE FINDINGS

Alignment: Mild cervicothoracic dextrocurvature. Trace grade 1
retrolisthesis at C3-C4, C4-C5 and C5-C6.

Skull base and vertebrae: The basion-dental and atlanto-dental
intervals are maintained.No evidence of acute fracture to the
cervical spine. Nonspecific subcentimeter sclerotic foci within the
T1 vertebral body.

Soft tissues and spinal canal: No prevertebral fluid or swelling. No
visible canal hematoma. Subcentimeter nodule within the left thyroid
lobe.

Disc levels: Cervical spondylosis with multilevel disc space
narrowing, disc bulges/central disc protrusions, endplate spurring
and uncovertebral hypertrophy. Disc space narrowing is greatest at
C4-C5 (moderate at this level). No appreciable high-grade spinal
canal stenosis. Multilevel bony neural foraminal narrowing.

Upper chest: No consolidation within the imaged lung apices. No
visible pneumothorax. Partially imaged right chest infusion port
catheter.

CT head impressions #1 and #2 called by telephone at the time of
interpretation on [DATE] at [DATE] to provider Dr. CHAI, who
verbally acknowledged these results.
IMPRESSION: CT head:

1. Prominent vasogenic edema within the right cerebellar hemisphere,
highly suspicious for underlying metastatic lesion(s) at this site.
Associated cerebellar swelling with partial effacement of the fourth
ventricle, and likely some mass effect upon the dorsal pons. No
evidence of obstructive hydrocephalus at this time. Inferior
displacement of the right cerebellar tonsil below the level of the
foramen magnum by 4 mm.
2. Moderate-sized focus of vasogenic edema within the left parietal
lobe suspicious for an additional site of parenchymal metastatic
disease. A brain MRI with contrast is recommended for further
evaluation of the lesions within the left parietal lobe and right
cerebellar hemisphere, and to better assess for any additional
intracranial metastatic disease.
3. Mild chronic small vessel image changes within the cerebral white
matter.
4. Paranasal sinus disease, as described.
5. Right frontotemporal scalp/periorbital hematoma.

CT cervical spine:

1. No evidence of acute fracture to the cervical spine.
2. Mild cervicothoracic dextrocurvature.
3. Mild grade 1 retrolisthesis at C3-C4, C4-C5 and C5-C6.
4. Subcentimeter sclerotic foci T1 vertebral body. Although
nonspecific, sites of osseous metastatic disease cannot be excluded.
A nonemergent thoracic spine MRI with contrast may be helpful for
further characterization.
5. Cervical spondylosis, as described.

## 2021-05-23 MED ORDER — OXYCODONE HCL 5 MG PO TABS
5.0000 mg | ORAL_TABLET | ORAL | Status: DC | PRN
Start: 2021-05-23 — End: 2021-05-25

## 2021-05-23 MED ORDER — MORPHINE SULFATE (PF) 2 MG/ML IV SOLN: 2.0000 mg | INTRAVENOUS | Status: DC | PRN

## 2021-05-23 MED ORDER — POLYETHYLENE GLYCOL 3350 17 G PO PACK: 17.0000 g | PACK | Freq: Every day | ORAL | Status: DC | PRN

## 2021-05-23 MED ORDER — HYDRALAZINE HCL 20 MG/ML IJ SOLN: 5.0000 mg | INTRAMUSCULAR | Status: DC | PRN

## 2021-05-23 MED ORDER — HYDRALAZINE HCL 25 MG PO TABS
25.0000 mg | ORAL_TABLET | Freq: Three times a day (TID) | ORAL | Status: DC
Start: 2021-05-23 — End: 2021-05-23

## 2021-05-23 MED ORDER — BISACODYL 5 MG PO TBEC: 5.0000 mg | DELAYED_RELEASE_TABLET | Freq: Every day | ORAL | Status: DC | PRN

## 2021-05-23 MED ORDER — GADOBUTROL 1 MMOL/ML IV SOLN
5.0000 mL | Freq: Once | INTRAVENOUS | Status: AC | PRN
  Administered 2021-05-23: 5 mL via INTRAVENOUS

## 2021-05-23 MED ORDER — LORAZEPAM 0.5 MG PO TABS: 0.5000 mg | ORAL_TABLET | Freq: Two times a day (BID) | ORAL | Status: DC | PRN

## 2021-05-23 MED ORDER — RIVAROXABAN 20 MG PO TABS
20.0000 mg | ORAL_TABLET | Freq: Every day | ORAL | Status: DC
  Administered 2021-05-23: 20 mg via ORAL
  Filled 2021-05-23: qty 2

## 2021-05-23 MED ORDER — ONDANSETRON HCL 4 MG PO TABS: 4.0000 mg | ORAL_TABLET | Freq: Four times a day (QID) | ORAL | Status: DC | PRN

## 2021-05-23 MED ORDER — ALBUTEROL SULFATE (2.5 MG/3ML) 0.083% IN NEBU: 2.5000 mg | INHALATION_SOLUTION | RESPIRATORY_TRACT | Status: DC | PRN

## 2021-05-23 MED ORDER — DEXAMETHASONE SODIUM PHOSPHATE 10 MG/ML IJ SOLN: 10.0000 mg | INTRAMUSCULAR | Status: DC

## 2021-05-23 MED ORDER — DOCUSATE SODIUM 100 MG PO CAPS
100.0000 mg | ORAL_CAPSULE | Freq: Two times a day (BID) | ORAL | Status: DC
  Administered 2021-05-23 – 2021-05-25 (×4): 100 mg via ORAL
  Filled 2021-05-23 (×4): qty 1

## 2021-05-23 MED ORDER — ATENOLOL 50 MG PO TABS
50.0000 mg | ORAL_TABLET | Freq: Every day | ORAL | Status: DC
Start: 2021-05-23 — End: 2021-05-25
  Administered 2021-05-24 – 2021-05-25 (×2): 50 mg via ORAL
  Filled 2021-05-23 (×2): qty 1

## 2021-05-23 MED ORDER — ACETAMINOPHEN 650 MG RE SUPP: 650.0000 mg | Freq: Four times a day (QID) | RECTAL | Status: DC | PRN

## 2021-05-23 MED ORDER — SODIUM CHLORIDE 0.9% FLUSH
3.0000 mL | Freq: Two times a day (BID) | INTRAVENOUS | Status: DC
  Administered 2021-05-23 – 2021-05-25 (×3): 3 mL via INTRAVENOUS

## 2021-05-23 MED ORDER — AMLODIPINE BESYLATE 10 MG PO TABS
10.0000 mg | ORAL_TABLET | Freq: Every day | ORAL | Status: DC
  Administered 2021-05-24 – 2021-05-25 (×2): 10 mg via ORAL
  Filled 2021-05-23 (×2): qty 1

## 2021-05-23 MED ORDER — DOXAZOSIN MESYLATE 4 MG PO TABS
4.0000 mg | ORAL_TABLET | Freq: Every day | ORAL | Status: DC
  Administered 2021-05-24 – 2021-05-25 (×2): 4 mg via ORAL
  Filled 2021-05-23 (×2): qty 1

## 2021-05-23 MED ORDER — ACETAMINOPHEN 325 MG PO TABS: 650.0000 mg | ORAL_TABLET | Freq: Four times a day (QID) | ORAL | Status: DC | PRN

## 2021-05-23 MED ORDER — OXYBUTYNIN CHLORIDE 5 MG PO TABS: 5.0000 mg | ORAL_TABLET | Freq: Three times a day (TID) | ORAL | Status: DC | PRN

## 2021-05-23 MED ORDER — DEXAMETHASONE SODIUM PHOSPHATE 10 MG/ML IJ SOLN
10.0000 mg | Freq: Once | INTRAMUSCULAR | Status: AC
Start: 2021-05-23 — End: 2021-05-23
  Administered 2021-05-23: 10 mg via INTRAVENOUS
  Filled 2021-05-23: qty 1

## 2021-05-23 MED ORDER — ONDANSETRON HCL 4 MG/2ML IJ SOLN: 4.0000 mg | Freq: Four times a day (QID) | INTRAMUSCULAR | Status: DC | PRN

## 2021-05-23 NOTE — ED Provider Triage Note (Signed)
Emergency Medicine Provider Triage Evaluation Note  MERON BOCCHINO , a 76 y.o. female  was evaluated in triage.  Pt complains of nausea and vomiting since hitting her head on Saturday.  Patient is on Xarelto 20 mg daily.  Patient states she slipped while getting into the bathtub on Saturday.  She has had 2-3 episodes of emesis daily since then.  She admits to dizziness ever since testing positive for COVID.  She notes her headache has improved.  No visual or speech changes.  Denies unilateral weakness.  Review of Systems  Positive: Nausea and vomiting Negative: CP  Physical Exam  BP 112/82    Pulse 87    Temp 97.8 F (36.6 C) (Oral)    Resp 20    SpO2 100%  Gen:   Awake, no distress   Resp:  Normal effort  MSK:   Moves extremities without difficulty  Other:  AAOx4. No facial droop. Normal speech. No pronator drift  Medical Decision Making  Medically screening exam initiated at 12:29 PM.  Appropriate orders placed.  [name] was informed that the remainder of the evaluation will be completed by another provider, this initial triage assessment does not replace that evaluation, and the importance of remaining in the ED until their evaluation is complete.  Fall on thinners. Charge RN, karen, made aware of need for room. CT scanner called and patient brought to scanner for STAT CT scan Labs to rule out electrolyte abnormalities due to nausea and vomiting   Suzy Bouchard, PA-C 05/23/21 1232

## 2021-05-23 NOTE — ED Provider Notes (Signed)
[facility] EMERGENCY DEPARTMENT Provider Note   CSN: [id number] Arrival date & time: 05/23/21  1206     History  Chief Complaint  Patient presents with   Fall   Emesis    Teresa Oliver is a 76 y.o. female.  She has a history of uterine cancer and is on chemotherapy, follows with Dr. Alvy Bimler.  She fell 5 days ago in the tub striking her head.  She denies any loss of consciousness.  She said she had COVID a few weeks ago and its caused her to feel dizzy and that is why she fell.  She continues to feel dizzy and for the last 2 days it had 2 episodes of vomiting a day.  No blurry vision double vision weakness numbness abdominal pain.  She is on blood thinners Xarelto  The history is provided by the patient.  Fall This is a new problem. The current episode started more than 2 days ago. The problem has not changed since onset.Pertinent negatives include no chest pain, no abdominal pain, no headaches and no shortness of breath. Associated symptoms comments: Dizziness. The symptoms are aggravated by walking. Nothing relieves the symptoms. She has tried rest for the symptoms. The treatment provided no relief.  Emesis Associated symptoms: no abdominal pain, no fever, no headaches and no sore throat       Home Medications Prior to Admission medications   Medication Sig Start Date End Date Taking? Authorizing Provider  amLODipine (NORVASC) 10 MG tablet Take 1 tablet (10 mg total) by mouth daily. 12/08/20   [name], MD  atenolol (TENORMIN) 50 MG tablet TAKE 1 TABLET(50 MG) BY MOUTH DAILY 04/19/21   [name], MD  cholecalciferol (VITAMIN D3) 25 MCG (1000 UNIT) tablet Take 1,000 Units by mouth daily.    [provider]  doxazosin (CARDURA) 4 MG tablet Take 1 tablet (4 mg total) by mouth daily. 03/16/21   [name], MD  hydrALAZINE (APRESOLINE) 25 MG tablet Take 1 tablet (25 mg total) by mouth 3 (three) times daily. 04/30/21   [name], MD  lenvatinib 20 mg  daily dose (LENVIMA) 2 x 10 MG capsule Take 1 capsule (10 mg total) by mouth daily. 02/20/21   [name], MD  lidocaine-prilocaine (EMLA) cream Apply 1 application topically daily as needed. 09/11/20   [name], MD  LORazepam (ATIVAN) 0.5 MG tablet TAKE 1 TABLET(0.5 MG) BY MOUTH TWICE DAILY AS NEEDED FOR ANXIETY 12/29/20   [name], MD  losartan (COZAAR) 50 MG tablet Take 1 tablet (50 mg total) by mouth daily. 02/23/21   [name], MD  MAGNESIUM-OXIDE 400 (240 Mg) MG tablet TAKE 1 TABLET(400 MG) BY MOUTH TWICE DAILY 04/25/21   [name], MD  ondansetron (ZOFRAN) 8 MG tablet Take 1 tablet (8 mg total) by mouth every 8 (eight) hours as needed for nausea. 09/11/20   [name], MD  oxybutynin (DITROPAN) 5 MG tablet Take 1 tablet (5 mg total) by mouth every 8 (eight) hours as needed for bladder spasms. 09/04/20   Dana Allan I, MD  oxyCODONE (OXY IR/ROXICODONE) 5 MG immediate release tablet Take 1 tablet (5 mg total) by mouth every 4 (four) hours as needed for severe pain. 02/19/21   [name], MD  polyethylene glycol (MIRALAX / GLYCOLAX) 17 g packet Take 17 g by mouth 2 (two) times daily as needed for moderate constipation. 09/04/20   Bonnell Public, MD  prochlorperazine (COMPAZINE) 10 MG tablet Take 1 tablet (10  mg total) by mouth every 6 (six) hours as needed for nausea or vomiting. 09/11/20   [name], MD  rivaroxaban (XARELTO) 20 MG TABS tablet Take 1 tablet (20 mg total) by mouth daily with supper. 03/13/21   [name], MD  senna-docusate (SENOKOT-S) 8.6-50 MG tablet Take 1 tablet by mouth 2 (two) times daily. 09/04/20   Bonnell Public, MD      Allergies    Patient has no known allergies.    Review of Systems   Review of Systems  Constitutional:  Negative for fever.  HENT:  Negative for sore throat.   Eyes:  Negative for visual disturbance.  Respiratory:  Negative for shortness of breath.   Cardiovascular:  Negative for chest pain.  Gastrointestinal:  Positive for  vomiting. Negative for abdominal pain.  Genitourinary:  Negative for dysuria.  Musculoskeletal:  Negative for neck pain.  Skin:  Negative for rash.  Neurological:  Positive for dizziness. Negative for speech difficulty, weakness, numbness and headaches.   Physical Exam Updated Vital Signs BP 112/82    Pulse 87    Temp 97.8 F (36.6 C) (Oral)    Resp 20    SpO2 100%  Physical Exam Vitals and nursing note reviewed.  Constitutional:      General: She is not in acute distress.    Appearance: Normal appearance. She is well-developed.  HENT:     Head: Normocephalic.     Comments: She has a hematoma over her right forehead and some ecchymosis around her right eye. Eyes:     Conjunctiva/sclera: Conjunctivae normal.  Cardiovascular:     Rate and Rhythm: Normal rate and regular rhythm.     Heart sounds: No murmur heard. Pulmonary:     Effort: Pulmonary effort is normal. No respiratory distress.     Breath sounds: Normal breath sounds.  Abdominal:     Palpations: Abdomen is soft.     Tenderness: There is no abdominal tenderness. There is no guarding or rebound.  Musculoskeletal:        General: No swelling or deformity.     Cervical back: Neck supple.  Skin:    General: Skin is warm and dry.     Capillary Refill: Capillary refill takes less than 2 seconds.  Neurological:     General: No focal deficit present.     Mental Status: She is alert and oriented to person, place, and time.     Cranial Nerves: No cranial nerve deficit.     Sensory: No sensory deficit.     Motor: No weakness.  Psychiatric:        Mood and Affect: Mood normal.    ED Results / Procedures / Treatments   Labs (all labs ordered are listed, but only abnormal results are displayed) Labs Reviewed  CBC WITH DIFFERENTIAL/PLATELET - Abnormal; Notable for the following components:      Result Value   Hemoglobin 10.7 (*)    HCT 33.5 (*)    MCV 69.2 (*)    MCH 22.1 (*)    Lymphs Abs 0.4 (*)    All other  components within normal limits  COMPREHENSIVE METABOLIC PANEL - Abnormal; Notable for the following components:   Sodium 125 (*)    Chloride 90 (*)    Glucose, Bld 138 (*)    Creatinine, Ser 1.06 (*)    GFR, Estimated 55 (*)    All other components within normal limits  RESP PANEL BY RT-PCR (FLU A&B, COVID) ARPGX2  LIPASE, BLOOD  BASIC METABOLIC PANEL  CBC    EKG None  Radiology CT Head Wo Contrast  Result Date: 05/23/2021 CLINICAL DATA:  Provided history: Head trauma, penetrating. Additional history provided: Recent fall, on Xarelto, nausea/vomiting. EXAM: CT HEAD WITHOUT CONTRAST CT CERVICAL SPINE WITHOUT CONTRAST TECHNIQUE: Multidetector CT imaging of the head and cervical spine was performed following the standard protocol without intravenous contrast. Multiplanar CT image reconstructions of the cervical spine were also generated. COMPARISON:  None. FINDINGS: CT HEAD FINDINGS Brain: Cerebral volume appears normal for age. There is prominent vasogenic edema within the right cerebellar hemisphere, highly suspicious for underlying metastatic lesion(s) at this site. Associated posterior fossa mass effect with partial effacement of the fourth ventricle and likely some mass effect upon the dorsal pons. No evidence of obstructive hydrocephalus at this time. The right cerebellar tonsil is displaced inferiorly below the level of foramen magnum by 4 mm. Moderate-sized focus of vasogenic edema within the left parietal lobe, suspicious for an additional site of parenchymal metastatic disease (for instance as seen on series 3, image 24). Background mild patchy and ill-defined hypoattenuation within the cerebral white matter, nonspecific but compatible chronic small vessel ischemic disease. Superimposed additional sites of mild vasogenic edema (an additional metastatic lesions) are difficult to exclude by noncontrast CT. No acute intracranial hemorrhage is identified. No acute demarcated cortical infarct.  No extra-axial fluid collection. No supratentorial midline shift. Vascular: No hyperdense vessel.  Atherosclerotic calcifications. Skull: Normal. Negative for fracture or focal lesion. Sinuses/Orbits: Visualized orbits show no acute finding. Extensive partial opacification of the right frontal sinus due to the presence of fluid and mucosal thickening. Mild mucosal thickening and possible fluid within the left frontal sinus. Scattered mucosal thickening and fluid within the bilateral ethmoid sinuses. Moderate mucosal thickening within the right maxillary sinus. Mild mucosal thickening within the left maxillary sinus. Other: Right frontotemporal scalp/periorbital hematoma. CT CERVICAL SPINE FINDINGS Alignment: Mild cervicothoracic dextrocurvature. Trace grade 1 retrolisthesis at C3-C4, C4-C5 and C5-C6. Skull base and vertebrae: The basion-dental and atlanto-dental intervals are maintained.No evidence of acute fracture to the cervical spine. Nonspecific subcentimeter sclerotic foci within the T1 vertebral body. Soft tissues and spinal canal: No prevertebral fluid or swelling. No visible canal hematoma. Subcentimeter nodule within the left thyroid lobe. Disc levels: Cervical spondylosis with multilevel disc space narrowing, disc bulges/central disc protrusions, endplate spurring and uncovertebral hypertrophy. Disc space narrowing is greatest at C4-C5 (moderate at this level). No appreciable high-grade spinal canal stenosis. Multilevel bony neural foraminal narrowing. Upper chest: No consolidation within the imaged lung apices. No visible pneumothorax. Partially imaged right chest infusion port catheter. CT head impressions #1 and #2 called by telephone at the time of interpretation on 05/23/2021 at 1:09 pm to provider Dr. Melina Copa, who verbally acknowledged these results. IMPRESSION: CT head: 1. Prominent vasogenic edema within the right cerebellar hemisphere, highly suspicious for underlying metastatic lesion(s) at this  site. Associated cerebellar swelling with partial effacement of the fourth ventricle, and likely some mass effect upon the dorsal pons. No evidence of obstructive hydrocephalus at this time. Inferior displacement of the right cerebellar tonsil below the level of the foramen magnum by 4 mm. 2. Moderate-sized focus of vasogenic edema within the left parietal lobe suspicious for an additional site of parenchymal metastatic disease. A brain MRI with contrast is recommended for further evaluation of the lesions within the left parietal lobe and right cerebellar hemisphere, and to better assess for any additional intracranial metastatic disease. 3. Mild chronic small vessel image  changes within the cerebral white matter. 4. Paranasal sinus disease, as described. 5. Right frontotemporal scalp/periorbital hematoma. CT cervical spine: 1. No evidence of acute fracture to the cervical spine. 2. Mild cervicothoracic dextrocurvature. 3. Mild grade 1 retrolisthesis at C3-C4, C4-C5 and C5-C6. 4. Subcentimeter sclerotic foci T1 vertebral body. Although nonspecific, sites of osseous metastatic disease cannot be excluded. A nonemergent thoracic spine MRI with contrast may be helpful for further characterization. 5. Cervical spondylosis, as described. Electronically Signed   By: Kellie Simmering D.O.   On: 05/23/2021 13:11   CT Cervical Spine Wo Contrast  Result Date: 05/23/2021 CLINICAL DATA:  Provided history: Head trauma, penetrating. Additional history provided: Recent fall, on Xarelto, nausea/vomiting. EXAM: CT HEAD WITHOUT CONTRAST CT CERVICAL SPINE WITHOUT CONTRAST TECHNIQUE: Multidetector CT imaging of the head and cervical spine was performed following the standard protocol without intravenous contrast. Multiplanar CT image reconstructions of the cervical spine were also generated. COMPARISON:  None. FINDINGS: CT HEAD FINDINGS Brain: Cerebral volume appears normal for age. There is prominent vasogenic edema within the right  cerebellar hemisphere, highly suspicious for underlying metastatic lesion(s) at this site. Associated posterior fossa mass effect with partial effacement of the fourth ventricle and likely some mass effect upon the dorsal pons. No evidence of obstructive hydrocephalus at this time. The right cerebellar tonsil is displaced inferiorly below the level of foramen magnum by 4 mm. Moderate-sized focus of vasogenic edema within the left parietal lobe, suspicious for an additional site of parenchymal metastatic disease (for instance as seen on series 3, image 24). Background mild patchy and ill-defined hypoattenuation within the cerebral white matter, nonspecific but compatible chronic small vessel ischemic disease. Superimposed additional sites of mild vasogenic edema (an additional metastatic lesions) are difficult to exclude by noncontrast CT. No acute intracranial hemorrhage is identified. No acute demarcated cortical infarct. No extra-axial fluid collection. No supratentorial midline shift. Vascular: No hyperdense vessel.  Atherosclerotic calcifications. Skull: Normal. Negative for fracture or focal lesion. Sinuses/Orbits: Visualized orbits show no acute finding. Extensive partial opacification of the right frontal sinus due to the presence of fluid and mucosal thickening. Mild mucosal thickening and possible fluid within the left frontal sinus. Scattered mucosal thickening and fluid within the bilateral ethmoid sinuses. Moderate mucosal thickening within the right maxillary sinus. Mild mucosal thickening within the left maxillary sinus. Other: Right frontotemporal scalp/periorbital hematoma. CT CERVICAL SPINE FINDINGS Alignment: Mild cervicothoracic dextrocurvature. Trace grade 1 retrolisthesis at C3-C4, C4-C5 and C5-C6. Skull base and vertebrae: The basion-dental and atlanto-dental intervals are maintained.No evidence of acute fracture to the cervical spine. Nonspecific subcentimeter sclerotic foci within the T1  vertebral body. Soft tissues and spinal canal: No prevertebral fluid or swelling. No visible canal hematoma. Subcentimeter nodule within the left thyroid lobe. Disc levels: Cervical spondylosis with multilevel disc space narrowing, disc bulges/central disc protrusions, endplate spurring and uncovertebral hypertrophy. Disc space narrowing is greatest at C4-C5 (moderate at this level). No appreciable high-grade spinal canal stenosis. Multilevel bony neural foraminal narrowing. Upper chest: No consolidation within the imaged lung apices. No visible pneumothorax. Partially imaged right chest infusion port catheter. CT head impressions #1 and #2 called by telephone at the time of interpretation on 05/23/2021 at 1:09 pm to provider Dr. Melina Copa, who verbally acknowledged these results. IMPRESSION: CT head: 1. Prominent vasogenic edema within the right cerebellar hemisphere, highly suspicious for underlying metastatic lesion(s) at this site. Associated cerebellar swelling with partial effacement of the fourth ventricle, and likely some mass effect upon the dorsal pons. No evidence  of obstructive hydrocephalus at this time. Inferior displacement of the right cerebellar tonsil below the level of the foramen magnum by 4 mm. 2. Moderate-sized focus of vasogenic edema within the left parietal lobe suspicious for an additional site of parenchymal metastatic disease. A brain MRI with contrast is recommended for further evaluation of the lesions within the left parietal lobe and right cerebellar hemisphere, and to better assess for any additional intracranial metastatic disease. 3. Mild chronic small vessel image changes within the cerebral white matter. 4. Paranasal sinus disease, as described. 5. Right frontotemporal scalp/periorbital hematoma. CT cervical spine: 1. No evidence of acute fracture to the cervical spine. 2. Mild cervicothoracic dextrocurvature. 3. Mild grade 1 retrolisthesis at C3-C4, C4-C5 and C5-C6. 4. Subcentimeter  sclerotic foci T1 vertebral body. Although nonspecific, sites of osseous metastatic disease cannot be excluded. A nonemergent thoracic spine MRI with contrast may be helpful for further characterization. 5. Cervical spondylosis, as described. Electronically Signed   By: Kellie Simmering D.O.   On: 05/23/2021 13:11   MR BRAIN W WO CONTRAST  Result Date: 05/23/2021 CLINICAL DATA:  Brain metastasis suspected. Fall with head trauma. Abnormal head CT. EXAM: MRI HEAD WITHOUT AND WITH CONTRAST TECHNIQUE: Multiplanar, multiecho pulse sequences of the brain and surrounding structures were obtained without and with intravenous contrast. CONTRAST:  66mL GADAVIST GADOBUTROL 1 MMOL/ML IV SOLN COMPARISON:  Head CT earlier same day. FINDINGS: Brain: Multiple brain masses. In the right lateral cerebellum, there is a partially necrotic metastasis measuring 3.7 cm in diameter with surrounding edema and mass effect. This causes marked narrowing of the fourth ventricle. 2 mm metastasis in the left cerebellum axial image 6. 7 mm metastasis in the left frontal lobe axial image 33. Possible 2 mm metastasis just anterior to that. 13 mm metastasis in the left parietal vertex with regional vasogenic edema. Elsewhere, there chronic small-vessel ischemic changes affecting the pons and cerebral hemispheric white matter. No obstructive hydrocephalus at this moment. No extra-axial collection. Vascular: Major vessels at the base of the brain show flow. Skull and upper cervical spine: Negative Sinuses/Orbits: Mucosal inflammatory changes of the paranasal sinuses. Fluid levels in the right frontal sinus per orbits negative Other: Right frontal scalp sebaceous cyst. IMPRESSION: Multiple brain metastases. Two cerebellar metastases, the larger in the right lateral hemisphere measuring up to 3.7 cm in size with pronounced associated vasogenic edema and mass effect, resulting in narrowing of the fourth ventricle but apparently without complete ventricular  obstruction at this moment. 2 mm metastasis in the left cerebellum. Two definite metastases in the left cerebral hemisphere in the left posterior frontal region and at the left parietal vertex measuring 7 mm and 13 mm respectively. Mild vasogenic edema surrounding the parietal metastasis. Possible additional 2 mm metastasis just anterior to the left frontal lesion. Paranasal sinusitis, most pronounced affecting the right frontal sinus. Electronically Signed   By: Nelson Chimes M.D.   On: 05/23/2021 15:42    Procedures Procedures    Medications Ordered in ED Medications  dexamethasone (DECADRON) injection 10 mg (has no administration in time range)  oxyCODONE (Oxy IR/ROXICODONE) immediate release tablet 5 mg (has no administration in time range)  atenolol (TENORMIN) tablet 50 mg (50 mg Oral Not Given 05/23/21 1628)  doxazosin (CARDURA) tablet 4 mg (4 mg Oral Not Given 05/23/21 1628)  amLODipine (NORVASC) tablet 10 mg (10 mg Oral Not Given 05/23/21 1627)  LORazepam (ATIVAN) tablet 0.5 mg (has no administration in time range)  oxybutynin (DITROPAN) tablet 5 mg (has  no administration in time range)  rivaroxaban (XARELTO) tablet 20 mg (has no administration in time range)  sodium chloride flush (NS) 0.9 % injection 3 mL (has no administration in time range)  acetaminophen (TYLENOL) tablet 650 mg (has no administration in time range)    Or  acetaminophen (TYLENOL) suppository 650 mg (has no administration in time range)  morphine 2 MG/ML injection 2 mg (has no administration in time range)  docusate sodium (COLACE) capsule 100 mg (has no administration in time range)  polyethylene glycol (MIRALAX / GLYCOLAX) packet 17 g (has no administration in time range)  bisacodyl (DULCOLAX) EC tablet 5 mg (has no administration in time range)  ondansetron (ZOFRAN) tablet 4 mg (has no administration in time range)    Or  ondansetron (ZOFRAN) injection 4 mg (has no administration in time range)  albuterol  (PROVENTIL) (2.5 MG/3ML) 0.083% nebulizer solution 2.5 mg (has no administration in time range)  hydrALAZINE (APRESOLINE) injection 5 mg (has no administration in time range)  dexamethasone (DECADRON) injection 10 mg (10 mg Intravenous Given 05/23/21 1408)  gadobutrol (GADAVIST) 1 MMOL/ML injection 5 mL (5 mLs Intravenous Contrast Given 05/23/21 1519)    ED Course/ Medical Decision Making/ A&P Clinical Course as of 05/23/21 1745  Wed May 23, 2021  1345 Discussed with Dr. Burr Medico oncology.  She felt steroids would be indicated and patient should be admitted to the hospital until she is more symptomatically improved. [MB]    Clinical Course User Index [MB] Hayden Rasmussen, MD                           Medical Decision Making  This patient complains of sinus unsteadiness nausea vomiting after a fall; this involves an extensive number of treatment Options and is a complaint that carries with it a high risk of complications and Morbidity. The differential includes bleed, stroke, mass, dehydration, metabolic derangement  I ordered, reviewed and interpreted labs, which included CBC with normal white count, hemoglobin low better than priors, chemistries with low sodium low chloride elevated glucose, COVID and flu negative I ordered medication IV Decadron for patient's vasogenic edema brain I ordered imaging studies which included CT head, CT cervical spine and MRI brain and I independently    visualized and interpreted imaging which showed multiple lesions in brain concerning for metastases Additional history obtained from patient's daughter Previous records obtained and reviewed in epic including prior oncology notes I consulted Dr. Annamaria Boots oncology and [facility] hospitalist Dr. Lorin Mercy and discussed lab and imaging findings  Critical Interventions: None  After the interventions stated above, I reevaluated the patient and found patient to be reasonably comfortable.  Discussed concerns for imaging and need  for hospitalization for further work-up.  Patient agreeable to admission.        Final Clinical Impression(s) / ED Diagnoses Final diagnoses:  Brain lesion  Injury of head, initial encounter  Malignant neoplasm of uterus, unspecified site [facility])    Rx / DC Orders ED Discharge Orders     None         Hayden Rasmussen, MD 05/23/21 [phone]

## 2021-05-23 NOTE — Telephone Encounter (Signed)
OK I will check the chart 

## 2021-05-23 NOTE — Telephone Encounter (Signed)
Returned her call. She fell on Saturday and hit her head on the tub. She has a black eye and large lump to the right side of her temple. She is concerned that she may have hurt herself more than she thought as first. She has been having vomiting daily x2.  Instructed to go to the ER at [facility] to be evaluated now. She verbalized understanding and will have her daughter take her to the ER.

## 2021-05-23 NOTE — Assessment & Plan Note (Addendum)
-  On 02/2021 imaging she appeared to have ongoing progressive disease -She has been treated with Lenvatinib + pembrolizumab but these have been on hold since mid-December due to COVID infection -Oncology has been consulted -Consider palliative care consult

## 2021-05-23 NOTE — ED Notes (Signed)
Lunch tray ordered 

## 2021-05-23 NOTE — H&P (Signed)
History and Physical    Patient: Teresa Oliver PPI:[id number] DOB: February 25, 1946 DOA: 05/23/2021 DOS: the patient was seen and examined on 05/23/2021 PCP: [facility] [place]  Patient coming from: Home - lives with husband and son; [place]: husband and children, [phone]  Chief Complaint: Fall  HPI: Teresa Oliver is a 76 y.o. female with medical history significant of HTN; HLD; thalassemia; and uterine cancer presenting with fall, on Xarelto. She had a bad case of COVID for 2-3 weeks (tested positive on 12/18) and inoperable uterine cancer.  COVID made her very dizzy and she had a fall trying to get into the bathroom and hit her head on the bathtub on 12/31.  She has been having continued dizziness, ataxia, vomiting.  Every time she moves she vomits.  No visual changes.    She had uterine cancer and it was treated with chemotherapy. It came back after treatment.  Her only choice was [place]. She was worried about BP (HTN with proteinuria) and they stopped the chemo and then she got COVID.  Last treatment was prior to [place].  She was supposed to have repeat scans but they got delayed due to [place].  She has no abdominal pain. Her family was mostly concerned about the dizziness and vomiting.     ER Course:  Likely mets in her brain.  Has uterine cancer, sees Dr. Alvy Bimler.  Had [place] recently, stopped chemo agents.  Fell 5 days ago and hit head.  More dizzy, unsteady, +vomiting.  Dr. Burr Medico is covering, needs steroids and MRI.  Oncology will see tomorrow.   Review of Systems: As mentioned in the history of present illness. All other systems reviewed and are negative. Past Medical History:  Diagnosis Date   Anemia    chronic   Hyperlipemia    Hypertension    Irritable bowel syndrome (IBS)    Thalassemia    Uterine cancer [facility])    Past Surgical History:  Procedure Laterality Date   BIOPSY  08/31/2020   Procedure: BIOPSY;  Surgeon: Wilford Corner, MD;  Location: [facility]  ENDOSCOPY;  Service: Endoscopy;;   BREAST EXCISIONAL BIOPSY Left    1985 benign   COLONOSCOPY N/A 08/31/2020   Procedure: COLONOSCOPY;  Surgeon: Wilford Corner, MD;  Location: [facility] ENDOSCOPY;  Service: Endoscopy;  Laterality: N/A;   HERNIA REPAIR     IR IMAGING GUIDED PORT INSERTION  09/01/2020   SUBMUCOSAL TATTOO INJECTION  08/31/2020   Procedure: SUBMUCOSAL TATTOO INJECTION;  Surgeon: Wilford Corner, MD;  Location: [facility] ENDOSCOPY;  Service: Endoscopy;;   Social History:  reports that she has never smoked. She has never used smokeless tobacco. She reports that she does not currently use alcohol. She reports that she does not currently use drugs.  No Known Allergies  Family History  Problem Relation Age of Onset   Stomach cancer Mother    Dementia Mother    Thalassemia Mother    Heart attack Father    Prostate cancer Brother    Thalassemia Daughter     Prior to Admission medications   Medication Sig Start Date End Date Taking? Authorizing Provider  amLODipine (NORVASC) 10 MG tablet Take 1 tablet (10 mg total) by mouth daily. 12/08/20   [name], MD  atenolol (TENORMIN) 50 MG tablet TAKE 1 TABLET(50 MG) BY MOUTH DAILY 04/19/21   [name], MD  cholecalciferol (VITAMIN D3) 25 MCG (1000 UNIT) tablet Take 1,000 Units by mouth daily.    [provider]  doxazosin (CARDURA)  4 MG tablet Take 1 tablet (4 mg total) by mouth daily. 03/16/21   [name], MD  hydrALAZINE (APRESOLINE) 25 MG tablet Take 1 tablet (25 mg total) by mouth 3 (three) times daily. 04/30/21   [name], MD  lenvatinib 20 mg daily dose (LENVIMA) 2 x 10 MG capsule Take 1 capsule (10 mg total) by mouth daily. 02/20/21   [name], MD  lidocaine-prilocaine (EMLA) cream Apply 1 application topically daily as needed. 09/11/20   [name], MD  LORazepam (ATIVAN) 0.5 MG tablet TAKE 1 TABLET(0.5 MG) BY MOUTH TWICE DAILY AS NEEDED FOR ANXIETY 12/29/20   [name], MD  losartan (COZAAR) 50 MG tablet Take 1 tablet  (50 mg total) by mouth daily. 02/23/21   [name], MD  MAGNESIUM-OXIDE 400 (240 Mg) MG tablet TAKE 1 TABLET(400 MG) BY MOUTH TWICE DAILY 04/25/21   [name], MD  ondansetron (ZOFRAN) 8 MG tablet Take 1 tablet (8 mg total) by mouth every 8 (eight) hours as needed for nausea. 09/11/20   [name], MD  oxybutynin (DITROPAN) 5 MG tablet Take 1 tablet (5 mg total) by mouth every 8 (eight) hours as needed for bladder spasms. 09/04/20   Dana Allan I, MD  oxyCODONE (OXY IR/ROXICODONE) 5 MG immediate release tablet Take 1 tablet (5 mg total) by mouth every 4 (four) hours as needed for severe pain. 02/19/21   [name], MD  polyethylene glycol (MIRALAX / GLYCOLAX) 17 g packet Take 17 g by mouth 2 (two) times daily as needed for moderate constipation. 09/04/20   Bonnell Public, MD  prochlorperazine (COMPAZINE) 10 MG tablet Take 1 tablet (10 mg total) by mouth every 6 (six) hours as needed for nausea or vomiting. 09/11/20   [name], MD  rivaroxaban (XARELTO) 20 MG TABS tablet Take 1 tablet (20 mg total) by mouth daily with supper. 03/13/21   [name], MD  senna-docusate (SENOKOT-S) 8.6-50 MG tablet Take 1 tablet by mouth 2 (two) times daily. 09/04/20   Bonnell Public, MD    Physical Exam: Vitals:   05/23/21 1223 05/23/21 1330 05/23/21 1400 05/23/21 1625  BP: 112/82 (!) 152/85 140/82 (!) 164/86  Pulse: 87 79 81 83  Resp:  17 19 15   Temp: 97.8 F (36.6 C)     TempSrc: Oral     SpO2: 100% 98% 98% 100%   General:  Appears calm and comfortable and is in NAD; large R temporal hematoma with surrounding periorbital ecchymosis Eyes:  PERRL, EOMI, iris, R periorbital ecchymosis with eyelid edema ENT:  grossly normal hearing, lips & tongue, mmm; appropriate dentition Neck:  no LAD, masses or thyromegaly Cardiovascular:  RRR, no m/r/g. No LE edema.  Respiratory:   CTA bilaterally with no wheezes/rales/rhonchi.  Normal respiratory effort. Abdomen:  soft, NT, ND Skin:  no rash or  induration seen on limited exam; R head superficial injuries from fall Musculoskeletal:  grossly normal tone BUE/BLE, good ROM, no bony abnormality Psychiatric:  blunted mood and affect, speech fluent and appropriate, AOx3 Neurologic:  CN 2-12 grossly intact, moves all extremities in coordinated fashion, +ataxia, particularly with finger to nose testing   Radiological Exams on Admission: Independently reviewed - see discussion in A/P where applicable  CT Head Wo Contrast  Result Date: 05/23/2021 CLINICAL DATA:  Provided history: Head trauma, penetrating. Additional history provided: Recent fall, on Xarelto, nausea/vomiting. EXAM: CT HEAD WITHOUT CONTRAST CT CERVICAL SPINE WITHOUT CONTRAST TECHNIQUE: Multidetector CT imaging of the head and cervical spine was  performed following the standard protocol without intravenous contrast. Multiplanar CT image reconstructions of the cervical spine were also generated. COMPARISON:  None. FINDINGS: CT HEAD FINDINGS Brain: Cerebral volume appears normal for age. There is prominent vasogenic edema within the right cerebellar hemisphere, highly suspicious for underlying metastatic lesion(s) at this site. Associated posterior fossa mass effect with partial effacement of the fourth ventricle and likely some mass effect upon the dorsal pons. No evidence of obstructive hydrocephalus at this time. The right cerebellar tonsil is displaced inferiorly below the level of foramen magnum by 4 mm. Moderate-sized focus of vasogenic edema within the left parietal lobe, suspicious for an additional site of parenchymal metastatic disease (for instance as seen on series 3, image 24). Background mild patchy and ill-defined hypoattenuation within the cerebral white matter, nonspecific but compatible chronic small vessel ischemic disease. Superimposed additional sites of mild vasogenic edema (an additional metastatic lesions) are difficult to exclude by noncontrast CT. No acute intracranial  hemorrhage is identified. No acute demarcated cortical infarct. No extra-axial fluid collection. No supratentorial midline shift. Vascular: No hyperdense vessel.  Atherosclerotic calcifications. Skull: Normal. Negative for fracture or focal lesion. Sinuses/Orbits: Visualized orbits show no acute finding. Extensive partial opacification of the right frontal sinus due to the presence of fluid and mucosal thickening. Mild mucosal thickening and possible fluid within the left frontal sinus. Scattered mucosal thickening and fluid within the bilateral ethmoid sinuses. Moderate mucosal thickening within the right maxillary sinus. Mild mucosal thickening within the left maxillary sinus. Other: Right frontotemporal scalp/periorbital hematoma. CT CERVICAL SPINE FINDINGS Alignment: Mild cervicothoracic dextrocurvature. Trace grade 1 retrolisthesis at C3-C4, C4-C5 and C5-C6. Skull base and vertebrae: The basion-dental and atlanto-dental intervals are maintained.No evidence of acute fracture to the cervical spine. Nonspecific subcentimeter sclerotic foci within the T1 vertebral body. Soft tissues and spinal canal: No prevertebral fluid or swelling. No visible canal hematoma. Subcentimeter nodule within the left thyroid lobe. Disc levels: Cervical spondylosis with multilevel disc space narrowing, disc bulges/central disc protrusions, endplate spurring and uncovertebral hypertrophy. Disc space narrowing is greatest at C4-C5 (moderate at this level). No appreciable high-grade spinal canal stenosis. Multilevel bony neural foraminal narrowing. Upper chest: No consolidation within the imaged lung apices. No visible pneumothorax. Partially imaged right chest infusion port catheter. CT head impressions #1 and #2 called by telephone at the time of interpretation on 05/23/2021 at 1:09 pm to provider Dr. Melina Copa, who verbally acknowledged these results. IMPRESSION: CT head: 1. Prominent vasogenic edema within the right cerebellar hemisphere,  highly suspicious for underlying metastatic lesion(s) at this site. Associated cerebellar swelling with partial effacement of the fourth ventricle, and likely some mass effect upon the dorsal pons. No evidence of obstructive hydrocephalus at this time. Inferior displacement of the right cerebellar tonsil below the level of the foramen magnum by 4 mm. 2. Moderate-sized focus of vasogenic edema within the left parietal lobe suspicious for an additional site of parenchymal metastatic disease. A brain MRI with contrast is recommended for further evaluation of the lesions within the left parietal lobe and right cerebellar hemisphere, and to better assess for any additional intracranial metastatic disease. 3. Mild chronic small vessel image changes within the cerebral white matter. 4. Paranasal sinus disease, as described. 5. Right frontotemporal scalp/periorbital hematoma. CT cervical spine: 1. No evidence of acute fracture to the cervical spine. 2. Mild cervicothoracic dextrocurvature. 3. Mild grade 1 retrolisthesis at C3-C4, C4-C5 and C5-C6. 4. Subcentimeter sclerotic foci T1 vertebral body. Although nonspecific, sites of osseous metastatic disease cannot be  excluded. A nonemergent thoracic spine MRI with contrast may be helpful for further characterization. 5. Cervical spondylosis, as described. Electronically Signed   By: Kellie Simmering D.O.   On: 05/23/2021 13:11   CT Cervical Spine Wo Contrast  Result Date: 05/23/2021 CLINICAL DATA:  Provided history: Head trauma, penetrating. Additional history provided: Recent fall, on Xarelto, nausea/vomiting. EXAM: CT HEAD WITHOUT CONTRAST CT CERVICAL SPINE WITHOUT CONTRAST TECHNIQUE: Multidetector CT imaging of the head and cervical spine was performed following the standard protocol without intravenous contrast. Multiplanar CT image reconstructions of the cervical spine were also generated. COMPARISON:  None. FINDINGS: CT HEAD FINDINGS Brain: Cerebral volume appears normal  for age. There is prominent vasogenic edema within the right cerebellar hemisphere, highly suspicious for underlying metastatic lesion(s) at this site. Associated posterior fossa mass effect with partial effacement of the fourth ventricle and likely some mass effect upon the dorsal pons. No evidence of obstructive hydrocephalus at this time. The right cerebellar tonsil is displaced inferiorly below the level of foramen magnum by 4 mm. Moderate-sized focus of vasogenic edema within the left parietal lobe, suspicious for an additional site of parenchymal metastatic disease (for instance as seen on series 3, image 24). Background mild patchy and ill-defined hypoattenuation within the cerebral white matter, nonspecific but compatible chronic small vessel ischemic disease. Superimposed additional sites of mild vasogenic edema (an additional metastatic lesions) are difficult to exclude by noncontrast CT. No acute intracranial hemorrhage is identified. No acute demarcated cortical infarct. No extra-axial fluid collection. No supratentorial midline shift. Vascular: No hyperdense vessel.  Atherosclerotic calcifications. Skull: Normal. Negative for fracture or focal lesion. Sinuses/Orbits: Visualized orbits show no acute finding. Extensive partial opacification of the right frontal sinus due to the presence of fluid and mucosal thickening. Mild mucosal thickening and possible fluid within the left frontal sinus. Scattered mucosal thickening and fluid within the bilateral ethmoid sinuses. Moderate mucosal thickening within the right maxillary sinus. Mild mucosal thickening within the left maxillary sinus. Other: Right frontotemporal scalp/periorbital hematoma. CT CERVICAL SPINE FINDINGS Alignment: Mild cervicothoracic dextrocurvature. Trace grade 1 retrolisthesis at C3-C4, C4-C5 and C5-C6. Skull base and vertebrae: The basion-dental and atlanto-dental intervals are maintained.No evidence of acute fracture to the cervical  spine. Nonspecific subcentimeter sclerotic foci within the T1 vertebral body. Soft tissues and spinal canal: No prevertebral fluid or swelling. No visible canal hematoma. Subcentimeter nodule within the left thyroid lobe. Disc levels: Cervical spondylosis with multilevel disc space narrowing, disc bulges/central disc protrusions, endplate spurring and uncovertebral hypertrophy. Disc space narrowing is greatest at C4-C5 (moderate at this level). No appreciable high-grade spinal canal stenosis. Multilevel bony neural foraminal narrowing. Upper chest: No consolidation within the imaged lung apices. No visible pneumothorax. Partially imaged right chest infusion port catheter. CT head impressions #1 and #2 called by telephone at the time of interpretation on 05/23/2021 at 1:09 pm to provider Dr. Melina Copa, who verbally acknowledged these results. IMPRESSION: CT head: 1. Prominent vasogenic edema within the right cerebellar hemisphere, highly suspicious for underlying metastatic lesion(s) at this site. Associated cerebellar swelling with partial effacement of the fourth ventricle, and likely some mass effect upon the dorsal pons. No evidence of obstructive hydrocephalus at this time. Inferior displacement of the right cerebellar tonsil below the level of the foramen magnum by 4 mm. 2. Moderate-sized focus of vasogenic edema within the left parietal lobe suspicious for an additional site of parenchymal metastatic disease. A brain MRI with contrast is recommended for further evaluation of the lesions within the left parietal  lobe and right cerebellar hemisphere, and to better assess for any additional intracranial metastatic disease. 3. Mild chronic small vessel image changes within the cerebral white matter. 4. Paranasal sinus disease, as described. 5. Right frontotemporal scalp/periorbital hematoma. CT cervical spine: 1. No evidence of acute fracture to the cervical spine. 2. Mild cervicothoracic dextrocurvature. 3. Mild grade  1 retrolisthesis at C3-C4, C4-C5 and C5-C6. 4. Subcentimeter sclerotic foci T1 vertebral body. Although nonspecific, sites of osseous metastatic disease cannot be excluded. A nonemergent thoracic spine MRI with contrast may be helpful for further characterization. 5. Cervical spondylosis, as described. Electronically Signed   By: Kellie Simmering D.O.   On: 05/23/2021 13:11   MR BRAIN W WO CONTRAST  Result Date: 05/23/2021 CLINICAL DATA:  Brain metastasis suspected. Fall with head trauma. Abnormal head CT. EXAM: MRI HEAD WITHOUT AND WITH CONTRAST TECHNIQUE: Multiplanar, multiecho pulse sequences of the brain and surrounding structures were obtained without and with intravenous contrast. CONTRAST:  25mL GADAVIST GADOBUTROL 1 MMOL/ML IV SOLN COMPARISON:  Head CT earlier same day. FINDINGS: Brain: Multiple brain masses. In the right lateral cerebellum, there is a partially necrotic metastasis measuring 3.7 cm in diameter with surrounding edema and mass effect. This causes marked narrowing of the fourth ventricle. 2 mm metastasis in the left cerebellum axial image 6. 7 mm metastasis in the left frontal lobe axial image 33. Possible 2 mm metastasis just anterior to that. 13 mm metastasis in the left parietal vertex with regional vasogenic edema. Elsewhere, there chronic small-vessel ischemic changes affecting the pons and cerebral hemispheric white matter. No obstructive hydrocephalus at this moment. No extra-axial collection. Vascular: Major vessels at the base of the brain show flow. Skull and upper cervical spine: Negative Sinuses/Orbits: Mucosal inflammatory changes of the paranasal sinuses. Fluid levels in the right frontal sinus per orbits negative Other: Right frontal scalp sebaceous cyst. IMPRESSION: Multiple brain metastases. Two cerebellar metastases, the larger in the right lateral hemisphere measuring up to 3.7 cm in size with pronounced associated vasogenic edema and mass effect, resulting in narrowing of the  fourth ventricle but apparently without complete ventricular obstruction at this moment. 2 mm metastasis in the left cerebellum. Two definite metastases in the left cerebral hemisphere in the left posterior frontal region and at the left parietal vertex measuring 7 mm and 13 mm respectively. Mild vasogenic edema surrounding the parietal metastasis. Possible additional 2 mm metastasis just anterior to the left frontal lesion. Paranasal sinusitis, most pronounced affecting the right frontal sinus. Electronically Signed   By: Nelson Chimes M.D.   On: 05/23/2021 15:42    EKG: not done   Labs on Admission: I have personally reviewed the available labs and imaging studies at the time of the admission.  Pertinent labs:    Na++ 125 Glucose 138 BUN 20/Creatinine 1.06/GFR 55 - stable WBC 5.3 Hgb 10.7     Assessment/Plan * Brain metastases (HCC)- (present on admission) -Patient with known h/o Stage IV uterine cancer presenting with dizziness and ataxia -She attributed this to recent COVID infection -CT with concern for brain mets, MRI confirms -She has multiple mets with edema, is at risk for cerebellar herniation as well as complete ventricular obstruction -Will admit -Decadron 10 mg IV daily -Oncology consult -Will attempt to admit to [facility], as radiation might be an option  DNR (do not resuscitate)- (present on admission) -I have discussed code status with the patient and her daughter and  they are in agreement that the patient would not desire resuscitation  and would prefer to die a natural death should that situation arise. -She will need a gold out of facility DNR form at the time of discharge  Acute venous embolism and thrombosis of internal jugular veins ([place])- (present on admission) -Patient with recent VTE -Started on Xarelto -Imaging today does not show evidence of ICH -Will continue Xarelto for now  Essential hypertension- (present on admission) -She is on a number of medications  including doxazosin, atenolol, and amlodipine - which have been continued -Hydralazine was held by Dr. Alvy Bimler recently - will continue to hold -Will also hold Cozaar for now -Will cover with prn IV hydralazine if needed  CKD (chronic kidney disease), stage III ([place])- (present on admission) -Stage 3a CKD -Appears to be stable at this time -Recheck BMP in AM  Uterine cancer ([place])- (present on admission) -On 02/2021 imaging she appeared to have ongoing progressive disease -She has been treated with Lenvatinib + pembrolizumab but these have been on hold since mid-December due to COVID infection -Oncology has been consulted -Consider palliative care consult    Advance Care Planning:   Code Status: DNR   Consults: Oncology; consider palliative care  Family Communication: Daughter was present throughout the evaluation and is aware of the diagnosis  Severity of Illness: The appropriate patient status for this patient is INPATIENT. Inpatient status is judged to be reasonable and necessary in order to provide the required intensity of service to ensure the patient's safety. The patient's presenting symptoms, physical exam findings, and initial radiographic and laboratory data in the context of their chronic comorbidities is felt to place them at high risk for further clinical deterioration. Furthermore, it is not anticipated that the patient will be medically stable for discharge from the hospital within 2 midnights of admission.   * I certify that at the point of admission it is my clinical judgment that the patient will require inpatient hospital care spanning beyond 2 midnights from the point of admission due to high intensity of service, high risk for further deterioration and high frequency of surveillance required.*  Author: Karmen Bongo, MD 05/23/2021 4:43 PM  For on call review [email or web address].

## 2021-05-23 NOTE — Assessment & Plan Note (Addendum)
-  Patient with known h/o Stage IV uterine cancer presenting with dizziness and ataxia -She attributed this to recent COVID infection -CT with concern for brain mets, MRI confirms -She has multiple mets with edema, is at risk for cerebellar herniation as well as complete ventricular obstruction -Will admit -Decadron 10 mg IV daily -Oncology consult -Will attempt to admit to [facility], as radiation might be an option -Will need PT/OT consults once more medically stable

## 2021-05-23 NOTE — Assessment & Plan Note (Signed)
-  Stage 3a CKD -Appears to be stable at this time -Recheck BMP in AM

## 2021-05-23 NOTE — Assessment & Plan Note (Signed)
-  Patient with recent VTE -Started on [place] today does not show evidence of ICH -Will continue Xarelto for now

## 2021-05-23 NOTE — ED Triage Notes (Signed)
BIB [facility] after pt's family called to report pt unable to get out of bed. Per EMS pt fell and hit head on Saturday. Pt is on Xarelto 20 mg daily. Pt endorses n/v x 2-3 since Saturday.   HX: uterine cancer, HTN

## 2021-05-23 NOTE — Assessment & Plan Note (Signed)
-  She is on a number of medications including doxazosin, atenolol, and amlodipine - which have been continued -Hydralazine was held by Dr. Alvy Bimler recently - will continue to hold -Will also hold Cozaar for now -Will cover with prn IV hydralazine if needed

## 2021-05-23 NOTE — Assessment & Plan Note (Signed)
-  I have discussed code status with the patient and her daughter and  they are in agreement that the patient would not desire resuscitation and would prefer to die a natural death should that situation arise. ?-She will need a gold out of facility DNR form at the time of discharge ?

## 2021-05-24 ENCOUNTER — Ambulatory Visit
Admit: 2021-05-24 | Discharge: 2021-05-24 | Disposition: A | Discharge status: Home / Self Care | Payer: Medicare Other | Source: Ambulatory Visit | Attending: Radiation Oncology | Admitting: Radiation Oncology

## 2021-05-24 ENCOUNTER — Inpatient Hospital Stay (HOSPITAL_COMMUNITY): Payer: Medicare Other

## 2021-05-24 DIAGNOSIS — C7931 Secondary malignant neoplasm of brain: Secondary | ICD-10-CM

## 2021-05-24 LAB — BASIC METABOLIC PANEL
Anion gap: 11 (ref 5–15)
BUN: 34 mg/dL — ABNORMAL HIGH (ref 8–23)
CO2: 23 mmol/L (ref 22–32)
Calcium: 9.4 mg/dL (ref 8.9–10.3)
Chloride: 93 mmol/L — ABNORMAL LOW (ref 98–111)
Creatinine, Ser: 1.25 mg/dL — ABNORMAL HIGH (ref 0.44–1.00)
GFR, Estimated: 45 mL/min — ABNORMAL LOW (ref >60)
Glucose, Bld: 106 mg/dL — ABNORMAL HIGH (ref 70–99)
Potassium: 4.1 mmol/L (ref 3.5–5.1)
Sodium: 127 mmol/L — ABNORMAL LOW (ref 135–145)

## 2021-05-24 LAB — CBC
HCT: 31.5 % — ABNORMAL LOW (ref 36.0–46.0)
Hemoglobin: 10.2 g/dL — ABNORMAL LOW (ref 12.0–15.0)
MCH: 22.1 pg — ABNORMAL LOW (ref 26.0–34.0)
MCHC: 32.4 g/dL (ref 30.0–36.0)
MCV: 68.3 fL — ABNORMAL LOW (ref 80.0–100.0)
Platelets: 242 K/uL (ref 150–400)
RBC: 4.61 MIL/uL (ref 3.87–5.11)
RDW: 15.3 % (ref 11.5–15.5)
WBC: 5.1 K/uL (ref 4.0–10.5)
nRBC: 0 % (ref 0.0–0.2)

## 2021-05-24 LAB — OSMOLALITY, URINE: Osmolality, Ur: 189 mOsm/kg — ABNORMAL LOW (ref 300–900)

## 2021-05-24 LAB — SODIUM, URINE, RANDOM: Sodium, Ur: <10 mmol/L

## 2021-05-24 IMAGING — MR MR HEAD WO/W CM
13 series · 45 of 48 positions shown · IV contrast (gadavist)
Comparison: [DATE]

CLINICAL DATA: Metastatic disease evaluation

EXAM:
MRI HEAD WITHOUT AND WITH CONTRAST
TECHNIQUE: Multiplanar, multiecho pulse sequences of the brain and surrounding
structures were obtained without and with intravenous contrast.
CONTRAST:  5mL GADAVIST GADOBUTROL 1 MMOL/ML IV SOLN

[Series 5: T1 · sagittal · 3.5mm · 0.75mm/px · 1 of 26 slices shown (1 of 3)]
[im 1/26]
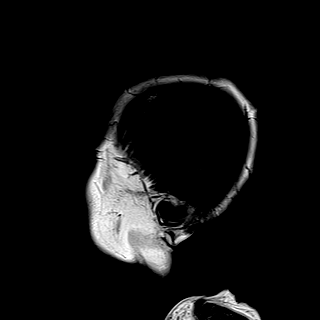

[Series 6: DWI · axial · 4.0mm · 1.94mm/px · z∈[-60,+112]mm · 3 of 86 slices shown (1 of 2)]
[im 1/86]
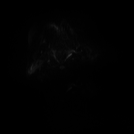
[im 43/86]
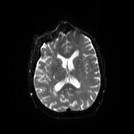
[im 86/86]
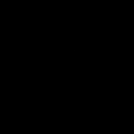

[Series 7: DWI · axial · 4.0mm · 1.94mm/px · z∈[-60,+100]mm · 2 of 41 slices shown (2 of 2)]
[im 1/41]
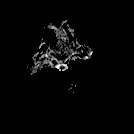
[im 41/41]
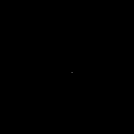

[Series 8: swi_images · axial · 3.0mm · 0.98mm/px · z∈[-106,+106]mm · 3 of 72 slices shown]
[im 1/72]
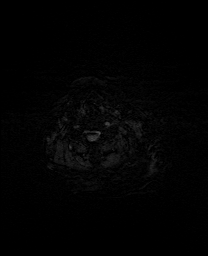
[im 36/72]
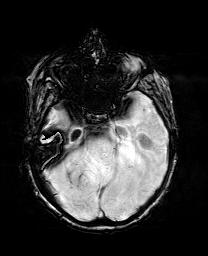
[im 72/72]
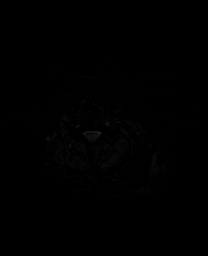

[Series 10: T2 · axial · 5.0mm · 0.65mm/px · 1 of 25 slices shown]
[im 1/25]
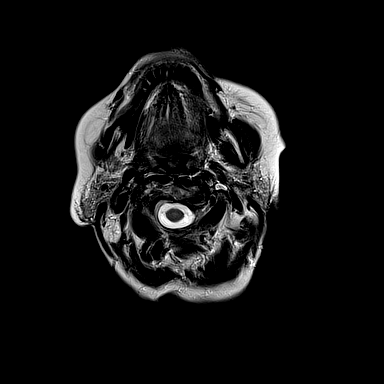

[Series 11: FLAIR · axial · 4.5mm · 0.78mm/px · z∈[-62,+114]mm · 2 of 40 slices shown (1 of 2)]
[im 1/40]
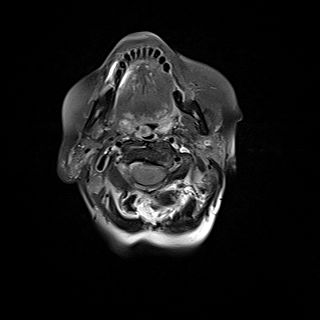
[im 40/40]
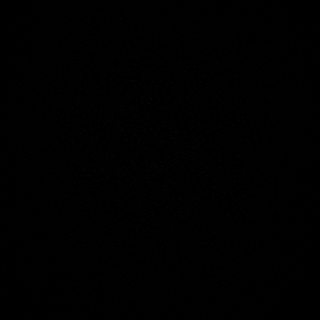

[Series 12: T1 · sagittal · 4.0mm · 0.75mm/px · 1 of 30 slices shown (2 of 3)]
[im 1/30]
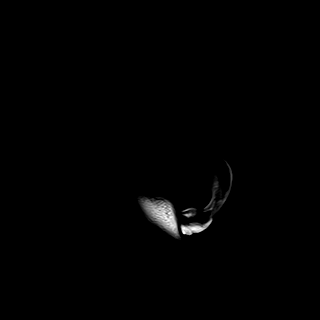

[Series 13: FLAIR · sagittal · 1.0mm · 0.53mm/px · 8 of 160 slices shown (2 of 2)]
[im 1/160]
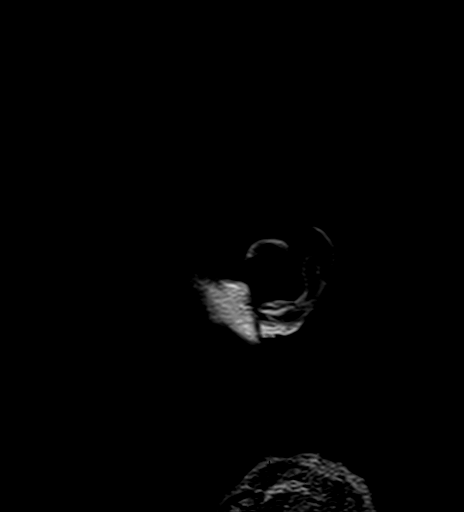
[im 23/160]
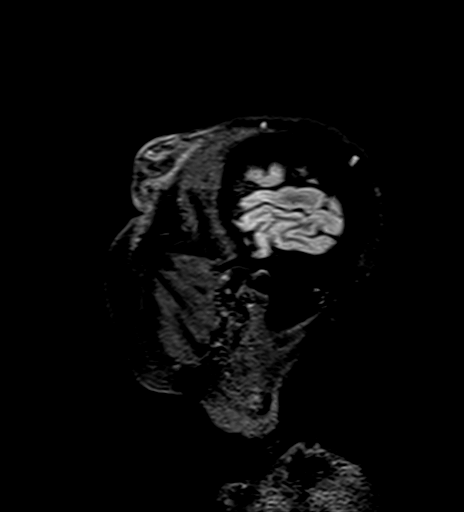
[im 46/160]
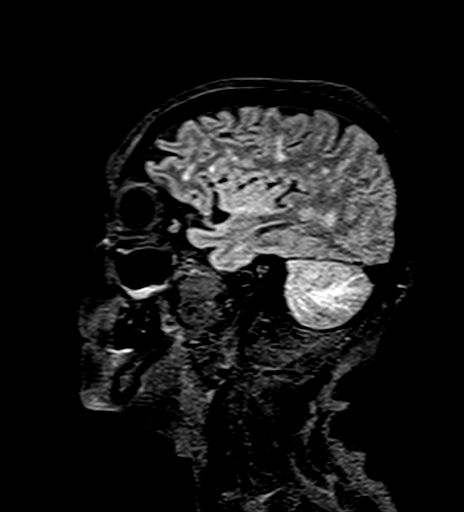
[im 69/160]
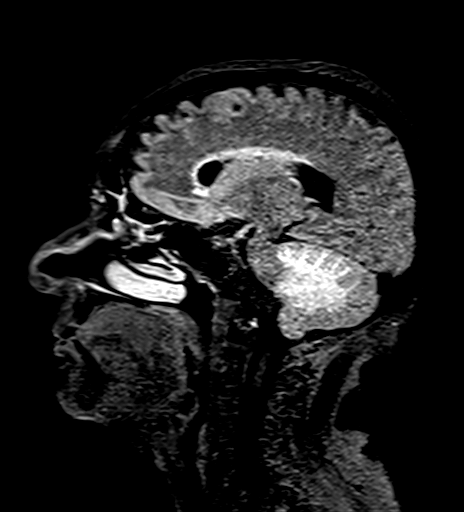
[im 91/160]
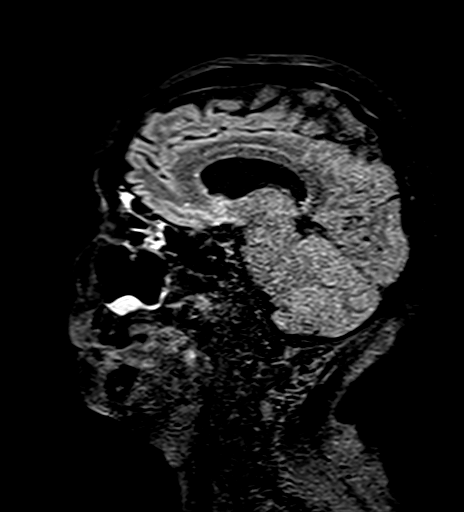
[im 114/160]
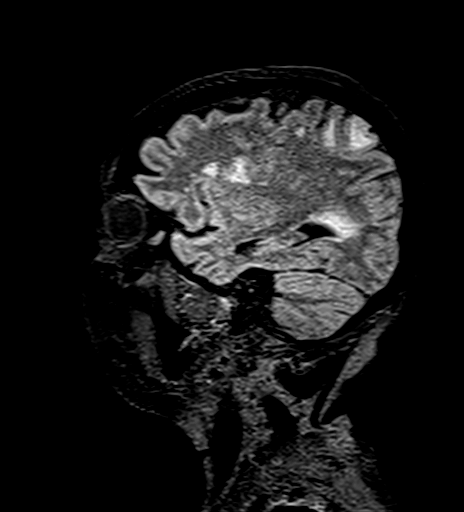
[im 137/160]
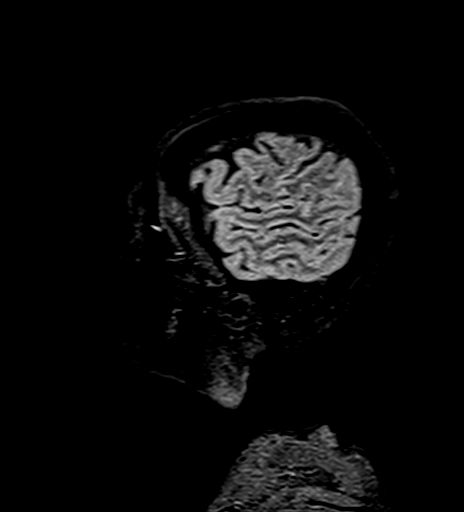
[im 160/160]
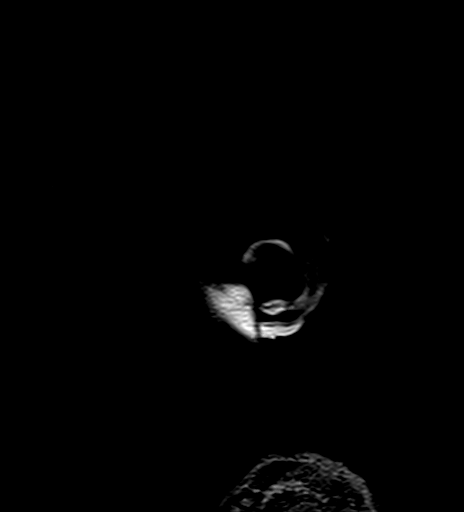

[Series 14: T1 · axial · 1.0mm · 0.98mm/px · z∈[-107,+116]mm · 8 of 224 slices shown (3 of 3)]
[im 1/224]
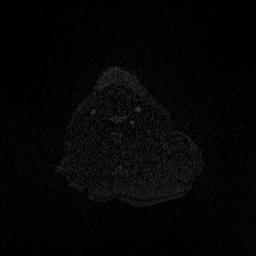
[im 45/224]
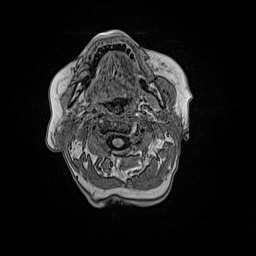
[im 67/224]
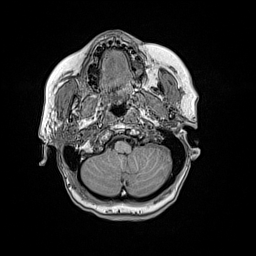
[im 90/224]
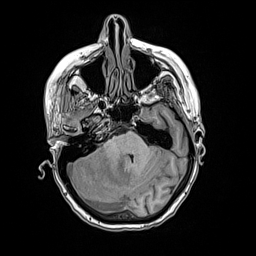
[im 134/224]
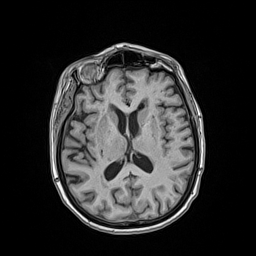
[im 157/224]
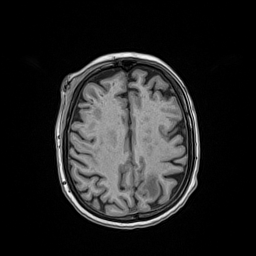
[im 179/224]
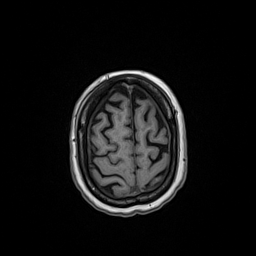
[im 224/224]
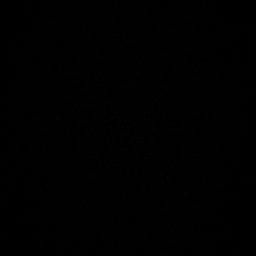

[Series 15: T2 post-contrast · coronal · 4.5mm · 0.69mm/px · 2 of 32 slices shown]
[im 1/32]
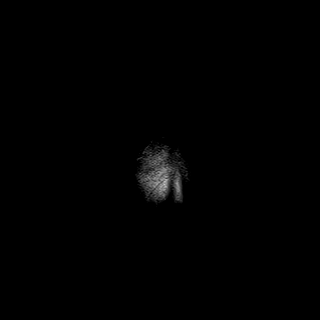
[im 32/32]
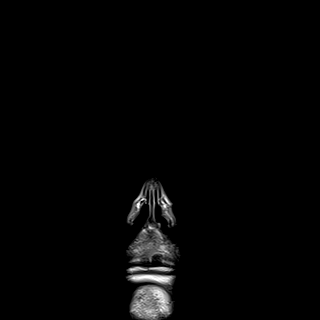

[Series 16: T1 post-contrast · axial · 1.0mm · 0.98mm/px · z∈[-107,+116]mm · 11 of 224 slices shown (1 of 3)]
[im 1/224]
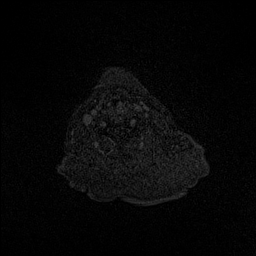
[im 23/224]
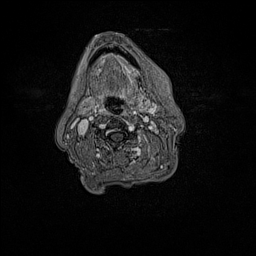
[im 45/224]
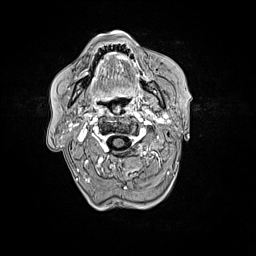
[im 67/224]
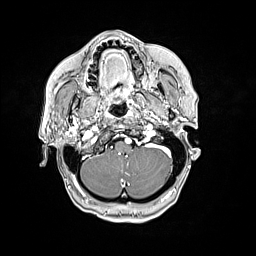
[im 90/224]
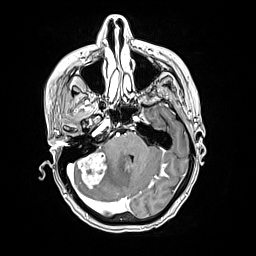
[im 112/224]
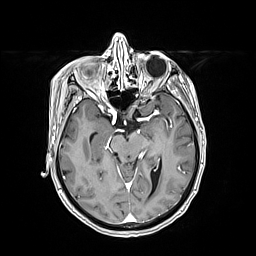
[im 134/224]
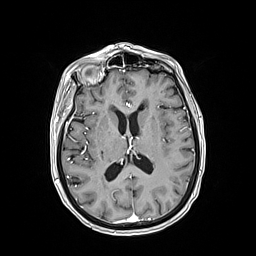
[im 157/224]
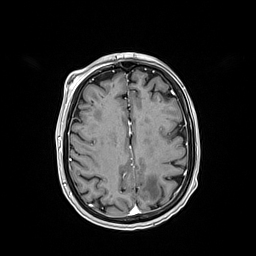
[im 179/224]
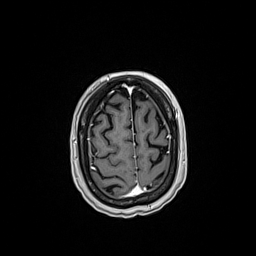
[im 201/224]
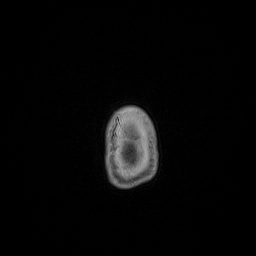
[im 224/224]
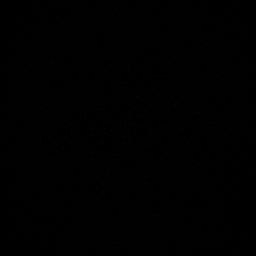

[Series 17: T1 post-contrast · coronal · 4.5mm · 0.57mm/px · 2 of 32 slices shown (2 of 3)]
[im 1/32]
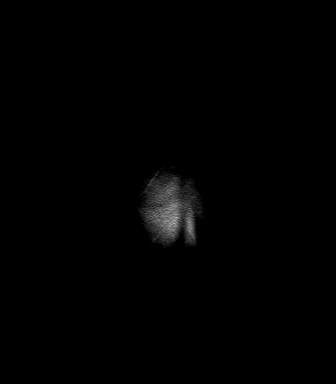
[im 32/32]
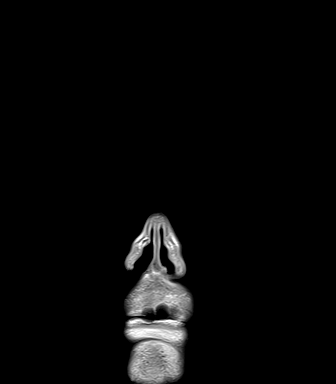

[Series 18: T1 post-contrast · sagittal · 4.0mm · 0.75mm/px · 1 of 28 slices shown (3 of 3)]
[im 1/28]
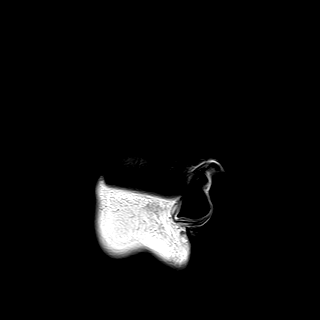

[45 of 48 positions shown; findings below may reference images not displayed]

FINDINGS: Brain: Redemonstrated multiple enhancing brain masses. Right lateral
cerebellar heterogeneously enhancing mass, which measures up to
x 2.5 x 2.6 cm (series 16, image 94 and series 17, image 12), with
significant surrounding edema, which causes mass effect on the
fourth ventricle and pons. Some hemosiderin deposition is noted
within the mass, likely intratumoral hemorrhage.

Anterior and inferior to this lesion, there is a 5 x 4 x 5 mm
enhancing lesion (series 16, image 75 and series 17, image 13),
which appears separate. Redemonstrated 2 mm left cerebellar
enhancing lesion (series 16, image 71).

Left frontal lobe lesion measures 6 x 5 x 6 mm (series 16, image 162
and series 17, image 18). No significant surrounding edema. Left
parietal mass measures 10 x 12 x 10 mm (series 16, image 168 and
series 17, image 8), with moderate surrounding edema.

No acute hemorrhage or cerebral midline shift. No hydrocephalus or
extra-axial collection. Confluent T2 hyperintense signal in the
periventricular white matter and pons, likely the sequela of severe
chronic small vessel ischemic disease.

Vascular: Normal flow voids.

Skull and upper cervical spine: Normal marrow signal.

Sinuses/Orbits: Mucous retention cyst in the left maxillary sinus.
Mild mucosal thickening in the maxillary sinuses, right frontal
sinus, and ethmoid air cells. No acute finding in the orbits. Status
post bilateral lens replacements.

Other: Right frontal scalp hematoma.
IMPRESSION: Redemonstrated enhancing masses in the bilateral cerebellar
hemispheres, left frontal lobe, and left parietal lobe. The largest
of these is in the right cerebellum and has surrounding edema and
mass effect on the fourth ventricle, without evidence of resulting
hydrocephalus.

## 2021-05-24 IMAGING — CT CT CHEST-ABD-PELV W/ CM
2 of 5 series · 12 of 36 positions shown, 14 images · IV contrast (APPLIED)
Comparison: Multiple priors most recent CT chest, abdomen and
pelvis dated [DATE]

CLINICAL DATA: Uterine/cervical cancer staging

EXAM:
CT CHEST, ABDOMEN, AND PELVIS WITH CONTRAST
TECHNIQUE: Multidetector CT imaging of the chest, abdomen and pelvis was
performed following the standard protocol during bolus
administration of intravenous contrast.
CONTRAST:  80mL OMNIPAQUE IOHEXOL 350 MG/ML SOLN

[Series 2: cap with · axial · 0.66mm/px · z∈[-555,-85]mm · 9 of 118 slices shown, 11 images]
[im 12/118  mediastinal]
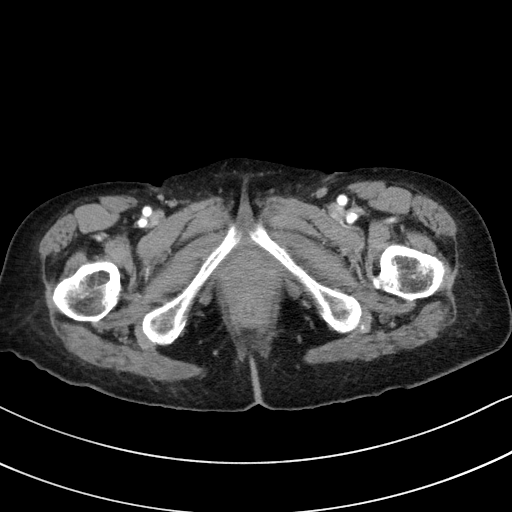
[im 12/118  bone]
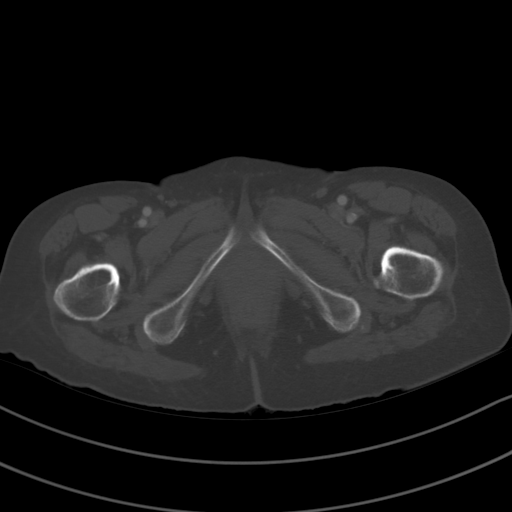
[im 24/118  mediastinal]
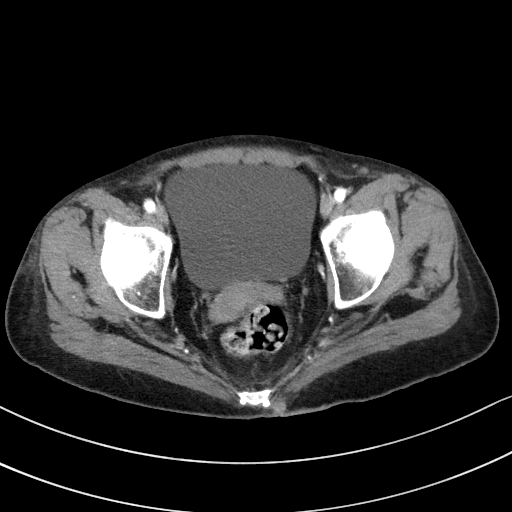
[im 36/118  mediastinal]
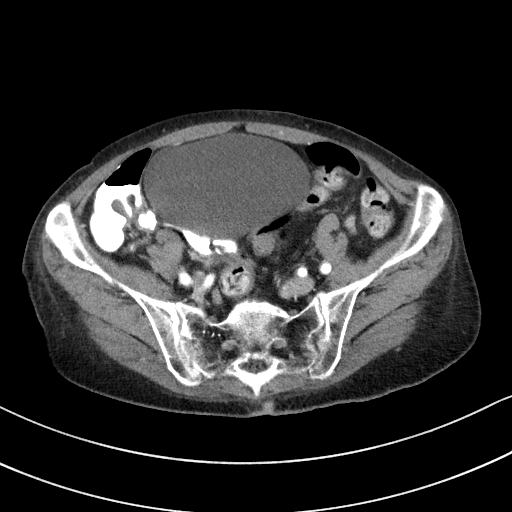
[im 47/118  mediastinal]
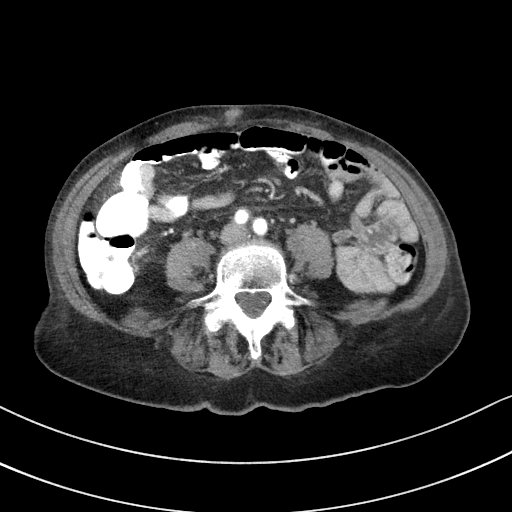
[im 59/118  mediastinal]
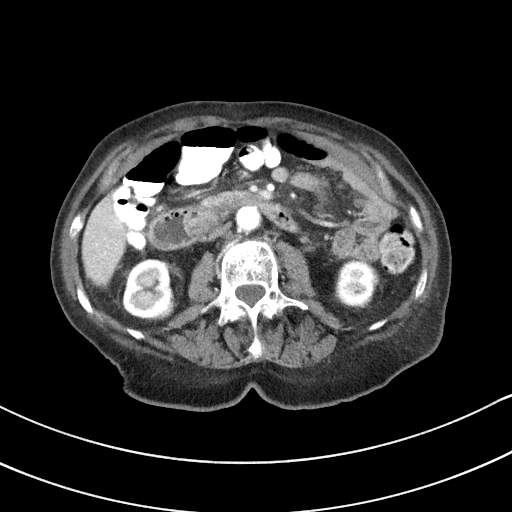
[im 71/118  mediastinal]
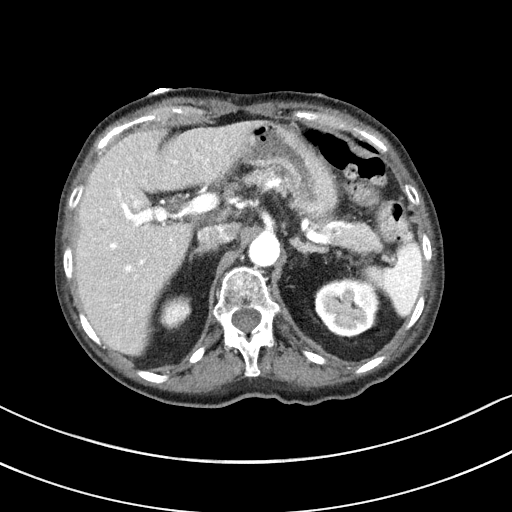
[im 82/118  mediastinal]
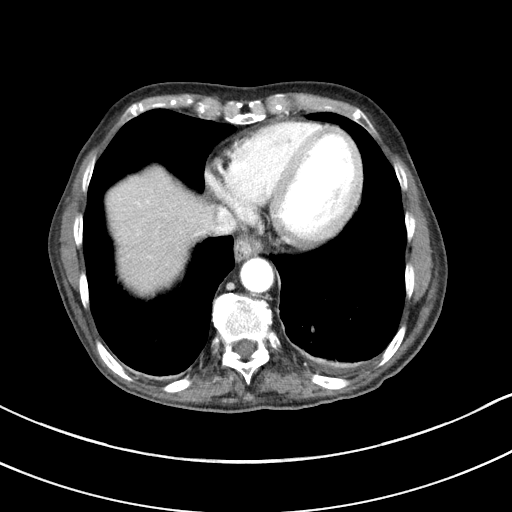
[im 94/118  mediastinal]
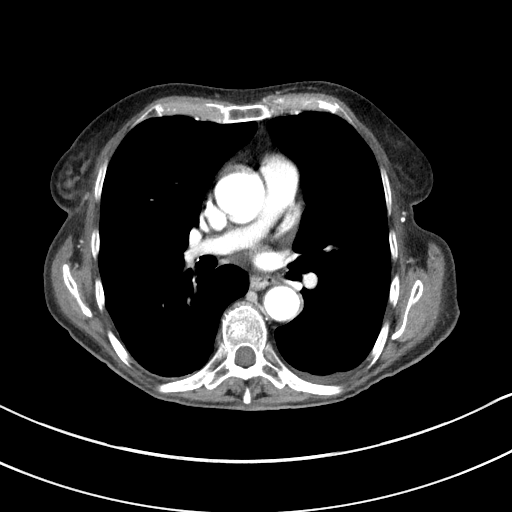
[im 106/118  mediastinal]
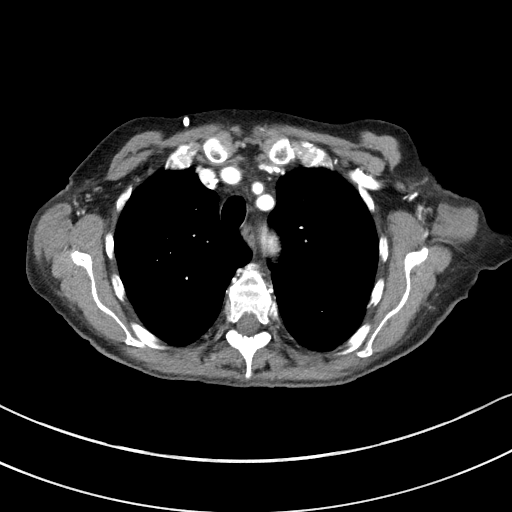
[im 106/118  bone]
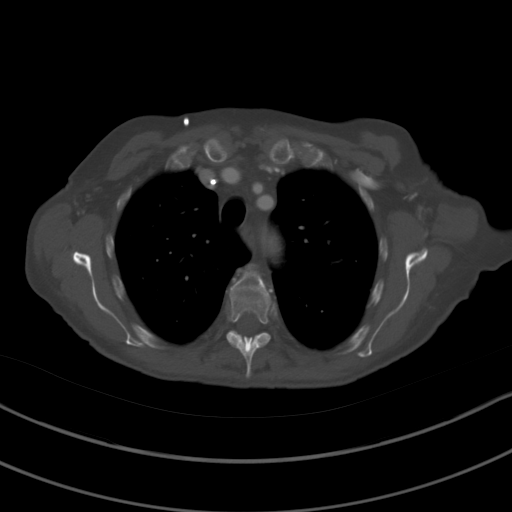

[Series 4: coronals · coronal · 0.67mm/px · 3 of 112 slices shown]
[im 23/112  mediastinal]
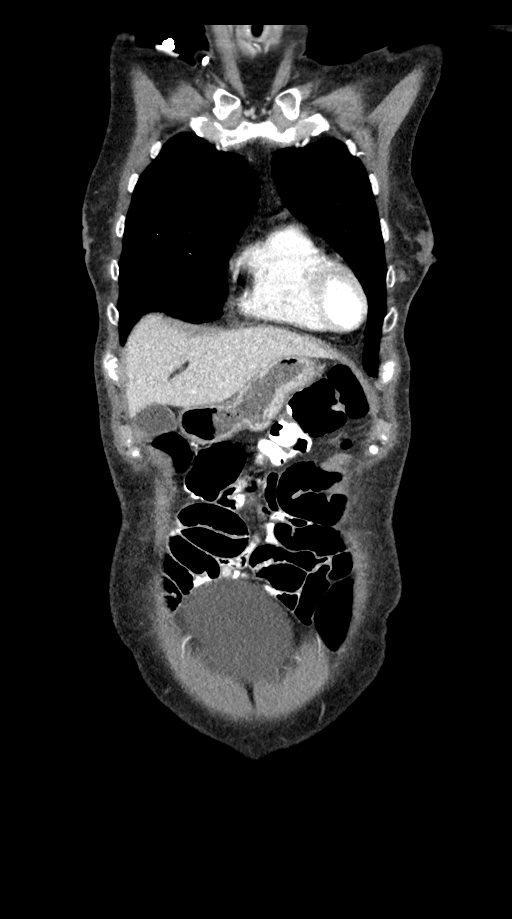
[im 45/112  mediastinal]
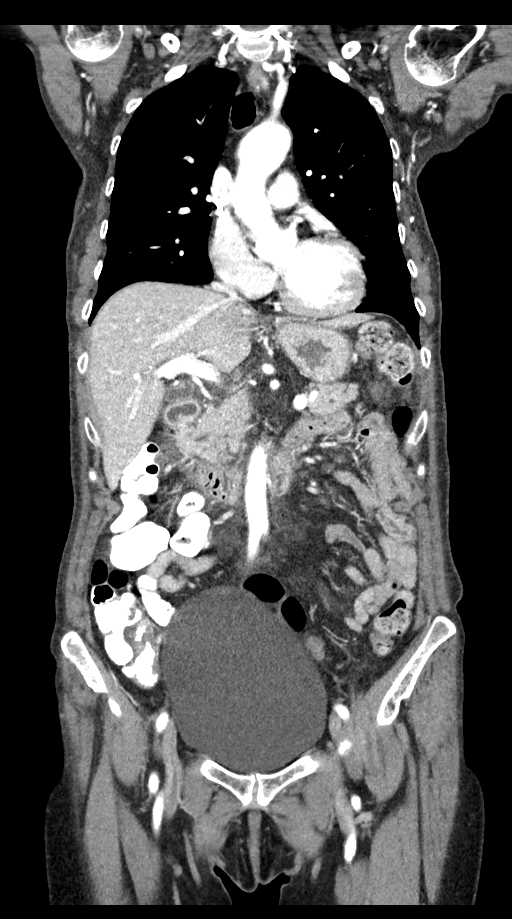
[im 67/112  mediastinal]
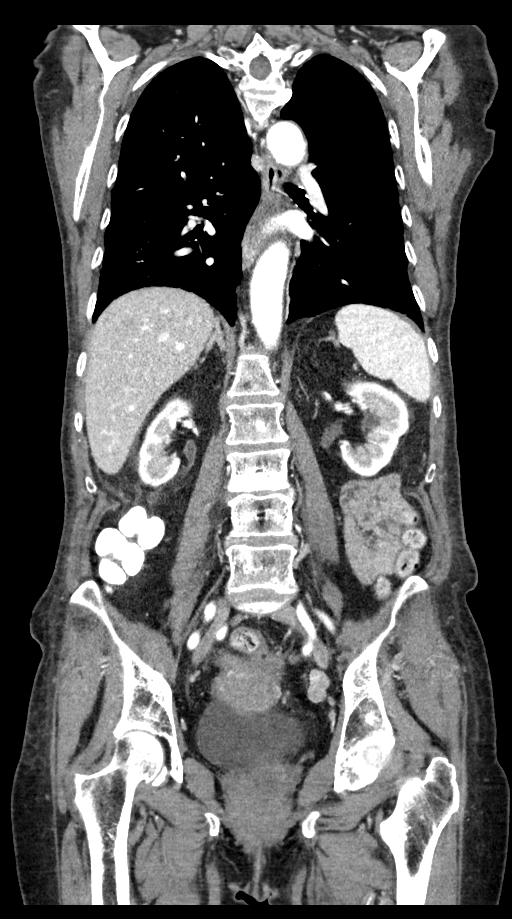

[12 of 36 positions shown; findings below may reference images not displayed]

FINDINGS: CT CHEST FINDINGS

Cardiovascular: No pericardial effusion. No definite coronary artery
calcifications. Minimal atherosclerotic disease of the thoracic
aorta. No suspicious central filling defects of the pulmonary
arteries. Right chest wall port with tip positioned near the
superior cavoatrial junction.

Mediastinum/Nodes: Enlarged left supraclavicular lymph nodes are no
longer seen. Interval decreased size left subpectoral lymph nodes.
Reference node measures 3 mm on image 16, previously 6 mm. No
pathologically enlarged lymph nodes of the chest. Small hiatal
hernia.

Lungs/Pleura: Central airways are patent. Trace bilateral pleural
effusions with associated atelectasis. No consolidation or
pneumothorax. No new or enlarging pulmonary nodules.

Musculoskeletal: Unchanged mild compression deformity of T5. No
aggressive osseous lesions seen in the region of the chest.

CT ABDOMEN PELVIS FINDINGS

Hepatobiliary: No suspicious focal liver lesions. Gallbladder is
unremarkable. No biliary ductal dilation.

Pancreas: Unremarkable. No pancreatic ductal dilatation or
surrounding inflammatory changes.

Spleen: Normal in size without focal abnormality.

Adrenals/Urinary Tract: Bilateral adrenal glands are unremarkable.
Kidneys enhance symmetrically with no evidence of hydronephrosis or
nephrolithiasis. Urinary bladder is distended with no evidence of
wall thickening.

Stomach/Bowel: Stomach is within normal limits. Appendix appears
normal. No evidence of bowel wall thickening, distention, or
inflammatory changes.

Vascular/Lymphatic: Abdominal and pelvic lymph nodes are overall
decreased in size when compared with prior exam. Reference right
pericaval lymph node measures 9 mm on series 2, image 69, previously
1.3 cm. Several previously enlarged left periaortic lymph nodes are
no longer visible. Reference right pelvic sidewall lymph node
measuring 5 mm on image 88, previously 9 mm. A few left-sided pelvic
and inguinal lymph nodes are increased in size. Left pelvic sidewall
lymph node measuring 1.3 cm on series 2, image 89, previously
cm. Left external iliac lymph node measuring 1.0 cm on image 92,
previously measured 4 mm. Left inguinal lymph node measuring 6 mm on
image 101, previously 3 mm.

Reproductive: Decreased size of irregular rim enhancing soft tissue
located between the sigmoid colon and uterus, measuring 1.6 x 1.2 cm
on series 2, image 86, previously measured up to 3.5 x 2.6 cm.
Smaller rim enhancing lesion which was previously described in the
pelvis is no longer visible.

Other: No abdominopelvic ascites.

Musculoskeletal: Interval mild compression deformity of the inferior
endplate of L3. No aggressive appearing osseous lesions seen in the
region of the abdomen or pelvis.
IMPRESSION: 1. Decreased size of pelvic soft tissue lesions.
2. Decreased size of left supraclavicular and left subpectoral lymph
nodes.
3. Overall decreased size of abdominal and pelvic lymph nodes;
however, a few left-sided pelvic and inguinal lymph nodes have
increased in size.
4. New mild compression deformity of the inferior endplate of L3.
5.  Aortic Atherosclerosis ([2O]-[2O]).

## 2021-05-24 MED ORDER — ENOXAPARIN SODIUM 40 MG/0.4ML IJ SOSY: 40.0000 mg | PREFILLED_SYRINGE | INTRAMUSCULAR | Status: DC

## 2021-05-24 MED ORDER — ENOXAPARIN SODIUM 30 MG/0.3ML IJ SOSY
30.0000 mg | PREFILLED_SYRINGE | INTRAMUSCULAR | Status: DC
  Administered 2021-05-24: 30 mg via SUBCUTANEOUS
  Filled 2021-05-24: qty 0.3

## 2021-05-24 MED ORDER — DEXAMETHASONE SODIUM PHOSPHATE 4 MG/ML IJ SOLN
4.0000 mg | Freq: Two times a day (BID) | INTRAMUSCULAR | Status: DC
  Administered 2021-05-24 – 2021-05-25 (×3): 4 mg via INTRAVENOUS
  Filled 2021-05-24 (×3): qty 1

## 2021-05-24 MED ORDER — IOHEXOL 9 MG/ML PO SOLN
ORAL | Status: AC
  Filled 2021-05-24: qty 1000

## 2021-05-24 MED ORDER — IOHEXOL 350 MG/ML SOLN
80.0000 mL | Freq: Once | INTRAVENOUS | Status: AC | PRN
  Administered 2021-05-24: 80 mL via INTRAVENOUS

## 2021-05-24 MED ORDER — LIDOCAINE-PRILOCAINE 2.5-2.5 % EX CREA: TOPICAL_CREAM | Freq: Once | CUTANEOUS | Status: DC

## 2021-05-24 NOTE — Consult Note (Signed)
Reason for Consult: New diagnosis metastatic lesion of the cerebellum and brain Referring Physician: [name] is an 76 y.o. female.  HPI: Patient is a 76 year old individual whose had a history of uterine cancer.  She presented with increasing unsteadiness on her feet and vomiting episodes.  A CT of the brain and then an MRI demonstrates presence of multifocal metastatic disease with the largest singular lesion measuring 3.7 cm in diameter in the right lateral and the sphere of the cerebellum.  There is moderate amount of mass-effect with displacement of the fourth ventricle.  The situation has been discussed with the patient radiation oncology is felt that the patient would be best served with a course of stereotactic radiosurgery followed by craniotomy for resection of the large right cerebellar lesion.  She is to have a 3T MRI of the brain.  Past Medical History:  Diagnosis Date   Anemia    chronic   Hyperlipemia    Hypertension    Irritable bowel syndrome (IBS)    Thalassemia    Uterine cancer [facility])     Past Surgical History:  Procedure Laterality Date   BIOPSY  08/31/2020   Procedure: BIOPSY;  Surgeon: Wilford Corner, MD;  Location: [facility];  Service: Endoscopy;;   BREAST EXCISIONAL BIOPSY Left    1985 benign   COLONOSCOPY N/A 08/31/2020   Procedure: COLONOSCOPY;  Surgeon: Wilford Corner, MD;  Location: [facility];  Service: Endoscopy;  Laterality: N/A;   HERNIA REPAIR     IR IMAGING GUIDED PORT INSERTION  09/01/2020   SUBMUCOSAL TATTOO INJECTION  08/31/2020   Procedure: SUBMUCOSAL TATTOO INJECTION;  Surgeon: Wilford Corner, MD;  Location: [facility];  Service: Endoscopy;;    Family History  Problem Relation Age of Onset   Stomach cancer Mother    Dementia Mother    Thalassemia Mother    Heart attack Father    Prostate cancer Brother    Thalassemia Daughter     Social History:  reports that she has never smoked. She has never used smokeless  tobacco. She reports that she does not currently use alcohol. She reports that she does not currently use drugs.  Allergies: No Known Allergies  Medications: I have reviewed the patient's current medications.  Results for orders placed or performed during the hospital encounter of 05/23/21 (from the past 48 hour(s))  CBC with Differential     Status: Abnormal   Collection Time: 05/23/21 12:29 PM  Result Value Ref Range   WBC 5.3 4.0 - 10.5 K/uL   RBC 4.84 3.87 - 5.11 MIL/uL   Hemoglobin 10.7 (L) 12.0 - 15.0 g/dL   HCT 33.5 (L) 36.0 - 46.0 %   MCV 69.2 (L) 80.0 - 100.0 fL   MCH 22.1 (L) 26.0 - 34.0 pg   MCHC 31.9 30.0 - 36.0 g/dL   RDW 15.2 11.5 - 15.5 %   Platelets 226 150 - 400 K/uL    Comment: REPEATED TO VERIFY   nRBC 0.0 0.0 - 0.2 %   Neutrophils Relative % 84 %   Neutro Abs 4.5 1.7 - 7.7 K/uL   Lymphocytes Relative 8 %   Lymphs Abs 0.4 (L) 0.7 - 4.0 K/uL   Monocytes Relative 7 %   Monocytes Absolute 0.4 0.1 - 1.0 K/uL   Eosinophils Relative 0 %   Eosinophils Absolute 0.0 0.0 - 0.5 K/uL   Basophils Relative 1 %   Basophils Absolute 0.1 0.0 - 0.1 K/uL   nRBC 0 0 /  100 WBC   Abs Immature Granulocytes 0.00 0.00 - 0.07 K/uL   Ovalocytes PRESENT     Comment: Performed at [facility], [place] [address] [place], [place] [address]  Comprehensive metabolic panel     Status: Abnormal   Collection Time: 05/23/21 12:29 PM  Result Value Ref Range   Sodium 125 (L) 135 - 145 mmol/L   Potassium 3.7 3.5 - 5.1 mmol/L   Chloride 90 (L) 98 - 111 mmol/L   CO2 22 22 - 32 mmol/L   Glucose, Bld 138 (H) 70 - 99 mg/dL    Comment: Glucose reference range applies only to samples taken after fasting for at least 8 hours.   BUN 20 8 - 23 mg/dL   Creatinine, Ser 1.06 (H) 0.44 - 1.00 mg/dL   Calcium 9.6 8.9 - 10.3 mg/dL   Total Protein 6.9 6.5 - 8.1 g/dL   Albumin 3.6 3.5 - 5.0 g/dL   AST 17 15 - 41 U/L   ALT 10 0 - 44 U/L   Alkaline Phosphatase 77 38 - 126 U/L   Total Bilirubin 0.7 0.3 -  1.2 mg/dL   GFR, Estimated 55 (L) >60 mL/min    Comment: (NOTE) Calculated using the CKD-EPI Creatinine Equation (2021)    Anion gap 13 5 - 15    Comment: Performed at [facility] [address] [place], [place] [address]  Lipase, blood     Status: None   Collection Time: 05/23/21 12:29 PM  Result Value Ref Range   Lipase 51 11 - 51 U/L    Comment: Performed at [facility] [address] [place], [place] [address]  Resp Panel by RT-PCR (Flu A&B, Covid) Nasopharyngeal Swab     Status: None   Collection Time: 05/23/21  1:37 PM   Specimen: Nasopharyngeal Swab; Nasopharyngeal(NP) swabs in vial transport medium  Result Value Ref Range   SARS Coronavirus 2 by RT PCR NEGATIVE NEGATIVE    Comment: (NOTE) SARS-CoV-2 target nucleic acids are NOT DETECTED.  The SARS-CoV-2 RNA is generally detectable in upper respiratory specimens during the acute phase of infection. The lowest concentration of SARS-CoV-2 viral copies this assay can detect is 138 copies/mL. A negative result does not preclude SARS-Cov-2 infection and should not be used as the sole basis for treatment or other patient management decisions. A negative result may occur with  improper specimen collection/handling, submission of specimen other than nasopharyngeal swab, presence of viral mutation(s) within the areas targeted by this assay, and inadequate number of viral copies(<138 copies/mL). A negative result must be combined with clinical observations, patient history, and epidemiological information. The expected result is Negative.  Fact Sheet for Patients:  EntrepreneurPulse.com.au  Fact Sheet for Healthcare Providers:  IncredibleEmployment.be  This test is no t yet approved or cleared by the [place] FDA and  has been authorized for detection and/or diagnosis of SARS-CoV-2 by FDA under an Emergency Use Authorization (EUA). This EUA will remain  in effect  (meaning this test can be used) for the duration of the COVID-19 declaration under Section 564(b)(1) of the Act, 21 U.S.C.section 360bbb-3(b)(1), unless the authorization is terminated  or revoked sooner.       Influenza A by PCR NEGATIVE NEGATIVE   Influenza B by PCR NEGATIVE NEGATIVE    Comment: (NOTE) The Xpert Xpress SARS-CoV-2/FLU/RSV plus assay is intended as an aid in the diagnosis of influenza from Nasopharyngeal swab specimens and should not be used as a  sole basis for treatment. Nasal washings and aspirates are unacceptable for Xpert Xpress SARS-CoV-2/FLU/RSV testing.  Fact Sheet for Patients: EntrepreneurPulse.com.au  Fact Sheet for Healthcare Providers: IncredibleEmployment.be  This test is not yet approved or cleared by the [place] FDA and has been authorized for detection and/or diagnosis of SARS-CoV-2 by FDA under an Emergency Use Authorization (EUA). This EUA will remain in effect (meaning this test can be used) for the duration of the COVID-19 declaration under Section 564(b)(1) of the Act, 21 U.S.C. section 360bbb-3(b)(1), unless the authorization is terminated or revoked.  Performed at [facility], [place] [address] [place], [place] [address]   Basic metabolic panel     Status: Abnormal   Collection Time: 05/24/21  6:47 AM  Result Value Ref Range   Sodium 127 (L) 135 - 145 mmol/L   Potassium 4.1 3.5 - 5.1 mmol/L   Chloride 93 (L) 98 - 111 mmol/L   CO2 23 22 - 32 mmol/L   Glucose, Bld 106 (H) 70 - 99 mg/dL    Comment: Glucose reference range applies only to samples taken after fasting for at least 8 hours.   BUN 34 (H) 8 - 23 mg/dL   Creatinine, Ser 1.25 (H) 0.44 - 1.00 mg/dL   Calcium 9.4 8.9 - 10.3 mg/dL   GFR, Estimated 45 (L) >60 mL/min    Comment: (NOTE) Calculated using the CKD-EPI Creatinine Equation (2021)    Anion gap 11 5 - 15    Comment: Performed at [facility], [place]  [address], [place], [place] [address]  CBC     Status: Abnormal   Collection Time: 05/24/21  6:47 AM  Result Value Ref Range   WBC 5.1 4.0 - 10.5 K/uL   RBC 4.61 3.87 - 5.11 MIL/uL   Hemoglobin 10.2 (L) 12.0 - 15.0 g/dL   HCT 31.5 (L) 36.0 - 46.0 %   MCV 68.3 (L) 80.0 - 100.0 fL   MCH 22.1 (L) 26.0 - 34.0 pg   MCHC 32.4 30.0 - 36.0 g/dL   RDW 15.3 11.5 - 15.5 %   Platelets 242 150 - 400 K/uL   nRBC 0.0 0.0 - 0.2 %    Comment: Performed at [facility], [place] [address], [place], [place] [address]  Sodium, urine, random     Status: None   Collection Time: 05/24/21  6:42 PM  Result Value Ref Range   Sodium, Ur <10 mmol/L    Comment: Performed at [facility], [place] [address], [place],  [address]    CT Head Wo Contrast  Result Date: 05/23/2021 CLINICAL DATA:  Provided history: Head trauma, penetrating. Additional history provided: Recent fall, on Xarelto, nausea/vomiting. EXAM: CT HEAD WITHOUT CONTRAST CT CERVICAL SPINE WITHOUT CONTRAST TECHNIQUE: Multidetector CT imaging of the head and cervical spine was performed following the standard protocol without intravenous contrast. Multiplanar CT image reconstructions of the cervical spine were also generated. COMPARISON:  None. FINDINGS: CT HEAD FINDINGS Brain: Cerebral volume appears normal for age. There is prominent vasogenic edema within the right cerebellar hemisphere, highly suspicious for underlying metastatic lesion(s) at this site. Associated posterior fossa mass effect with partial effacement of the fourth ventricle and likely some mass effect upon the dorsal pons. No evidence of obstructive hydrocephalus at this time. The right cerebellar tonsil is displaced inferiorly below the level of foramen magnum by 4 mm. Moderate-sized focus of vasogenic edema within the left parietal lobe, suspicious for an additional site of  parenchymal metastatic disease (for instance as seen on series 3, image 24).  Background mild patchy and ill-defined hypoattenuation within the cerebral white matter, nonspecific but compatible chronic small vessel ischemic disease. Superimposed additional sites of mild vasogenic edema (an additional metastatic lesions) are difficult to exclude by noncontrast CT. No acute intracranial hemorrhage is identified. No acute demarcated cortical infarct. No extra-axial fluid collection. No supratentorial midline shift. Vascular: No hyperdense vessel.  Atherosclerotic calcifications. Skull: Normal. Negative for fracture or focal lesion. Sinuses/Orbits: Visualized orbits show no acute finding. Extensive partial opacification of the right frontal sinus due to the presence of fluid and mucosal thickening. Mild mucosal thickening and possible fluid within the left frontal sinus. Scattered mucosal thickening and fluid within the bilateral ethmoid sinuses. Moderate mucosal thickening within the right maxillary sinus. Mild mucosal thickening within the left maxillary sinus. Other: Right frontotemporal scalp/periorbital hematoma. CT CERVICAL SPINE FINDINGS Alignment: Mild cervicothoracic dextrocurvature. Trace grade 1 retrolisthesis at C3-C4, C4-C5 and C5-C6. Skull base and vertebrae: The basion-dental and atlanto-dental intervals are maintained.No evidence of acute fracture to the cervical spine. Nonspecific subcentimeter sclerotic foci within the T1 vertebral body. Soft tissues and spinal canal: No prevertebral fluid or swelling. No visible canal hematoma. Subcentimeter nodule within the left thyroid lobe. Disc levels: Cervical spondylosis with multilevel disc space narrowing, disc bulges/central disc protrusions, endplate spurring and uncovertebral hypertrophy. Disc space narrowing is greatest at C4-C5 (moderate at this level). No appreciable high-grade spinal canal stenosis. Multilevel bony neural foraminal narrowing. Upper chest: No consolidation within the imaged lung apices. No visible pneumothorax.  Partially imaged right chest infusion port catheter. CT head impressions #1 and #2 called by telephone at the time of interpretation on 05/23/2021 at 1:09 pm to provider Dr. Melina Copa, who verbally acknowledged these results. IMPRESSION: CT head: 1. Prominent vasogenic edema within the right cerebellar hemisphere, highly suspicious for underlying metastatic lesion(s) at this site. Associated cerebellar swelling with partial effacement of the fourth ventricle, and likely some mass effect upon the dorsal pons. No evidence of obstructive hydrocephalus at this time. Inferior displacement of the right cerebellar tonsil below the level of the foramen magnum by 4 mm. 2. Moderate-sized focus of vasogenic edema within the left parietal lobe suspicious for an additional site of parenchymal metastatic disease. A brain MRI with contrast is recommended for further evaluation of the lesions within the left parietal lobe and right cerebellar hemisphere, and to better assess for any additional intracranial metastatic disease. 3. Mild chronic small vessel image changes within the cerebral white matter. 4. Paranasal sinus disease, as described. 5. Right frontotemporal scalp/periorbital hematoma. CT cervical spine: 1. No evidence of acute fracture to the cervical spine. 2. Mild cervicothoracic dextrocurvature. 3. Mild grade 1 retrolisthesis at C3-C4, C4-C5 and C5-C6. 4. Subcentimeter sclerotic foci T1 vertebral body. Although nonspecific, sites of osseous metastatic disease cannot be excluded. A nonemergent thoracic spine MRI with contrast may be helpful for further characterization. 5. Cervical spondylosis, as described. Electronically Signed   By: Kellie Simmering D.O.   On: 05/23/2021 13:11   CT Cervical Spine Wo Contrast  Result Date: 05/23/2021 CLINICAL DATA:  Provided history: Head trauma, penetrating. Additional history provided: Recent fall, on Xarelto, nausea/vomiting. EXAM: CT HEAD WITHOUT CONTRAST CT CERVICAL SPINE WITHOUT  CONTRAST TECHNIQUE: Multidetector CT imaging of the head and cervical spine was performed following the standard protocol without intravenous contrast. Multiplanar CT image reconstructions of the cervical spine were also generated. COMPARISON:  None. FINDINGS: CT HEAD FINDINGS Brain: Cerebral volume appears normal  for age. There is prominent vasogenic edema within the right cerebellar hemisphere, highly suspicious for underlying metastatic lesion(s) at this site. Associated posterior fossa mass effect with partial effacement of the fourth ventricle and likely some mass effect upon the dorsal pons. No evidence of obstructive hydrocephalus at this time. The right cerebellar tonsil is displaced inferiorly below the level of foramen magnum by 4 mm. Moderate-sized focus of vasogenic edema within the left parietal lobe, suspicious for an additional site of parenchymal metastatic disease (for instance as seen on series 3, image 24). Background mild patchy and ill-defined hypoattenuation within the cerebral white matter, nonspecific but compatible chronic small vessel ischemic disease. Superimposed additional sites of mild vasogenic edema (an additional metastatic lesions) are difficult to exclude by noncontrast CT. No acute intracranial hemorrhage is identified. No acute demarcated cortical infarct. No extra-axial fluid collection. No supratentorial midline shift. Vascular: No hyperdense vessel.  Atherosclerotic calcifications. Skull: Normal. Negative for fracture or focal lesion. Sinuses/Orbits: Visualized orbits show no acute finding. Extensive partial opacification of the right frontal sinus due to the presence of fluid and mucosal thickening. Mild mucosal thickening and possible fluid within the left frontal sinus. Scattered mucosal thickening and fluid within the bilateral ethmoid sinuses. Moderate mucosal thickening within the right maxillary sinus. Mild mucosal thickening within the left maxillary sinus. Other:  Right frontotemporal scalp/periorbital hematoma. CT CERVICAL SPINE FINDINGS Alignment: Mild cervicothoracic dextrocurvature. Trace grade 1 retrolisthesis at C3-C4, C4-C5 and C5-C6. Skull base and vertebrae: The basion-dental and atlanto-dental intervals are maintained.No evidence of acute fracture to the cervical spine. Nonspecific subcentimeter sclerotic foci within the T1 vertebral body. Soft tissues and spinal canal: No prevertebral fluid or swelling. No visible canal hematoma. Subcentimeter nodule within the left thyroid lobe. Disc levels: Cervical spondylosis with multilevel disc space narrowing, disc bulges/central disc protrusions, endplate spurring and uncovertebral hypertrophy. Disc space narrowing is greatest at C4-C5 (moderate at this level). No appreciable high-grade spinal canal stenosis. Multilevel bony neural foraminal narrowing. Upper chest: No consolidation within the imaged lung apices. No visible pneumothorax. Partially imaged right chest infusion port catheter. CT head impressions #1 and #2 called by telephone at the time of interpretation on 05/23/2021 at 1:09 pm to provider Dr. Melina Copa, who verbally acknowledged these results. IMPRESSION: CT head: 1. Prominent vasogenic edema within the right cerebellar hemisphere, highly suspicious for underlying metastatic lesion(s) at this site. Associated cerebellar swelling with partial effacement of the fourth ventricle, and likely some mass effect upon the dorsal pons. No evidence of obstructive hydrocephalus at this time. Inferior displacement of the right cerebellar tonsil below the level of the foramen magnum by 4 mm. 2. Moderate-sized focus of vasogenic edema within the left parietal lobe suspicious for an additional site of parenchymal metastatic disease. A brain MRI with contrast is recommended for further evaluation of the lesions within the left parietal lobe and right cerebellar hemisphere, and to better assess for any additional intracranial  metastatic disease. 3. Mild chronic small vessel image changes within the cerebral white matter. 4. Paranasal sinus disease, as described. 5. Right frontotemporal scalp/periorbital hematoma. CT cervical spine: 1. No evidence of acute fracture to the cervical spine. 2. Mild cervicothoracic dextrocurvature. 3. Mild grade 1 retrolisthesis at C3-C4, C4-C5 and C5-C6. 4. Subcentimeter sclerotic foci T1 vertebral body. Although nonspecific, sites of osseous metastatic disease cannot be excluded. A nonemergent thoracic spine MRI with contrast may be helpful for further characterization. 5. Cervical spondylosis, as described. Electronically Signed   By: Kellie Simmering D.O.   On:  05/23/2021 13:11   MR BRAIN W WO CONTRAST  Result Date: 05/24/2021 CLINICAL DATA:  Metastatic disease evaluation EXAM: MRI HEAD WITHOUT AND WITH CONTRAST TECHNIQUE: Multiplanar, multiecho pulse sequences of the brain and surrounding structures were obtained without and with intravenous contrast. CONTRAST:  42mL GADAVIST GADOBUTROL 1 MMOL/ML IV SOLN COMPARISON:  05/23/2021 FINDINGS: Brain: Redemonstrated multiple enhancing brain masses. Right lateral cerebellar heterogeneously enhancing mass, which measures up to 3.4 x 2.5 x 2.6 cm (series 16, image 94 and series 17, image 12), with significant surrounding edema, which causes mass effect on the fourth ventricle and pons. Some hemosiderin deposition is noted within the mass, likely intratumoral hemorrhage. Anterior and inferior to this lesion, there is a 5 x 4 x 5 mm enhancing lesion (series 16, image 75 and series 17, image 13), which appears separate. Redemonstrated 2 mm left cerebellar enhancing lesion (series 16, image 71). Left frontal lobe lesion measures 6 x 5 x 6 mm (series 16, image 162 and series 17, image 18). No significant surrounding edema. Left parietal mass measures 10 x 12 x 10 mm (series 16, image 168 and series 17, image 8), with moderate surrounding edema. No acute hemorrhage or  cerebral midline shift. No hydrocephalus or extra-axial collection. Confluent T2 hyperintense signal in the periventricular white matter and pons, likely the sequela of severe chronic small vessel ischemic disease. Vascular: Normal flow voids. Skull and upper cervical spine: Normal marrow signal. Sinuses/Orbits: Mucous retention cyst in the left maxillary sinus. Mild mucosal thickening in the maxillary sinuses, right frontal sinus, and ethmoid air cells. No acute finding in the orbits. Status post bilateral lens replacements. Other: Right frontal scalp hematoma. IMPRESSION: Redemonstrated enhancing masses in the bilateral cerebellar hemispheres, left frontal lobe, and left parietal lobe. The largest of these is in the right cerebellum and has surrounding edema and mass effect on the fourth ventricle, without evidence of resulting hydrocephalus. Electronically Signed   By: Merilyn Baba M.D.   On: 05/24/2021 18:49   MR BRAIN W WO CONTRAST  Result Date: 05/23/2021 CLINICAL DATA:  Brain metastasis suspected. Fall with head trauma. Abnormal head CT. EXAM: MRI HEAD WITHOUT AND WITH CONTRAST TECHNIQUE: Multiplanar, multiecho pulse sequences of the brain and surrounding structures were obtained without and with intravenous contrast. CONTRAST:  66mL GADAVIST GADOBUTROL 1 MMOL/ML IV SOLN COMPARISON:  Head CT earlier same day. FINDINGS: Brain: Multiple brain masses. In the right lateral cerebellum, there is a partially necrotic metastasis measuring 3.7 cm in diameter with surrounding edema and mass effect. This causes marked narrowing of the fourth ventricle. 2 mm metastasis in the left cerebellum axial image 6. 7 mm metastasis in the left frontal lobe axial image 33. Possible 2 mm metastasis just anterior to that. 13 mm metastasis in the left parietal vertex with regional vasogenic edema. Elsewhere, there chronic small-vessel ischemic changes affecting the pons and cerebral hemispheric white matter. No obstructive  hydrocephalus at this moment. No extra-axial collection. Vascular: Major vessels at the base of the brain show flow. Skull and upper cervical spine: Negative Sinuses/Orbits: Mucosal inflammatory changes of the paranasal sinuses. Fluid levels in the right frontal sinus per orbits negative Other: Right frontal scalp sebaceous cyst. IMPRESSION: Multiple brain metastases. Two cerebellar metastases, the larger in the right lateral hemisphere measuring up to 3.7 cm in size with pronounced associated vasogenic edema and mass effect, resulting in narrowing of the fourth ventricle but apparently without complete ventricular obstruction at this moment. 2 mm metastasis in the left cerebellum. Two definite  metastases in the left cerebral hemisphere in the left posterior frontal region and at the left parietal vertex measuring 7 mm and 13 mm respectively. Mild vasogenic edema surrounding the parietal metastasis. Possible additional 2 mm metastasis just anterior to the left frontal lesion. Paranasal sinusitis, most pronounced affecting the right frontal sinus. Electronically Signed   By: Nelson Chimes M.D.   On: 05/23/2021 15:42   CT CHEST ABDOMEN PELVIS W CONTRAST  Result Date: 05/24/2021 CLINICAL DATA:  Uterine/cervical cancer staging EXAM: CT CHEST, ABDOMEN, AND PELVIS WITH CONTRAST TECHNIQUE: Multidetector CT imaging of the chest, abdomen and pelvis was performed following the standard protocol during bolus administration of intravenous contrast. CONTRAST:  55mL OMNIPAQUE IOHEXOL 350 MG/ML SOLN COMPARISON:  Multiple priors most recent CT chest, abdomen and pelvis dated February 16, 2021 FINDINGS: CT CHEST FINDINGS Cardiovascular: No pericardial effusion. No definite coronary artery calcifications. Minimal atherosclerotic disease of the thoracic aorta. No suspicious central filling defects of the pulmonary arteries. Right chest wall port with tip positioned near the superior cavoatrial junction. Mediastinum/Nodes: Enlarged  left supraclavicular lymph nodes are no longer seen. Interval decreased size left subpectoral lymph nodes. Reference node measures 3 mm on image 16, previously 6 mm. No pathologically enlarged lymph nodes of the chest. Small hiatal hernia. Lungs/Pleura: Central airways are patent. Trace bilateral pleural effusions with associated atelectasis. No consolidation or pneumothorax. No new or enlarging pulmonary nodules. Musculoskeletal: Unchanged mild compression deformity of T5. No aggressive osseous lesions seen in the region of the chest. CT ABDOMEN PELVIS FINDINGS Hepatobiliary: No suspicious focal liver lesions. Gallbladder is unremarkable. No biliary ductal dilation. Pancreas: Unremarkable. No pancreatic ductal dilatation or surrounding inflammatory changes. Spleen: Normal in size without focal abnormality. Adrenals/Urinary Tract: Bilateral adrenal glands are unremarkable. Kidneys enhance symmetrically with no evidence of hydronephrosis or nephrolithiasis. Urinary bladder is distended with no evidence of wall thickening. Stomach/Bowel: Stomach is within normal limits. Appendix appears normal. No evidence of bowel wall thickening, distention, or inflammatory changes. Vascular/Lymphatic: Abdominal and pelvic lymph nodes are overall decreased in size when compared with prior exam. Reference right pericaval lymph node measures 9 mm on series 2, image 69, previously 1.3 cm. Several previously enlarged left periaortic lymph nodes are no longer visible. Reference right pelvic sidewall lymph node measuring 5 mm on image 88, previously 9 mm. A few left-sided pelvic and inguinal lymph nodes are increased in size. Left pelvic sidewall lymph node measuring 1.3 cm on series 2, image 89, previously 1.1 cm. Left external iliac lymph node measuring 1.0 cm on image 92, previously measured 4 mm. Left inguinal lymph node measuring 6 mm on image 101, previously 3 mm. Reproductive: Decreased size of irregular rim enhancing soft tissue  located between the sigmoid colon and uterus, measuring 1.6 x 1.2 cm on series 2, image 86, previously measured up to 3.5 x 2.6 cm. Smaller rim enhancing lesion which was previously described in the pelvis is no longer visible. Other: No abdominopelvic ascites. Musculoskeletal: Interval mild compression deformity of the inferior endplate of L3. No aggressive appearing osseous lesions seen in the region of the abdomen or pelvis. IMPRESSION: 1. Decreased size of pelvic soft tissue lesions. 2. Decreased size of left supraclavicular and left subpectoral lymph nodes. 3. Overall decreased size of abdominal and pelvic lymph nodes; however, a few left-sided pelvic and inguinal lymph nodes have increased in size. 4. New mild compression deformity of the inferior endplate of L3. 5.  Aortic Atherosclerosis (ICD10-I70.0). Electronically Signed   By: Hosie Poisson.D.  On: 05/24/2021 19:13    Review of Systems  All other systems reviewed and are negative. Blood pressure 103/73, pulse 71, temperature (!) 97.2 F (36.2 C), temperature source Oral, resp. rate 18, height 5' (1.524 m), weight 49.5 kg, SpO2 99 %. Physical Exam Constitutional:      Appearance: She is normal weight.  HENT:     Head: Normocephalic and atraumatic.     Right Ear: Tympanic membrane normal.     Left Ear: Tympanic membrane normal.     Nose: Nose normal.     Mouth/Throat:     Mouth: Mucous membranes are moist.  Eyes:     Extraocular Movements: Extraocular movements intact.     Pupils: Pupils are equal, round, and reactive to light.  Cardiovascular:     Rate and Rhythm: Normal rate and regular rhythm.  Pulmonary:     Effort: Pulmonary effort is normal.     Breath sounds: Normal breath sounds.  Abdominal:     General: Abdomen is flat.     Palpations: Abdomen is soft.  Musculoskeletal:        General: Normal range of motion.     Cervical back: Normal range of motion and neck supple.  Skin:    General: Skin is warm.      Capillary Refill: Capillary refill takes less than 2 seconds.  Neurological:     General: No focal deficit present.     Mental Status: She is alert.  Psychiatric:        Mood and Affect: Mood normal.        Thought Content: Thought content normal.        Judgment: Judgment normal.    Assessment/Plan: Metastatic cancer to the brain with multiple lesions largest of which is 3.7 cm in the right cerebellar hemisphere.  This creates significant mass-effect on the fourth ventricle.  Patient does not have hydrocephalus at this time but it is felt that her best chance for prolong survival is with radiation followed by surgical extirpation of the largest lesion in the right hemisphere.  The situation has been discussed with the patient and plans are being made for this to occur in the near future.  Teresa Oliver 05/24/2021, 9:38 PM

## 2021-05-24 NOTE — Hospital Course (Addendum)
°  IMPRESSION: Multiple brain metastases. Two cerebellar metastases, the larger in the right lateral hemisphere measuring up to 3.7 cm in size with pronounced associated vasogenic edema and mass effect, resulting in narrowing of the fourth ventricle but apparently without complete ventricular obstruction at this moment. 2 mm metastasis in the left cerebellum.   Two definite metastases in the left cerebral hemisphere in the left posterior frontal region and at the left parietal vertex measuring 7 mm and 13 mm respectively. Mild vasogenic edema surrounding the parietal metastasis. Possible additional 2 mm metastasis just anterior to the left frontal lesion.

## 2021-05-24 NOTE — Progress Notes (Signed)
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 We will discontinue lenvatinib and pembrolizumab I will order CT imaging of the chest, abdomen and pelvis for objective assessment of response to treatment  Metastatic disease to the brain She is on steroid treatment We will consult radiation oncologist and neuro oncologist for evaluation and treatment  History of uncontrolled hypertension Due to Lenvima Her blood pressure is stable Continue medical management  Hyponatremia Could be due to intracranial process Monitor closely  Remote history of subclavian DVT She has completed 3 months worth of anticoagulation therapy The risk of bleeding outweighs the benefit of remaining on anticoagulation therapy, I will discontinue Xarelto We will start her on DVT prophylaxis  Goals of care Will address her metastatic disease while she is here  Discharge planning Hopefully within the next 2 to 3 days  All questions were answered. The patient knows to call the clinic with any problems, questions or concerns.   The total time spent in the appointment was 40 minutes encounter with patients including review of chart and various tests results, discussions about plan of care and coordination of care plan  Teresa Lark, MD 05/24/2021 8:11 AM  Subjective:  The patient is well-known to me.  We have been in regular communication with the patient since she was diagnosed with COVID infection around December 18.  She got weak and dizzy and had a fall at home.  Due to restriction at the cancer center regarding policy for COVID-positive patient, her treatment and evaluation was changed to next week.  However, the patient got progressively worse and was brought in by family members to be evaluated.  She was admitted overnight when she was found to have brain metastasis. This morning, she appears comfortable.  She denies headaches.  She complains of  persistent dizziness whenever she tries to walk but no nausea or vomiting.  She has mild chronic constipation but this is stable.  Objective:  Vitals:   05/24/21 0216 05/24/21 0606  BP: (!) 145/89 (!) 149/96  Pulse: 69 66  Resp: 19 19  Temp: 97.7 F (36.5 C) 98.2 F (36.8 C)  SpO2: 99% 98%     Intake/Output Summary (Last 24 hours) at 05/24/2021 3382 Last data filed at 05/24/2021 0731 Gross per 24 hour  Intake --  Output 350 ml  Net -350 ml    GENERAL:alert, no distress and comfortable SKIN: skin color, texture, turgor are normal, no rashes or significant lesions EYES: normal, Conjunctiva are pink and non-injected, sclera clear OROPHARYNX:no exudate, no erythema and lips, buccal mucosa, and tongue normal  NECK: supple, thyroid normal size, non-tender, without nodularity LYMPH:  no palpable lymphadenopathy in the cervical, axillary or inguinal LUNGS: clear to auscultation and percussion with normal breathing effort HEART: regular rate & rhythm and no murmurs and no lower extremity edema ABDOMEN:abdomen soft, non-tender and normal bowel sounds Musculoskeletal:no cyanosis of digits and no clubbing  NEURO: alert & oriented x 3 with fluent speech, no focal motor/sensory deficits; I did not test her gait.  She has intentional tremor and past-pointing   Labs:  Recent Labs    04/09/21 1112 04/30/21 1107 05/23/21 1229 05/24/21 0647  NA 137 138 125* 127*  K 4.3 4.6 3.7 4.1  CL 107 106 90* 93*  CO2 22 22 22 23   GLUCOSE 102* 97 138* 106*  BUN 32* 34* 20 34*  CREATININE 1.67* 1.34* 1.06* 1.25*  CALCIUM  9.0 9.1 9.6 9.4  GFRNONAA 32* 42* 55* 45*  PROT 7.4 7.3 6.9  --   ALBUMIN 3.7 3.7 3.6  --   AST 35 29 17  --   ALT 43 35 10  --   ALKPHOS 120 118 77  --   BILITOT 0.4 0.4 0.7  --     Studies: I have reviewed her imaging studies CT Head Wo Contrast  Result Date: 05/23/2021 CLINICAL DATA:  Provided history: Head trauma, penetrating. Additional history provided: Recent fall, on  Xarelto, nausea/vomiting. EXAM: CT HEAD WITHOUT CONTRAST CT CERVICAL SPINE WITHOUT CONTRAST TECHNIQUE: Multidetector CT imaging of the head and cervical spine was performed following the standard protocol without intravenous contrast. Multiplanar CT image reconstructions of the cervical spine were also generated. COMPARISON:  None. FINDINGS: CT HEAD FINDINGS Brain: Cerebral volume appears normal for age. There is prominent vasogenic edema within the right cerebellar hemisphere, highly suspicious for underlying metastatic lesion(s) at this site. Associated posterior fossa mass effect with partial effacement of the fourth ventricle and likely some mass effect upon the dorsal pons. No evidence of obstructive hydrocephalus at this time. The right cerebellar tonsil is displaced inferiorly below the level of foramen magnum by 4 mm. Moderate-sized focus of vasogenic edema within the left parietal lobe, suspicious for an additional site of parenchymal metastatic disease (for instance as seen on series 3, image 24). Background mild patchy and ill-defined hypoattenuation within the cerebral white matter, nonspecific but compatible chronic small vessel ischemic disease. Superimposed additional sites of mild vasogenic edema (an additional metastatic lesions) are difficult to exclude by noncontrast CT. No acute intracranial hemorrhage is identified. No acute demarcated cortical infarct. No extra-axial fluid collection. No supratentorial midline shift. Vascular: No hyperdense vessel.  Atherosclerotic calcifications. Skull: Normal. Negative for fracture or focal lesion. Sinuses/Orbits: Visualized orbits show no acute finding. Extensive partial opacification of the right frontal sinus due to the presence of fluid and mucosal thickening. Mild mucosal thickening and possible fluid within the left frontal sinus. Scattered mucosal thickening and fluid within the bilateral ethmoid sinuses. Moderate mucosal thickening within the right  maxillary sinus. Mild mucosal thickening within the left maxillary sinus. Other: Right frontotemporal scalp/periorbital hematoma. CT CERVICAL SPINE FINDINGS Alignment: Mild cervicothoracic dextrocurvature. Trace grade 1 retrolisthesis at C3-C4, C4-C5 and C5-C6. Skull base and vertebrae: The basion-dental and atlanto-dental intervals are maintained.No evidence of acute fracture to the cervical spine. Nonspecific subcentimeter sclerotic foci within the T1 vertebral body. Soft tissues and spinal canal: No prevertebral fluid or swelling. No visible canal hematoma. Subcentimeter nodule within the left thyroid lobe. Disc levels: Cervical spondylosis with multilevel disc space narrowing, disc bulges/central disc protrusions, endplate spurring and uncovertebral hypertrophy. Disc space narrowing is greatest at C4-C5 (moderate at this level). No appreciable high-grade spinal canal stenosis. Multilevel bony neural foraminal narrowing. Upper chest: No consolidation within the imaged lung apices. No visible pneumothorax. Partially imaged right chest infusion port catheter. CT head impressions #1 and #2 called by telephone at the time of interpretation on 05/23/2021 at 1:09 pm to provider Dr. Melina Copa, who verbally acknowledged these results. IMPRESSION: CT head: 1. Prominent vasogenic edema within the right cerebellar hemisphere, highly suspicious for underlying metastatic lesion(s) at this site. Associated cerebellar swelling with partial effacement of the fourth ventricle, and likely some mass effect upon the dorsal pons. No evidence of obstructive hydrocephalus at this time. Inferior displacement of the right cerebellar tonsil below the level of the foramen magnum by 4 mm. 2. Moderate-sized focus of vasogenic  edema within the left parietal lobe suspicious for an additional site of parenchymal metastatic disease. A brain MRI with contrast is recommended for further evaluation of the lesions within the left parietal lobe and right  cerebellar hemisphere, and to better assess for any additional intracranial metastatic disease. 3. Mild chronic small vessel image changes within the cerebral white matter. 4. Paranasal sinus disease, as described. 5. Right frontotemporal scalp/periorbital hematoma. CT cervical spine: 1. No evidence of acute fracture to the cervical spine. 2. Mild cervicothoracic dextrocurvature. 3. Mild grade 1 retrolisthesis at C3-C4, C4-C5 and C5-C6. 4. Subcentimeter sclerotic foci T1 vertebral body. Although nonspecific, sites of osseous metastatic disease cannot be excluded. A nonemergent thoracic spine MRI with contrast may be helpful for further characterization. 5. Cervical spondylosis, as described. Electronically Signed   By: Kellie Simmering D.O.   On: 05/23/2021 13:11   CT Cervical Spine Wo Contrast  Result Date: 05/23/2021 CLINICAL DATA:  Provided history: Head trauma, penetrating. Additional history provided: Recent fall, on Xarelto, nausea/vomiting. EXAM: CT HEAD WITHOUT CONTRAST CT CERVICAL SPINE WITHOUT CONTRAST TECHNIQUE: Multidetector CT imaging of the head and cervical spine was performed following the standard protocol without intravenous contrast. Multiplanar CT image reconstructions of the cervical spine were also generated. COMPARISON:  None. FINDINGS: CT HEAD FINDINGS Brain: Cerebral volume appears normal for age. There is prominent vasogenic edema within the right cerebellar hemisphere, highly suspicious for underlying metastatic lesion(s) at this site. Associated posterior fossa mass effect with partial effacement of the fourth ventricle and likely some mass effect upon the dorsal pons. No evidence of obstructive hydrocephalus at this time. The right cerebellar tonsil is displaced inferiorly below the level of foramen magnum by 4 mm. Moderate-sized focus of vasogenic edema within the left parietal lobe, suspicious for an additional site of parenchymal metastatic disease (for instance as seen on series 3,  image 24). Background mild patchy and ill-defined hypoattenuation within the cerebral white matter, nonspecific but compatible chronic small vessel ischemic disease. Superimposed additional sites of mild vasogenic edema (an additional metastatic lesions) are difficult to exclude by noncontrast CT. No acute intracranial hemorrhage is identified. No acute demarcated cortical infarct. No extra-axial fluid collection. No supratentorial midline shift. Vascular: No hyperdense vessel.  Atherosclerotic calcifications. Skull: Normal. Negative for fracture or focal lesion. Sinuses/Orbits: Visualized orbits show no acute finding. Extensive partial opacification of the right frontal sinus due to the presence of fluid and mucosal thickening. Mild mucosal thickening and possible fluid within the left frontal sinus. Scattered mucosal thickening and fluid within the bilateral ethmoid sinuses. Moderate mucosal thickening within the right maxillary sinus. Mild mucosal thickening within the left maxillary sinus. Other: Right frontotemporal scalp/periorbital hematoma. CT CERVICAL SPINE FINDINGS Alignment: Mild cervicothoracic dextrocurvature. Trace grade 1 retrolisthesis at C3-C4, C4-C5 and C5-C6. Skull base and vertebrae: The basion-dental and atlanto-dental intervals are maintained.No evidence of acute fracture to the cervical spine. Nonspecific subcentimeter sclerotic foci within the T1 vertebral body. Soft tissues and spinal canal: No prevertebral fluid or swelling. No visible canal hematoma. Subcentimeter nodule within the left thyroid lobe. Disc levels: Cervical spondylosis with multilevel disc space narrowing, disc bulges/central disc protrusions, endplate spurring and uncovertebral hypertrophy. Disc space narrowing is greatest at C4-C5 (moderate at this level). No appreciable high-grade spinal canal stenosis. Multilevel bony neural foraminal narrowing. Upper chest: No consolidation within the imaged lung apices. No visible  pneumothorax. Partially imaged right chest infusion port catheter. CT head impressions #1 and #2 called by telephone at the time of interpretation on 05/23/2021  at 1:09 pm to provider Dr. Melina Copa, who verbally acknowledged these results. IMPRESSION: CT head: 1. Prominent vasogenic edema within the right cerebellar hemisphere, highly suspicious for underlying metastatic lesion(s) at this site. Associated cerebellar swelling with partial effacement of the fourth ventricle, and likely some mass effect upon the dorsal pons. No evidence of obstructive hydrocephalus at this time. Inferior displacement of the right cerebellar tonsil below the level of the foramen magnum by 4 mm. 2. Moderate-sized focus of vasogenic edema within the left parietal lobe suspicious for an additional site of parenchymal metastatic disease. A brain MRI with contrast is recommended for further evaluation of the lesions within the left parietal lobe and right cerebellar hemisphere, and to better assess for any additional intracranial metastatic disease. 3. Mild chronic small vessel image changes within the cerebral white matter. 4. Paranasal sinus disease, as described. 5. Right frontotemporal scalp/periorbital hematoma. CT cervical spine: 1. No evidence of acute fracture to the cervical spine. 2. Mild cervicothoracic dextrocurvature. 3. Mild grade 1 retrolisthesis at C3-C4, C4-C5 and C5-C6. 4. Subcentimeter sclerotic foci T1 vertebral body. Although nonspecific, sites of osseous metastatic disease cannot be excluded. A nonemergent thoracic spine MRI with contrast may be helpful for further characterization. 5. Cervical spondylosis, as described. Electronically Signed   By: Kellie Simmering D.O.   On: 05/23/2021 13:11   MR BRAIN W WO CONTRAST  Result Date: 05/23/2021 CLINICAL DATA:  Brain metastasis suspected. Fall with head trauma. Abnormal head CT. EXAM: MRI HEAD WITHOUT AND WITH CONTRAST TECHNIQUE: Multiplanar, multiecho pulse sequences of the  brain and surrounding structures were obtained without and with intravenous contrast. CONTRAST:  45mL GADAVIST GADOBUTROL 1 MMOL/ML IV SOLN COMPARISON:  Head CT earlier same day. FINDINGS: Brain: Multiple brain masses. In the right lateral cerebellum, there is a partially necrotic metastasis measuring 3.7 cm in diameter with surrounding edema and mass effect. This causes marked narrowing of the fourth ventricle. 2 mm metastasis in the left cerebellum axial image 6. 7 mm metastasis in the left frontal lobe axial image 33. Possible 2 mm metastasis just anterior to that. 13 mm metastasis in the left parietal vertex with regional vasogenic edema. Elsewhere, there chronic small-vessel ischemic changes affecting the pons and cerebral hemispheric white matter. No obstructive hydrocephalus at this moment. No extra-axial collection. Vascular: Major vessels at the base of the brain show flow. Skull and upper cervical spine: Negative Sinuses/Orbits: Mucosal inflammatory changes of the paranasal sinuses. Fluid levels in the right frontal sinus per orbits negative Other: Right frontal scalp sebaceous cyst. IMPRESSION: Multiple brain metastases. Two cerebellar metastases, the larger in the right lateral hemisphere measuring up to 3.7 cm in size with pronounced associated vasogenic edema and mass effect, resulting in narrowing of the fourth ventricle but apparently without complete ventricular obstruction at this moment. 2 mm metastasis in the left cerebellum. Two definite metastases in the left cerebral hemisphere in the left posterior frontal region and at the left parietal vertex measuring 7 mm and 13 mm respectively. Mild vasogenic edema surrounding the parietal metastasis. Possible additional 2 mm metastasis just anterior to the left frontal lesion. Paranasal sinusitis, most pronounced affecting the right frontal sinus. Electronically Signed   By: Nelson Chimes M.D.   On: 05/23/2021 15:42

## 2021-05-24 NOTE — Consult Note (Signed)
Radiation Oncology         (336) [phone] ________________________________  Initial inpatient Consultation  Name: Teresa Oliver MRN: [id number]  Date of Service: 05/23/2021 DOB: 27-Jun-1945  BS:[id number], [facility]  No ref. provider found   REFERRING PHYSICIAN: No ref. provider found  DIAGNOSIS: 76 y.o. female with newly diagnosed brain metastases secondary to metastatic uterine cancer.    ICD-10-CM   1. Brain lesion  G93.9     2. Injury of head, initial encounter  S09.90XA     3. Malignant neoplasm of uterus, unspecified site (HCC)  C55 lidocaine-prilocaine (EMLA) cream    4. Brain metastases (HCC)  C79.31 lidocaine-prilocaine (EMLA) cream    dexamethasone (DECADRON) injection 4 mg    enoxaparin (LOVENOX) injection 30 mg    DISCONTINUED: enoxaparin (LOVENOX) injection 40 mg    5. DNR (do not resuscitate)  Z66     6. Malignant neoplasm of body of uterus, unspecified site (HCC)  C54.9 dexamethasone (DECADRON) injection 4 mg      HISTORY OF PRESENT ILLNESS: Teresa Oliver is a 76 y.o. female seen at the request of Dr. Alvy Bimler.  She was initially diagnosed with inoperable, advanced stage, metastatic uterine cancer in April 2022 and has been on systemic therapy under the care and direction of Dr. Alvy Bimler since that time.  She was initially treated with paclitaxel/carboplatin with a marked response to therapy on initial restaging CT scans from 11/15/2020.  However, more recently, on her restaging scans on 02/18/2021, there was evidence of disease progression so her treatment was changed to Lenvatinib/Pembrolizumab on 02/23/2021.  She developed severe proteinuria in December 2022 so her treatment was being held.  Unfortunately, she then developed COVID-19 which further for resuming treatment.  She had been having significant dizziness and weakness which she attributed to the COVID-19 infection but the symptoms became progressively worse and her family brought her to the emergency  department for evaluation on 05/23/2021.  CT head performed on admission showed prominent vasogenic edema within the right cerebellar hemisphere, highly suspicious for underlying metastatic lesions.  There was associated cerebellar swelling with partial effacement of the fourth ventricle and likely some mass-effect upon the dorsal pons.  There was also moderate sized focus of vasogenic edema within the left parietal lobe suspicious for an additional site of parenchymal metastatic disease.  An MRI brain was performed for further evaluation and this confirmed multiple brain mets, with the largest measuring approximately 4 cm in the right cerebellum and likely the culprit for her current symptoms.  She was started on dexamethasone IV and reports some improvement in the N/V and dizziness but she has not been particularly active out of bed since admission. We have been asked to consult to discuss the potential role of radiotherapy in the management of the metastatic brain disease.  PREVIOUS RADIATION THERAPY: No  PAST MEDICAL HISTORY:  Past Medical History:  Diagnosis Date   Anemia    chronic   Hyperlipemia    Hypertension    Irritable bowel syndrome (IBS)    Thalassemia    Uterine cancer ([place])       PAST SURGICAL HISTORY: Past Surgical History:  Procedure Laterality Date   BIOPSY  08/31/2020   Procedure: BIOPSY;  Surgeon: Wilford Corner, MD;  Location: [facility];  Service: Endoscopy;;   BREAST EXCISIONAL BIOPSY Left    1985 benign   COLONOSCOPY N/A 08/31/2020   Procedure: COLONOSCOPY;  Surgeon: Wilford Corner, MD;  Location: [facility];  Service: Endoscopy;  Laterality:  N/A;   HERNIA REPAIR     IR IMAGING GUIDED PORT INSERTION  09/01/2020   SUBMUCOSAL TATTOO INJECTION  08/31/2020   Procedure: SUBMUCOSAL TATTOO INJECTION;  Surgeon: Wilford Corner, MD;  Location: [facility];  Service: Endoscopy;;    FAMILY HISTORY:  Family History  Problem Relation Age of Onset   Stomach cancer  Mother    Dementia Mother    Thalassemia Mother    Heart attack Father    Prostate cancer Brother    Thalassemia Daughter     SOCIAL HISTORY:  Social History   Socioeconomic History   Marital status: Married    Spouse name: Not on file   Number of children: Not on file   Years of education: Not on file   Highest education level: Not on file  Occupational History   Occupation: retired  Tobacco Use   Smoking status: Never   Smokeless tobacco: Never  Substance and Sexual Activity   Alcohol use: Not Currently   Drug use: Not Currently   Sexual activity: Not on file  Other Topics Concern   Not on file  Social History Narrative   Not on file   Social Determinants of Health   Financial Resource Strain: Not on file  Food Insecurity: Not on file  Transportation Needs: Not on file  Physical Activity: Not on file  Stress: Not on file  Social Connections: Not on file  Intimate Partner Violence: Not on file    ALLERGIES: Patient has no known allergies.  MEDICATIONS:  Current Facility-Administered Medications  Medication Dose Route Frequency Provider Last Rate Last Admin   acetaminophen (TYLENOL) tablet 650 mg  650 mg Oral Q6H PRN Karmen Bongo, MD       Or   acetaminophen (TYLENOL) suppository 650 mg  650 mg Rectal Q6H PRN Karmen Bongo, MD       albuterol (PROVENTIL) (2.5 MG/3ML) 0.083% nebulizer solution 2.5 mg  2.5 mg Nebulization Q2H PRN Karmen Bongo, MD       amLODipine (NORVASC) tablet 10 mg  10 mg Oral Daily Karmen Bongo, MD   10 mg at 05/24/21 1001   atenolol (TENORMIN) tablet 50 mg  50 mg Oral Daily Karmen Bongo, MD   50 mg at 05/24/21 1001   bisacodyl (DULCOLAX) EC tablet 5 mg  5 mg Oral Daily PRN Karmen Bongo, MD       dexamethasone (DECADRON) injection 4 mg  4 mg Intravenous Q12H Alvy Bimler, Ni, MD   4 mg at 05/24/21 1001   docusate sodium (COLACE) capsule 100 mg  100 mg Oral BID Karmen Bongo, MD   100 mg at 05/24/21 1001   doxazosin (CARDURA)  tablet 4 mg  4 mg Oral Daily Karmen Bongo, MD   4 mg at 05/24/21 1001   enoxaparin (LOVENOX) injection 30 mg  30 mg Subcutaneous Q24H Green, Terri L, RPH       hydrALAZINE (APRESOLINE) injection 5 mg  5 mg Intravenous Q4H PRN Karmen Bongo, MD       iohexol (OMNIPAQUE) 9 MG/ML oral solution            lidocaine-prilocaine (EMLA) cream   Topical Once Alvy Bimler, Ni, MD       LORazepam (ATIVAN) tablet 0.5 mg  0.5 mg Oral BID PRN Karmen Bongo, MD       morphine 2 MG/ML injection 2 mg  2 mg Intravenous Q2H PRN Karmen Bongo, MD       ondansetron [facility]) tablet 4 mg  4 mg Oral  Q6H PRN Karmen Bongo, MD       Or   ondansetron [facility]) injection 4 mg  4 mg Intravenous Q6H PRN Karmen Bongo, MD       oxybutynin [facility]) tablet 5 mg  5 mg Oral Q8H PRN Karmen Bongo, MD       oxyCODONE (Oxy IR/ROXICODONE) immediate release tablet 5 mg  5 mg Oral Q4H PRN Karmen Bongo, MD       polyethylene glycol (MIRALAX / GLYCOLAX) packet 17 g  17 g Oral Daily PRN Karmen Bongo, MD       sodium chloride flush (NS) 0.9 % injection 3 mL  3 mL Intravenous Q12H Karmen Bongo, MD   3 mL at 05/23/21 2312    REVIEW OF SYSTEMS:  On review of systems, the patient reports that she is doing fairly well overall.  She denies any chest pain, shortness of breath, cough, fevers, chills, night sweats, or unintended weight changes.  She denies any bowel or bladder disturbances, and denies abdominal pain despite the intermittent nausea or vomiting which seems to be provoked with activity.  She has also had some dizziness, feeling unstable on her feet.  She denies any new musculoskeletal or joint aches or pains. A complete review of systems is obtained and is otherwise negative.    PHYSICAL EXAM:  Wt Readings from Last 3 Encounters:  05/23/21 109 lb 2 oz (49.5 kg)  04/30/21 112 lb 6.4 oz (51 kg)  04/09/21 111 lb 3.2 oz (50.4 kg)   Temp Readings from Last 3 Encounters:  05/24/21 (!) 97.4 F (36.3 C) (Oral)   04/30/21 (!) 97.4 F (36.3 C) (Tympanic)  04/09/21 98 F (36.7 C) (Tympanic)   BP Readings from Last 3 Encounters:  05/24/21 117/76  04/30/21 (!) 142/78  04/09/21 (!) 156/80   Pulse Readings from Last 3 Encounters:  05/24/21 73  04/30/21 69  04/09/21 78   Pain Assessment Pain Score: 0-No pain/10  Physical Exam- from recent ED evaluation Vitals and nursing note reviewed.  Constitutional:      General: She is not in acute distress.    Appearance: Normal appearance. She is well-developed.  HENT:     Head: Normocephalic.     Comments: She has a hematoma over her right forehead and some ecchymosis around her right eye. Eyes:     Conjunctiva/sclera: Conjunctivae normal.  Cardiovascular:     Rate and Rhythm: Normal rate and regular rhythm.     Heart sounds: No murmur heard. Pulmonary:     Effort: Pulmonary effort is normal. No respiratory distress.     Breath sounds: Normal breath sounds.  Abdominal:     Palpations: Abdomen is soft.     Tenderness: There is no abdominal tenderness. There is no guarding or rebound.  Musculoskeletal:        General: No swelling or deformity.     Cervical back: Neck supple.  Skin:    General: Skin is warm and dry.     Capillary Refill: Capillary refill takes less than 2 seconds.  Neurological:     General: No focal deficit present.     Mental Status: She is alert and oriented to person, place, and time.     Cranial Nerves: No cranial nerve deficit.     Sensory: No sensory deficit.     Motor: No weakness.  Psychiatric:        Mood and Affect: Mood normal.    KPS = 70  100 - Normal; no complaints;  no evidence of disease. 90   - Able to carry on normal activity; minor signs or symptoms of disease. 80   - Normal activity with effort; some signs or symptoms of disease. 75   - Cares for self; unable to carry on normal activity or to do active work. 60   - Requires occasional assistance, but is able to care for most of his personal  needs. 50   - Requires considerable assistance and frequent medical care. 65   - Disabled; requires special care and assistance. 62   - Severely disabled; hospital admission is indicated although death not imminent. 91   - Very sick; hospital admission necessary; active supportive treatment necessary. 10   - Moribund; fatal processes progressing rapidly. 0     - Dead  Karnofsky DA, Abelmann [place], Craver LS and Burchenal [facility] [phone]) The use of the nitrogen mustards in the palliative treatment of carcinoma: with particular reference to bronchogenic carcinoma Cancer 1 634-56  LABORATORY DATA:  Lab Results  Component Value Date   WBC 5.1 05/24/2021   HGB 10.2 (L) 05/24/2021   HCT 31.5 (L) 05/24/2021   MCV 68.3 (L) 05/24/2021   PLT 242 05/24/2021   Lab Results  Component Value Date   NA 127 (L) 05/24/2021   K 4.1 05/24/2021   CL 93 (L) 05/24/2021   CO2 23 05/24/2021   Lab Results  Component Value Date   ALT 10 05/23/2021   AST 17 05/23/2021   ALKPHOS 77 05/23/2021   BILITOT 0.7 05/23/2021     RADIOGRAPHY: CT Head Wo Contrast  Result Date: 05/23/2021 CLINICAL DATA:  Provided history: Head trauma, penetrating. Additional history provided: Recent fall, on Xarelto, nausea/vomiting. EXAM: CT HEAD WITHOUT CONTRAST CT CERVICAL SPINE WITHOUT CONTRAST TECHNIQUE: Multidetector CT imaging of the head and cervical spine was performed following the standard protocol without intravenous contrast. Multiplanar CT image reconstructions of the cervical spine were also generated. COMPARISON:  None. FINDINGS: CT HEAD FINDINGS Brain: Cerebral volume appears normal for age. There is prominent vasogenic edema within the right cerebellar hemisphere, highly suspicious for underlying metastatic lesion(s) at this site. Associated posterior fossa mass effect with partial effacement of the fourth ventricle and likely some mass effect upon the dorsal pons. No evidence of obstructive hydrocephalus at this time. The right  cerebellar tonsil is displaced inferiorly below the level of foramen magnum by 4 mm. Moderate-sized focus of vasogenic edema within the left parietal lobe, suspicious for an additional site of parenchymal metastatic disease (for instance as seen on series 3, image 24). Background mild patchy and ill-defined hypoattenuation within the cerebral white matter, nonspecific but compatible chronic small vessel ischemic disease. Superimposed additional sites of mild vasogenic edema (an additional metastatic lesions) are difficult to exclude by noncontrast CT. No acute intracranial hemorrhage is identified. No acute demarcated cortical infarct. No extra-axial fluid collection. No supratentorial midline shift. Vascular: No hyperdense vessel.  Atherosclerotic calcifications. Skull: Normal. Negative for fracture or focal lesion. Sinuses/Orbits: Visualized orbits show no acute finding. Extensive partial opacification of the right frontal sinus due to the presence of fluid and mucosal thickening. Mild mucosal thickening and possible fluid within the left frontal sinus. Scattered mucosal thickening and fluid within the bilateral ethmoid sinuses. Moderate mucosal thickening within the right maxillary sinus. Mild mucosal thickening within the left maxillary sinus. Other: Right frontotemporal scalp/periorbital hematoma. CT CERVICAL SPINE FINDINGS Alignment: Mild cervicothoracic dextrocurvature. Trace grade 1 retrolisthesis at C3-C4, C4-C5 and C5-C6. Skull base and vertebrae: The basion-dental and  atlanto-dental intervals are maintained.No evidence of acute fracture to the cervical spine. Nonspecific subcentimeter sclerotic foci within the T1 vertebral body. Soft tissues and spinal canal: No prevertebral fluid or swelling. No visible canal hematoma. Subcentimeter nodule within the left thyroid lobe. Disc levels: Cervical spondylosis with multilevel disc space narrowing, disc bulges/central disc protrusions, endplate spurring and  uncovertebral hypertrophy. Disc space narrowing is greatest at C4-C5 (moderate at this level). No appreciable high-grade spinal canal stenosis. Multilevel bony neural foraminal narrowing. Upper chest: No consolidation within the imaged lung apices. No visible pneumothorax. Partially imaged right chest infusion port catheter. CT head impressions #1 and #2 called by telephone at the time of interpretation on 05/23/2021 at 1:09 pm to provider Dr. Melina Copa, who verbally acknowledged these results. IMPRESSION: CT head: 1. Prominent vasogenic edema within the right cerebellar hemisphere, highly suspicious for underlying metastatic lesion(s) at this site. Associated cerebellar swelling with partial effacement of the fourth ventricle, and likely some mass effect upon the dorsal pons. No evidence of obstructive hydrocephalus at this time. Inferior displacement of the right cerebellar tonsil below the level of the foramen magnum by 4 mm. 2. Moderate-sized focus of vasogenic edema within the left parietal lobe suspicious for an additional site of parenchymal metastatic disease. A brain MRI with contrast is recommended for further evaluation of the lesions within the left parietal lobe and right cerebellar hemisphere, and to better assess for any additional intracranial metastatic disease. 3. Mild chronic small vessel image changes within the cerebral white matter. 4. Paranasal sinus disease, as described. 5. Right frontotemporal scalp/periorbital hematoma. CT cervical spine: 1. No evidence of acute fracture to the cervical spine. 2. Mild cervicothoracic dextrocurvature. 3. Mild grade 1 retrolisthesis at C3-C4, C4-C5 and C5-C6. 4. Subcentimeter sclerotic foci T1 vertebral body. Although nonspecific, sites of osseous metastatic disease cannot be excluded. A nonemergent thoracic spine MRI with contrast may be helpful for further characterization. 5. Cervical spondylosis, as described. Electronically Signed   By: Kellie Simmering D.O.    On: 05/23/2021 13:11   CT Cervical Spine Wo Contrast  Result Date: 05/23/2021 CLINICAL DATA:  Provided history: Head trauma, penetrating. Additional history provided: Recent fall, on Xarelto, nausea/vomiting. EXAM: CT HEAD WITHOUT CONTRAST CT CERVICAL SPINE WITHOUT CONTRAST TECHNIQUE: Multidetector CT imaging of the head and cervical spine was performed following the standard protocol without intravenous contrast. Multiplanar CT image reconstructions of the cervical spine were also generated. COMPARISON:  None. FINDINGS: CT HEAD FINDINGS Brain: Cerebral volume appears normal for age. There is prominent vasogenic edema within the right cerebellar hemisphere, highly suspicious for underlying metastatic lesion(s) at this site. Associated posterior fossa mass effect with partial effacement of the fourth ventricle and likely some mass effect upon the dorsal pons. No evidence of obstructive hydrocephalus at this time. The right cerebellar tonsil is displaced inferiorly below the level of foramen magnum by 4 mm. Moderate-sized focus of vasogenic edema within the left parietal lobe, suspicious for an additional site of parenchymal metastatic disease (for instance as seen on series 3, image 24). Background mild patchy and ill-defined hypoattenuation within the cerebral white matter, nonspecific but compatible chronic small vessel ischemic disease. Superimposed additional sites of mild vasogenic edema (an additional metastatic lesions) are difficult to exclude by noncontrast CT. No acute intracranial hemorrhage is identified. No acute demarcated cortical infarct. No extra-axial fluid collection. No supratentorial midline shift. Vascular: No hyperdense vessel.  Atherosclerotic calcifications. Skull: Normal. Negative for fracture or focal lesion. Sinuses/Orbits: Visualized orbits show no acute finding. Extensive partial  opacification of the right frontal sinus due to the presence of fluid and mucosal thickening. Mild mucosal  thickening and possible fluid within the left frontal sinus. Scattered mucosal thickening and fluid within the bilateral ethmoid sinuses. Moderate mucosal thickening within the right maxillary sinus. Mild mucosal thickening within the left maxillary sinus. Other: Right frontotemporal scalp/periorbital hematoma. CT CERVICAL SPINE FINDINGS Alignment: Mild cervicothoracic dextrocurvature. Trace grade 1 retrolisthesis at C3-C4, C4-C5 and C5-C6. Skull base and vertebrae: The basion-dental and atlanto-dental intervals are maintained.No evidence of acute fracture to the cervical spine. Nonspecific subcentimeter sclerotic foci within the T1 vertebral body. Soft tissues and spinal canal: No prevertebral fluid or swelling. No visible canal hematoma. Subcentimeter nodule within the left thyroid lobe. Disc levels: Cervical spondylosis with multilevel disc space narrowing, disc bulges/central disc protrusions, endplate spurring and uncovertebral hypertrophy. Disc space narrowing is greatest at C4-C5 (moderate at this level). No appreciable high-grade spinal canal stenosis. Multilevel bony neural foraminal narrowing. Upper chest: No consolidation within the imaged lung apices. No visible pneumothorax. Partially imaged right chest infusion port catheter. CT head impressions #1 and #2 called by telephone at the time of interpretation on 05/23/2021 at 1:09 pm to provider Dr. Melina Copa, who verbally acknowledged these results. IMPRESSION: CT head: 1. Prominent vasogenic edema within the right cerebellar hemisphere, highly suspicious for underlying metastatic lesion(s) at this site. Associated cerebellar swelling with partial effacement of the fourth ventricle, and likely some mass effect upon the dorsal pons. No evidence of obstructive hydrocephalus at this time. Inferior displacement of the right cerebellar tonsil below the level of the foramen magnum by 4 mm. 2. Moderate-sized focus of vasogenic edema within the left parietal lobe  suspicious for an additional site of parenchymal metastatic disease. A brain MRI with contrast is recommended for further evaluation of the lesions within the left parietal lobe and right cerebellar hemisphere, and to better assess for any additional intracranial metastatic disease. 3. Mild chronic small vessel image changes within the cerebral white matter. 4. Paranasal sinus disease, as described. 5. Right frontotemporal scalp/periorbital hematoma. CT cervical spine: 1. No evidence of acute fracture to the cervical spine. 2. Mild cervicothoracic dextrocurvature. 3. Mild grade 1 retrolisthesis at C3-C4, C4-C5 and C5-C6. 4. Subcentimeter sclerotic foci T1 vertebral body. Although nonspecific, sites of osseous metastatic disease cannot be excluded. A nonemergent thoracic spine MRI with contrast may be helpful for further characterization. 5. Cervical spondylosis, as described. Electronically Signed   By: Kellie Simmering D.O.   On: 05/23/2021 13:11   MR BRAIN W WO CONTRAST  Result Date: 05/23/2021 CLINICAL DATA:  Brain metastasis suspected. Fall with head trauma. Abnormal head CT. EXAM: MRI HEAD WITHOUT AND WITH CONTRAST TECHNIQUE: Multiplanar, multiecho pulse sequences of the brain and surrounding structures were obtained without and with intravenous contrast. CONTRAST:  73mL GADAVIST GADOBUTROL 1 MMOL/ML IV SOLN COMPARISON:  Head CT earlier same day. FINDINGS: Brain: Multiple brain masses. In the right lateral cerebellum, there is a partially necrotic metastasis measuring 3.7 cm in diameter with surrounding edema and mass effect. This causes marked narrowing of the fourth ventricle. 2 mm metastasis in the left cerebellum axial image 6. 7 mm metastasis in the left frontal lobe axial image 33. Possible 2 mm metastasis just anterior to that. 13 mm metastasis in the left parietal vertex with regional vasogenic edema. Elsewhere, there chronic small-vessel ischemic changes affecting the pons and cerebral hemispheric white  matter. No obstructive hydrocephalus at this moment. No extra-axial collection. Vascular: Major vessels at the base of  the brain show flow. Skull and upper cervical spine: Negative Sinuses/Orbits: Mucosal inflammatory changes of the paranasal sinuses. Fluid levels in the right frontal sinus per orbits negative Other: Right frontal scalp sebaceous cyst. IMPRESSION: Multiple brain metastases. Two cerebellar metastases, the larger in the right lateral hemisphere measuring up to 3.7 cm in size with pronounced associated vasogenic edema and mass effect, resulting in narrowing of the fourth ventricle but apparently without complete ventricular obstruction at this moment. 2 mm metastasis in the left cerebellum. Two definite metastases in the left cerebral hemisphere in the left posterior frontal region and at the left parietal vertex measuring 7 mm and 13 mm respectively. Mild vasogenic edema surrounding the parietal metastasis. Possible additional 2 mm metastasis just anterior to the left frontal lesion. Paranasal sinusitis, most pronounced affecting the right frontal sinus. Electronically Signed   By: Nelson Chimes M.D.   On: 05/23/2021 15:42      IMPRESSION/PLAN: 1. 76 y.o. female with newly diagnosed brain metastases secondary to metastatic uterine cancer. Dr. Tammi Klippel and I have personally reviewed her CT and MRI imaging and at this point, it is felt that the patient would potentially benefit from radiotherapy. The options include whole brain irradiation versus pre-operative stereotactic radiosurgery. There are pros and cons associated with each of these potential treatment options. Whole brain radiotherapy would treat the known metastatic deposits and help provide some reduction of risk for future brain metastases. However, whole brain radiotherapy carries potential risks including hair loss, subacute somnolence, and neurocognitive changes including a possible reduction in short-term memory. Whole brain  radiotherapy also may carry a lower likelihood of tumor control at the treatment sites because of the low-dose used. Stereotactic radiosurgery carries a higher likelihood for local tumor control at the targeted sites with lower associated risk for neurocognitive changes such as memory loss. However, the use of stereotactic radiosurgery in this setting may leave the patient at increased risk for new brain metastases elsewhere in the brain as high as 50-60%. Accordingly, patients who receive stereotactic radiosurgery in this setting should undergo ongoing surveillance imaging with brain MRI more frequently in order to identify and treat new small brain metastases before they become symptomatic. Stereotactic radiosurgery does carry some different risks, including a risk of radionecrosis.  PLAN: Today, I reviewed the findings and workup thus far with the patient. We discussed the dilemma regarding whole brain radiotherapy versus stereotactic radiosurgery. We discussed the pros and cons of each. We also discussed the logistics and delivery of each. We reviewed the results associated with each of the treatments described above. The patient seems to understand the treatment options and would like to proceed with stereotactic radiosurgery pending results of a repeat MRI brain with SRS protocol for treatment planning.  Pending there are only a few, if any, new lesions noted on this upcoming MRI brain, the recommendation will be to proceed with a single fraction of preoperative SRS to the large lesion in the right cerebellum as well as treatment of the other smaller brain metastases prior to proceeding with surgical resection of the right cerebellar lesion.  We have been in discussion with Dr. Ellene Route, in neurosurgery and he is in agreement with this treatment approach.  He is planning to come by and meet with the patient later today to discuss surgical resection.  Pending this approach is appropriate based on her upcoming  MRI brain scan, we will plan to proceed with CT simulation for [facility] treatment planning tomorrow at 1:30 PM in anticipation of  proceeding with treatment in the near future.  She appears to have a good understanding of her disease and our treatment recommendations and is comfortable and in agreement with the stated plan.  We look forward to meeting her in person when she comes down for CT simulation tomorrow and will sign formal written consent to proceed at that time.  I personally spent 70 minutes in this encounter including chart review, reviewing radiological studies, coordinating care and consulting with neurosurgery, telephone discussion with the patient, entering orders and completing documentation.    Nicholos Johns, PA-C    Tyler Pita, MD  [facility] Direct Dial: [phone]   Fax: [phone] [email or web address]   Skype   [email or web address]

## 2021-05-24 NOTE — Progress Notes (Signed)
PROGRESS NOTE   Teresa Oliver  QJJ:[id number] DOB: 1946/02/17 DOA: 05/23/2021 PCP: [facility]  Brief Narrative:   76 year old white female Known thalassemia minor, HTN HLD irritable bowel syndrome DM TY 2 recent VTE Diagnosed with metastatic uterine cancer on admission 08/2020--Rx [facility] [place]--- [place]env[place] held secondary to recent COVID-19 Experience fall 12/31, came to emergency room Work-up reveals CT with multiple brain mets which was confirmed with MRI brain-at risk for cerebellar herniation and ventricular obstruction-patient was started on Decadron 10 IV daily  Na 125, Bun/Cr 20/1.06, WBC 5.3 hemoglobin, PLT 226  Oncology has been consulted and they will coordinate with neurooncology and radiation oncologist with regards to next steps and planning Therapy to see patient  Hospital-Problem based course  Metastatic uterine cancer with now brain metastases Management deferred to oncology-chemotherapy on hold and further staging set up by oncologist neuro-oncology and radiation oncologist--to see and make decisions Continue Decadron IV 4 mg every 12 at this time for now Hyponatremia Serum osmolality 267 pointing to either hypovolemic hyponatremia or SIADH Get urine studies, although given patient is responding to IV fluids may be hypovolemic hyponatremia Recent DVT status post 3 months treatment Treatment discontinued by Dr. Alvy Bimler DM TY 2 Sugars ranging 106-138 eating 100% of meals Proteinuria NOS, hypertension Hydralazine discontinued in outpatient setting, Cozaar held on admission Continue Norvasc 10, atenolol 50 Anxiety Continue Ativan 0.5 twice daily as needed  DVT prophylaxis: Lovenox Code Status: DNR Family Communication:  Disposition:  Status is: Inpatient  Remains inpatient appropriate because: Work-up needed and correctional abnormalities       Consultants:  Oncology  Procedures:   Antimicrobials:     Subjective: Awake  coherent no dizziness no fever no chills no seizure no unilateral weakness blurred vision no double vision  Objective: Vitals:   05/23/21 2159 05/24/21 0216 05/24/21 0606 05/24/21 1009  BP: 131/79 (!) 145/89 (!) 149/96 110/88  Pulse: 82 69 66 72  Resp: 14 19 19 15   Temp: 97.8 F (36.6 C) 97.7 F (36.5 C) 98.2 F (36.8 C) 97.7 F (36.5 C)  TempSrc: Oral Oral Oral Oral  SpO2: 96% 99% 98% 98%  Weight: 49.5 kg     Height: 5' (1.524 m)       Intake/Output Summary (Last 24 hours) at 05/24/2021 1129 Last data filed at 05/24/2021 4818 Gross per 24 hour  Intake 120 ml  Output 350 ml  Net -230 ml   Filed Weights   05/23/21 2159  Weight: 49.5 kg    Examination:  Awake coherent right-sided hematoma to right temple and temporal area S1-S2 no murmur ROM intact no focal deficit Chest clear no rales rhonchi Abdomen soft no rebound no guarding no distention Finger-nose-finger intact power 5/5 in major muscle groups reflexes 2/3 sensory grossly intact bilaterally and equally Psych euthymic coherent  Data Reviewed: personally reviewed   CBC    Component Value Date/Time   WBC 5.1 05/24/2021 0647   RBC 4.61 05/24/2021 0647   HGB 10.2 (L) 05/24/2021 0647   HGB 9.2 (L) 04/30/2021 1107   HCT 31.5 (L) 05/24/2021 0647   PLT 242 05/24/2021 0647   PLT 204 04/30/2021 1107   MCV 68.3 (L) 05/24/2021 0647   MCH 22.1 (L) 05/24/2021 0647   MCHC 32.4 05/24/2021 0647   RDW 15.3 05/24/2021 0647   LYMPHSABS 0.4 (L) 05/23/2021 1229   MONOABS 0.4 05/23/2021 1229   EOSABS 0.0 05/23/2021 1229   BASOSABS 0.1 05/23/2021 1229   CMP Latest Ref Rng & Units  05/24/2021 05/23/2021 04/30/2021  Glucose 70 - 99 mg/dL 106(H) 138(H) 97  BUN 8 - 23 mg/dL 34(H) 20 34(H)  Creatinine 0.44 - 1.00 mg/dL 1.25(H) 1.06(H) 1.34(H)  Sodium 135 - 145 mmol/L 127(L) 125(L) 138  Potassium 3.5 - 5.1 mmol/L 4.1 3.7 4.6  Chloride 98 - 111 mmol/L 93(L) 90(L) 106  CO2 22 - 32 mmol/L 23 22 22   Calcium 8.9 - 10.3 mg/dL 9.4 9.6 9.1   Total Protein 6.5 - 8.1 g/dL - 6.9 7.3  Total Bilirubin 0.3 - 1.2 mg/dL - 0.7 0.4  Alkaline Phos 38 - 126 U/L - 77 118  AST 15 - 41 U/L - 17 29  ALT 0 - 44 U/L - 10 35     Radiology Studies: CT Head Wo Contrast  Result Date: 05/23/2021 CLINICAL DATA:  Provided history: Head trauma, penetrating. Additional history provided: Recent fall, on Xarelto, nausea/vomiting. EXAM: CT HEAD WITHOUT CONTRAST CT CERVICAL SPINE WITHOUT CONTRAST TECHNIQUE: Multidetector CT imaging of the head and cervical spine was performed following the standard protocol without intravenous contrast. Multiplanar CT image reconstructions of the cervical spine were also generated. COMPARISON:  None. FINDINGS: CT HEAD FINDINGS Brain: Cerebral volume appears normal for age. There is prominent vasogenic edema within the right cerebellar hemisphere, highly suspicious for underlying metastatic lesion(s) at this site. Associated posterior fossa mass effect with partial effacement of the fourth ventricle and likely some mass effect upon the dorsal pons. No evidence of obstructive hydrocephalus at this time. The right cerebellar tonsil is displaced inferiorly below the level of foramen magnum by 4 mm. Moderate-sized focus of vasogenic edema within the left parietal lobe, suspicious for an additional site of parenchymal metastatic disease (for instance as seen on series 3, image 24). Background mild patchy and ill-defined hypoattenuation within the cerebral white matter, nonspecific but compatible chronic small vessel ischemic disease. Superimposed additional sites of mild vasogenic edema (an additional metastatic lesions) are difficult to exclude by noncontrast CT. No acute intracranial hemorrhage is identified. No acute demarcated cortical infarct. No extra-axial fluid collection. No supratentorial midline shift. Vascular: No hyperdense vessel.  Atherosclerotic calcifications. Skull: Normal. Negative for fracture or focal lesion.  Sinuses/Orbits: Visualized orbits show no acute finding. Extensive partial opacification of the right frontal sinus due to the presence of fluid and mucosal thickening. Mild mucosal thickening and possible fluid within the left frontal sinus. Scattered mucosal thickening and fluid within the bilateral ethmoid sinuses. Moderate mucosal thickening within the right maxillary sinus. Mild mucosal thickening within the left maxillary sinus. Other: Right frontotemporal scalp/periorbital hematoma. CT CERVICAL SPINE FINDINGS Alignment: Mild cervicothoracic dextrocurvature. Trace grade 1 retrolisthesis at C3-C4, C4-C5 and C5-C6. Skull base and vertebrae: The basion-dental and atlanto-dental intervals are maintained.No evidence of acute fracture to the cervical spine. Nonspecific subcentimeter sclerotic foci within the T1 vertebral body. Soft tissues and spinal canal: No prevertebral fluid or swelling. No visible canal hematoma. Subcentimeter nodule within the left thyroid lobe. Disc levels: Cervical spondylosis with multilevel disc space narrowing, disc bulges/central disc protrusions, endplate spurring and uncovertebral hypertrophy. Disc space narrowing is greatest at C4-C5 (moderate at this level). No appreciable high-grade spinal canal stenosis. Multilevel bony neural foraminal narrowing. Upper chest: No consolidation within the imaged lung apices. No visible pneumothorax. Partially imaged right chest infusion port catheter. CT head impressions #1 and #2 called by telephone at the time of interpretation on 05/23/2021 at 1:09 pm to provider Dr. Melina Copa, who verbally acknowledged these results. IMPRESSION: CT head: 1. Prominent vasogenic edema within the  right cerebellar hemisphere, highly suspicious for underlying metastatic lesion(s) at this site. Associated cerebellar swelling with partial effacement of the fourth ventricle, and likely some mass effect upon the dorsal pons. No evidence of obstructive hydrocephalus at this  time. Inferior displacement of the right cerebellar tonsil below the level of the foramen magnum by 4 mm. 2. Moderate-sized focus of vasogenic edema within the left parietal lobe suspicious for an additional site of parenchymal metastatic disease. A brain MRI with contrast is recommended for further evaluation of the lesions within the left parietal lobe and right cerebellar hemisphere, and to better assess for any additional intracranial metastatic disease. 3. Mild chronic small vessel image changes within the cerebral white matter. 4. Paranasal sinus disease, as described. 5. Right frontotemporal scalp/periorbital hematoma. CT cervical spine: 1. No evidence of acute fracture to the cervical spine. 2. Mild cervicothoracic dextrocurvature. 3. Mild grade 1 retrolisthesis at C3-C4, C4-C5 and C5-C6. 4. Subcentimeter sclerotic foci T1 vertebral body. Although nonspecific, sites of osseous metastatic disease cannot be excluded. A nonemergent thoracic spine MRI with contrast may be helpful for further characterization. 5. Cervical spondylosis, as described. Electronically Signed   By: Kellie Simmering D.O.   On: 05/23/2021 13:11   CT Cervical Spine Wo Contrast  Result Date: 05/23/2021 CLINICAL DATA:  Provided history: Head trauma, penetrating. Additional history provided: Recent fall, on Xarelto, nausea/vomiting. EXAM: CT HEAD WITHOUT CONTRAST CT CERVICAL SPINE WITHOUT CONTRAST TECHNIQUE: Multidetector CT imaging of the head and cervical spine was performed following the standard protocol without intravenous contrast. Multiplanar CT image reconstructions of the cervical spine were also generated. COMPARISON:  None. FINDINGS: CT HEAD FINDINGS Brain: Cerebral volume appears normal for age. There is prominent vasogenic edema within the right cerebellar hemisphere, highly suspicious for underlying metastatic lesion(s) at this site. Associated posterior fossa mass effect with partial effacement of the fourth ventricle and  likely some mass effect upon the dorsal pons. No evidence of obstructive hydrocephalus at this time. The right cerebellar tonsil is displaced inferiorly below the level of foramen magnum by 4 mm. Moderate-sized focus of vasogenic edema within the left parietal lobe, suspicious for an additional site of parenchymal metastatic disease (for instance as seen on series 3, image 24). Background mild patchy and ill-defined hypoattenuation within the cerebral white matter, nonspecific but compatible chronic small vessel ischemic disease. Superimposed additional sites of mild vasogenic edema (an additional metastatic lesions) are difficult to exclude by noncontrast CT. No acute intracranial hemorrhage is identified. No acute demarcated cortical infarct. No extra-axial fluid collection. No supratentorial midline shift. Vascular: No hyperdense vessel.  Atherosclerotic calcifications. Skull: Normal. Negative for fracture or focal lesion. Sinuses/Orbits: Visualized orbits show no acute finding. Extensive partial opacification of the right frontal sinus due to the presence of fluid and mucosal thickening. Mild mucosal thickening and possible fluid within the left frontal sinus. Scattered mucosal thickening and fluid within the bilateral ethmoid sinuses. Moderate mucosal thickening within the right maxillary sinus. Mild mucosal thickening within the left maxillary sinus. Other: Right frontotemporal scalp/periorbital hematoma. CT CERVICAL SPINE FINDINGS Alignment: Mild cervicothoracic dextrocurvature. Trace grade 1 retrolisthesis at C3-C4, C4-C5 and C5-C6. Skull base and vertebrae: The basion-dental and atlanto-dental intervals are maintained.No evidence of acute fracture to the cervical spine. Nonspecific subcentimeter sclerotic foci within the T1 vertebral body. Soft tissues and spinal canal: No prevertebral fluid or swelling. No visible canal hematoma. Subcentimeter nodule within the left thyroid lobe. Disc levels: Cervical  spondylosis with multilevel disc space narrowing, disc bulges/central disc protrusions, endplate  spurring and uncovertebral hypertrophy. Disc space narrowing is greatest at C4-C5 (moderate at this level). No appreciable high-grade spinal canal stenosis. Multilevel bony neural foraminal narrowing. Upper chest: No consolidation within the imaged lung apices. No visible pneumothorax. Partially imaged right chest infusion port catheter. CT head impressions #1 and #2 called by telephone at the time of interpretation on 05/23/2021 at 1:09 pm to provider Dr. Melina Copa, who verbally acknowledged these results. IMPRESSION: CT head: 1. Prominent vasogenic edema within the right cerebellar hemisphere, highly suspicious for underlying metastatic lesion(s) at this site. Associated cerebellar swelling with partial effacement of the fourth ventricle, and likely some mass effect upon the dorsal pons. No evidence of obstructive hydrocephalus at this time. Inferior displacement of the right cerebellar tonsil below the level of the foramen magnum by 4 mm. 2. Moderate-sized focus of vasogenic edema within the left parietal lobe suspicious for an additional site of parenchymal metastatic disease. A brain MRI with contrast is recommended for further evaluation of the lesions within the left parietal lobe and right cerebellar hemisphere, and to better assess for any additional intracranial metastatic disease. 3. Mild chronic small vessel image changes within the cerebral white matter. 4. Paranasal sinus disease, as described. 5. Right frontotemporal scalp/periorbital hematoma. CT cervical spine: 1. No evidence of acute fracture to the cervical spine. 2. Mild cervicothoracic dextrocurvature. 3. Mild grade 1 retrolisthesis at C3-C4, C4-C5 and C5-C6. 4. Subcentimeter sclerotic foci T1 vertebral body. Although nonspecific, sites of osseous metastatic disease cannot be excluded. A nonemergent thoracic spine MRI with contrast may be helpful for  further characterization. 5. Cervical spondylosis, as described. Electronically Signed   By: Kellie Simmering D.O.   On: 05/23/2021 13:11   MR BRAIN W WO CONTRAST  Result Date: 05/23/2021 CLINICAL DATA:  Brain metastasis suspected. Fall with head trauma. Abnormal head CT. EXAM: MRI HEAD WITHOUT AND WITH CONTRAST TECHNIQUE: Multiplanar, multiecho pulse sequences of the brain and surrounding structures were obtained without and with intravenous contrast. CONTRAST:  51mL GADAVIST GADOBUTROL 1 MMOL/ML IV SOLN COMPARISON:  Head CT earlier same day. FINDINGS: Brain: Multiple brain masses. In the right lateral cerebellum, there is a partially necrotic metastasis measuring 3.7 cm in diameter with surrounding edema and mass effect. This causes marked narrowing of the fourth ventricle. 2 mm metastasis in the left cerebellum axial image 6. 7 mm metastasis in the left frontal lobe axial image 33. Possible 2 mm metastasis just anterior to that. 13 mm metastasis in the left parietal vertex with regional vasogenic edema. Elsewhere, there chronic small-vessel ischemic changes affecting the pons and cerebral hemispheric white matter. No obstructive hydrocephalus at this moment. No extra-axial collection. Vascular: Major vessels at the base of the brain show flow. Skull and upper cervical spine: Negative Sinuses/Orbits: Mucosal inflammatory changes of the paranasal sinuses. Fluid levels in the right frontal sinus per orbits negative Other: Right frontal scalp sebaceous cyst. IMPRESSION: Multiple brain metastases. Two cerebellar metastases, the larger in the right lateral hemisphere measuring up to 3.7 cm in size with pronounced associated vasogenic edema and mass effect, resulting in narrowing of the fourth ventricle but apparently without complete ventricular obstruction at this moment. 2 mm metastasis in the left cerebellum. Two definite metastases in the left cerebral hemisphere in the left posterior frontal region and at the left  parietal vertex measuring 7 mm and 13 mm respectively. Mild vasogenic edema surrounding the parietal metastasis. Possible additional 2 mm metastasis just anterior to the left frontal lesion. Paranasal sinusitis, most pronounced affecting the  right frontal sinus. Electronically Signed   By: Nelson Chimes M.D.   On: 05/23/2021 15:42     Scheduled Meds:  amLODipine  10 mg Oral Daily   atenolol  50 mg Oral Daily   dexamethasone (DECADRON) injection  4 mg Intravenous Q12H   docusate sodium  100 mg Oral BID   doxazosin  4 mg Oral Daily   enoxaparin (LOVENOX) injection  40 mg Subcutaneous Q24H   iohexol       lidocaine-prilocaine   Topical Once   sodium chloride flush  3 mL Intravenous Q12H   Continuous Infusions:   LOS: 1 day   Time spent: [place], MD [facility] To contact the attending provider between 7A-7P or the covering provider during after hours 7P-7A, please log into the web site [email or web address] and access using universal [place] password for that web site. If you do not have the password, please call the hospital operator.  05/24/2021, 11:29 AM

## 2021-05-25 ENCOUNTER — Ambulatory Visit
Admission: RE | Admit: 2021-05-25 | Discharge: 2021-05-25 | Disposition: A | Discharge status: Home / Self Care | Payer: Medicare Other | Source: Ambulatory Visit | Attending: Radiation Oncology | Admitting: Radiation Oncology

## 2021-05-25 ENCOUNTER — Ambulatory Visit: Payer: Medicare Other

## 2021-05-25 ENCOUNTER — Other Ambulatory Visit: Payer: Self-pay | Admitting: Neurological Surgery

## 2021-05-25 ENCOUNTER — Ambulatory Visit: Payer: Medicare Other | Admitting: Hematology and Oncology

## 2021-05-25 DIAGNOSIS — Z510 Encounter for antineoplastic radiation therapy: Secondary | ICD-10-CM | POA: Insufficient documentation

## 2021-05-25 DIAGNOSIS — C55 Malignant neoplasm of uterus, part unspecified: Secondary | ICD-10-CM | POA: Insufficient documentation

## 2021-05-25 DIAGNOSIS — C7931 Secondary malignant neoplasm of brain: Secondary | ICD-10-CM | POA: Insufficient documentation

## 2021-05-25 LAB — COMPREHENSIVE METABOLIC PANEL
ALT: 13 U/L (ref 0–44)
AST: 16 U/L (ref 15–41)
Albumin: 3.6 g/dL (ref 3.5–5.0)
Alkaline Phosphatase: 62 U/L (ref 38–126)
Anion gap: 11 (ref 5–15)
BUN: 49 mg/dL — ABNORMAL HIGH (ref 8–23)
CO2: 23 mmol/L (ref 22–32)
Calcium: 9.4 mg/dL (ref 8.9–10.3)
Chloride: 94 mmol/L — ABNORMAL LOW (ref 98–111)
Creatinine, Ser: 1.45 mg/dL — ABNORMAL HIGH (ref 0.44–1.00)
GFR, Estimated: 38 mL/min — ABNORMAL LOW (ref >60)
Glucose, Bld: 114 mg/dL — ABNORMAL HIGH (ref 70–99)
Potassium: 4.5 mmol/L (ref 3.5–5.1)
Sodium: 128 mmol/L — ABNORMAL LOW (ref 135–145)
Total Bilirubin: 0.6 mg/dL (ref 0.3–1.2)
Total Protein: 6.9 g/dL (ref 6.5–8.1)

## 2021-05-25 LAB — CBC WITH DIFFERENTIAL/PLATELET
Abs Immature Granulocytes: 0.06 10*3/uL (ref 0.00–0.07)
Basophils Absolute: 0 10*3/uL (ref 0.0–0.1)
Basophils Relative: 0 %
Eosinophils Absolute: 0 10*3/uL (ref 0.0–0.5)
Eosinophils Relative: 0 %
HCT: 29.5 % — ABNORMAL LOW (ref 36.0–46.0)
Hemoglobin: 9.6 g/dL — ABNORMAL LOW (ref 12.0–15.0)
Immature Granulocytes: 1 %
Lymphocytes Relative: 4 %
Lymphs Abs: 0.4 10*3/uL — ABNORMAL LOW (ref 0.7–4.0)
MCH: 22.6 pg — ABNORMAL LOW (ref 26.0–34.0)
MCHC: 32.5 g/dL (ref 30.0–36.0)
MCV: 69.4 fL — ABNORMAL LOW (ref 80.0–100.0)
Monocytes Absolute: 0.5 10*3/uL (ref 0.1–1.0)
Monocytes Relative: 6 %
Neutro Abs: 8.2 10*3/uL — ABNORMAL HIGH (ref 1.7–7.7)
Neutrophils Relative %: 89 %
Platelets: 234 10*3/uL (ref 150–400)
RBC: 4.25 MIL/uL (ref 3.87–5.11)
RDW: 15.4 % (ref 11.5–15.5)
WBC: 9.2 10*3/uL (ref 4.0–10.5)
nRBC: 0 % (ref 0.0–0.2)

## 2021-05-25 MED ORDER — HEPARIN SOD (PORK) LOCK FLUSH 100 UNIT/ML IV SOLN
500.0000 [IU] | Freq: Once | INTRAVENOUS | Status: AC
  Administered 2021-05-25: 500 [IU] via INTRAVENOUS
  Filled 2021-05-25: qty 5

## 2021-05-25 MED ORDER — DEXAMETHASONE 4 MG PO TABS: 4.0000 mg | ORAL_TABLET | Freq: Two times a day (BID) | ORAL | 0 refills | Status: DC

## 2021-05-25 NOTE — Progress Notes (Signed)
°  Radiation Oncology         [phone] ________________________________  Name: Teresa Oliver MRN: [id number]  Date: 05/25/2021  DOB: [birth year]  SIMULATION AND TREATMENT PLANNING NOTE    ICD-10-CM   1. Brain metastases ([place])  C79.31       DIAGNOSIS:  76 y.o. female with five brain metastases secondary to metastatic uterine cancer.  NARRATIVE:  The patient was brought to the [facility].  Identity was confirmed.  All relevant records and images related to the planned course of therapy were reviewed.  The patient freely provided informed written consent to proceed with treatment after reviewing the details related to the planned course of therapy. The consent form was witnessed and verified by the simulation staff. Intravenous access was established for contrast administration. Then, the patient was set-up in a stable reproducible supine position for radiation therapy.  A relocatable thermoplastic stereotactic head frame was fabricated for precise immobilization.  CT images were obtained.  Surface markings were placed.  The CT images were loaded into the planning software and fused with the patient's targeting MRI scan.  Then the target and avoidance structures were contoured.  Treatment planning then occurred.  The radiation prescription was entered and confirmed.  I have requested 3D planning  I have requested a DVH of the following structures: Brain stem, brain, left eye, right eye, lenses, optic chiasm, target volumes, uninvolved brain, and normal tissue.    SPECIAL TREATMENT PROCEDURE:  The planned course of therapy using radiation constitutes a special treatment procedure. Special care is required in the management of this patient for the following reasons. This treatment constitutes a Special Treatment Procedure for the following reason: High dose per fraction requiring special monitoring for increased toxicities of treatment including daily imaging.  The special nature of  the planned course of radiotherapy will require increased physician supervision and oversight to ensure patient's safety with optimal treatment outcomes.  This requires extended time and effort.  PLAN:  The patient will receive 20 Gy in 1 fraction to her four smaller metastases and 15 Gy in one fraction to the largest left cerebellar metastasis to be followed by possible left posterior craniectomy and resection with Dr. Ellene Route.  ________________________________  Sheral Apley. Tammi Klippel, M.D.

## 2021-05-25 NOTE — Discharge Summary (Signed)
Physician Discharge Summary  Teresa Oliver DGU:[id number] DOB: [birth year] DOA: 05/23/2021  PCP: [facility]  Admit date: 05/23/2021 Discharge date: 05/25/2021  Time spent: 36 minutes  Recommendations for Outpatient Follow-up:  DOAC discontinued this admission secondary to fall and bleed Outpatient XRT as well as neurosurgery to be coordinated Going home with home health Needs repeat labs specifically sodium in about a week New medications this admission Decadron 4 mg twice daily which is to be adjusted by oncology/radiation oncology  Discharge Diagnoses:  MAIN problem for hospitalization   Fall, hematoma to right temporal region  Please see below for itemized issues addressed in [facility]- refer to other progress notes for clarity if needed  Discharge Condition: Fair  Diet recommendation: Heart healthy  Filed Weights   05/23/21 2159  Weight: 49.5 kg    History of present illness:  76 year old white female Known thalassemia minor, HTN HLD irritable bowel syndrome DM TY 2 recent VTE Diagnosed with metastatic uterine cancer on admission 08/2020--Rx [facility] [place]--- Lenvima held secondary to recent COVID-19 Experience fall 12/31, came to emergency room Work-up reveals CT with multiple brain mets which was confirmed with MRI brain-at risk for cerebellar herniation and ventricular obstruction-patient was started on Decadron 10 IV daily   Na 125, Bun/Cr 20/1.06, WBC 5.3 hemoglobin, PLT 226   Oncology has been consulted and they will coordinate with neurooncology and radiation oncologist with regards to next steps and planning Therapy to see patient  Hospital Course:  Metastatic uterine cancer with now brain metastases Management deferred to oncology-chemotherapy on hold and further staging set up by oncologist neuro-oncology and radiation oncologist and neurosurgery all coordinated care and patient will undergo stimulation for total brain irradiation and then  will probably need outpatient coordination with Dr. Ellene Route of neurosurgery for definitive stereotactic lesion management Decadron switched to p.o. 4 mg every 12 Hyponatremia Serum osmolality 267, urine osmolality 189, urine sodium 10 so likely hypovolemic hyponatremia and sodium is up to 128 Patient will need to restrict fluids slightly and I have discussed this briefly with her-she should get repeat labs in about a week Recent DVT status post 3 months treatment Treatment discontinued by Dr. Alvy Bimler as high risk for bleeding and injury if falls and hits head DM TY 2 Sugars ranging 106-138 eating 100% of meals Proteinuria NOS, hypertension Hydralazine discontinued in outpatient setting, Cozaar held on admission Continue Norvasc 10, atenolol 50 Dizziness earlier during admission felt to be secondary to brain mets patient stable and not orthostatic  Anxiety Continue Ativan 0.5 twice daily as needed   Discharge Exam: Vitals:   05/24/21 2108 05/25/21 1049  BP: 103/73 112/76  Pulse: 71 76  Resp: 18   Temp: (!) 97.2 F (36.2 C)   SpO2: 99%     Subj on day of d/c   awake coherent pleasant no distress ambulated 80 feet in the hall no chest pain no fever   General Exam on discharge  EOMI NCAT right sided hematoma Temporal region External ocular movements intact no focal deficit  chest clinically clear no added sound rales or rhonchi S1-S2 no murmur no rub no gallop abdomen soft no rebound ROM intact Finger-nose-finger intact     Discharge Instructions   Discharge Instructions     Diet - low sodium heart healthy   Complete by: As directed    Discharge instructions   Complete by: As directed    Please notice that you have been discontinued off of several medications that can affect your blood  pressure and cause dizziness-this will be reevaluated in the outpatient setting with your oncologist Dr. Simeon Craft such We have also started you on Decadron 4 mg twice a day which is a  steroid which should help with the swelling in the brain until you can start radiation therapy for the lesions in the brain Please do not take any further blood thinners given your fall and risk of bleeding into your brain if you do have another fall Oncology will coordinate your care in the outpatient setting   Increase activity slowly   Complete by: As directed       Allergies as of 05/25/2021   No Known Allergies      Medication List     STOP taking these medications    hydrALAZINE 25 MG tablet Commonly known as: APRESOLINE   lenvatinib 20 mg daily dose 2 x 10 MG capsule Commonly known as: LENVIMA   losartan 50 MG tablet Commonly known as: Cozaar   Xarelto 20 MG Tabs tablet Generic drug: rivaroxaban       TAKE these medications    amLODipine 10 MG tablet Commonly known as: NORVASC Take 1 tablet (10 mg total) by mouth daily.   atenolol 50 MG tablet Commonly known as: TENORMIN TAKE 1 TABLET(50 MG) BY MOUTH DAILY What changed: See the new instructions.   cholecalciferol 25 MCG (1000 UNIT) tablet Commonly known as: VITAMIN D3 Take 1,000 Units by mouth daily.   dexamethasone 4 MG tablet Commonly known as: DECADRON Take 1 tablet (4 mg total) by mouth 2 (two) times daily with a meal.   doxazosin 4 MG tablet Commonly known as: Cardura Take 1 tablet (4 mg total) by mouth daily.   lidocaine-prilocaine cream Commonly known as: EMLA Apply 1 application topically daily as needed.   LORazepam 0.5 MG tablet Commonly known as: ATIVAN TAKE 1 TABLET(0.5 MG) BY MOUTH TWICE DAILY AS NEEDED FOR ANXIETY What changed:  how much to take how to take this when to take this reasons to take this additional instructions   MAGnesium-Oxide 400 (240 Mg) MG tablet Generic drug: magnesium oxide TAKE 1 TABLET(400 MG) BY MOUTH TWICE DAILY What changed: See the new instructions.   ondansetron 8 MG tablet Commonly known as: ZOFRAN Take 1 tablet (8 mg total) by mouth every 8  (eight) hours as needed for nausea.   oxybutynin 5 MG tablet Commonly known as: DITROPAN Take 1 tablet (5 mg total) by mouth every 8 (eight) hours as needed for bladder spasms.   oxyCODONE 5 MG immediate release tablet Commonly known as: Oxy IR/ROXICODONE Take 1 tablet (5 mg total) by mouth every 4 (four) hours as needed for severe pain.   polyethylene glycol 17 g packet Commonly known as: MIRALAX / GLYCOLAX Take 17 g by mouth 2 (two) times daily as needed for moderate constipation.   prochlorperazine 10 MG tablet Commonly known as: COMPAZINE Take 1 tablet (10 mg total) by mouth every 6 (six) hours as needed for nausea or vomiting.   senna-docusate 8.6-50 MG tablet Commonly known as: Senokot-S Take 1 tablet by mouth 2 (two) times daily. What changed:  when to take this reasons to take this       No Known Allergies    The results of significant diagnostics from this hospitalization (including imaging, microbiology, ancillary and laboratory) are listed below for reference.    Significant Diagnostic Studies: CT Head Wo Contrast  Result Date: 05/23/2021 CLINICAL DATA:  Provided history: Head trauma, penetrating. Additional history provided:  Recent fall, on Xarelto, nausea/vomiting. EXAM: CT HEAD WITHOUT CONTRAST CT CERVICAL SPINE WITHOUT CONTRAST TECHNIQUE: Multidetector CT imaging of the head and cervical spine was performed following the standard protocol without intravenous contrast. Multiplanar CT image reconstructions of the cervical spine were also generated. COMPARISON:  None. FINDINGS: CT HEAD FINDINGS Brain: Cerebral volume appears normal for age. There is prominent vasogenic edema within the right cerebellar hemisphere, highly suspicious for underlying metastatic lesion(s) at this site. Associated posterior fossa mass effect with partial effacement of the fourth ventricle and likely some mass effect upon the dorsal pons. No evidence of obstructive hydrocephalus at this time.  The right cerebellar tonsil is displaced inferiorly below the level of foramen magnum by 4 mm. Moderate-sized focus of vasogenic edema within the left parietal lobe, suspicious for an additional site of parenchymal metastatic disease (for instance as seen on series 3, image 24). Background mild patchy and ill-defined hypoattenuation within the cerebral white matter, nonspecific but compatible chronic small vessel ischemic disease. Superimposed additional sites of mild vasogenic edema (an additional metastatic lesions) are difficult to exclude by noncontrast CT. No acute intracranial hemorrhage is identified. No acute demarcated cortical infarct. No extra-axial fluid collection. No supratentorial midline shift. Vascular: No hyperdense vessel.  Atherosclerotic calcifications. Skull: Normal. Negative for fracture or focal lesion. Sinuses/Orbits: Visualized orbits show no acute finding. Extensive partial opacification of the right frontal sinus due to the presence of fluid and mucosal thickening. Mild mucosal thickening and possible fluid within the left frontal sinus. Scattered mucosal thickening and fluid within the bilateral ethmoid sinuses. Moderate mucosal thickening within the right maxillary sinus. Mild mucosal thickening within the left maxillary sinus. Other: Right frontotemporal scalp/periorbital hematoma. CT CERVICAL SPINE FINDINGS Alignment: Mild cervicothoracic dextrocurvature. Trace grade 1 retrolisthesis at C3-C4, C4-C5 and C5-C6. Skull base and vertebrae: The basion-dental and atlanto-dental intervals are maintained.No evidence of acute fracture to the cervical spine. Nonspecific subcentimeter sclerotic foci within the T1 vertebral body. Soft tissues and spinal canal: No prevertebral fluid or swelling. No visible canal hematoma. Subcentimeter nodule within the left thyroid lobe. Disc levels: Cervical spondylosis with multilevel disc space narrowing, disc bulges/central disc protrusions, endplate spurring  and uncovertebral hypertrophy. Disc space narrowing is greatest at C4-C5 (moderate at this level). No appreciable high-grade spinal canal stenosis. Multilevel bony neural foraminal narrowing. Upper chest: No consolidation within the imaged lung apices. No visible pneumothorax. Partially imaged right chest infusion port catheter. CT head impressions #1 and #2 called by telephone at the time of interpretation on 05/23/2021 at 1:09 pm to provider Dr. Melina Copa, who verbally acknowledged these results. IMPRESSION: CT head: 1. Prominent vasogenic edema within the right cerebellar hemisphere, highly suspicious for underlying metastatic lesion(s) at this site. Associated cerebellar swelling with partial effacement of the fourth ventricle, and likely some mass effect upon the dorsal pons. No evidence of obstructive hydrocephalus at this time. Inferior displacement of the right cerebellar tonsil below the level of the foramen magnum by 4 mm. 2. Moderate-sized focus of vasogenic edema within the left parietal lobe suspicious for an additional site of parenchymal metastatic disease. A brain MRI with contrast is recommended for further evaluation of the lesions within the left parietal lobe and right cerebellar hemisphere, and to better assess for any additional intracranial metastatic disease. 3. Mild chronic small vessel image changes within the cerebral white matter. 4. Paranasal sinus disease, as described. 5. Right frontotemporal scalp/periorbital hematoma. CT cervical spine: 1. No evidence of acute fracture to the cervical spine. 2. Mild cervicothoracic dextrocurvature.  3. Mild grade 1 retrolisthesis at C3-C4, C4-C5 and C5-C6. 4. Subcentimeter sclerotic foci T1 vertebral body. Although nonspecific, sites of osseous metastatic disease cannot be excluded. A nonemergent thoracic spine MRI with contrast may be helpful for further characterization. 5. Cervical spondylosis, as described. Electronically Signed   By: Kellie Simmering D.O.    On: 05/23/2021 13:11   CT Cervical Spine Wo Contrast  Result Date: 05/23/2021 CLINICAL DATA:  Provided history: Head trauma, penetrating. Additional history provided: Recent fall, on Xarelto, nausea/vomiting. EXAM: CT HEAD WITHOUT CONTRAST CT CERVICAL SPINE WITHOUT CONTRAST TECHNIQUE: Multidetector CT imaging of the head and cervical spine was performed following the standard protocol without intravenous contrast. Multiplanar CT image reconstructions of the cervical spine were also generated. COMPARISON:  None. FINDINGS: CT HEAD FINDINGS Brain: Cerebral volume appears normal for age. There is prominent vasogenic edema within the right cerebellar hemisphere, highly suspicious for underlying metastatic lesion(s) at this site. Associated posterior fossa mass effect with partial effacement of the fourth ventricle and likely some mass effect upon the dorsal pons. No evidence of obstructive hydrocephalus at this time. The right cerebellar tonsil is displaced inferiorly below the level of foramen magnum by 4 mm. Moderate-sized focus of vasogenic edema within the left parietal lobe, suspicious for an additional site of parenchymal metastatic disease (for instance as seen on series 3, image 24). Background mild patchy and ill-defined hypoattenuation within the cerebral white matter, nonspecific but compatible chronic small vessel ischemic disease. Superimposed additional sites of mild vasogenic edema (an additional metastatic lesions) are difficult to exclude by noncontrast CT. No acute intracranial hemorrhage is identified. No acute demarcated cortical infarct. No extra-axial fluid collection. No supratentorial midline shift. Vascular: No hyperdense vessel.  Atherosclerotic calcifications. Skull: Normal. Negative for fracture or focal lesion. Sinuses/Orbits: Visualized orbits show no acute finding. Extensive partial opacification of the right frontal sinus due to the presence of fluid and mucosal thickening. Mild  mucosal thickening and possible fluid within the left frontal sinus. Scattered mucosal thickening and fluid within the bilateral ethmoid sinuses. Moderate mucosal thickening within the right maxillary sinus. Mild mucosal thickening within the left maxillary sinus. Other: Right frontotemporal scalp/periorbital hematoma. CT CERVICAL SPINE FINDINGS Alignment: Mild cervicothoracic dextrocurvature. Trace grade 1 retrolisthesis at C3-C4, C4-C5 and C5-C6. Skull base and vertebrae: The basion-dental and atlanto-dental intervals are maintained.No evidence of acute fracture to the cervical spine. Nonspecific subcentimeter sclerotic foci within the T1 vertebral body. Soft tissues and spinal canal: No prevertebral fluid or swelling. No visible canal hematoma. Subcentimeter nodule within the left thyroid lobe. Disc levels: Cervical spondylosis with multilevel disc space narrowing, disc bulges/central disc protrusions, endplate spurring and uncovertebral hypertrophy. Disc space narrowing is greatest at C4-C5 (moderate at this level). No appreciable high-grade spinal canal stenosis. Multilevel bony neural foraminal narrowing. Upper chest: No consolidation within the imaged lung apices. No visible pneumothorax. Partially imaged right chest infusion port catheter. CT head impressions #1 and #2 called by telephone at the time of interpretation on 05/23/2021 at 1:09 pm to provider Dr. Melina Copa, who verbally acknowledged these results. IMPRESSION: CT head: 1. Prominent vasogenic edema within the right cerebellar hemisphere, highly suspicious for underlying metastatic lesion(s) at this site. Associated cerebellar swelling with partial effacement of the fourth ventricle, and likely some mass effect upon the dorsal pons. No evidence of obstructive hydrocephalus at this time. Inferior displacement of the right cerebellar tonsil below the level of the foramen magnum by 4 mm. 2. Moderate-sized focus of vasogenic edema within the left parietal  lobe suspicious for an additional site of parenchymal metastatic disease. A brain MRI with contrast is recommended for further evaluation of the lesions within the left parietal lobe and right cerebellar hemisphere, and to better assess for any additional intracranial metastatic disease. 3. Mild chronic small vessel image changes within the cerebral white matter. 4. Paranasal sinus disease, as described. 5. Right frontotemporal scalp/periorbital hematoma. CT cervical spine: 1. No evidence of acute fracture to the cervical spine. 2. Mild cervicothoracic dextrocurvature. 3. Mild grade 1 retrolisthesis at C3-C4, C4-C5 and C5-C6. 4. Subcentimeter sclerotic foci T1 vertebral body. Although nonspecific, sites of osseous metastatic disease cannot be excluded. A nonemergent thoracic spine MRI with contrast may be helpful for further characterization. 5. Cervical spondylosis, as described. Electronically Signed   By: Kellie Simmering D.O.   On: 05/23/2021 13:11   MR BRAIN W WO CONTRAST  Result Date: 05/24/2021 CLINICAL DATA:  Metastatic disease evaluation EXAM: MRI HEAD WITHOUT AND WITH CONTRAST TECHNIQUE: Multiplanar, multiecho pulse sequences of the brain and surrounding structures were obtained without and with intravenous contrast. CONTRAST:  72mL GADAVIST GADOBUTROL 1 MMOL/ML IV SOLN COMPARISON:  05/23/2021 FINDINGS: Brain: Redemonstrated multiple enhancing brain masses. Right lateral cerebellar heterogeneously enhancing mass, which measures up to 3.4 x 2.5 x 2.6 cm (series 16, image 94 and series 17, image 12), with significant surrounding edema, which causes mass effect on the fourth ventricle and pons. Some hemosiderin deposition is noted within the mass, likely intratumoral hemorrhage. Anterior and inferior to this lesion, there is a 5 x 4 x 5 mm enhancing lesion (series 16, image 75 and series 17, image 13), which appears separate. Redemonstrated 2 mm left cerebellar enhancing lesion (series 16, image 71). Left  frontal lobe lesion measures 6 x 5 x 6 mm (series 16, image 162 and series 17, image 18). No significant surrounding edema. Left parietal mass measures 10 x 12 x 10 mm (series 16, image 168 and series 17, image 8), with moderate surrounding edema. No acute hemorrhage or cerebral midline shift. No hydrocephalus or extra-axial collection. Confluent T2 hyperintense signal in the periventricular white matter and pons, likely the sequela of severe chronic small vessel ischemic disease. Vascular: Normal flow voids. Skull and upper cervical spine: Normal marrow signal. Sinuses/Orbits: Mucous retention cyst in the left maxillary sinus. Mild mucosal thickening in the maxillary sinuses, right frontal sinus, and ethmoid air cells. No acute finding in the orbits. Status post bilateral lens replacements. Other: Right frontal scalp hematoma. IMPRESSION: Redemonstrated enhancing masses in the bilateral cerebellar hemispheres, left frontal lobe, and left parietal lobe. The largest of these is in the right cerebellum and has surrounding edema and mass effect on the fourth ventricle, without evidence of resulting hydrocephalus. Electronically Signed   By: Merilyn Baba M.D.   On: 05/24/2021 18:49   MR BRAIN W WO CONTRAST  Result Date: 05/23/2021 CLINICAL DATA:  Brain metastasis suspected. Fall with head trauma. Abnormal head CT. EXAM: MRI HEAD WITHOUT AND WITH CONTRAST TECHNIQUE: Multiplanar, multiecho pulse sequences of the brain and surrounding structures were obtained without and with intravenous contrast. CONTRAST:  67mL GADAVIST GADOBUTROL 1 MMOL/ML IV SOLN COMPARISON:  Head CT earlier same day. FINDINGS: Brain: Multiple brain masses. In the right lateral cerebellum, there is a partially necrotic metastasis measuring 3.7 cm in diameter with surrounding edema and mass effect. This causes marked narrowing of the fourth ventricle. 2 mm metastasis in the left cerebellum axial image 6. 7 mm metastasis in the left frontal lobe axial  image  33. Possible 2 mm metastasis just anterior to that. 13 mm metastasis in the left parietal vertex with regional vasogenic edema. Elsewhere, there chronic small-vessel ischemic changes affecting the pons and cerebral hemispheric white matter. No obstructive hydrocephalus at this moment. No extra-axial collection. Vascular: Major vessels at the base of the brain show flow. Skull and upper cervical spine: Negative Sinuses/Orbits: Mucosal inflammatory changes of the paranasal sinuses. Fluid levels in the right frontal sinus per orbits negative Other: Right frontal scalp sebaceous cyst. IMPRESSION: Multiple brain metastases. Two cerebellar metastases, the larger in the right lateral hemisphere measuring up to 3.7 cm in size with pronounced associated vasogenic edema and mass effect, resulting in narrowing of the fourth ventricle but apparently without complete ventricular obstruction at this moment. 2 mm metastasis in the left cerebellum. Two definite metastases in the left cerebral hemisphere in the left posterior frontal region and at the left parietal vertex measuring 7 mm and 13 mm respectively. Mild vasogenic edema surrounding the parietal metastasis. Possible additional 2 mm metastasis just anterior to the left frontal lesion. Paranasal sinusitis, most pronounced affecting the right frontal sinus. Electronically Signed   By: Nelson Chimes M.D.   On: 05/23/2021 15:42   CT CHEST ABDOMEN PELVIS W CONTRAST  Result Date: 05/24/2021 CLINICAL DATA:  Uterine/cervical cancer staging EXAM: CT CHEST, ABDOMEN, AND PELVIS WITH CONTRAST TECHNIQUE: Multidetector CT imaging of the chest, abdomen and pelvis was performed following the standard protocol during bolus administration of intravenous contrast. CONTRAST:  74mL OMNIPAQUE IOHEXOL 350 MG/ML SOLN COMPARISON:  Multiple priors most recent CT chest, abdomen and pelvis dated February 16, 2021 FINDINGS: CT CHEST FINDINGS Cardiovascular: No pericardial effusion. No definite  coronary artery calcifications. Minimal atherosclerotic disease of the thoracic aorta. No suspicious central filling defects of the pulmonary arteries. Right chest wall port with tip positioned near the superior cavoatrial junction. Mediastinum/Nodes: Enlarged left supraclavicular lymph nodes are no longer seen. Interval decreased size left subpectoral lymph nodes. Reference node measures 3 mm on image 16, previously 6 mm. No pathologically enlarged lymph nodes of the chest. Small hiatal hernia. Lungs/Pleura: Central airways are patent. Trace bilateral pleural effusions with associated atelectasis. No consolidation or pneumothorax. No new or enlarging pulmonary nodules. Musculoskeletal: Unchanged mild compression deformity of T5. No aggressive osseous lesions seen in the region of the chest. CT ABDOMEN PELVIS FINDINGS Hepatobiliary: No suspicious focal liver lesions. Gallbladder is unremarkable. No biliary ductal dilation. Pancreas: Unremarkable. No pancreatic ductal dilatation or surrounding inflammatory changes. Spleen: Normal in size without focal abnormality. Adrenals/Urinary Tract: Bilateral adrenal glands are unremarkable. Kidneys enhance symmetrically with no evidence of hydronephrosis or nephrolithiasis. Urinary bladder is distended with no evidence of wall thickening. Stomach/Bowel: Stomach is within normal limits. Appendix appears normal. No evidence of bowel wall thickening, distention, or inflammatory changes. Vascular/Lymphatic: Abdominal and pelvic lymph nodes are overall decreased in size when compared with prior exam. Reference right pericaval lymph node measures 9 mm on series 2, image 69, previously 1.3 cm. Several previously enlarged left periaortic lymph nodes are no longer visible. Reference right pelvic sidewall lymph node measuring 5 mm on image 88, previously 9 mm. A few left-sided pelvic and inguinal lymph nodes are increased in size. Left pelvic sidewall lymph node measuring 1.3 cm on  series 2, image 89, previously 1.1 cm. Left external iliac lymph node measuring 1.0 cm on image 92, previously measured 4 mm. Left inguinal lymph node measuring 6 mm on image 101, previously 3 mm. Reproductive: Decreased size of irregular rim enhancing  soft tissue located between the sigmoid colon and uterus, measuring 1.6 x 1.2 cm on series 2, image 86, previously measured up to 3.5 x 2.6 cm. Smaller rim enhancing lesion which was previously described in the pelvis is no longer visible. Other: No abdominopelvic ascites. Musculoskeletal: Interval mild compression deformity of the inferior endplate of L3. No aggressive appearing osseous lesions seen in the region of the abdomen or pelvis. IMPRESSION: 1. Decreased size of pelvic soft tissue lesions. 2. Decreased size of left supraclavicular and left subpectoral lymph nodes. 3. Overall decreased size of abdominal and pelvic lymph nodes; however, a few left-sided pelvic and inguinal lymph nodes have increased in size. 4. New mild compression deformity of the inferior endplate of L3. 5.  Aortic Atherosclerosis (ICD10-I70.0). Electronically Signed   By: Yetta Glassman M.D.   On: 05/24/2021 19:13    Microbiology: Recent Results (from the past 240 hour(s))  Resp Panel by RT-PCR (Flu A&B, Covid) Nasopharyngeal Swab     Status: None   Collection Time: 05/23/21  1:37 PM   Specimen: Nasopharyngeal Swab; Nasopharyngeal(NP) swabs in vial transport medium  Result Value Ref Range Status   SARS Coronavirus 2 by RT PCR NEGATIVE NEGATIVE Final    Comment: (NOTE) SARS-CoV-2 target nucleic acids are NOT DETECTED.  The SARS-CoV-2 RNA is generally detectable in upper respiratory specimens during the acute phase of infection. The lowest concentration of SARS-CoV-2 viral copies this assay can detect is 138 copies/mL. A negative result does not preclude SARS-Cov-2 infection and should not be used as the sole basis for treatment or other patient management decisions. A  negative result may occur with  improper specimen collection/handling, submission of specimen other than nasopharyngeal swab, presence of viral mutation(s) within the areas targeted by this assay, and inadequate number of viral copies(<138 copies/mL). A negative result must be combined with clinical observations, patient history, and epidemiological information. The expected result is Negative.  Fact Sheet for Patients:  EntrepreneurPulse.com.au  Fact Sheet for Healthcare Providers:  IncredibleEmployment.be  This test is no t yet approved or cleared by the [place] FDA and  has been authorized for detection and/or diagnosis of SARS-CoV-2 by FDA under an Emergency Use Authorization (EUA). This EUA will remain  in effect (meaning this test can be used) for the duration of the COVID-19 declaration under Section 564(b)(1) of the Act, 21 U.S.C.section 360bbb-3(b)(1), unless the authorization is terminated  or revoked sooner.       Influenza A by PCR NEGATIVE NEGATIVE Final   Influenza B by PCR NEGATIVE NEGATIVE Final    Comment: (NOTE) The Xpert Xpress SARS-CoV-2/FLU/RSV plus assay is intended as an aid in the diagnosis of influenza from Nasopharyngeal swab specimens and should not be used as a sole basis for treatment. Nasal washings and aspirates are unacceptable for Xpert Xpress SARS-CoV-2/FLU/RSV testing.  Fact Sheet for Patients: EntrepreneurPulse.com.au  Fact Sheet for Healthcare Providers: IncredibleEmployment.be  This test is not yet approved or cleared by the [place] FDA and has been authorized for detection and/or diagnosis of SARS-CoV-2 by FDA under an Emergency Use Authorization (EUA). This EUA will remain in effect (meaning this test can be used) for the duration of the COVID-19 declaration under Section 564(b)(1) of the Act, 21 U.S.C. section 360bbb-3(b)(1), unless the authorization  is terminated or revoked.  Performed at [facility], [place] [address] [place], [place] [address]      Labs: Basic Metabolic Panel: Recent Labs  Lab 05/23/21 1229 05/24/21 2706 05/25/21 2376  NA 125* 127* 128*  K 3.7 4.1 4.5  CL 90* 93* 94*  CO2 22 23 23   GLUCOSE 138* 106* 114*  BUN 20 34* 49*  CREATININE 1.06* 1.25* 1.45*  CALCIUM 9.6 9.4 9.4   Liver Function Tests: Recent Labs  Lab 05/23/21 1229 05/25/21 0536  AST 17 16  ALT 10 13  ALKPHOS 77 62  BILITOT 0.7 0.6  PROT 6.9 6.9  ALBUMIN 3.6 3.6   Recent Labs  Lab 05/23/21 1229  LIPASE 51   No results for input(s): AMMONIA in the last 168 hours. CBC: Recent Labs  Lab 05/23/21 1229 05/24/21 0647 05/25/21 0536  WBC 5.3 5.1 9.2  NEUTROABS 4.5  --  8.2*  HGB 10.7* 10.2* 9.6*  HCT 33.5* 31.5* 29.5*  MCV 69.2* 68.3* 69.4*  PLT 226 242 234   Cardiac Enzymes: No results for input(s): CKTOTAL, CKMB, CKMBINDEX, TROPONINI in the last 168 hours. BNP: BNP (last 3 results) No results for input(s): BNP in the last 8760 hours.  ProBNP (last 3 results) No results for input(s): PROBNP in the last 8760 hours.  CBG: No results for input(s): GLUCAP in the last 168 hours.     Signed:  Nita Sells MD   [facility] 05/25/2021, 1:18 PM

## 2021-05-25 NOTE — Progress Notes (Addendum)
Teresa Oliver   DOB:November 18, 1945   PP#:[id number]    I have seen the patient, examined her and edited the notes as follows  ASSESSMENT & PLAN:  Metastatic uterine cancer to the brain We will discontinue lenvatinib and pembrolizumab Restaging CT scan showed a mixed response to treatment; however, the patient felt that her neurological symptoms started middle of last month that would coincide with the timeframe where the patient is still on treatment.  Overall, I felt that she has progressed on this regimen I will see her next month after she completes her radiation and neurosurgery to discuss next plan of care  Metastatic disease to the brain She is on dexamethasone with improvement of her symptoms Radiation oncology, neuro-oncology, neurosurgery consulted Plan is for preop SRS to the large lesion in the right cerebellum Can transition dexamethasone from IV to p.o. and dexamethasone will be tapered per radiation oncology/neurosurgery  History of uncontrolled hypertension Due to Lenvima Her blood pressure is stable Continue medical management  Hyponatremia Could be due to intracranial process Monitor closely  Remote history of subclavian DVT She has completed 3 months worth of anticoagulation therapy The risk of bleeding outweighs the benefit of remaining on anticoagulation therapy, I will discontinue Xarelto We will start her on DVT prophylaxis  Goals of care Will address her metastatic disease while she is here  Discharge planning Hopefully within the next 1 to 2 days Will need to be evaluated by PT I am okay for her to be discharged as soon as her evaluation with radiation oncologist is completed Her outpatient appointment has been canceled and I will touch base with her after completion of radiation treatment next month for further follow-up  All questions were answered. The patient knows to call the clinic with any problems, questions or concerns.   Mikey Bussing,  NP 05/25/2021 9:38 AM [name], MD  Subjective:  Reports that she feels better today She is having less dizziness Denies headaches Denies nausea and vomiting States that she is still afraid to get up due to fear of falling I have been in communication with radiation oncologist to strategize next plan of care for the patient  Objective:  Vitals:   05/24/21 1406 05/24/21 2108  BP: 117/76 103/73  Pulse: 73 71  Resp: 14 18  Temp: (!) 97.4 F (36.3 C) (!) 97.2 F (36.2 C)  SpO2: 97% 99%     Intake/Output Summary (Last 24 hours) at 05/25/2021 6063 Last data filed at 05/25/2021 0600 Gross per 24 hour  Intake 600 ml  Output 1000 ml  Net -400 ml    GENERAL:alert, no distress and comfortable SKIN: Ecchymosis/hematoma to the right temple and right temporal area NEURO: alert & oriented x 3 with fluent speech, no focal motor/sensory deficits; I did not test her gait.  She has intentional tremor and past-pointing   Labs:  Recent Labs    04/30/21 1107 05/23/21 1229 05/24/21 0647 05/25/21 0536  NA 138 125* 127* 128*  K 4.6 3.7 4.1 4.5  CL 106 90* 93* 94*  CO2 22 22 23 23   GLUCOSE 97 138* 106* 114*  BUN 34* 20 34* 49*  CREATININE 1.34* 1.06* 1.25* 1.45*  CALCIUM 9.1 9.6 9.4 9.4  GFRNONAA 42* 55* 45* 38*  PROT 7.3 6.9  --  6.9  ALBUMIN 3.7 3.6  --  3.6  AST 29 17  --  16  ALT 35 10  --  13  ALKPHOS 118 77  --  62  BILITOT 0.4 0.7  --  0.6    Studies: I have reviewed her imaging studies CT Head Wo Contrast  Result Date: 05/23/2021 CLINICAL DATA:  Provided history: Head trauma, penetrating. Additional history provided: Recent fall, on Xarelto, nausea/vomiting. EXAM: CT HEAD WITHOUT CONTRAST CT CERVICAL SPINE WITHOUT CONTRAST TECHNIQUE: Multidetector CT imaging of the head and cervical spine was performed following the standard protocol without intravenous contrast. Multiplanar CT image reconstructions of the cervical spine were also generated. COMPARISON:  None. FINDINGS: CT  HEAD FINDINGS Brain: Cerebral volume appears normal for age. There is prominent vasogenic edema within the right cerebellar hemisphere, highly suspicious for underlying metastatic lesion(s) at this site. Associated posterior fossa mass effect with partial effacement of the fourth ventricle and likely some mass effect upon the dorsal pons. No evidence of obstructive hydrocephalus at this time. The right cerebellar tonsil is displaced inferiorly below the level of foramen magnum by 4 mm. Moderate-sized focus of vasogenic edema within the left parietal lobe, suspicious for an additional site of parenchymal metastatic disease (for instance as seen on series 3, image 24). Background mild patchy and ill-defined hypoattenuation within the cerebral white matter, nonspecific but compatible chronic small vessel ischemic disease. Superimposed additional sites of mild vasogenic edema (an additional metastatic lesions) are difficult to exclude by noncontrast CT. No acute intracranial hemorrhage is identified. No acute demarcated cortical infarct. No extra-axial fluid collection. No supratentorial midline shift. Vascular: No hyperdense vessel.  Atherosclerotic calcifications. Skull: Normal. Negative for fracture or focal lesion. Sinuses/Orbits: Visualized orbits show no acute finding. Extensive partial opacification of the right frontal sinus due to the presence of fluid and mucosal thickening. Mild mucosal thickening and possible fluid within the left frontal sinus. Scattered mucosal thickening and fluid within the bilateral ethmoid sinuses. Moderate mucosal thickening within the right maxillary sinus. Mild mucosal thickening within the left maxillary sinus. Other: Right frontotemporal scalp/periorbital hematoma. CT CERVICAL SPINE FINDINGS Alignment: Mild cervicothoracic dextrocurvature. Trace grade 1 retrolisthesis at C3-C4, C4-C5 and C5-C6. Skull base and vertebrae: The basion-dental and atlanto-dental intervals are  maintained.No evidence of acute fracture to the cervical spine. Nonspecific subcentimeter sclerotic foci within the T1 vertebral body. Soft tissues and spinal canal: No prevertebral fluid or swelling. No visible canal hematoma. Subcentimeter nodule within the left thyroid lobe. Disc levels: Cervical spondylosis with multilevel disc space narrowing, disc bulges/central disc protrusions, endplate spurring and uncovertebral hypertrophy. Disc space narrowing is greatest at C4-C5 (moderate at this level). No appreciable high-grade spinal canal stenosis. Multilevel bony neural foraminal narrowing. Upper chest: No consolidation within the imaged lung apices. No visible pneumothorax. Partially imaged right chest infusion port catheter. CT head impressions #1 and #2 called by telephone at the time of interpretation on 05/23/2021 at 1:09 pm to provider Dr. Melina Copa, who verbally acknowledged these results. IMPRESSION: CT head: 1. Prominent vasogenic edema within the right cerebellar hemisphere, highly suspicious for underlying metastatic lesion(s) at this site. Associated cerebellar swelling with partial effacement of the fourth ventricle, and likely some mass effect upon the dorsal pons. No evidence of obstructive hydrocephalus at this time. Inferior displacement of the right cerebellar tonsil below the level of the foramen magnum by 4 mm. 2. Moderate-sized focus of vasogenic edema within the left parietal lobe suspicious for an additional site of parenchymal metastatic disease. A brain MRI with contrast is recommended for further evaluation of the lesions within the left parietal lobe and right cerebellar hemisphere, and to better assess for any additional intracranial metastatic disease. 3. Mild chronic small  vessel image changes within the cerebral white matter. 4. Paranasal sinus disease, as described. 5. Right frontotemporal scalp/periorbital hematoma. CT cervical spine: 1. No evidence of acute fracture to the cervical  spine. 2. Mild cervicothoracic dextrocurvature. 3. Mild grade 1 retrolisthesis at C3-C4, C4-C5 and C5-C6. 4. Subcentimeter sclerotic foci T1 vertebral body. Although nonspecific, sites of osseous metastatic disease cannot be excluded. A nonemergent thoracic spine MRI with contrast may be helpful for further characterization. 5. Cervical spondylosis, as described. Electronically Signed   By: Kellie Simmering D.O.   On: 05/23/2021 13:11   CT Cervical Spine Wo Contrast  Result Date: 05/23/2021 CLINICAL DATA:  Provided history: Head trauma, penetrating. Additional history provided: Recent fall, on Xarelto, nausea/vomiting. EXAM: CT HEAD WITHOUT CONTRAST CT CERVICAL SPINE WITHOUT CONTRAST TECHNIQUE: Multidetector CT imaging of the head and cervical spine was performed following the standard protocol without intravenous contrast. Multiplanar CT image reconstructions of the cervical spine were also generated. COMPARISON:  None. FINDINGS: CT HEAD FINDINGS Brain: Cerebral volume appears normal for age. There is prominent vasogenic edema within the right cerebellar hemisphere, highly suspicious for underlying metastatic lesion(s) at this site. Associated posterior fossa mass effect with partial effacement of the fourth ventricle and likely some mass effect upon the dorsal pons. No evidence of obstructive hydrocephalus at this time. The right cerebellar tonsil is displaced inferiorly below the level of foramen magnum by 4 mm. Moderate-sized focus of vasogenic edema within the left parietal lobe, suspicious for an additional site of parenchymal metastatic disease (for instance as seen on series 3, image 24). Background mild patchy and ill-defined hypoattenuation within the cerebral white matter, nonspecific but compatible chronic small vessel ischemic disease. Superimposed additional sites of mild vasogenic edema (an additional metastatic lesions) are difficult to exclude by noncontrast CT. No acute intracranial hemorrhage is  identified. No acute demarcated cortical infarct. No extra-axial fluid collection. No supratentorial midline shift. Vascular: No hyperdense vessel.  Atherosclerotic calcifications. Skull: Normal. Negative for fracture or focal lesion. Sinuses/Orbits: Visualized orbits show no acute finding. Extensive partial opacification of the right frontal sinus due to the presence of fluid and mucosal thickening. Mild mucosal thickening and possible fluid within the left frontal sinus. Scattered mucosal thickening and fluid within the bilateral ethmoid sinuses. Moderate mucosal thickening within the right maxillary sinus. Mild mucosal thickening within the left maxillary sinus. Other: Right frontotemporal scalp/periorbital hematoma. CT CERVICAL SPINE FINDINGS Alignment: Mild cervicothoracic dextrocurvature. Trace grade 1 retrolisthesis at C3-C4, C4-C5 and C5-C6. Skull base and vertebrae: The basion-dental and atlanto-dental intervals are maintained.No evidence of acute fracture to the cervical spine. Nonspecific subcentimeter sclerotic foci within the T1 vertebral body. Soft tissues and spinal canal: No prevertebral fluid or swelling. No visible canal hematoma. Subcentimeter nodule within the left thyroid lobe. Disc levels: Cervical spondylosis with multilevel disc space narrowing, disc bulges/central disc protrusions, endplate spurring and uncovertebral hypertrophy. Disc space narrowing is greatest at C4-C5 (moderate at this level). No appreciable high-grade spinal canal stenosis. Multilevel bony neural foraminal narrowing. Upper chest: No consolidation within the imaged lung apices. No visible pneumothorax. Partially imaged right chest infusion port catheter. CT head impressions #1 and #2 called by telephone at the time of interpretation on 05/23/2021 at 1:09 pm to provider Dr. Melina Copa, who verbally acknowledged these results. IMPRESSION: CT head: 1. Prominent vasogenic edema within the right cerebellar hemisphere, highly  suspicious for underlying metastatic lesion(s) at this site. Associated cerebellar swelling with partial effacement of the fourth ventricle, and likely some mass effect upon the dorsal  pons. No evidence of obstructive hydrocephalus at this time. Inferior displacement of the right cerebellar tonsil below the level of the foramen magnum by 4 mm. 2. Moderate-sized focus of vasogenic edema within the left parietal lobe suspicious for an additional site of parenchymal metastatic disease. A brain MRI with contrast is recommended for further evaluation of the lesions within the left parietal lobe and right cerebellar hemisphere, and to better assess for any additional intracranial metastatic disease. 3. Mild chronic small vessel image changes within the cerebral white matter. 4. Paranasal sinus disease, as described. 5. Right frontotemporal scalp/periorbital hematoma. CT cervical spine: 1. No evidence of acute fracture to the cervical spine. 2. Mild cervicothoracic dextrocurvature. 3. Mild grade 1 retrolisthesis at C3-C4, C4-C5 and C5-C6. 4. Subcentimeter sclerotic foci T1 vertebral body. Although nonspecific, sites of osseous metastatic disease cannot be excluded. A nonemergent thoracic spine MRI with contrast may be helpful for further characterization. 5. Cervical spondylosis, as described. Electronically Signed   By: Kellie Simmering D.O.   On: 05/23/2021 13:11   MR BRAIN W WO CONTRAST  Result Date: 05/24/2021 CLINICAL DATA:  Metastatic disease evaluation EXAM: MRI HEAD WITHOUT AND WITH CONTRAST TECHNIQUE: Multiplanar, multiecho pulse sequences of the brain and surrounding structures were obtained without and with intravenous contrast. CONTRAST:  58mL GADAVIST GADOBUTROL 1 MMOL/ML IV SOLN COMPARISON:  05/23/2021 FINDINGS: Brain: Redemonstrated multiple enhancing brain masses. Right lateral cerebellar heterogeneously enhancing mass, which measures up to 3.4 x 2.5 x 2.6 cm (series 16, image 94 and series 17, image 12),  with significant surrounding edema, which causes mass effect on the fourth ventricle and pons. Some hemosiderin deposition is noted within the mass, likely intratumoral hemorrhage. Anterior and inferior to this lesion, there is a 5 x 4 x 5 mm enhancing lesion (series 16, image 75 and series 17, image 13), which appears separate. Redemonstrated 2 mm left cerebellar enhancing lesion (series 16, image 71). Left frontal lobe lesion measures 6 x 5 x 6 mm (series 16, image 162 and series 17, image 18). No significant surrounding edema. Left parietal mass measures 10 x 12 x 10 mm (series 16, image 168 and series 17, image 8), with moderate surrounding edema. No acute hemorrhage or cerebral midline shift. No hydrocephalus or extra-axial collection. Confluent T2 hyperintense signal in the periventricular white matter and pons, likely the sequela of severe chronic small vessel ischemic disease. Vascular: Normal flow voids. Skull and upper cervical spine: Normal marrow signal. Sinuses/Orbits: Mucous retention cyst in the left maxillary sinus. Mild mucosal thickening in the maxillary sinuses, right frontal sinus, and ethmoid air cells. No acute finding in the orbits. Status post bilateral lens replacements. Other: Right frontal scalp hematoma. IMPRESSION: Redemonstrated enhancing masses in the bilateral cerebellar hemispheres, left frontal lobe, and left parietal lobe. The largest of these is in the right cerebellum and has surrounding edema and mass effect on the fourth ventricle, without evidence of resulting hydrocephalus. Electronically Signed   By: Merilyn Baba M.D.   On: 05/24/2021 18:49   MR BRAIN W WO CONTRAST  Result Date: 05/23/2021 CLINICAL DATA:  Brain metastasis suspected. Fall with head trauma. Abnormal head CT. EXAM: MRI HEAD WITHOUT AND WITH CONTRAST TECHNIQUE: Multiplanar, multiecho pulse sequences of the brain and surrounding structures were obtained without and with intravenous contrast. CONTRAST:  70mL  GADAVIST GADOBUTROL 1 MMOL/ML IV SOLN COMPARISON:  Head CT earlier same day. FINDINGS: Brain: Multiple brain masses. In the right lateral cerebellum, there is a partially necrotic metastasis measuring 3.7 cm  in diameter with surrounding edema and mass effect. This causes marked narrowing of the fourth ventricle. 2 mm metastasis in the left cerebellum axial image 6. 7 mm metastasis in the left frontal lobe axial image 33. Possible 2 mm metastasis just anterior to that. 13 mm metastasis in the left parietal vertex with regional vasogenic edema. Elsewhere, there chronic small-vessel ischemic changes affecting the pons and cerebral hemispheric white matter. No obstructive hydrocephalus at this moment. No extra-axial collection. Vascular: Major vessels at the base of the brain show flow. Skull and upper cervical spine: Negative Sinuses/Orbits: Mucosal inflammatory changes of the paranasal sinuses. Fluid levels in the right frontal sinus per orbits negative Other: Right frontal scalp sebaceous cyst. IMPRESSION: Multiple brain metastases. Two cerebellar metastases, the larger in the right lateral hemisphere measuring up to 3.7 cm in size with pronounced associated vasogenic edema and mass effect, resulting in narrowing of the fourth ventricle but apparently without complete ventricular obstruction at this moment. 2 mm metastasis in the left cerebellum. Two definite metastases in the left cerebral hemisphere in the left posterior frontal region and at the left parietal vertex measuring 7 mm and 13 mm respectively. Mild vasogenic edema surrounding the parietal metastasis. Possible additional 2 mm metastasis just anterior to the left frontal lesion. Paranasal sinusitis, most pronounced affecting the right frontal sinus. Electronically Signed   By: Nelson Chimes M.D.   On: 05/23/2021 15:42   CT CHEST ABDOMEN PELVIS W CONTRAST  Result Date: 05/24/2021 CLINICAL DATA:  Uterine/cervical cancer staging EXAM: CT CHEST, ABDOMEN,  AND PELVIS WITH CONTRAST TECHNIQUE: Multidetector CT imaging of the chest, abdomen and pelvis was performed following the standard protocol during bolus administration of intravenous contrast. CONTRAST:  37mL OMNIPAQUE IOHEXOL 350 MG/ML SOLN COMPARISON:  Multiple priors most recent CT chest, abdomen and pelvis dated February 16, 2021 FINDINGS: CT CHEST FINDINGS Cardiovascular: No pericardial effusion. No definite coronary artery calcifications. Minimal atherosclerotic disease of the thoracic aorta. No suspicious central filling defects of the pulmonary arteries. Right chest wall port with tip positioned near the superior cavoatrial junction. Mediastinum/Nodes: Enlarged left supraclavicular lymph nodes are no longer seen. Interval decreased size left subpectoral lymph nodes. Reference node measures 3 mm on image 16, previously 6 mm. No pathologically enlarged lymph nodes of the chest. Small hiatal hernia. Lungs/Pleura: Central airways are patent. Trace bilateral pleural effusions with associated atelectasis. No consolidation or pneumothorax. No new or enlarging pulmonary nodules. Musculoskeletal: Unchanged mild compression deformity of T5. No aggressive osseous lesions seen in the region of the chest. CT ABDOMEN PELVIS FINDINGS Hepatobiliary: No suspicious focal liver lesions. Gallbladder is unremarkable. No biliary ductal dilation. Pancreas: Unremarkable. No pancreatic ductal dilatation or surrounding inflammatory changes. Spleen: Normal in size without focal abnormality. Adrenals/Urinary Tract: Bilateral adrenal glands are unremarkable. Kidneys enhance symmetrically with no evidence of hydronephrosis or nephrolithiasis. Urinary bladder is distended with no evidence of wall thickening. Stomach/Bowel: Stomach is within normal limits. Appendix appears normal. No evidence of bowel wall thickening, distention, or inflammatory changes. Vascular/Lymphatic: Abdominal and pelvic lymph nodes are overall decreased in size  when compared with prior exam. Reference right pericaval lymph node measures 9 mm on series 2, image 69, previously 1.3 cm. Several previously enlarged left periaortic lymph nodes are no longer visible. Reference right pelvic sidewall lymph node measuring 5 mm on image 88, previously 9 mm. A few left-sided pelvic and inguinal lymph nodes are increased in size. Left pelvic sidewall lymph node measuring 1.3 cm on series 2, image 89, previously 1.1  cm. Left external iliac lymph node measuring 1.0 cm on image 92, previously measured 4 mm. Left inguinal lymph node measuring 6 mm on image 101, previously 3 mm. Reproductive: Decreased size of irregular rim enhancing soft tissue located between the sigmoid colon and uterus, measuring 1.6 x 1.2 cm on series 2, image 86, previously measured up to 3.5 x 2.6 cm. Smaller rim enhancing lesion which was previously described in the pelvis is no longer visible. Other: No abdominopelvic ascites. Musculoskeletal: Interval mild compression deformity of the inferior endplate of L3. No aggressive appearing osseous lesions seen in the region of the abdomen or pelvis. IMPRESSION: 1. Decreased size of pelvic soft tissue lesions. 2. Decreased size of left supraclavicular and left subpectoral lymph nodes. 3. Overall decreased size of abdominal and pelvic lymph nodes; however, a few left-sided pelvic and inguinal lymph nodes have increased in size. 4. New mild compression deformity of the inferior endplate of L3. 5.  Aortic Atherosclerosis (ICD10-I70.0). Electronically Signed   By: Yetta Glassman M.D.   On: 05/24/2021 19:13

## 2021-05-25 NOTE — TOC Initial Note (Signed)
Transition of Care [facility]) - Initial/Assessment Note    Patient Details  Name: Teresa Oliver MRN: [id number] Date of Birth: [birth year]  Transition of Care [facility]) CM/SW Contact:    Lynnell Catalan, RN Phone Number: 05/25/2021, 3:40 PM  Clinical Narrative:                 Choice offered for home health and [facility] chosen. Referral given to [facility]. RW requested by pt. RW ordered from [place] to be delivered to pt room.  Expected Discharge Plan: [place] Barriers to Discharge: No Barriers Identified   Patient Goals and CMS Choice Patient states their goals for this hospitalization and ongoing recovery are:: To get stronger CMS Medicare.gov Compare Post Acute Care list provided to:: Patient Choice offered to / list presented to : Patient  Expected Discharge Plan and Services Expected Discharge Plan: [place]   Discharge Planning Services: CM Consult Post Acute Care Choice: [place] arrangements for the past 2 months: Single Family Home Expected Discharge Date: 05/25/21               DME Arranged: Gilford Rile rolling DME Agency: [employer] Date DME Agency Contacted: 05/25/21 Time DME Agency Contacted: [id number] Representative spoke with at DME Agency: [place]: PT [place]: [place] Date [place]: 05/25/21 Time [place]: 22 Representative spoke with at [place]: [facility] Arrangements/Services Living arrangements for the past 2 months: [place] Lives with:: Spouse Patient language and need for interpreter reviewed:: Yes Do you feel safe going back to the place where you live?: Yes      Need for Family Participation in Patient Care: Yes (Comment) Care giver support system in place?: Yes (comment)   Criminal Activity/Legal Involvement Pertinent to Current Situation/Hospitalization: No - Comment as needed  Activities of Daily Living Home Assistive  Devices/Equipment: Cane (specify quad or straight) ADL Screening (condition at time of admission) Patient's cognitive ability adequate to safely complete daily activities?: Yes Is the patient deaf or have difficulty hearing?: No Does the patient have difficulty seeing, even when wearing glasses/contacts?: No Does the patient have difficulty concentrating, remembering, or making decisions?: Yes Patient able to express need for assistance with ADLs?: Yes Does the patient have difficulty dressing or bathing?: Yes Independently performs ADLs?: No Communication: Independent Dressing (OT): Needs assistance Is this a change from baseline?: Change from baseline, expected to last >3 days Grooming: Independent Feeding: Independent Bathing: Needs assistance Is this a change from baseline?: Change from baseline, expected to last >3 days Toileting: Needs assistance Is this a change from baseline?: Change from baseline, expected to last >3days In/Out Bed: Needs assistance Is this a change from baseline?: Change from baseline, expected to last >3 days Walks in Home: Needs assistance Is this a change from baseline?: Change from baseline, expected to last >3 days Does the patient have difficulty walking or climbing stairs?: Yes Weakness of Legs: None Weakness of Arms/Hands: None  Permission Sought/Granted Permission sought to share information with : Chartered certified accountant granted to share information with : Yes, Verbal Permission Granted     Permission granted to share info w AGENCY: [place]        Emotional Assessment Appearance:: Appears stated age Attitude/Demeanor/Rapport: Gracious Affect (typically observed): Calm Orientation: : Oriented to Self, Oriented to Place, Oriented to  Time, Oriented to Situation Alcohol / Substance Use: Not Applicable Psych Involvement: No (comment)  Admission diagnosis:  Brain metastases ([place]) [C79.31] Brain lesion [G93.9] Injury of  head, initial encounter [S09.90XA] Malignant neoplasm of uterus, unspecified site [facility]) [C55] Patient Active Problem List   Diagnosis Date Noted   Brain metastases ([place]) 05/23/2021   DNR (do not resuscitate) 05/23/2021   Acute venous embolism and thrombosis of internal jugular veins ([place]) 02/19/2021   Cancer associated pain 02/19/2021   Goals of care, counseling/discussion 02/19/2021   Essential hypertension 10/27/2020   Anxiety, generalized 09/11/2020   CKD (chronic kidney disease), stage III ([place]) 09/07/2020   Deficiency anemia 09/04/2020   Uterine cancer ([place]) 09/01/2020   Abdominal pain, generalized 08/31/2020   Malnutrition of moderate degree 08/29/2020   Pelvic mass 08/28/2020   Hydroureteronephrosis 08/28/2020   Hypokalemia 08/28/2020   Hypomagnesemia 08/28/2020   Hyponatremia 08/28/2020   Hyperglycemia 08/28/2020   AKI (acute kidney injury) ([place]) 08/28/2020   PCP:  [facility], [place]:   [facility] [place], [place] [address] [place] [address] [place] [address] Phone: [phone] Fax: [place] [address] Phone: [phone] Fax: [phone]     Social Determinants of Health (SDOH) Interventions    Readmission Risk Interventions Readmission Risk Prevention Plan 05/25/2021  Transportation Screening Complete  PCP or Specialist Appt within 3-5 Days Complete  HRI or [place] Complete  Social Work Consult for [place] Planning/Counseling Complete  Palliative Care Screening Not Applicable  Medication Review Press photographer) Complete  Some recent data might be hidden

## 2021-05-25 NOTE — Telephone Encounter (Signed)
Patient is approved for [place] at no cost from [facility] 05/20/21-05/19/22  [place] uses [place] Patient [place] Phone [phone] Fax [phone] 05/25/2021 6:02 PM

## 2021-05-25 NOTE — Evaluation (Signed)
Physical Therapy Evaluation Patient Details Name: Teresa Oliver MRN: [id number] DOB: 1945-07-13 Today's Date: 05/25/2021  History of Present Illness  76 y.o. female with medical history significant of HTN; HLD; thalassemia; and uterine cancer presenting with fall, on Xarelto. She had a bad case of COVID for 2-3 weeks (tested positive on 12/18) and inoperable uterine cancer.  COVID made her very dizzy and she had a fall trying to get into the bathroom and hit her head on the bathtub on 12/31.  She has been having continued dizziness, ataxia, vomiting. Imaging showed new brain metastases.  Clinical Impression  Pt admitted with above diagnosis. Pt ambulated 66' with RW with min assist for balance. Pt has a posterior lean in standing and is a fall risk. Hands on assist and RW recommended for mobility.  Pt currently with functional limitations due to the deficits listed below (see PT Problem List). Pt will benefit from skilled PT to increase their independence and safety with mobility to allow discharge to the venue listed below.          Recommendations for follow up therapy are one component of a multi-disciplinary discharge planning process, led by the attending physician.  Recommendations may be updated based on patient status, additional functional criteria and insurance authorization.  Follow Up Recommendations Home health PT    Assistance Recommended at Discharge Intermittent Supervision/Assistance  Patient can return home with the following  A little help with walking and/or transfers;Assist for transportation;Help with stairs or ramp for entrance    Equipment Recommendations Rolling walker (2 wheels)  Recommendations for Other Services       Functional Status Assessment Patient has had a recent decline in their functional status and demonstrates the ability to make significant improvements in function in a reasonable and predictable amount of time.     Precautions / Restrictions  Precautions Precautions: Fall Precaution Comments: fell just PTA, no other falls in past 6 months Restrictions Weight Bearing Restrictions: No      Mobility  Bed Mobility                    Transfers Overall transfer level: Needs assistance Equipment used: Rolling walker (2 wheels) Transfers: Sit to/from Stand Sit to Stand: Min assist           General transfer comment: assist to steady 2* posterior bias, posterior lean initially upon standing    Ambulation/Gait Ambulation/Gait assistance: Min assist Gait Distance (Feet): 80 Feet Assistive device: Rolling walker (2 wheels) Gait Pattern/deviations: Step-through pattern;Decreased stride length Gait velocity: WFL     General Gait Details: min A for balance and to manage RW  Stairs            Wheelchair Mobility    Modified Rankin (Stroke Patients Only)       Balance Overall balance assessment: Needs assistance   Sitting balance-Leahy Scale: Good     Standing balance support: Bilateral upper extremity supported Standing balance-Leahy Scale: Poor Standing balance comment: posterior lean in standing requiring min A initially, then fair                             Pertinent Vitals/Pain Pain Assessment: No/denies pain    Home Living Family/patient expects to be discharged to:: Private residence Living Arrangements: Spouse/significant other;Children Available Help at Discharge: Family;Available PRN/intermittently   Home Access: Stairs to enter   Entrance Stairs-Number of Steps: 2   Home Layout: One level  Home Equipment: Kasandra Knudsen - single point Additional Comments: lives with spouse who has visual impairment, and with son who works    Prior Function Prior Level of Function : Independent/Modified Independent             Mobility Comments: very active, worked out       [place]: Right    Extremity/Trunk Assessment   Upper Extremity Assessment Upper  Extremity Assessment: Overall WFL for tasks assessed    Lower Extremity Assessment Lower Extremity Assessment: Overall WFL for tasks assessed    Cervical / Trunk Assessment Cervical / Trunk Assessment: Normal  Communication   Communication: No difficulties  Cognition Arousal/Alertness: Awake/alert Behavior During Therapy: WFL for tasks assessed/performed Overall Cognitive Status: Within Functional Limits for tasks assessed                                          General Comments      Exercises     Assessment/Plan    PT Assessment Patient needs continued PT services  PT Problem List Decreased mobility;Decreased balance;Decreased activity tolerance;Decreased knowledge of use of DME       PT Treatment Interventions Therapeutic activities;Balance training;Functional mobility training;Patient/family education;Therapeutic exercise;DME instruction    PT Goals (Current goals can be found in the Care Plan section)  Acute Rehab PT Goals Patient Stated Goal: return to working out at the gym PT Goal Formulation: With patient/family Time For Goal Achievement: 06/08/21 Potential to Achieve Goals: Good    Frequency Min 3X/week     Co-evaluation               AM-PAC PT "6 Clicks" Mobility  Outcome Measure Help needed turning from your back to your side while in a flat bed without using bedrails?: None Help needed moving from lying on your back to sitting on the side of a flat bed without using bedrails?: None Help needed moving to and from a bed to a chair (including a wheelchair)?: A Little Help needed standing up from a chair using your arms (e.g., wheelchair or bedside chair)?: A Little Help needed to walk in hospital room?: A Little Help needed climbing 3-5 steps with a railing? : A Little 6 Click Score: 20    End of Session Equipment Utilized During Treatment: Gait belt Activity Tolerance: Patient tolerated treatment well Patient left: in  chair;with call bell/phone within reach;with family/visitor present Nurse Communication: Mobility status PT Visit Diagnosis: Unsteadiness on feet (R26.81);History of falling (Z91.81)    Time: 6659-9357 PT Time Calculation (min) (ACUTE ONLY): 18 min   Charges:   PT Evaluation $PT Eval Moderate Complexity: 1 Mod         Philomena Doheny PT 05/25/2021  Acute Rehabilitation Services Pager [phone] Office [phone]

## 2021-05-29 ENCOUNTER — Telehealth: Payer: Self-pay

## 2021-05-29 NOTE — Telephone Encounter (Signed)
Called and left a message asking her to call the office back. 

## 2021-05-29 NOTE — Pre-Procedure Instructions (Signed)
Surgical Instructions    Your procedure is scheduled on Friday 06/01/21.   Report to [facility] Main Entrance "A" at 10:45 A.M., then check in with the Admitting office.  Call this number if you have problems the morning of surgery:  [phone]   If you have any questions prior to your surgery date call [phone]: Open Monday-Friday 8am-4pm    Remember:  Do not eat after midnight the night before your surgery  You may drink clear liquids until 09:45 A.M. the morning of your surgery.   Clear liquids allowed are: Water, Non-Citrus Juices (without pulp), Carbonated Beverages, Clear Tea, Black Coffee Only, and Gatorade    Take these medicines the morning of surgery with A SIP OF WATER   amLODipine (NORVASC)   atenolol (TENORMIN)  dexamethasone (DECADRON)  doxazosin (CARDURA)   LORazepam (ATIVAN)- If needed  ondansetron [facility])- If needed  oxybutynin (DITROPAN)- If needed  oxyCODONE (OXY IR/ROXICODONE)- If needed  prochlorperazine (COMPAZINE)- If needed  As of today, STOP taking any Aspirin (unless otherwise instructed by your surgeon) Aleve, Naproxen, Ibuprofen, Motrin, Advil, Goody's, BC's, all herbal medications, fish oil, and all vitamins.                     Do NOT Smoke (Tobacco/Vaping) or drink Alcohol 24 hours prior to your procedure.  If you use a CPAP at night, you may bring all equipment for your overnight stay.   Contacts, glasses, piercing's, hearing aid's, dentures or partials may not be worn into surgery, please bring cases for these belongings.    For patients admitted to the hospital, discharge time will be determined by your treatment team.   Patients discharged the day of surgery will not be allowed to drive home, and someone needs to stay with them for 24 hours.  NO VISITORS WILL BE ALLOWED IN PRE-OP WHERE PATIENTS GET READY FOR SURGERY.  ONLY 1 SUPPORT PERSON MAY BE PRESENT IN THE WAITING ROOM WHILE YOU ARE IN SURGERY.  IF YOU ARE TO BE ADMITTED, ONCE  YOU ARE IN YOUR ROOM YOU WILL BE ALLOWED TWO (2) VISITORS.  Minor children may have two parents present. Special consideration for safety and communication needs will be reviewed on a case by case basis.   Special instructions:   [place]- Preparing For Surgery  Before surgery, you can play an important role. Because skin is not sterile, your skin needs to be as free of germs as possible. You can reduce the number of germs on your skin by washing with CHG (chlorahexidine gluconate) Soap before surgery.  CHG is an antiseptic cleaner which kills germs and bonds with the skin to continue killing germs even after washing.    Oral Hygiene is also important to reduce your risk of infection.  Remember - BRUSH YOUR TEETH THE MORNING OF SURGERY WITH YOUR REGULAR TOOTHPASTE  Please do not use if you have an allergy to CHG or antibacterial soaps. If your skin becomes reddened/irritated stop using the CHG.  Do not shave (including legs and underarms) for at least 48 hours prior to first CHG shower. It is OK to shave your face.  Please follow these instructions carefully.   Shower the NIGHT BEFORE SURGERY and the MORNING OF SURGERY  If you chose to wash your hair, wash your hair first as usual with your normal shampoo.  After you shampoo, rinse your hair and body thoroughly to remove the shampoo.  Use CHG Soap as you would any other liquid  soap. You can apply CHG directly to the skin and wash gently with a scrungie or a clean washcloth.   Apply the CHG Soap to your body ONLY FROM THE NECK DOWN.  Do not use on open wounds or open sores. Avoid contact with your eyes, ears, mouth and genitals (private parts). Wash Face and genitals (private parts)  with your normal soap.   Wash thoroughly, paying special attention to the area where your surgery will be performed.  Thoroughly rinse your body with warm water from the neck down.  DO NOT shower/wash with your normal soap after using and rinsing off the  CHG Soap.  Pat yourself dry with a CLEAN TOWEL.  Wear CLEAN PAJAMAS to bed the night before surgery  Place CLEAN SHEETS on your bed the night before your surgery  DO NOT SLEEP WITH PETS.   Day of Surgery: Shower with CHG soap. Do not wear jewelry, make up, nail polish, gel polish, artificial nails, or any other type of covering on natural nails including finger and toenails. If patients have artificial nails, gel coating, etc. that need to be removed by a nail salon please have this removed prior to surgery. Surgery may need to be canceled/delayed if the surgeon/ anesthesia feels like the patient is unable to be adequately monitored. Do not wear lotions, powders, perfumes/colognes, or deodorant. Do not shave 48 hours prior to surgery.  Men may shave face and neck. Do not bring valuables to the hospital. [facility] is not responsible for any belongings or valuables. Wear Clean/Comfortable clothing the morning of surgery Remember to brush your teeth WITH YOUR REGULAR TOOTHPASTE.   Please read over the following fact sheets that you were given.   3 days prior to your procedure or After your COVID test   You are not required to quarantine however you are required to wear a well-fitting mask when you are out and around people not in your household. If your mask becomes wet or soiled, replace with a new one.   Wash your hands often with soap and water for 20 seconds or clean your hands with an alcohol-based hand sanitizer that contains at least 60% alcohol.   Do not share personal items.   Notify your provider:  o if you are in close contact with someone who has COVID  o or if you develop a fever of 100.4 or greater, sneezing, cough, sore throat, shortness of breath or body aches.

## 2021-05-29 NOTE — Telephone Encounter (Signed)
Called her back. She is doing well. Eating and drinking. Home health PT is coming out this afternoon. She is questioning how to take bp medications. Attempted to clarify with her. She does not want to do it on the phone. She will have her daughter send a message thru mychart with her bp medications to clarify.

## 2021-05-29 NOTE — Telephone Encounter (Signed)
Returned her call. She is clarifying how to take Norvasc 10 mg and Atenolol 50 mg. Instructed per previous instructions to take daily. She verbalized understanding.

## 2021-05-29 NOTE — Telephone Encounter (Signed)
-----   Message from [name], MD sent at 05/29/2021  7:13 AM EST ----- Can you call and check on her? She left the hospital in the weekend

## 2021-05-30 ENCOUNTER — Other Ambulatory Visit: Payer: Self-pay

## 2021-05-30 ENCOUNTER — Encounter (HOSPITAL_COMMUNITY): Payer: Self-pay

## 2021-05-30 ENCOUNTER — Encounter (HOSPITAL_COMMUNITY)
Admission: RE | Admit: 2021-05-30 | Discharge: 2021-05-30 | Disposition: A | Discharge status: Home / Self Care | Payer: Medicare Other | Source: Ambulatory Visit | Attending: Neurological Surgery | Admitting: Neurological Surgery

## 2021-05-30 VITALS — BP 120/87 | HR 74 | Temp 98.4°F | Resp 18 | Ht 60.0 in | Wt 110.5 lb

## 2021-05-30 DIAGNOSIS — Z01818 Encounter for other preprocedural examination: Secondary | ICD-10-CM

## 2021-05-30 DIAGNOSIS — E785 Hyperlipidemia, unspecified: Secondary | ICD-10-CM | POA: Insufficient documentation

## 2021-05-30 DIAGNOSIS — Z20822 Contact with and (suspected) exposure to covid-19: Secondary | ICD-10-CM | POA: Insufficient documentation

## 2021-05-30 DIAGNOSIS — K589 Irritable bowel syndrome without diarrhea: Secondary | ICD-10-CM | POA: Insufficient documentation

## 2021-05-30 DIAGNOSIS — E119 Type 2 diabetes mellitus without complications: Secondary | ICD-10-CM | POA: Insufficient documentation

## 2021-05-30 DIAGNOSIS — Z01812 Encounter for preprocedural laboratory examination: Secondary | ICD-10-CM | POA: Insufficient documentation

## 2021-05-30 DIAGNOSIS — D496 Neoplasm of unspecified behavior of brain: Secondary | ICD-10-CM | POA: Insufficient documentation

## 2021-05-30 DIAGNOSIS — D649 Anemia, unspecified: Secondary | ICD-10-CM | POA: Insufficient documentation

## 2021-05-30 DIAGNOSIS — C55 Malignant neoplasm of uterus, part unspecified: Secondary | ICD-10-CM | POA: Insufficient documentation

## 2021-05-30 DIAGNOSIS — E871 Hypo-osmolality and hyponatremia: Secondary | ICD-10-CM | POA: Insufficient documentation

## 2021-05-30 DIAGNOSIS — I10 Essential (primary) hypertension: Secondary | ICD-10-CM | POA: Insufficient documentation

## 2021-05-30 DIAGNOSIS — D569 Thalassemia, unspecified: Secondary | ICD-10-CM | POA: Insufficient documentation

## 2021-05-30 HISTORY — DX: Pneumonia, unspecified organism: J18.9

## 2021-05-30 HISTORY — DX: Type 2 diabetes mellitus without complications: E11.9

## 2021-05-30 HISTORY — DX: Anxiety disorder, unspecified: F41.9

## 2021-05-30 HISTORY — DX: Depression, unspecified: F32.A

## 2021-05-30 LAB — CBC
HCT: 32.3 % — ABNORMAL LOW (ref 36.0–46.0)
Hemoglobin: 9.9 g/dL — ABNORMAL LOW (ref 12.0–15.0)
MCH: 22.1 pg — ABNORMAL LOW (ref 26.0–34.0)
MCHC: 30.7 g/dL (ref 30.0–36.0)
MCV: 72.1 fL — ABNORMAL LOW (ref 80.0–100.0)
Platelets: 205 10*3/uL (ref 150–400)
RBC: 4.48 MIL/uL (ref 3.87–5.11)
RDW: 15.9 % — ABNORMAL HIGH (ref 11.5–15.5)
WBC: 8 10*3/uL (ref 4.0–10.5)
nRBC: 0 % (ref 0.0–0.2)

## 2021-05-30 LAB — BASIC METABOLIC PANEL
Anion gap: 9 (ref 5–15)
BUN: 43 mg/dL — ABNORMAL HIGH (ref 8–23)
CO2: 22 mmol/L (ref 22–32)
Calcium: 9.2 mg/dL (ref 8.9–10.3)
Chloride: 102 mmol/L (ref 98–111)
Creatinine, Ser: 1.31 mg/dL — ABNORMAL HIGH (ref 0.44–1.00)
GFR, Estimated: 42 mL/min — ABNORMAL LOW (ref >60)
Glucose, Bld: 122 mg/dL — ABNORMAL HIGH (ref 70–99)
Potassium: 4.1 mmol/L (ref 3.5–5.1)
Sodium: 133 mmol/L — ABNORMAL LOW (ref 135–145)

## 2021-05-30 LAB — SURGICAL PCR SCREEN
MRSA, PCR: NEGATIVE
Staphylococcus aureus: POSITIVE — AB

## 2021-05-30 LAB — GLUCOSE, CAPILLARY: Glucose-Capillary: 126 mg/dL — ABNORMAL HIGH (ref 70–99)

## 2021-05-30 LAB — HEMOGLOBIN A1C
Hgb A1c MFr Bld: 6 % — ABNORMAL HIGH (ref 4.8–5.6)
Mean Plasma Glucose: 125.5 mg/dL

## 2021-05-30 LAB — SARS CORONAVIRUS 2 (TAT 6-24 HRS): SARS Coronavirus 2: NEGATIVE

## 2021-05-30 NOTE — Progress Notes (Signed)
Anesthesia Chart Review:  Case: [id number] Date/Time: 06/01/21 1233   Procedure: Right suboccipital craniectomy for tumor resection (Right) - RM 21   Anesthesia type: General   Pre-op diagnosis: Brain tumor   Location: [place] OR ROOM 21 / [place] [facility]   Surgeons: Kristeen Miss, MD       DISCUSSION: Patient is a 76 year old female scheduled for the above procedure.  History includes DM2, HTN, HLD, IBS, thalassemia minor, stage IV uterine cancer (see summary of events below), anemia. She denied have diabetes history.  Muddy admission 08/28/20-09/04/20 after presenting to ED with abnormal labs with intermittent abdominal pain. She had labs at [facility] that showed low potassium and sodium, anemia, and elevated WBC. CT showed a large pelvic mass contiguous with the sigmoid colon and uterus with partial obstruction of the left ureter, multifocal lymphadenopathy.  GI, oncology, general surgery, interventional radiology, and urology consulted.  CT-guided biopsy performed on 08/30/2020 showed a poorly differentiated carcinoma with necrosis.  The  morphology and immunophenotype were most consistent with a gynecologic primary including high-grade serous carcinoma. 08/29/20 CA 125 was 966, CEA less than 0.3, CA 19.9 was 7.  Colonoscopy on 08/31/2020 showed a large mass causing extrinsic compression of partial obstruction of the sigmoid colon.  Mass thought to be metastatic from pelvic malignancy.  Port was placed  on 09/01/20 for chemotherapy for uterine cancer. She initially had good response, but her follow-up CT chest/abd/pelvis on 02/16/21 showed interval progression of lymphadenopathy and metastatic deposits in the central pelvis. There was also an incidental findings of thrombus in the left subclavian/innominate vein, and she was started on Rivaroxaban, but discontinued during her 05/23/21-05/25/21 admission for dizziness with fall. She initially felt dizziness was related to recent COVID-19 (+ 05/06/21), but head CT  suspicious for metastatic brain disease. Neurosurgery and RAD-ONC were consulted. She was started on Decadron with plans for radiation and surgery.   05/30/21 presurgical COVID-19 test was negative. She reported last chemotherapy ~ 5-6 weeks ago. 05/30/21 labs appear stable. HGB is up to 9.9. T&S done with labs. Anesthesia team to evaluate on the day of surgery.    VS: BP 120/87    Pulse 74    Temp 36.9 C (Oral)    Resp 18    Ht 5' (1.524 m)    Wt 50.1 kg    SpO2 100%    BMI 21.58 kg/m    PROVIDERS: Anda Kraft, MD is PCP ([facility]) Heywood Iles, MD is HEM-ONC Lieutenant Diego, MD is RAD-ONC   LABS: Preoperative labs noted. CBC and BMET repeated at PAT due to HGB < 10 and NA < 130 on 05/25/21. Na now 133. H/H 9.9/32.3 but up slightly over the past week. PLT count 205K. Cr 1.31, down from 1.45.  (all labs ordered are listed, but only abnormal results are displayed)   Labs Reviewed  SURGICAL PCR SCREEN - Abnormal; Notable for the following components:      Result Value   Staphylococcus aureus POSITIVE (*)    All other components within normal limits  BASIC METABOLIC PANEL - Abnormal; Notable for the following components:   Sodium 133 (*)    Glucose, Bld 122 (*)    BUN 43 (*)    Creatinine, Ser 1.31 (*)    GFR, Estimated 42 (*)    All other components within normal limits  CBC - Abnormal; Notable for the following components:   Hemoglobin 9.9 (*)    HCT 32.3 (*)    MCV  72.1 (*)    MCH 22.1 (*)    RDW 15.9 (*)    All other components within normal limits  HEMOGLOBIN A1C - Abnormal; Notable for the following components:   Hgb A1c MFr Bld 6.0 (*)    All other components within normal limits  GLUCOSE, CAPILLARY - Abnormal; Notable for the following components:   Glucose-Capillary 126 (*)    All other components within normal limits  SARS CORONAVIRUS 2 (TAT 6-24 HRS)  TYPE AND SCREEN     IMAGES: MRI Brain 05/24/21: IMPRESSION: Redemonstrated enhancing  masses in the bilateral cerebellar hemispheres, left frontal lobe, and left parietal lobe. The largest of these is in the right cerebellum and has surrounding edema and mass effect on the fourth ventricle, without evidence of resulting hydrocephalus.   CT chest/abd/pelvis 05/24/21: IMPRESSION: 1. Decreased size of pelvic soft tissue lesions. 2. Decreased size of left supraclavicular and left subpectoral lymph nodes. 3. Overall decreased size of abdominal and pelvic lymph nodes; however, a few left-sided pelvic and inguinal lymph nodes have increased in size. 4. New mild compression deformity of the inferior endplate of L3. 5.  Aortic Atherosclerosis (ICD10-I70.0).   EKG: 09/01/20 Sinus tachycardia at 114 bpm with short PR Nonspecific ST and T wave abnormality Abnormal ECG No old tracing to compare Confirmed by Thompson Grayer ([id number]) on 09/01/2020 10:31:59 PM   CV: N/A  Past Medical History:  Diagnosis Date   Anemia    chronic   Anxiety    Depression    Diabetes mellitus without complication ([place])    Type 2 listed in chart 08/2020   Hyperlipemia    Hypertension    Irritable bowel syndrome (IBS)    Pneumonia    Thalassemia    Uterine cancer [facility])     Past Surgical History:  Procedure Laterality Date   BIOPSY  08/31/2020   Procedure: BIOPSY;  Surgeon: Wilford Corner, MD;  Location: [facility] ENDOSCOPY;  Service: Endoscopy;;   BREAST EXCISIONAL BIOPSY Left    1985 benign   COLONOSCOPY N/A 08/31/2020   Procedure: COLONOSCOPY;  Surgeon: Wilford Corner, MD;  Location: [facility] ENDOSCOPY;  Service: Endoscopy;  Laterality: N/A;   HERNIA REPAIR     IR IMAGING GUIDED PORT INSERTION  09/01/2020   SUBMUCOSAL TATTOO INJECTION  08/31/2020   Procedure: SUBMUCOSAL TATTOO INJECTION;  Surgeon: Wilford Corner, MD;  Location: [facility] ENDOSCOPY;  Service: Endoscopy;;    MEDICATIONS:  amLODipine (NORVASC) 10 MG tablet   atenolol (TENORMIN) 50 MG tablet   cholecalciferol (VITAMIN D3) 25 MCG (1000  UNIT) tablet   dexamethasone (DECADRON) 4 MG tablet   doxazosin (CARDURA) 4 MG tablet   lidocaine-prilocaine (EMLA) cream   LORazepam (ATIVAN) 0.5 MG tablet   MAGNESIUM-OXIDE 400 (240 Mg) MG tablet   ondansetron (ZOFRAN) 8 MG tablet   oxybutynin (DITROPAN) 5 MG tablet   oxyCODONE (OXY IR/ROXICODONE) 5 MG immediate release tablet   polyethylene glycol (MIRALAX / GLYCOLAX) 17 g packet   prochlorperazine (COMPAZINE) 10 MG tablet   senna-docusate (SENOKOT-S) 8.6-50 MG tablet   No current facility-administered medications for this encounter.    Myra Gianotti, PA-C Surgical Short Stay/Anesthesiology [facility] Phone [phone] [facility] Phone [phone] 05/31/2021 10:21 AM

## 2021-05-30 NOTE — Progress Notes (Signed)
PCP - [facility]- Dr. Juanetta Snow. Patient states she now uses Dr. [name] (Oncologist) as her PCP.  Cardiologist - denies  PPM/ICD - n/a  Chest x-ray - CT Chest 05/24/21 EKG - 09/01/20 Stress Test - denies ECHO - denies Cardiac Cath - denies  Sleep Study - denies CPAP - n/a  Fasting Blood Sugar - 126 Checks Blood Sugar zero times a day- Patient denies having diabetes. Type 2 DM listed in chart note on 09/03/20 by Dr. Marthenia Rolling  Blood Thinner Instructions: n/a Aspirin Instructions: n/a  ERAS Protcol - Yes.  PRE-SURGERY Ensure or G2- None ordered.   COVID TEST- 05/30/21. Pending.    Anesthesia review: Yes. Abnormal labs 05/25/21. Repeated at PAT today. Ok to repeat per A. Golden Pop, PA and ok to obtain BMP, no CMP needed per [place].   Patient denies shortness of breath, fever, cough and chest pain at PAT appointment   All instructions explained to the patient, with a verbal understanding of the material. Patient agrees to go over the instructions while at home for a better understanding. Patient also instructed to self quarantine after being tested for COVID-19. The opportunity to ask questions was provided.

## 2021-05-31 ENCOUNTER — Ambulatory Visit
Admission: RE | Admit: 2021-05-31 | Discharge: 2021-05-31 | Disposition: A | Discharge status: Home / Self Care | Payer: Medicare Other | Source: Ambulatory Visit | Attending: Radiation Oncology | Admitting: Radiation Oncology

## 2021-05-31 ENCOUNTER — Encounter (HOSPITAL_COMMUNITY): Payer: Self-pay | Admitting: Neurological Surgery

## 2021-05-31 ENCOUNTER — Other Ambulatory Visit: Payer: Medicare Other

## 2021-05-31 ENCOUNTER — Encounter: Payer: Self-pay | Admitting: Radiation Oncology

## 2021-05-31 ENCOUNTER — Other Ambulatory Visit: Payer: Self-pay | Admitting: Radiation Therapy

## 2021-05-31 ENCOUNTER — Ambulatory Visit (HOSPITAL_COMMUNITY): Payer: Medicare Other

## 2021-05-31 ENCOUNTER — Encounter: Payer: Self-pay | Admitting: Radiation Therapy

## 2021-05-31 ENCOUNTER — Other Ambulatory Visit: Payer: Self-pay

## 2021-05-31 VITALS — BP 135/86 | HR 73 | Temp 98.1°F | Resp 20

## 2021-05-31 DIAGNOSIS — C7931 Secondary malignant neoplasm of brain: Secondary | ICD-10-CM

## 2021-05-31 DIAGNOSIS — Z510 Encounter for antineoplastic radiation therapy: Secondary | ICD-10-CM | POA: Diagnosis present

## 2021-05-31 DIAGNOSIS — C55 Malignant neoplasm of uterus, part unspecified: Secondary | ICD-10-CM | POA: Diagnosis not present

## 2021-05-31 NOTE — Addendum Note (Signed)
Encounter addended by: Sharol Given, RN on: 05/31/2021 5:16 PM  Actions taken: Clinical Note Signed

## 2021-05-31 NOTE — Progress Notes (Signed)
[place] Brain:  Patient alert and verbally responsive denies headache, blurred vision, and nausea.  Observation time 30 minutes.  Gait steady as she left clinic with family member.

## 2021-05-31 NOTE — Progress Notes (Signed)
°  Radiation Oncology         [phone] ________________________________  Stereotactic Treatment Procedure Note  Name: Teresa Oliver MRN: [id number]  Date: 05/31/2021  DOB: 1945/11/16  SPECIAL TREATMENT PROCEDURE    ICD-10-CM   1. Brain metastases ([place])  C79.31       3D TREATMENT PLANNING AND DOSIMETRY:  The patient's radiation plan was reviewed and approved by neurosurgery and radiation oncology prior to treatment.  It showed 3-dimensional radiation distributions overlaid onto the planning CT/MRI image set.  The [facility] for the target structures as well as the organs at risk were reviewed. The documentation of the 3D plan and dosimetry are filed in the radiation oncology EMR.  NARRATIVE:  OLIVIYA GILKISON was brought to the TrueBeam stereotactic radiation treatment machine and placed supine on the CT couch. The head frame was applied, and the patient was set up for stereotactic radiosurgery.  Neurosurgery was present for the set-up and delivery  SIMULATION VERIFICATION:  In the couch zero-angle position, the patient underwent Exactrac imaging using the Brainlab system with orthogonal KV images.  These were carefully aligned and repeated to confirm treatment position for each of the isocenters.  The Exactrac snap film verification was repeated at each couch angle.  PROCEDURE: [name] received stereotactic radiosurgery to the following five targets:   These targets were treated using single isocenter with 6 Rapid Arc VMAT Beams to a prescription dose of 15 Gy for PTV1 and 20 Gy for PTVs 2-5.  ExacTrac registration was performed for each couch angle.  The 100% isodose line was prescribed.  6 MV X-rays were delivered in the flattening filter free beam mode.  STEREOTACTIC TREATMENT MANAGEMENT:  Following delivery, the patient was transported to nursing in stable condition and monitored for possible acute effects.  Vital signs were recorded There were no vitals taken for this visit.. The  patient tolerated treatment without significant acute effects, and was discharged to home in stable condition.    PLAN: The patient will proceed to resection of the dominant Right Cerebellar metastasis (PTV1) tomorrow.  She will then follow-up in one month.  ________________________________  Sheral Apley. Tammi Klippel, M.D.

## 2021-05-31 NOTE — Anesthesia Preprocedure Evaluation (Addendum)
Anesthesia Evaluation  Patient identified by MRN, date of birth, ID band Patient awake    Reviewed: Allergy & Precautions, H&P , NPO status , Patient's Chart, lab work & pertinent test results  Airway Mallampati: II   Neck ROM: full    Dental   Pulmonary neg pulmonary ROS,    breath sounds clear to auscultation       Cardiovascular hypertension,  Rhythm:regular Rate:Normal     Neuro/Psych PSYCHIATRIC DISORDERS Anxiety Depression    GI/Hepatic   Endo/Other  diabetes  Renal/GU Renal InsufficiencyRenal disease     Musculoskeletal   Abdominal   Peds  Hematology  (+) Blood dyscrasia, anemia ,   Anesthesia Other Findings   Reproductive/Obstetrics                            Anesthesia Physical Anesthesia Plan  ASA: 2  Anesthesia Plan: General   Post-op Pain Management:    Induction: Intravenous  PONV Risk Score and Plan: 2 and Ondansetron, Dexamethasone and Treatment may vary due to age or medical condition  Airway Management Planned: Oral ETT  Additional Equipment:   Intra-op Plan:   Post-operative Plan: Extubation in OR  Informed Consent: I have reviewed the patients History and Physical, chart, labs and discussed the procedure including the risks, benefits and alternatives for the proposed anesthesia with the patient or authorized representative who has indicated his/her understanding and acceptance.     Dental advisory given  Plan Discussed with: CRNA, Anesthesiologist and Surgeon  Anesthesia Plan Comments: (PAT note written 05/31/2021 by Myra Gianotti, PA-C. )       Anesthesia Quick Evaluation

## 2021-06-01 ENCOUNTER — Encounter (HOSPITAL_COMMUNITY)
Admission: RE | Disposition: A | Discharge status: Home / Self Care | Payer: Self-pay | Source: Ambulatory Visit | Attending: Neurological Surgery

## 2021-06-01 ENCOUNTER — Other Ambulatory Visit: Payer: Self-pay | Admitting: Radiation Therapy

## 2021-06-01 ENCOUNTER — Inpatient Hospital Stay (HOSPITAL_COMMUNITY): Payer: Medicare Other | Admitting: Anesthesiology

## 2021-06-01 ENCOUNTER — Encounter (HOSPITAL_COMMUNITY): Payer: Self-pay | Admitting: Neurological Surgery

## 2021-06-01 ENCOUNTER — Ambulatory Visit: Payer: Medicare Other | Admitting: Hematology and Oncology

## 2021-06-01 ENCOUNTER — Inpatient Hospital Stay (HOSPITAL_COMMUNITY)
Admission: RE | Admit: 2021-06-01 | Discharge: 2021-06-03 | DRG: 027 | Disposition: A | Discharge status: Home / Self Care | Payer: Medicare Other | Source: Ambulatory Visit | Attending: Neurological Surgery | Admitting: Neurological Surgery

## 2021-06-01 ENCOUNTER — Ambulatory Visit: Payer: Self-pay | Admitting: Neurological Surgery

## 2021-06-01 DIAGNOSIS — C55 Malignant neoplasm of uterus, part unspecified: Secondary | ICD-10-CM | POA: Diagnosis present

## 2021-06-01 DIAGNOSIS — E119 Type 2 diabetes mellitus without complications: Secondary | ICD-10-CM | POA: Diagnosis present

## 2021-06-01 DIAGNOSIS — Z79899 Other long term (current) drug therapy: Secondary | ICD-10-CM | POA: Diagnosis not present

## 2021-06-01 DIAGNOSIS — E785 Hyperlipidemia, unspecified: Secondary | ICD-10-CM | POA: Diagnosis present

## 2021-06-01 DIAGNOSIS — D569 Thalassemia, unspecified: Secondary | ICD-10-CM | POA: Diagnosis present

## 2021-06-01 DIAGNOSIS — Z20822 Contact with and (suspected) exposure to covid-19: Secondary | ICD-10-CM | POA: Diagnosis present

## 2021-06-01 DIAGNOSIS — I10 Essential (primary) hypertension: Secondary | ICD-10-CM | POA: Diagnosis present

## 2021-06-01 DIAGNOSIS — Z8249 Family history of ischemic heart disease and other diseases of the circulatory system: Secondary | ICD-10-CM | POA: Diagnosis not present

## 2021-06-01 DIAGNOSIS — C799 Secondary malignant neoplasm of unspecified site: Secondary | ICD-10-CM | POA: Diagnosis present

## 2021-06-01 DIAGNOSIS — F419 Anxiety disorder, unspecified: Secondary | ICD-10-CM | POA: Diagnosis present

## 2021-06-01 DIAGNOSIS — D496 Neoplasm of unspecified behavior of brain: Secondary | ICD-10-CM | POA: Diagnosis present

## 2021-06-01 DIAGNOSIS — C7931 Secondary malignant neoplasm of brain: Principal | ICD-10-CM | POA: Diagnosis present

## 2021-06-01 HISTORY — PX: CRANIOTOMY: SHX93

## 2021-06-01 LAB — POCT I-STAT 7, (LYTES, BLD GAS, ICA,H+H)
Acid-Base Excess: 0 mmol/L (ref 0.0–2.0)
Bicarbonate: 25.5 mmol/L (ref 20.0–28.0)
Calcium, Ion: 1.31 mmol/L (ref 1.15–1.40)
HCT: 27 % — ABNORMAL LOW (ref 36.0–46.0)
Hemoglobin: 9.2 g/dL — ABNORMAL LOW (ref 12.0–15.0)
O2 Saturation: 100 %
Patient temperature: 34.8
Potassium: 4.3 mmol/L (ref 3.5–5.1)
Sodium: 135 mmol/L (ref 135–145)
TCO2: 27 mmol/L (ref 22–32)
pCO2 arterial: 42.4 mmHg (ref 32.0–48.0)
pH, Arterial: 7.377 (ref 7.350–7.450)
pO2, Arterial: 460 mmHg — ABNORMAL HIGH (ref 83.0–108.0)

## 2021-06-01 LAB — GLUCOSE, CAPILLARY
Glucose-Capillary: 114 mg/dL — ABNORMAL HIGH (ref 70–99)
Glucose-Capillary: 129 mg/dL — ABNORMAL HIGH (ref 70–99)

## 2021-06-01 LAB — MRSA NEXT GEN BY PCR, NASAL: MRSA by PCR Next Gen: NOT DETECTED

## 2021-06-01 SURGERY — CRANIOTOMY TUMOR EXCISION
Anesthesia: General | Laterality: Right

## 2021-06-01 MED ORDER — BACITRACIN ZINC 500 UNIT/GM EX OINT
TOPICAL_OINTMENT | CUTANEOUS | Status: AC
  Filled 2021-06-01: qty 28.35

## 2021-06-01 MED ORDER — THROMBIN 5000 UNITS EX SOLR: OROMUCOSAL | Status: DC | PRN

## 2021-06-01 MED ORDER — OXYCODONE HCL 5 MG PO TABS: 5.0000 mg | ORAL_TABLET | Freq: Once | ORAL | Status: DC | PRN

## 2021-06-01 MED ORDER — AMLODIPINE BESYLATE 10 MG PO TABS
10.0000 mg | ORAL_TABLET | Freq: Every day | ORAL | Status: DC
  Administered 2021-06-02 – 2021-06-03 (×2): 10 mg via ORAL
  Filled 2021-06-01 (×2): qty 1

## 2021-06-01 MED ORDER — PROCHLORPERAZINE MALEATE 10 MG PO TABS
10.0000 mg | ORAL_TABLET | Freq: Four times a day (QID) | ORAL | Status: DC | PRN
  Filled 2021-06-01: qty 1

## 2021-06-01 MED ORDER — DEXAMETHASONE SODIUM PHOSPHATE 10 MG/ML IJ SOLN
INTRAMUSCULAR | Status: DC | PRN
Start: 2021-06-01 — End: 2021-06-01
  Administered 2021-06-01: 10 mg via INTRAVENOUS

## 2021-06-01 MED ORDER — EPHEDRINE SULFATE-NACL 50-0.9 MG/10ML-% IV SOSY
PREFILLED_SYRINGE | INTRAVENOUS | Status: DC | PRN
  Administered 2021-06-01: 5 mg via INTRAVENOUS

## 2021-06-01 MED ORDER — DOXAZOSIN MESYLATE 4 MG PO TABS
4.0000 mg | ORAL_TABLET | Freq: Every day | ORAL | Status: DC
  Administered 2021-06-02 – 2021-06-03 (×2): 4 mg via ORAL
  Filled 2021-06-01 (×2): qty 1

## 2021-06-01 MED ORDER — SENNOSIDES-DOCUSATE SODIUM 8.6-50 MG PO TABS
1.0000 | ORAL_TABLET | Freq: Two times a day (BID) | ORAL | Status: DC
  Administered 2021-06-01 – 2021-06-03 (×4): 1 via ORAL
  Filled 2021-06-01 (×4): qty 1

## 2021-06-01 MED ORDER — THROMBIN 5000 UNITS EX SOLR
CUTANEOUS | Status: AC
  Filled 2021-06-01: qty 5000

## 2021-06-01 MED ORDER — DEXAMETHASONE 4 MG PO TABS
4.0000 mg | ORAL_TABLET | Freq: Two times a day (BID) | ORAL | Status: DC
  Administered 2021-06-02 – 2021-06-03 (×3): 4 mg via ORAL
  Filled 2021-06-01 (×3): qty 1

## 2021-06-01 MED ORDER — 0.9 % SODIUM CHLORIDE (POUR BTL) OPTIME
TOPICAL | Status: DC | PRN
  Administered 2021-06-01: 3000 mL

## 2021-06-01 MED ORDER — LABETALOL HCL 5 MG/ML IV SOLN: 10.0000 mg | INTRAVENOUS | Status: DC | PRN

## 2021-06-01 MED ORDER — FLEET ENEMA 7-19 GM/118ML RE ENEM: 1.0000 | ENEMA | Freq: Once | RECTAL | Status: DC | PRN

## 2021-06-01 MED ORDER — ONDANSETRON HCL 4 MG PO TABS: 8.0000 mg | ORAL_TABLET | Freq: Three times a day (TID) | ORAL | Status: DC | PRN

## 2021-06-01 MED ORDER — LACTATED RINGERS IV SOLN: INTRAVENOUS | Status: DC

## 2021-06-01 MED ORDER — THROMBIN 20000 UNITS EX SOLR
CUTANEOUS | Status: AC
  Filled 2021-06-01: qty 20000

## 2021-06-01 MED ORDER — PANTOPRAZOLE SODIUM 40 MG IV SOLR
40.0000 mg | Freq: Every day | INTRAVENOUS | Status: DC
  Administered 2021-06-01: 40 mg via INTRAVENOUS
  Filled 2021-06-01: qty 40

## 2021-06-01 MED ORDER — CHLORHEXIDINE GLUCONATE 0.12 % MT SOLN
OROMUCOSAL | Status: AC
  Administered 2021-06-01: 15 mL via OROMUCOSAL
  Filled 2021-06-01: qty 15

## 2021-06-01 MED ORDER — CEFAZOLIN SODIUM-DEXTROSE 2-4 GM/100ML-% IV SOLN
INTRAVENOUS | Status: AC
  Filled 2021-06-01: qty 100

## 2021-06-01 MED ORDER — ONDANSETRON HCL 4 MG/2ML IJ SOLN: 4.0000 mg | INTRAMUSCULAR | Status: DC | PRN

## 2021-06-01 MED ORDER — ROCURONIUM BROMIDE 10 MG/ML (PF) SYRINGE
PREFILLED_SYRINGE | INTRAVENOUS | Status: AC
  Filled 2021-06-01: qty 10

## 2021-06-01 MED ORDER — LIDOCAINE 2% (20 MG/ML) 5 ML SYRINGE
INTRAMUSCULAR | Status: AC
  Filled 2021-06-01: qty 5

## 2021-06-01 MED ORDER — PROPOFOL 10 MG/ML IV BOLUS
INTRAVENOUS | Status: DC | PRN
  Administered 2021-06-01: 30 mg via INTRAVENOUS
  Administered 2021-06-01: 100 mg via INTRAVENOUS

## 2021-06-01 MED ORDER — SODIUM CHLORIDE 0.9 % IV SOLN
0.0500 ug/kg/min | INTRAVENOUS | Status: AC
  Administered 2021-06-01: .2 ug/kg/min via INTRAVENOUS
  Filled 2021-06-01: qty 5000

## 2021-06-01 MED ORDER — SENNA 8.6 MG PO TABS: 1.0000 | ORAL_TABLET | Freq: Two times a day (BID) | ORAL | Status: DC

## 2021-06-01 MED ORDER — OXYBUTYNIN CHLORIDE 5 MG PO TABS
5.0000 mg | ORAL_TABLET | Freq: Three times a day (TID) | ORAL | Status: DC | PRN
  Filled 2021-06-01: qty 1

## 2021-06-01 MED ORDER — POLYETHYLENE GLYCOL 3350 17 G PO PACK: 17.0000 g | PACK | Freq: Two times a day (BID) | ORAL | Status: DC | PRN

## 2021-06-01 MED ORDER — CHLORHEXIDINE GLUCONATE 0.12 % MT SOLN: 15.0000 mL | Freq: Once | OROMUCOSAL | Status: AC

## 2021-06-01 MED ORDER — LIDOCAINE 2% (20 MG/ML) 5 ML SYRINGE
INTRAMUSCULAR | Status: DC | PRN
  Administered 2021-06-01: 60 mg via INTRAVENOUS

## 2021-06-01 MED ORDER — MAGNESIUM OXIDE -MG SUPPLEMENT 400 (240 MG) MG PO TABS
400.0000 mg | ORAL_TABLET | Freq: Two times a day (BID) | ORAL | Status: DC
  Administered 2021-06-01 – 2021-06-03 (×4): 400 mg via ORAL
  Filled 2021-06-01 (×4): qty 1

## 2021-06-01 MED ORDER — FENTANYL CITRATE (PF) 250 MCG/5ML IJ SOLN
INTRAMUSCULAR | Status: AC
  Filled 2021-06-01: qty 5

## 2021-06-01 MED ORDER — PHENYLEPHRINE HCL-NACL 20-0.9 MG/250ML-% IV SOLN
INTRAVENOUS | Status: DC | PRN
  Administered 2021-06-01: 20 ug/min via INTRAVENOUS

## 2021-06-01 MED ORDER — FENTANYL CITRATE (PF) 250 MCG/5ML IJ SOLN
INTRAMUSCULAR | Status: DC | PRN
  Administered 2021-06-01: 100 ug via INTRAVENOUS

## 2021-06-01 MED ORDER — FENTANYL CITRATE (PF) 100 MCG/2ML IJ SOLN
INTRAMUSCULAR | Status: AC
  Filled 2021-06-01: qty 2

## 2021-06-01 MED ORDER — BUPIVACAINE HCL 0.25 % IJ SOLN
INTRAMUSCULAR | Status: DC | PRN
  Administered 2021-06-01: 10 mL

## 2021-06-01 MED ORDER — SODIUM CHLORIDE 0.9 % IV SOLN: INTRAVENOUS | Status: DC

## 2021-06-01 MED ORDER — CHLORHEXIDINE GLUCONATE CLOTH 2 % EX PADS
6.0000 | MEDICATED_PAD | Freq: Every day | CUTANEOUS | Status: DC
  Administered 2021-06-02 – 2021-06-03 (×3): 6 via TOPICAL

## 2021-06-01 MED ORDER — LIDOCAINE-PRILOCAINE 2.5-2.5 % EX CREA
1.0000 "application " | TOPICAL_CREAM | Freq: Every day | CUTANEOUS | Status: DC | PRN
  Filled 2021-06-01: qty 5

## 2021-06-01 MED ORDER — OXYCODONE HCL 5 MG PO TABS
5.0000 mg | ORAL_TABLET | ORAL | Status: DC | PRN
  Administered 2021-06-01 – 2021-06-02 (×2): 5 mg via ORAL
  Filled 2021-06-01 (×2): qty 1

## 2021-06-01 MED ORDER — DOCUSATE SODIUM 100 MG PO CAPS
100.0000 mg | ORAL_CAPSULE | Freq: Two times a day (BID) | ORAL | Status: DC
  Administered 2021-06-01 – 2021-06-03 (×4): 100 mg via ORAL
  Filled 2021-06-01 (×4): qty 1

## 2021-06-01 MED ORDER — FENTANYL CITRATE (PF) 100 MCG/2ML IJ SOLN
25.0000 ug | INTRAMUSCULAR | Status: DC | PRN
  Administered 2021-06-01 (×4): 25 ug via INTRAVENOUS

## 2021-06-01 MED ORDER — OXYCODONE HCL 5 MG/5ML PO SOLN
5.0000 mg | Freq: Once | ORAL | Status: DC | PRN
Start: 2021-06-01 — End: 2021-06-01

## 2021-06-01 MED ORDER — PHENYLEPHRINE 40 MCG/ML (10ML) SYRINGE FOR IV PUSH (FOR BLOOD PRESSURE SUPPORT)
PREFILLED_SYRINGE | INTRAVENOUS | Status: DC | PRN
  Administered 2021-06-01 (×2): 80 ug via INTRAVENOUS

## 2021-06-01 MED ORDER — DEXAMETHASONE SODIUM PHOSPHATE 10 MG/ML IJ SOLN
INTRAMUSCULAR | Status: AC
  Filled 2021-06-01: qty 1

## 2021-06-01 MED ORDER — LORAZEPAM 1 MG PO TABS
0.5000 mg | ORAL_TABLET | Freq: Two times a day (BID) | ORAL | Status: DC | PRN
  Administered 2021-06-01 – 2021-06-02 (×2): 0.5 mg via ORAL
  Filled 2021-06-01 (×2): qty 1

## 2021-06-01 MED ORDER — BISACODYL 10 MG RE SUPP: 10.0000 mg | Freq: Every day | RECTAL | Status: DC | PRN

## 2021-06-01 MED ORDER — ATENOLOL 50 MG PO TABS
50.0000 mg | ORAL_TABLET | Freq: Every day | ORAL | Status: DC
Start: 2021-06-02 — End: 2021-06-03
  Administered 2021-06-02 – 2021-06-03 (×2): 50 mg via ORAL
  Filled 2021-06-01 (×2): qty 1

## 2021-06-01 MED ORDER — ONDANSETRON HCL 4 MG PO TABS: 4.0000 mg | ORAL_TABLET | ORAL | Status: DC | PRN

## 2021-06-01 MED ORDER — CHLORHEXIDINE GLUCONATE CLOTH 2 % EX PADS: 6.0000 | MEDICATED_PAD | Freq: Once | CUTANEOUS | Status: DC

## 2021-06-01 MED ORDER — ONDANSETRON HCL 4 MG/2ML IJ SOLN
INTRAMUSCULAR | Status: AC
  Filled 2021-06-01: qty 2

## 2021-06-01 MED ORDER — ONDANSETRON HCL 4 MG/2ML IJ SOLN: 4.0000 mg | Freq: Four times a day (QID) | INTRAMUSCULAR | Status: DC | PRN

## 2021-06-01 MED ORDER — POLYETHYLENE GLYCOL 3350 17 G PO PACK: 17.0000 g | PACK | Freq: Every day | ORAL | Status: DC | PRN

## 2021-06-01 MED ORDER — ROCURONIUM BROMIDE 10 MG/ML (PF) SYRINGE
PREFILLED_SYRINGE | INTRAVENOUS | Status: DC | PRN
  Administered 2021-06-01: 30 mg via INTRAVENOUS
  Administered 2021-06-01: 60 mg via INTRAVENOUS
  Administered 2021-06-01: 40 mg via INTRAVENOUS

## 2021-06-01 MED ORDER — CEFAZOLIN SODIUM-DEXTROSE 2-4 GM/100ML-% IV SOLN
2.0000 g | INTRAVENOUS | Status: AC
  Administered 2021-06-01: 2 g via INTRAVENOUS

## 2021-06-01 MED ORDER — PROMETHAZINE HCL 25 MG PO TABS: 12.5000 mg | ORAL_TABLET | ORAL | Status: DC | PRN

## 2021-06-01 MED ORDER — BUPIVACAINE HCL (PF) 0.25 % IJ SOLN
INTRAMUSCULAR | Status: AC
  Filled 2021-06-01: qty 30

## 2021-06-01 MED ORDER — ORAL CARE MOUTH RINSE: 15.0000 mL | Freq: Once | OROMUCOSAL | Status: AC

## 2021-06-01 MED ORDER — SODIUM CHLORIDE 0.9 % IV SOLN: INTRAVENOUS | Status: DC | PRN

## 2021-06-01 MED ORDER — THROMBIN 20000 UNITS EX SOLR: CUTANEOUS | Status: DC | PRN

## 2021-06-01 MED ORDER — SUGAMMADEX SODIUM 200 MG/2ML IV SOLN
INTRAVENOUS | Status: DC | PRN
  Administered 2021-06-01: 200 mg via INTRAVENOUS

## 2021-06-01 SURGICAL SUPPLY — 42 items
BAG COUNTER SPONGE SURGICOUNT (BAG) ×3 IMPLANT
BAG SPNG CNTER NS LX DISP (BAG) ×1
BLADE CLIPPER SURG (BLADE) ×7 IMPLANT
BNDG GAUZE ELAST 4 BULKY (GAUZE/BANDAGES/DRESSINGS) ×2 IMPLANT
BUR ACORN 6.0 PRECISION (BURR) ×3 IMPLANT
BUR SPIRAL ROUTER 2.3 (BUR) ×3 IMPLANT
CANISTER SUCT 3000ML PPV (MISCELLANEOUS) ×4 IMPLANT
CNTNR URN SCR LID CUP LEK RST (MISCELLANEOUS) ×2 IMPLANT
CONT SPEC 4OZ STRL OR WHT (MISCELLANEOUS) ×2
DRAPE MICROSCOPE LEICA (MISCELLANEOUS) ×1 IMPLANT
DRAPE WARM FLUID 44X44 (DRAPES) ×3 IMPLANT
DURAPREP 6ML APPLICATOR 50/CS (WOUND CARE) ×3 IMPLANT
ELECT REM PT RETURN 9FT ADLT (ELECTROSURGICAL) ×2
ELECTRODE REM PT RTRN 9FT ADLT (ELECTROSURGICAL) ×2 IMPLANT
GAUZE 4X4 16PLY ~~LOC~~+RFID DBL (SPONGE) ×2 IMPLANT
GAUZE SPONGE 4X4 12PLY STRL LF (GAUZE/BANDAGES/DRESSINGS) ×1 IMPLANT
GLOVE SURG LTX SZ8.5 (GLOVE) ×4 IMPLANT
GLOVE SURG UNDER POLY LF SZ8.5 (GLOVE) ×4 IMPLANT
GOWN STRL REUS W/ TWL LRG LVL3 (GOWN DISPOSABLE) IMPLANT
GOWN STRL REUS W/ TWL XL LVL3 (GOWN DISPOSABLE) ×2 IMPLANT
GOWN STRL REUS W/TWL 2XL LVL3 (GOWN DISPOSABLE) ×4 IMPLANT
GOWN STRL REUS W/TWL LRG LVL3 (GOWN DISPOSABLE) ×4
GOWN STRL REUS W/TWL XL LVL3 (GOWN DISPOSABLE) ×2
GRAFT DURAGEN MATRIX 1WX1L (Tissue) ×1 IMPLANT
HEMOSTAT SURGICEL 2X14 (HEMOSTASIS) IMPLANT
KIT BASIN OR (CUSTOM PROCEDURE TRAY) ×3 IMPLANT
KIT TURNOVER KIT B (KITS) ×3 IMPLANT
NEEDLE HYPO 22GX1.5 SAFETY (NEEDLE) ×3 IMPLANT
NS IRRIG 1000ML POUR BTL (IV SOLUTION) ×5 IMPLANT
PACK CRANIOTOMY CUSTOM (CUSTOM PROCEDURE TRAY) ×3 IMPLANT
PATTIES SURGICAL 1/4 X 3 (GAUZE/BANDAGES/DRESSINGS) ×1 IMPLANT
PIN MAYFIELD SKULL DISP (PIN) ×1 IMPLANT
SPONGE SURGIFOAM ABS GEL 100 (HEMOSTASIS) ×1 IMPLANT
STAPLER SKIN PROX WIDE 3.9 (STAPLE) ×3 IMPLANT
SUT NURALON 4 0 TR CR/8 (SUTURE) ×5 IMPLANT
SUT VIC AB 2-0 CP2 18 (SUTURE) ×6 IMPLANT
SUT VIC AB 4-0 RB1 18 (SUTURE) ×3 IMPLANT
TAPE CLOTH SURG 4X10 WHT LF (GAUZE/BANDAGES/DRESSINGS) ×1 IMPLANT
TOWEL GREEN STERILE (TOWEL DISPOSABLE) ×3 IMPLANT
TOWEL GREEN STERILE FF (TOWEL DISPOSABLE) ×3 IMPLANT
TRAY FOLEY MTR SLVR 16FR STAT (SET/KITS/TRAYS/PACK) IMPLANT
WATER STERILE IRR 1000ML POUR (IV SOLUTION) ×3 IMPLANT

## 2021-06-01 NOTE — Progress Notes (Deleted)
Name: Teresa Oliver             MRN: [id number]      Date: 05/31/21                             DOB: [birth year]   Stereotactic Radiosurgery Operative Note   PRE-OPERATIVE DIAGNOSIS:  Multiple Brain Metastases   POST-OPERATIVE DIAGNOSIS:  Multiple Brain Metastases   PROCEDURE:  Stereotactic Radiosurgery - ( Pre Op to the Rt Cerebellar 27mm Target)   SURGEON:  Kristeen Miss, MD   NARRATIVE: The patient underwent a radiation treatment planning session in the radiation oncology simulation suite under the care of the radiation oncology physician and physicist.  I participated closely in the radiation treatment planning afterwards. The patient underwent planning CT which was fused to 3T high resolution MRI with 1 mm axial slices.  These images were fused on the planning system.  We contoured the gross target volumes and subsequently expanded this to yield the Planning Target Volume. I actively participated in the planning process.  I helped to define and review the target contours and also the contours of the optic pathway, eyes, brainstem and selected nearby organs at risk.  All the dose constraints for critical structures were reviewed and compared to AAPM Task Group 101.  The prescription dose conformity was reviewed.  I approved the plan electronically.     Accordingly, [name] was brought to the TrueBeam stereotactic radiation treatment linac and placed in the custom immobilization mask.  The patient was aligned according to the IR fiducial markers with BrainLab Exactrac, then orthogonal x-rays were used in ExacTrac with the 6DOF robotic table and the shifts were made to align the patient   [name]  received stereotactic radiosurgery uneventfully.     Lesions treated:  5    Complex lesions treated:  1 (>3.5 cm, <23mm of optic path, or within the brainstem)    The detailed description of the procedure is recorded in the radiation oncology procedure note.  I was present for the duration  of the procedure.     DISPOSITION:  Following delivery, the patient was transported to nursing in stable condition and monitored for possible acute effects to be discharged to home in stable condition with follow-up in one month.   Kristeen Miss, MD 05/31/2021 4:15 PM

## 2021-06-01 NOTE — Anesthesia Procedure Notes (Signed)
Arterial Line Insertion Start/End1/13/2023 12:00 PM, 06/01/2021 12:30 PM Performed by: Dorann Lodge, CRNA, CRNA  Patient location: Pre-op. Preanesthetic checklist: patient identified, IV checked, site marked, risks and benefits discussed, surgical consent, monitors and equipment checked, pre-op evaluation, timeout performed and anesthesia consent Lidocaine 1% used for infiltration Left, radial was placed Catheter size: 20 G Hand hygiene performed  and maximum sterile barriers used   Attempts: 2 Procedure performed without using ultrasound guided technique. Following insertion, dressing applied and Biopatch. Post procedure assessment: normal and unchanged  Patient tolerated the procedure well with no immediate complications.

## 2021-06-01 NOTE — Anesthesia Postprocedure Evaluation (Signed)
Anesthesia Post Note  Patient: [name]  Procedure(s) Performed: Right suboccipital craniectomy for tumor resection (Right)     Patient location during evaluation: PACU Anesthesia Type: General Level of consciousness: sedated and patient cooperative Pain management: pain level controlled Vital Signs Assessment: post-procedure vital signs reviewed and stable Respiratory status: spontaneous breathing Cardiovascular status: stable Anesthetic complications: no   No notable events documented.  Last Vitals:  Vitals:   06/01/21 1900 06/01/21 1915  BP: 132/71 139/70  Pulse: (!) 49 (!) 56  Resp: 10 14  Temp:  (!) 36.4 C  SpO2: 100% 100%    Last Pain:  Vitals:   06/01/21 1830  TempSrc:   PainSc: [place]

## 2021-06-01 NOTE — Progress Notes (Signed)
Name: Teresa Oliver             MRN: [id number]      Date: 05/31/21                             DOB: 25-Jan-1946   Stereotactic Radiosurgery Operative Note   PRE-OPERATIVE DIAGNOSIS:  Multiple Brain Metastases   POST-OPERATIVE DIAGNOSIS:  Multiple Brain Metastases   PROCEDURE:  Stereotactic Radiosurgery - ( Pre Op to the Rt Cerebellar 48mm Target)   SURGEON:  Kristeen Miss, MD   NARRATIVE: The patient underwent a radiation treatment planning session in the radiation oncology simulation suite under the care of the radiation oncology physician and physicist.  I participated closely in the radiation treatment planning afterwards. The patient underwent planning CT which was fused to 3T high resolution MRI with 1 mm axial slices.  These images were fused on the planning system.  We contoured the gross target volumes and subsequently expanded this to yield the Planning Target Volume. I actively participated in the planning process.  I helped to define and review the target contours and also the contours of the optic pathway, eyes, brainstem and selected nearby organs at risk.  All the dose constraints for critical structures were reviewed and compared to AAPM Task Group 101.  The prescription dose conformity was reviewed.  I approved the plan electronically.     Accordingly, Teresa Oliver was brought to the TrueBeam stereotactic radiation treatment linac and placed in the custom immobilization mask.  The patient was aligned according to the IR fiducial markers with BrainLab Exactrac, then orthogonal x-rays were used in ExacTrac with the 6DOF robotic table and the shifts were made to align the patient   Teresa Oliver  received stereotactic radiosurgery uneventfully.     Lesions treated:  5    Complex lesions treated:  1 (>3.5 cm, <32mm of optic path, or within the brainstem)    The detailed description of the procedure is recorded in the radiation oncology procedure note.  I was present for the duration  of the procedure.     DISPOSITION:  Following delivery, the patient was transported to nursing in stable condition and monitored for possible acute effects to be discharged to home in stable condition with follow-up in one month.   Kristeen Miss, MD 05/31/2021 4:15 PM

## 2021-06-01 NOTE — Progress Notes (Incomplete Revision)
Name: Teresa Oliver             MRN: [id number]      Date: 05/31/21                             DOB: Jan 13, 1946   Stereotactic Radiosurgery Operative Note   PRE-OPERATIVE DIAGNOSIS:  Multiple Brain Metastases   POST-OPERATIVE DIAGNOSIS:  Multiple Brain Metastases   PROCEDURE:  Stereotactic Radiosurgery - ( Pre Op to the Rt Cerebellar 46mm Target)   SURGEON:  Kristeen Miss, MD   NARRATIVE: The patient underwent a radiation treatment planning session in the radiation oncology simulation suite under the care of the radiation oncology physician and physicist.  I participated closely in the radiation treatment planning afterwards. The patient underwent planning CT which was fused to 3T high resolution MRI with 1 mm axial slices.  These images were fused on the planning system.  We contoured the gross target volumes and subsequently expanded this to yield the Planning Target Volume. I actively participated in the planning process.  I helped to define and review the target contours and also the contours of the optic pathway, eyes, brainstem and selected nearby organs at risk.  All the dose constraints for critical structures were reviewed and compared to AAPM Task Group 101.  The prescription dose conformity was reviewed.  I approved the plan electronically.     Accordingly, Teresa Oliver was brought to the TrueBeam stereotactic radiation treatment linac and placed in the custom immobilization mask.  The patient was aligned according to the IR fiducial markers with BrainLab Exactrac, then orthogonal x-rays were used in ExacTrac with the 6DOF robotic table and the shifts were made to align the patient   Teresa Oliver  received stereotactic radiosurgery uneventfully.     Lesions treated:  5    Complex lesions treated:  1 (>3.5 cm, <23mm of optic path, or within the brainstem)    The detailed description of the procedure is recorded in the radiation oncology procedure note.  I was present for the duration  of the procedure.     DISPOSITION:  Following delivery, the patient was transported to nursing in stable condition and monitored for possible acute effects to be discharged to home in stable condition with follow-up in one month.   Kristeen Miss, MD 05/31/2021 4:15 PM

## 2021-06-01 NOTE — Op Note (Signed)
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  Operating microscope.,  Microdissection technique. Surgeon: Kristeen Miss First Assistant: Consuella Lose MD Anesthesia: General endotracheal Indications: Offender Teresa Oliver is a 76 year old individual who was having some balance issues and was found to have multiple metastatic lesions to the brain thought to be from her primary which was uterine cancer.  She underwent stereotactic radiosurgery yesterday and is now taken to the operating room to undergo resection of the largest of these lesions which is in the right cerebellar hemisphere.  It is causing substantial mass-effect.  It measures 3.7 cm in maximum diameter.  Procedure: Patient was brought to the operating room supine on the stretcher.  After the smooth induction of general endotracheal anesthesia she was placed in a 3 pin headrest and then turned to the right lateral decubitus position and had her head secured such that she was in a park bench position with her head facing downward.  The back of the neck in the suboccipital region was shaved prepped with alcohol and DuraPrep and draped in a sterile fashion.  A standard retromastoid incision was created based on the mastoid prominence.  This was taken down to the suboccipital region and the muscle was carefully dissected from the suboccipital region on either side.  Then a craniectomy was performed near the thin port of the suboccipital bone and carried out to the mastoid air cells and the superiorly to the region of the sinus when some bleeding from the sinus was encountered this was packed off with bone wax as were the mastoid air cells and the dissection was then carried out into the corner of the junction of the transverse and sigmoid sinuses.   Once an adequate craniectomy was performed measuring approximately 3-1/2 cm in maximum diameter open the dura in a Y-shaped fashion tenting a leaf of the dura superiorly and relief laterally.  The cerebellar hemisphere that was encountered was at the lateral aspect by palpating we noted that we are fairly superior in the craniectomy.  Because of the nature of the tumor was felt that lateral cerebellum resection would be required and we perform this until in the depth I saw the edge of the tumor.  Using the operating microscope we then dissected further parts of the cerebellar hemisphere to expose more the tumor.  Then by mobilizing the tumor we were able to obtain biopsies from within it and resection was performed in a piecemeal fashion using bipolar cautery and some pituitary rongeurs to remove pieces of the tumor with dissection of the arachnoid plane.  In the end the entire tumor was removed in this fashion.  Hemostasis in the walls of the cavity was then obtained with some bipolar cautery and pledgets of Gelfoam soaked in thrombin which were later irrigated away several cotton balls were soaked in allowed to expand to provide some additional hemostasis once the hemostasis was noted to be good we then proceeded with closure the cerebellar hemisphere at this point was quite relaxed having drained a considerable amount of CSF from the region of the ambient cistern.  The dura was closed over a piece of DuraGen using interrupted 4-0 Nurolon sutures.  Gelfoam was used on the outside and the bone wax packing of the sinus and the mastoid air cells was double checked the retractor was released and then the fascia was closed  with 2-0 Vicryl in interrupted fashion 2-0 Vicryl was used in the subcutaneous tissues and 4-0 Vicryl subcuticularly surgical staples were used on the scalp incision itself patient was then released from the three-point headrest and turned back to the supine position for extubation and further  postanesthesia care.  Blood loss was estimated 500 cc.

## 2021-06-01 NOTE — Transfer of Care (Signed)
Immediate Anesthesia Transfer of Care Note  Patient: Teresa Oliver  Procedure(s) Performed: Right suboccipital craniectomy for tumor resection (Right)  Patient Location: PACU  Anesthesia Type:General  Level of Consciousness: drowsy and patient cooperative  Airway & Oxygen Therapy: Patient Spontanous Breathing  Post-op Assessment: Report given to RN and Post -op Vital signs reviewed and stable  Post vital signs: Reviewed and stable  Last Vitals:  Vitals Value Taken Time  BP 140/82 06/01/21 1815  Temp    Pulse 62 06/01/21 1824  Resp 12 06/01/21 1824  SpO2 100 % 06/01/21 1824  Vitals shown include unvalidated device data.  Last Pain:  Vitals:   06/01/21 1815  TempSrc:   PainSc: 0-No pain         Complications: No notable events documented.

## 2021-06-01 NOTE — Anesthesia Procedure Notes (Signed)
Procedure Name: Intubation Date/Time: 06/01/2021 2:41 PM Performed by: Dorann Lodge, CRNA Pre-anesthesia Checklist: Patient identified, Emergency Drugs available, Suction available and Patient being monitored Patient Re-evaluated:Patient Re-evaluated prior to induction Oxygen Delivery Method: Circle System Utilized Preoxygenation: Pre-oxygenation with 100% oxygen Induction Type: IV induction Ventilation: Mask ventilation without difficulty and Oral airway inserted - appropriate to patient size Laryngoscope Size: Mac and 3 Grade View: Grade II Tube type: Oral Tube size: 7.0 mm Number of attempts: 1 Airway Equipment and Method: Stylet and Oral airway Placement Confirmation: ETT inserted through vocal cords under direct vision, positive ETCO2 and breath sounds checked- equal and bilateral Secured at: 22 cm Tube secured with: Tape Dental Injury: Teeth and Oropharynx as per pre-operative assessment

## 2021-06-01 NOTE — H&P (Signed)
Teresa Oliver is an 76 y.o. female.   Chief Complaint: Multiple metastatic brain mets from uterine cancer HPI: Teresa Oliver is a 76 year old individual whose had a remote history of uterine cancer.  She was diagnosed with multiple metastatic disease here in the last month.  After careful consideration of her options we advised stereotactic radiosurgery to the brain mets followed by surgical resection of the singular largest met in the right cerebellar hemisphere.  She is now admitted for that procedure.  Past Medical History:  Diagnosis Date   Anemia    chronic   Anxiety    Depression    Diabetes mellitus without complication ([facility])    Type 2 listed in chart 08/2020   Hyperlipemia    Hypertension    Irritable bowel syndrome (IBS)    Pneumonia    Thalassemia    Uterine cancer [facility])     Past Surgical History:  Procedure Laterality Date   BIOPSY  08/31/2020   Procedure: BIOPSY;  Surgeon: Wilford Corner, MD;  Location: [facility];  Service: Endoscopy;;   BREAST EXCISIONAL BIOPSY Left    1985 benign   COLONOSCOPY N/A 08/31/2020   Procedure: COLONOSCOPY;  Surgeon: Wilford Corner, MD;  Location: [facility];  Service: Endoscopy;  Laterality: N/A;   HERNIA REPAIR     IR IMAGING GUIDED PORT INSERTION  09/01/2020   SUBMUCOSAL TATTOO INJECTION  08/31/2020   Procedure: SUBMUCOSAL TATTOO INJECTION;  Surgeon: Wilford Corner, MD;  Location: [facility];  Service: Endoscopy;;    Family History  Problem Relation Age of Onset   Stomach cancer Mother    Dementia Mother    Thalassemia Mother    Heart attack Father    Prostate cancer Brother    Thalassemia Daughter    Social History:  reports that she has never smoked. She has never used smokeless tobacco. She reports that she does not currently use alcohol. She reports that she does not currently use drugs.  Allergies: No Known Allergies  Medications Prior to Admission  Medication Sig Dispense Refill   amLODipine (NORVASC) 10  MG tablet Take 1 tablet (10 mg total) by mouth daily. 90 tablet 2   atenolol (TENORMIN) 50 MG tablet TAKE 1 TABLET(50 MG) BY MOUTH DAILY (Patient taking differently: Take 50 mg by mouth daily.) 30 tablet 2   cholecalciferol (VITAMIN D3) 25 MCG (1000 UNIT) tablet Take 1,000 Units by mouth daily.     dexamethasone (DECADRON) 4 MG tablet Take 1 tablet (4 mg total) by mouth 2 (two) times daily with a meal. 40 tablet 0   doxazosin (CARDURA) 4 MG tablet Take 1 tablet (4 mg total) by mouth daily. 30 tablet 3   LORazepam (ATIVAN) 0.5 MG tablet TAKE 1 TABLET(0.5 MG) BY MOUTH TWICE DAILY AS NEEDED FOR ANXIETY (Patient taking differently: Take 0.5 mg by mouth 2 (two) times daily as needed for anxiety.) 60 tablet 0   MAGNESIUM-OXIDE 400 (240 Mg) MG tablet TAKE 1 TABLET(400 MG) BY MOUTH TWICE DAILY (Patient taking differently: Take 400 mg by mouth 2 (two) times daily.) 60 tablet 1   ondansetron (ZOFRAN) 8 MG tablet Take 1 tablet (8 mg total) by mouth every 8 (eight) hours as needed for nausea. 30 tablet 3   prochlorperazine (COMPAZINE) 10 MG tablet Take 1 tablet (10 mg total) by mouth every 6 (six) hours as needed for nausea or vomiting. 30 tablet 1   senna-docusate (SENOKOT-S) 8.6-50 MG tablet Take 1 tablet by mouth 2 (two) times daily. (Patient  taking differently: Take 1 tablet by mouth at bedtime as needed for mild constipation.) 30 tablet 1   lidocaine-prilocaine (EMLA) cream Apply 1 application topically daily as needed. 30 g 3   oxybutynin (DITROPAN) 5 MG tablet Take 1 tablet (5 mg total) by mouth every 8 (eight) hours as needed for bladder spasms. 90 tablet 1   oxyCODONE (OXY IR/ROXICODONE) 5 MG immediate release tablet Take 1 tablet (5 mg total) by mouth every 4 (four) hours as needed for severe pain. 30 tablet 0   polyethylene glycol (MIRALAX / GLYCOLAX) 17 g packet Take 17 g by mouth 2 (two) times daily as needed for moderate constipation. 14 each 0    Results for orders placed or performed during the  hospital encounter of 06/01/21 (from the past 48 hour(s))  Glucose, capillary     Status: Abnormal   Collection Time: 06/01/21 11:02 AM  Result Value Ref Range   Glucose-Capillary 114 (H) 70 - 99 mg/dL    Comment: Glucose reference range applies only to samples taken after fasting for at least 8 hours.   No results found.  Review of Systems  All other systems reviewed and are negative.  Blood pressure (!) 181/82, pulse 78, temperature 98.6 F (37 C), temperature source Oral, resp. rate 18, height 5' (1.524 m), weight 49.9 kg, SpO2 98 %. Physical Exam Constitutional:      Appearance: Normal appearance. She is normal weight.  HENT:     Head: Normocephalic and atraumatic.     Right Ear: Tympanic membrane normal.     Left Ear: Tympanic membrane normal.     Nose: Nose normal.     Mouth/Throat:     Mouth: Mucous membranes are dry.  Eyes:     Extraocular Movements: Extraocular movements intact.     Pupils: Pupils are equal, round, and reactive to light.  Cardiovascular:     Rate and Rhythm: Normal rate and regular rhythm.     Pulses: Normal pulses.     Heart sounds: Normal heart sounds.  Pulmonary:     Effort: Pulmonary effort is normal.     Breath sounds: Normal breath sounds.  Abdominal:     General: Abdomen is flat. Bowel sounds are normal.     Palpations: Abdomen is soft.  Musculoskeletal:        General: Normal range of motion.     Cervical back: Normal range of motion and neck supple.  Skin:    General: Skin is warm and dry.     Capillary Refill: Capillary refill takes less than 2 seconds.  Neurological:     Mental Status: She is alert.  Psychiatric:        Mood and Affect: Mood normal.        Behavior: Behavior normal.        Thought Content: Thought content normal.        Judgment: Judgment normal.     Assessment/Plan Multiple metastatic disease of the brain from uterine cancer with right cerebellar metastasis that is dominant.  Plan: Suboccipital craniectomy  on the right for resection of brain metastatic lesion of the right cerebellum  Teresa Newport, MD 06/01/2021, 2:17 PM

## 2021-06-02 ENCOUNTER — Inpatient Hospital Stay (HOSPITAL_COMMUNITY): Payer: Medicare Other

## 2021-06-02 IMAGING — MR MR HEAD WO/W CM
12 of 14 series · 40 of 48 positions shown · IV contrast (Gadavist)
Comparison: Brain MRI [DATE]

CLINICAL DATA: Metastatic uterine cancer.  Status post treatment.

EXAM:
MRI HEAD WITHOUT AND WITH CONTRAST
TECHNIQUE: Multiplanar, multiecho pulse sequences of the brain and surrounding
structures were obtained without and with intravenous contrast.
CONTRAST:  5mL GADAVIST GADOBUTROL 1 MMOL/ML IV SOLN

[Series 5: DWI · axial · 3.0mm · 0.88mm/px · z∈[-99,+49]mm · 9 of 102 slices shown (1 of 4)]
[im 1/102]
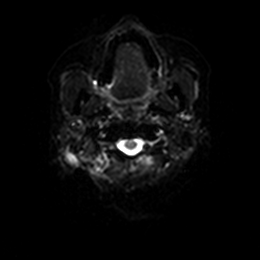
[im 13/102]
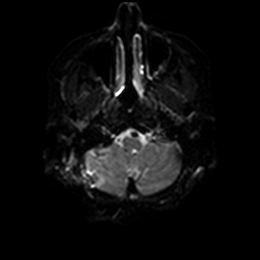
[im 26/102]
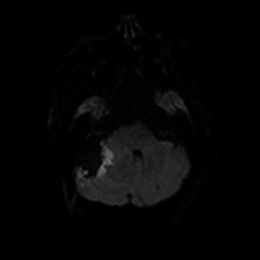
[im 38/102]
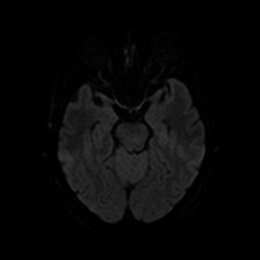
[im 51/102]
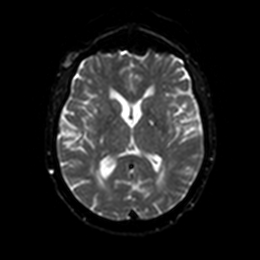
[im 64/102]
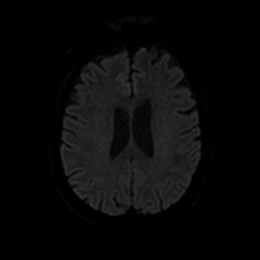
[im 76/102]
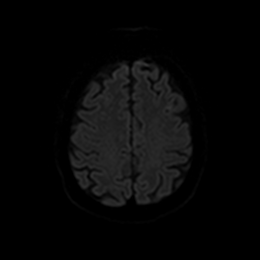
[im 89/102]
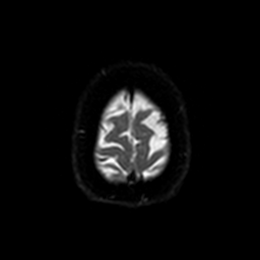
[im 102/102]
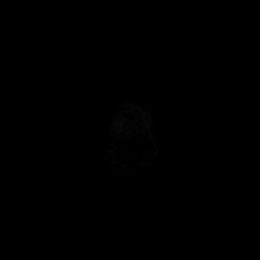

[Series 6: DWI · axial · 3.0mm · 0.88mm/px · z∈[-99,+49]mm · 4 of 51 slices shown (2 of 4)]
[im 1/51]
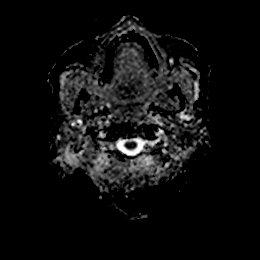
[im 17/51]
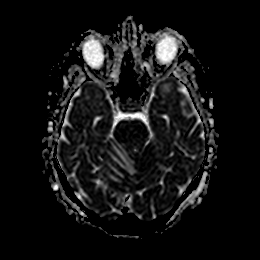
[im 34/51]
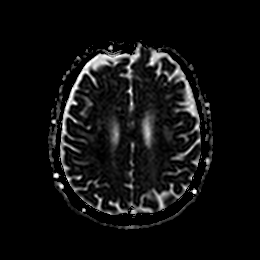
[im 51/51]
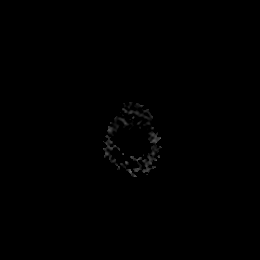

[Series 7: DWI · coronal · 4.0mm · 0.88mm/px · 5 of 66 slices shown (3 of 4)]
[im 1/66]
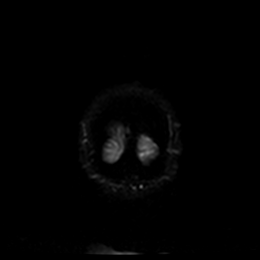
[im 17/66]
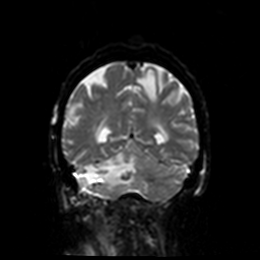
[im 33/66]
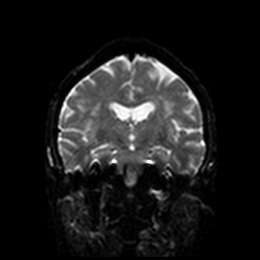
[im 49/66]
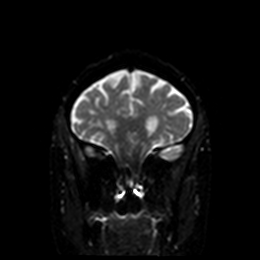
[im 66/66]
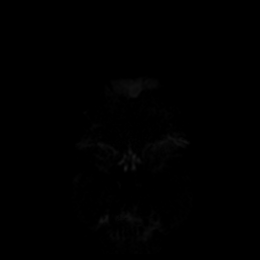

[Series 8: DWI · coronal · 4.0mm · 0.88mm/px · 2 of 33 slices shown (4 of 4)]
[im 1/33]
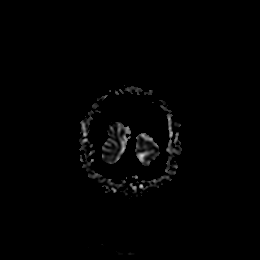
[im 33/33]
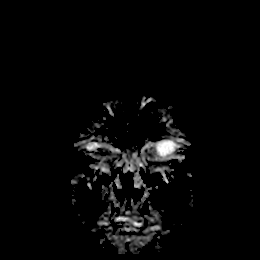

[Series 9: T1 · sagittal · 5.0mm · 0.75mm/px · 2 of 23 slices shown]
[im 1/23]
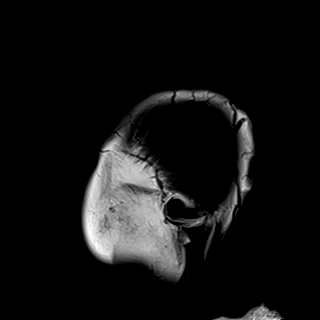
[im 23/23]
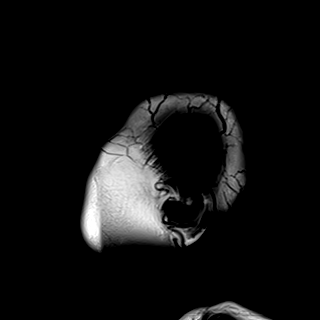

[Series 10: T2 · axial · 5.0mm · 0.72mm/px · z∈[-94,+48]mm · 2 of 25 slices shown]
[im 1/25]
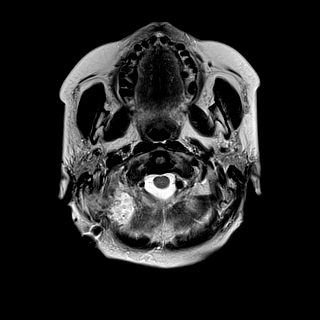
[im 25/25]
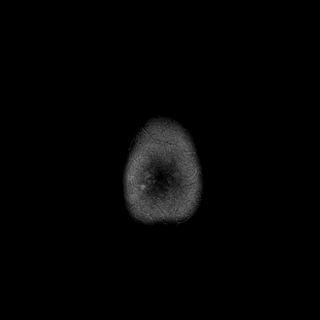

[Series 11: FLAIR · axial · 5.0mm · 0.45mm/px · z∈[-94,+49]mm · 2 of 25 slices shown]
[im 1/25]
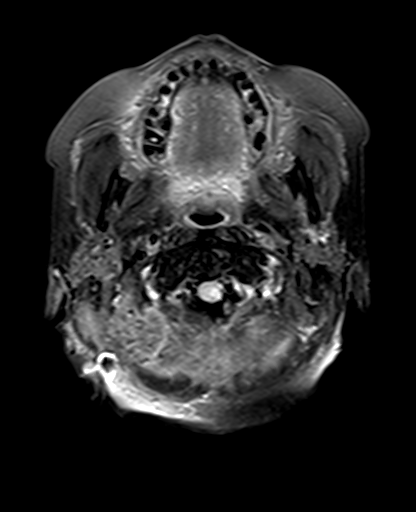
[im 25/25]
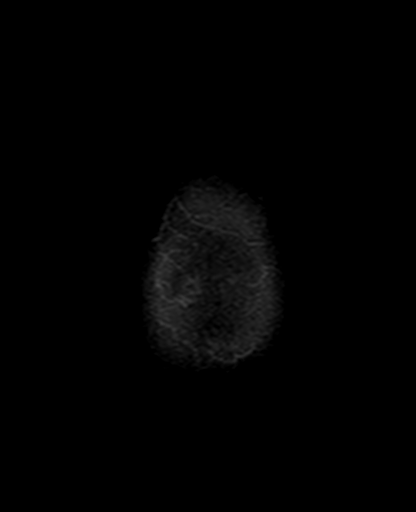

[Series 13: pha_images · axial · 3.0mm · 0.90mm/px · z∈[-110,+62]mm · 4 of 59 slices shown]
[im 1/59]
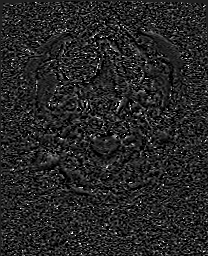
[im 20/59]
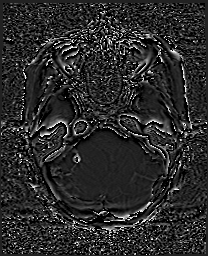
[im 39/59]
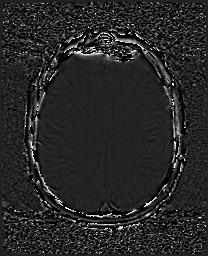
[im 59/59]
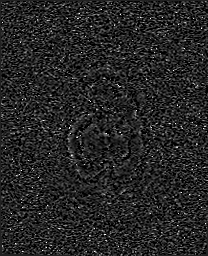

[Series 14: swi_images · axial · 3.0mm · 0.90mm/px · z∈[-110,+65]mm · 4 of 60 slices shown]
[im 1/60]
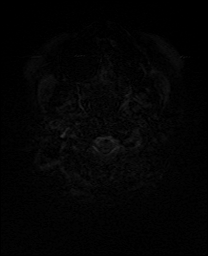
[im 20/60]
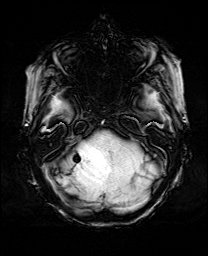
[im 40/60]
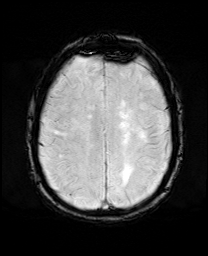
[im 60/60]
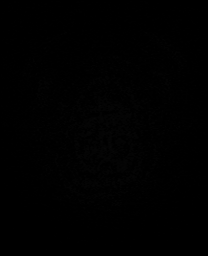

[Series 17: T2 post-contrast · coronal · 5.0mm · 0.72mm/px · 2 of 28 slices shown]
[im 1/28]
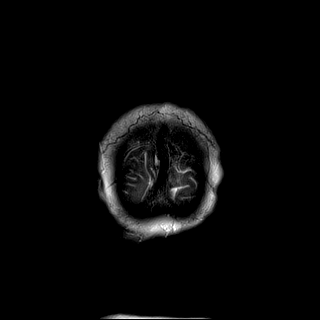
[im 28/28]
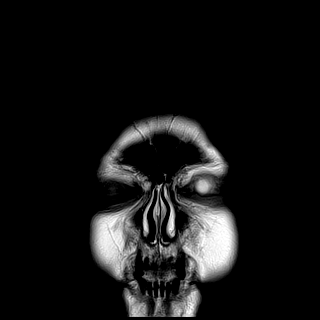

[Series 19: T1 post-contrast · coronal · 5.0mm · 0.34mm/px · 2 of 28 slices shown (1 of 2)]
[im 1/28]
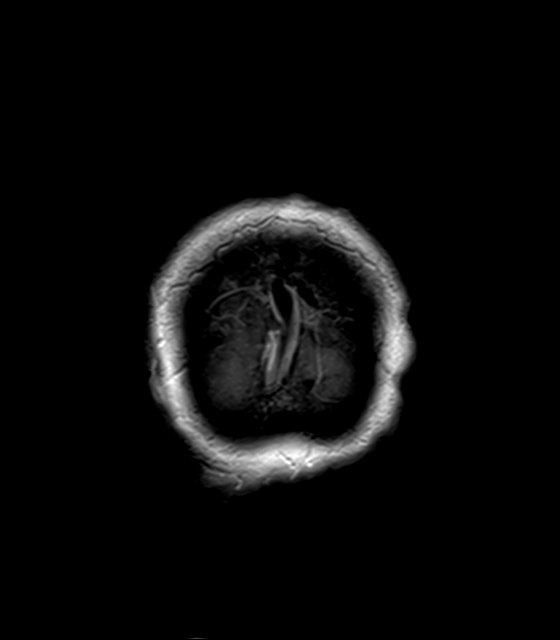
[im 28/28]
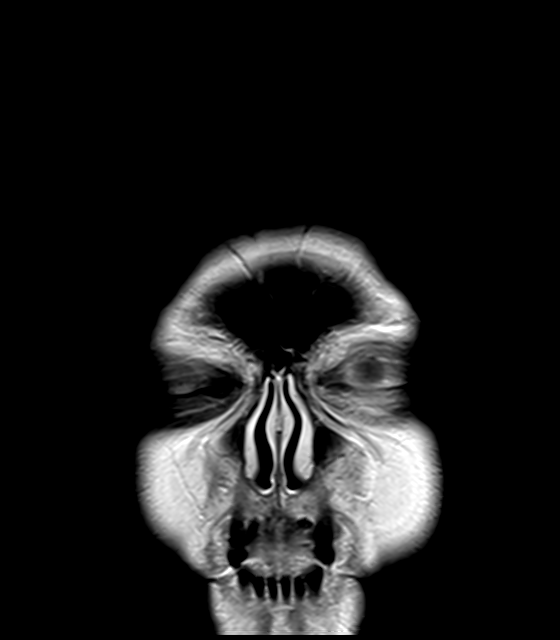

[Series 20: T1 post-contrast · sagittal · 5.0mm · 0.72mm/px · 2 of 23 slices shown (2 of 2)]
[im 1/23]
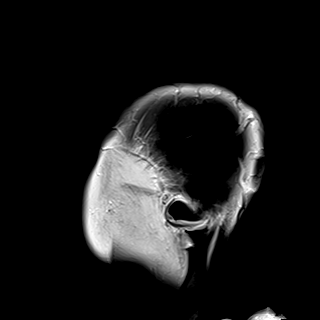
[im 23/23]
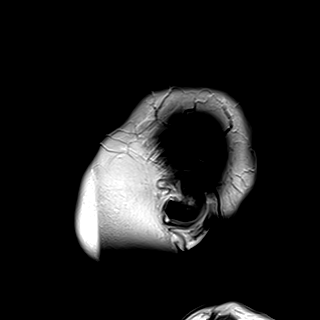

[40 of 48 positions shown; findings below may reference images not displayed]

FINDINGS: Brain: Status post resection of lesion in the lateral right
cerebellar hemisphere. There is a small amount of contrast
enhancement remaining at the margin of the resection site. Moderate
surrounding edema is unchanged. There is improved mass effect on the
fourth ventricle.

Punctate contrast enhancing lesion of the posterior left cerebellum
is unchanged (series 18, image 14). Lesions in the left frontal and
parietal lobes are also unchanged. The left frontal lobe lesion
again measures 6 x 5 mm (image 42) and the left parietal lesion
measures 10 x 12 mm (image 46). There are no new lesions.

Early confluent hyperintense T2-weighted signal of the
periventricular and deep white matter, most commonly due to chronic
ischemic microangiopathy. Mild generalized volume loss.

Vascular: Normal flow voids.

Skull and upper cervical spine: Right retromastoid craniotomy.
Unchanged right frontal scalp hematoma.

Sinuses/Orbits: Bilateral mastoid fluid.  Orbits are normal.

Other: None
IMPRESSION: 1. Status post resection of lesion in the lateral right cerebellar
hemisphere with unchanged surrounding edema and improved mass effect
on the fourth ventricle. Contrast enhancement at the resection
margin may be postsurgical. This will serve as baseline for future
studies.
2. Unchanged size of left frontal lobe, left cerebellum and left
parietal lobe metastases.
3. No new lesions.

## 2021-06-02 MED ORDER — PANTOPRAZOLE SODIUM 40 MG PO TBEC
40.0000 mg | DELAYED_RELEASE_TABLET | Freq: Every day | ORAL | Status: DC
  Administered 2021-06-02: 40 mg via ORAL
  Filled 2021-06-02: qty 1

## 2021-06-02 NOTE — Progress Notes (Signed)
°  Transition of Care (TOC) Screening Note   Patient Details  Name: Teresa Oliver Date of Birth: January 18, 1946   Transition of Care [facility]) CM/SW Contact:    Alfredia Ferguson, LCSW Phone Number: 06/02/2021, 8:14 AM    Transition of Care Department [facility]) has reviewed patient and no TOC needs have been identified at this time. We will continue to monitor patient advancement through interdisciplinary progression rounds. If new patient transition needs arise, please place a TOC consult.

## 2021-06-02 NOTE — Progress Notes (Addendum)
NEUROSURGERY PROGRESS NOTE   No issues overnight. Minimal HA this am. No nausea. Tolerating diet.  EXAM:  BP 140/78    Pulse 67    Temp 98 F (36.7 C) (Oral)    Resp 15    Ht 5' (1.524 m)    Wt 49.9 kg    SpO2 98%    BMI 21.48 kg/m   Awake, alert, oriented  Speech fluent, appropriate  CN grossly intact  5/5 BUE/BLE  Wound c/d/I, no drainage noted on dressing.  IMPRESSION:  76 y.o. female POD#1 s/p right retrosigmoid crani for resection of uterine met. Doing well.  PLAN: - d/c a-line, Foley - OOB today - Cont dex taper - Routine postop MRI w/w/o Fermin Schwab, MD [facility]

## 2021-06-03 ENCOUNTER — Encounter (HOSPITAL_COMMUNITY): Payer: Self-pay | Admitting: Neurological Surgery

## 2021-06-03 MED ORDER — GADOBUTROL 1 MMOL/ML IV SOLN
5.0000 mL | Freq: Once | INTRAVENOUS | Status: AC | PRN
  Administered 2021-06-03: 5 mL via INTRAVENOUS

## 2021-06-03 MED ORDER — METHYLPREDNISOLONE 4 MG PO TBPK: ORAL_TABLET | ORAL | 0 refills | Status: DC

## 2021-06-03 NOTE — Discharge Summary (Signed)
Physician Discharge Summary  Patient ID: Teresa Oliver MRN: [id number] DOB/AGE: 1946/03/14 76 y.o.  Admit date: 06/01/2021 Discharge date: 06/03/2021  Admission Diagnoses:  Metastatic brain tumor  Discharge Diagnoses:  Same Principal Problem:   Brain tumor [facility]) Active Problems:   Metastatic cancer [facility])   Discharged Condition: Stable  Hospital Course:  Teresa Oliver is a 76 y.o. female admitted after elective suboccipital retrosigmoid craniectomy for resection of brain metastasis from primary uterine cancer.  Patient was at neurologic baseline postoperatively and monitored in the intensive care unit.  She was ambulating well, tolerating diet with minimal nausea.  She had minimal headache.  On postoperative day 2 she requested discharge home.  Her postoperative MRI was unremarkable.  Treatments: Surgery -right retrosigmoid craniectomy for resection of tumor  Discharge Exam: Blood pressure (!) 145/79, pulse 64, temperature 97.8 F (36.6 C), temperature source Oral, resp. rate 14, height 5' (1.524 m), weight 49.9 kg, SpO2 99 %. Awake, alert, oriented Speech fluent, appropriate CN grossly intact 5/5 BUE/BLE Wound c/d/i  Disposition: Discharge disposition: 01-Home or Self Care       Discharge Instructions     Call MD for:  redness, tenderness, or signs of infection (pain, swelling, redness, odor or green/yellow discharge around incision site)   Complete by: As directed    Call MD for:  temperature >100.4   Complete by: As directed    Diet - low sodium heart healthy   Complete by: As directed    Discharge instructions   Complete by: As directed    Walk at home as much as possible, at least 4 times / day   Increase activity slowly   Complete by: As directed    Lifting restrictions   Complete by: As directed    No lifting > 10 lbs   May shower / Bathe   Complete by: As directed    48 hours after surgery   May walk up steps   Complete by: As directed    Other  Restrictions   Complete by: As directed    No bending/twisting at waist   Remove dressing in 24 hours   Complete by: As directed       Allergies as of 06/03/2021   No Known Allergies      Medication List     STOP taking these medications    dexamethasone 4 MG tablet Commonly known as: DECADRON       TAKE these medications    amLODipine 10 MG tablet Commonly known as: NORVASC Take 1 tablet (10 mg total) by mouth daily.   atenolol 50 MG tablet Commonly known as: TENORMIN TAKE 1 TABLET(50 MG) BY MOUTH DAILY What changed: See the new instructions.   cholecalciferol 25 MCG (1000 UNIT) tablet Commonly known as: VITAMIN D3 Take 1,000 Units by mouth daily.   doxazosin 4 MG tablet Commonly known as: Cardura Take 1 tablet (4 mg total) by mouth daily.   lidocaine-prilocaine cream Commonly known as: EMLA Apply 1 application topically daily as needed.   LORazepam 0.5 MG tablet Commonly known as: ATIVAN TAKE 1 TABLET(0.5 MG) BY MOUTH TWICE DAILY AS NEEDED FOR ANXIETY What changed:  how much to take how to take this when to take this reasons to take this additional instructions   MAGnesium-Oxide 400 (240 Mg) MG tablet Generic drug: magnesium oxide TAKE 1 TABLET(400 MG) BY MOUTH TWICE DAILY What changed: See the new instructions.   methylPREDNISolone 4 MG Tbpk tablet Commonly known as: MEDROL DOSEPAK  Take as directed on package   ondansetron 8 MG tablet Commonly known as: ZOFRAN Take 1 tablet (8 mg total) by mouth every 8 (eight) hours as needed for nausea.   oxybutynin 5 MG tablet Commonly known as: DITROPAN Take 1 tablet (5 mg total) by mouth every 8 (eight) hours as needed for bladder spasms.   oxyCODONE 5 MG immediate release tablet Commonly known as: Oxy IR/ROXICODONE Take 1 tablet (5 mg total) by mouth every 4 (four) hours as needed for severe pain.   polyethylene glycol 17 g packet Commonly known as: MIRALAX / GLYCOLAX Take 17 g by mouth 2 (two)  times daily as needed for moderate constipation.   prochlorperazine 10 MG tablet Commonly known as: COMPAZINE Take 1 tablet (10 mg total) by mouth every 6 (six) hours as needed for nausea or vomiting.   senna-docusate 8.6-50 MG tablet Commonly known as: Senokot-S Take 1 tablet by mouth 2 (two) times daily. What changed:  when to take this reasons to take this        Follow-up Information     Kristeen Miss, MD Follow up in 2 week(s).   Specialty: Neurosurgery Contact information: [address] [place] [address] [phone]                 Signed: Jairo Ben 06/03/2021, 11:09 AM

## 2021-06-03 NOTE — Progress Notes (Signed)
NEUROSURGERY PROGRESS NOTE   No issues overnight. Minimal HA this am. Tolerating diet. Ambulating well without assistance.  EXAM:  BP (!) 145/79    Pulse 64    Temp 97.8 F (36.6 C) (Oral)    Resp 14    Ht 5' (1.524 m)    Wt 49.9 kg    SpO2 99%    BMI 21.48 kg/m   Awake, alert, oriented  Speech fluent, appropriate  CN grossly intact  5/5 BUE/BLE  Wound c/d/I, no drainage noted on dressing.  IMAGING: MRI brain w/w/o reviewed and demonstrates minimal enhancement along the resection margin, likely postoperative. Otherwise unremakable postop scan.  IMPRESSION:  76 y.o. female POD#2 s/p right retrosigmoid crani for resection of uterine met. Doing well.  PLAN: - d/c home today   Consuella Lose, MD [facility]

## 2021-06-04 ENCOUNTER — Inpatient Hospital Stay: Payer: Medicare Other

## 2021-06-04 LAB — TYPE AND SCREEN
ABO/RH(D): A POS
Antibody Screen: NEGATIVE
Unit division: 0
Unit division: 0

## 2021-06-04 LAB — BPAM RBC
Blood Product Expiration Date: 202302022359
Blood Product Expiration Date: 202302022359
Unit Type and Rh: 5100
Unit Type and Rh: 5100

## 2021-06-06 LAB — SURGICAL PATHOLOGY

## 2021-06-11 ENCOUNTER — Inpatient Hospital Stay: Payer: Medicare Other | Attending: Hematology and Oncology

## 2021-06-11 ENCOUNTER — Telehealth: Payer: Self-pay

## 2021-06-11 NOTE — Telephone Encounter (Signed)
Called and given below message.  She is doing well and appreciated the call. She is sleeping and eating well. Doing PT at home. Her balance has improved. Scheduled appts on 2/3, she is aware of appt times and will call the office back if needed.

## 2021-06-11 NOTE — Telephone Encounter (Signed)
-----   Message from [name], MD sent at 06/11/2021 10:50 AM EST ----- Can you call and ask how she is doing? I would like to see her for 30 mins with labs and flush and discuss next step, I can see her Friday 2/3, if that works, pls schedule

## 2021-06-15 ENCOUNTER — Other Ambulatory Visit: Payer: Self-pay | Admitting: Neurological Surgery

## 2021-06-15 DIAGNOSIS — C7931 Secondary malignant neoplasm of brain: Secondary | ICD-10-CM

## 2021-06-19 ENCOUNTER — Other Ambulatory Visit: Payer: Self-pay | Admitting: Radiation Therapy

## 2021-06-22 ENCOUNTER — Encounter: Payer: Self-pay | Admitting: Hematology and Oncology

## 2021-06-22 ENCOUNTER — Other Ambulatory Visit: Payer: Self-pay

## 2021-06-22 ENCOUNTER — Inpatient Hospital Stay: Payer: Medicare Other | Attending: Hematology and Oncology

## 2021-06-22 ENCOUNTER — Inpatient Hospital Stay: Payer: Medicare Other | Admitting: Hematology and Oncology

## 2021-06-22 VITALS — BP 124/73 | HR 84 | Temp 97.7°F | Resp 18 | Ht 60.0 in | Wt 110.8 lb

## 2021-06-22 DIAGNOSIS — K449 Diaphragmatic hernia without obstruction or gangrene: Secondary | ICD-10-CM | POA: Diagnosis not present

## 2021-06-22 DIAGNOSIS — I3481 Nonrheumatic mitral (valve) annulus calcification: Secondary | ICD-10-CM | POA: Diagnosis not present

## 2021-06-22 DIAGNOSIS — C7931 Secondary malignant neoplasm of brain: Secondary | ICD-10-CM | POA: Insufficient documentation

## 2021-06-22 DIAGNOSIS — D638 Anemia in other chronic diseases classified elsewhere: Secondary | ICD-10-CM | POA: Diagnosis not present

## 2021-06-22 DIAGNOSIS — N183 Chronic kidney disease, stage 3 unspecified: Secondary | ICD-10-CM | POA: Diagnosis not present

## 2021-06-22 DIAGNOSIS — C549 Malignant neoplasm of corpus uteri, unspecified: Secondary | ICD-10-CM

## 2021-06-22 DIAGNOSIS — R809 Proteinuria, unspecified: Secondary | ICD-10-CM | POA: Diagnosis not present

## 2021-06-22 DIAGNOSIS — Z79899 Other long term (current) drug therapy: Secondary | ICD-10-CM | POA: Diagnosis not present

## 2021-06-22 DIAGNOSIS — Z5112 Encounter for antineoplastic immunotherapy: Secondary | ICD-10-CM | POA: Diagnosis not present

## 2021-06-22 DIAGNOSIS — Z9221 Personal history of antineoplastic chemotherapy: Secondary | ICD-10-CM | POA: Diagnosis not present

## 2021-06-22 DIAGNOSIS — R634 Abnormal weight loss: Secondary | ICD-10-CM | POA: Insufficient documentation

## 2021-06-22 DIAGNOSIS — Z8673 Personal history of transient ischemic attack (TIA), and cerebral infarction without residual deficits: Secondary | ICD-10-CM | POA: Diagnosis not present

## 2021-06-22 DIAGNOSIS — C55 Malignant neoplasm of uterus, part unspecified: Secondary | ICD-10-CM | POA: Diagnosis present

## 2021-06-22 DIAGNOSIS — I700 Atherosclerosis of aorta: Secondary | ICD-10-CM | POA: Insufficient documentation

## 2021-06-22 DIAGNOSIS — I10 Essential (primary) hypertension: Secondary | ICD-10-CM | POA: Diagnosis not present

## 2021-06-22 DIAGNOSIS — I129 Hypertensive chronic kidney disease with stage 1 through stage 4 chronic kidney disease, or unspecified chronic kidney disease: Secondary | ICD-10-CM | POA: Insufficient documentation

## 2021-06-22 DIAGNOSIS — Z923 Personal history of irradiation: Secondary | ICD-10-CM | POA: Diagnosis not present

## 2021-06-22 DIAGNOSIS — J341 Cyst and mucocele of nose and nasal sinus: Secondary | ICD-10-CM | POA: Diagnosis not present

## 2021-06-22 DIAGNOSIS — D539 Nutritional anemia, unspecified: Secondary | ICD-10-CM | POA: Diagnosis not present

## 2021-06-22 LAB — CMP (CANCER CENTER ONLY)
ALT: 8 U/L (ref 0–44)
AST: 12 U/L — ABNORMAL LOW (ref 15–41)
Albumin: 3.7 g/dL (ref 3.5–5.0)
Alkaline Phosphatase: 71 U/L (ref 38–126)
Anion gap: 8 (ref 5–15)
BUN: 27 mg/dL — ABNORMAL HIGH (ref 8–23)
CO2: 24 mmol/L (ref 22–32)
Calcium: 9.5 mg/dL (ref 8.9–10.3)
Chloride: 105 mmol/L (ref 98–111)
Creatinine: 1.37 mg/dL — ABNORMAL HIGH (ref 0.44–1.00)
GFR, Estimated: 40 mL/min — ABNORMAL LOW (ref >60)
Glucose, Bld: 109 mg/dL — ABNORMAL HIGH (ref 70–99)
Potassium: 4.2 mmol/L (ref 3.5–5.1)
Sodium: 137 mmol/L (ref 135–145)
Total Bilirubin: 0.4 mg/dL (ref 0.3–1.2)
Total Protein: 7 g/dL (ref 6.5–8.1)

## 2021-06-22 LAB — TSH: TSH: 2.272 u[IU]/mL (ref 0.308–3.960)

## 2021-06-22 LAB — CBC WITH DIFFERENTIAL (CANCER CENTER ONLY)
Abs Immature Granulocytes: 0.07 10*3/uL (ref 0.00–0.07)
Basophils Absolute: 0 10*3/uL (ref 0.0–0.1)
Basophils Relative: 1 %
Eosinophils Absolute: 0.3 10*3/uL (ref 0.0–0.5)
Eosinophils Relative: 7 %
HCT: 25.9 % — ABNORMAL LOW (ref 36.0–46.0)
Hemoglobin: 8.1 g/dL — ABNORMAL LOW (ref 12.0–15.0)
Immature Granulocytes: 2 %
Lymphocytes Relative: 21 %
Lymphs Abs: 0.9 10*3/uL (ref 0.7–4.0)
MCH: 22.1 pg — ABNORMAL LOW (ref 26.0–34.0)
MCHC: 31.3 g/dL (ref 30.0–36.0)
MCV: 70.8 fL — ABNORMAL LOW (ref 80.0–100.0)
Monocytes Absolute: 0.6 10*3/uL (ref 0.1–1.0)
Monocytes Relative: 13 %
Neutro Abs: 2.4 10*3/uL (ref 1.7–7.7)
Neutrophils Relative %: 56 %
Platelet Count: 251 10*3/uL (ref 150–400)
RBC: 3.66 MIL/uL — ABNORMAL LOW (ref 3.87–5.11)
RDW: 16.6 % — ABNORMAL HIGH (ref 11.5–15.5)
WBC Count: 4.2 10*3/uL (ref 4.0–10.5)
nRBC: 0 % (ref 0.0–0.2)

## 2021-06-22 LAB — TOTAL PROTEIN, URINE DIPSTICK: Protein, ur: NEGATIVE mg/dL

## 2021-06-22 MED ORDER — HEPARIN SOD (PORK) LOCK FLUSH 100 UNIT/ML IV SOLN
500.0000 [IU] | Freq: Once | INTRAVENOUS | Status: AC
  Administered 2021-06-22: 500 [IU]

## 2021-06-22 MED ORDER — SODIUM CHLORIDE 0.9% FLUSH
10.0000 mL | Freq: Once | INTRAVENOUS | Status: AC
  Administered 2021-06-22: 10 mL

## 2021-06-22 NOTE — Assessment & Plan Note (Signed)
She has chronic kidney disease, multifactorial, related to aging, hypertension and history of hydronephrosis Observe closely

## 2021-06-22 NOTE — Assessment & Plan Note (Signed)
She is recovering very well since her recent neurosurgery with no significant residual neurological deficit She has completed outpatient physical therapy and rehab I plan to repeat imaging of the chest, abdomen and pelvis in 2 weeks and we will discuss treatment options in 2 weeks She is in agreement

## 2021-06-22 NOTE — Progress Notes (Signed)
[place] OFFICE PROGRESS NOTE  Patient Care Team: [facility] as PCP - General (Rheumatology) [name], [name], RN as Oncology Nurse Navigator (Oncology)  ASSESSMENT & PLAN:  Uterine cancer [facility]) She is recovering very well since her recent neurosurgery with no significant residual neurological deficit She has completed outpatient physical therapy and rehab I plan to repeat imaging of the chest, abdomen and pelvis in 2 weeks and we will discuss treatment options in 2 weeks She is in agreement  Deficiency anemia She has multifactorial anemia, combination of thalassemia and recent anemia chronic illness Her blood count has stabilized since we switch her treatment Monitor closely She denies recent bleeding  Essential hypertension Previously, she had uncontrolled hypertension while on Lenvima Since discontinuation of the Lenvima, her blood pressure has normalized and she has no signs of proteinuria Recommend discontinuation of amlodipine or Cardura, depending on which prescription will run out first I plan to see her again in 2 weeks for further follow-up  CKD (chronic kidney disease), stage III ([place]) She has chronic kidney disease, multifactorial, related to aging, hypertension and history of hydronephrosis Observe closely  Orders Placed This Encounter  Procedures   CT CHEST ABDOMEN PELVIS W CONTRAST    Standing Status:   Future    Standing Expiration Date:   06/22/2022    Order Specific Question:   Preferred imaging location?    Answer:   [facility]    Order Specific Question:   Radiology Contrast Protocol - do NOT remove file path    Answer:   [email or web address]    All questions were answered. The patient knows to call the clinic with any problems, questions or concerns. The total time spent in the appointment was 30 minutes encounter with patients including review of chart and various tests  results, discussions about plan of care and coordination of care plan   [name], MD 06/22/2021 10:27 AM  INTERVAL HISTORY: Please see below for problem oriented charting. she returns for treatment follow-up She is doing well after her brain surgery She has no residual focal neurological deficits, headaches or nausea She has completed physical therapy and rehab Her blood pressure is normal Denies recent chest pain or shortness of breath  REVIEW OF SYSTEMS:   Constitutional: Denies fevers, chills or abnormal weight loss Eyes: Denies blurriness of vision Ears, nose, mouth, throat, and face: Denies mucositis or sore throat Respiratory: Denies cough, dyspnea or wheezes Cardiovascular: Denies palpitation, chest discomfort or lower extremity swelling Gastrointestinal:  Denies nausea, heartburn or change in bowel habits Skin: Denies abnormal skin rashes Lymphatics: Denies new lymphadenopathy or easy bruising Neurological:Denies numbness, tingling or new weaknesses Behavioral/Psych: Mood is stable, no new changes  All other systems were reviewed with the patient and are negative.  I have reviewed the past medical history, past surgical history, social history and family history with the patient and they are unchanged from previous note.  ALLERGIES:  has No Known Allergies.  MEDICATIONS:  Current Outpatient Medications  Medication Sig Dispense Refill   amLODipine (NORVASC) 10 MG tablet Take 1 tablet (10 mg total) by mouth daily. 90 tablet 2   atenolol (TENORMIN) 50 MG tablet TAKE 1 TABLET(50 MG) BY MOUTH DAILY (Patient taking differently: Take 50 mg by mouth daily.) 30 tablet 2   cholecalciferol (VITAMIN D3) 25 MCG (1000 UNIT) tablet Take 1,000 Units by mouth daily.     doxazosin (CARDURA) 4 MG tablet Take 1 tablet (4 mg total) by mouth daily. [place]  tablet 3   lidocaine-prilocaine (EMLA) cream Apply 1 application topically daily as needed. 30 g 3   LORazepam (ATIVAN) 0.5 MG tablet TAKE 1  TABLET(0.5 MG) BY MOUTH TWICE DAILY AS NEEDED FOR ANXIETY (Patient taking differently: Take 0.5 mg by mouth 2 (two) times daily as needed for anxiety.) 60 tablet 0   MAGNESIUM-OXIDE 400 (240 Mg) MG tablet TAKE 1 TABLET(400 MG) BY MOUTH TWICE DAILY (Patient taking differently: Take 400 mg by mouth 2 (two) times daily.) 60 tablet 1   ondansetron (ZOFRAN) 8 MG tablet Take 1 tablet (8 mg total) by mouth every 8 (eight) hours as needed for nausea. 30 tablet 3   prochlorperazine (COMPAZINE) 10 MG tablet Take 1 tablet (10 mg total) by mouth every 6 (six) hours as needed for nausea or vomiting. 30 tablet 1   No current facility-administered medications for this visit.    SUMMARY OF ONCOLOGIC HISTORY: Oncology History Overview Note  High grade serous MMR normal Her2 negative   Uterine cancer ([place])  08/28/2020 Imaging   1. Pelvic mass measuring 7.5 x 4.8 cm, contiguous with the sigmoid colon and uterus with loss of fat planes. Site of origin is unclear, may be ovarian, uterine, or colonic. Lack of contrast limits detailed assessment. 2. Above pelvic mass causes partial obstruction of the left ureter with mild left hydroureteronephrosis. 3. Multifocal adenopathy in the abdomen and pelvis, suspicious for metastatic disease, primarily in the mesentery and lower peritoneum and pelvis. Please note that detailed assessment of adenopathy is limited on this noncontrast exam. Recommend oncology referral.  4. Wall thickening of the sigmoid colon distal to the left pelvic mass with pericolonic edema, suspicious for colitis. There is a moderate to large amount of stool in the more proximal colon, suggesting pelvic mass may be causing delayed colonic transit. 5. Small to moderate hiatal hernia.   08/28/2020 - 09/04/2020 Hospital Admission   The patient has been complaining of reduced urine output, loss of appetite and changes in bowel habits She had abnormal imaging study leading to hospitalization, colonoscopy,  biopsy and subsequently chemotherapy   08/30/2020 Procedure   CT-guided biopsy of enlarged left external iliac chain lymph node as above   08/30/2020 Pathology Results   SURGICAL PATHOLOGY  CASE: [id number]  PATIENT: Teresa Oliver  Surgical Pathology Report  Clinical History: Pelvic mass with metastatic lymph adenopathy (crm)  FINAL MICROSCOPIC DIAGNOSIS:   A. LYMPH NODE, LEFT EXTERNAL ILIAC CHAIN, BIOPSY:  - Poorly differentiated carcinoma with necrosis.  - See comment.   COMMENT:  The carcinoma is characterized by diffuse sheets of malignant epithelioid cells with prominent nucleoli and frequent mitotic figures. Immunohistochemistry is positive with cytokeratin 7 and PAX 8 with weak  positivity with estrogen receptor and WT1.  P53 shows diffuse strong nuclear positivity.  The carcinoma is negative with cytokeratin 20, CDX2, GATA3, GCDFP, TTF-1, HepPar 1, arginase 1 and CD10.  The  morphology and immunophenotype are most consistent with a gynecologic primary including high-grade serous carcinoma.    08/31/2020 Procedure   Colonoscopy - Preparation of the colon was fair. - Likely malignant partially obstructing tumor in the sigmoid colon (appears extrinsic process). Biopsied. Tattooed. - Internal hemorrhoids. - Stool in the entire examined colon   09/01/2020 Initial Diagnosis   Uterine cancer ([place])   09/01/2020 Procedure   Successful placement of a power injectable Port-A-Cath via the right internal jugular vein. The catheter is ready for immediate use.   09/04/2020 - 12/29/2020 Chemotherapy    Patient is on  Treatment Plan: UTERINE CARBOPLATIN AUC 6 / PACLITAXEL Q21D       09/07/2020 Cancer Staging   Staging form: Corpus Uteri - Carcinoma and Carcinosarcoma, AJCC 8th Edition - Clinical: FIGO Stage IVA (cT4, cN2a, cM0) - Signed by [name], MD on 09/07/2020    11/15/2020 Imaging   1. Marked interval response to therapy with decrease in size of the mass extending from the  uterus to the colon that was seen on the previous examination. Also with decreased size and or resolution of pelvic, mesenteric and retroperitoneal adenopathy seen on previous imaging. 2. Tethering of the colon with area that raise the question of developing colovaginal fistula versus internal enhancement within the mass that is now decreased in size. Attention on follow-up is suggested. Correlate with any current signs of vaginal drainage. 3. LEFT thoracic inlet adenopathy compatible with involvement in the chest. Area not imaged on previous imaging.   02/18/2021 Imaging   1. Interval progression of left supraclavicular,, left thoracic inlet, left subpectoral, retroperitoneal, mesenteric and pelvic sidewall lymphadenopathy. Metastatic deposits in the central pelvis are also progressive. 2. Interval progression of left subpectoral lymphadenopathy with thrombus now visible in the left subclavian/innominate vein. 3. Aortic Atherosclerosis (ICD10-I70.0).   02/23/2021 - 06/15/2021 Chemotherapy   Patient is on Treatment Plan : UTERINE Lenvatinib + Pembrolizumab q21d     05/23/2021 Imaging   1. Decreased size of pelvic soft tissue lesions. 2. Decreased size of left supraclavicular and left subpectoral lymph nodes. 3. Overall decreased size of abdominal and pelvic lymph nodes; however, a few left-sided pelvic and inguinal lymph nodes have increased in size. 4. New mild compression deformity of the inferior endplate of L3. 5.  Aortic Atherosclerosis (ICD10-I70.0).     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  Operating microscope.,  Microdissection technique. Surgeon: Kristeen Miss First Assistant: Consuella Lose MD    06/01/2021 Pathology Results   SURGICAL PATHOLOGY  CASE: [phone]   PATIENT: Teresa Oliver  Surgical Pathology Report    FINAL MICROSCOPIC DIAGNOSIS:   A. BRAIN TUMOR, RIGHT CEREBELLAR, RESECTION:  Poorly differentiated carcinoma with morphology and immunoprofile consistent with metastatic previously diagnosed high-grade serous carcinoma.  Clinical correlation is required.   COMMENT:   Immunohistochemistry with appropriate controls for single antibody is positive for CK7, PAX8, WT1 and p53 and is negative for GFAP, CK20 and ER.      PHYSICAL EXAMINATION: ECOG PERFORMANCE STATUS: 1 - Symptomatic but completely ambulatory  Vitals:   06/22/21 0934  BP: 124/73  Pulse: 84  Resp: 18  Temp: 97.7 F (36.5 C)  SpO2: 99%   Filed Weights   06/22/21 0934  Weight: 110 lb 12.8 oz (50.3 kg)    GENERAL:alert, no distress and comfortable SKIN: skin color, texture, turgor are normal, no rashes or significant lesions EYES: normal, Conjunctiva are pink and non-injected, sclera clear OROPHARYNX:no exudate, no erythema and lips, buccal mucosa, and tongue normal  NECK: supple, thyroid normal size, non-tender, without nodularity LYMPH:  no palpable lymphadenopathy in the cervical, axillary or inguinal LUNGS: clear to auscultation and percussion with normal breathing effort HEART: regular rate & rhythm and no murmurs and no lower extremity edema ABDOMEN:abdomen soft, non-tender and normal bowel sounds Musculoskeletal:no cyanosis of digits and no clubbing  NEURO: alert & oriented x 3 with fluent speech, no focal motor/sensory deficits  LABORATORY DATA:  I have reviewed the data as listed    Component Value Date/Time   NA 137 06/22/2021 0856   K 4.2 06/22/2021 0856   CL 105 06/22/2021 0856   CO2 24 06/22/2021 0856   GLUCOSE 109 (H) 06/22/2021 0856   BUN 27 (H) 06/22/2021 0856   CREATININE 1.37 (H) 06/22/2021 0856   CALCIUM 9.5 06/22/2021 0856   PROT 7.0 06/22/2021 0856   ALBUMIN 3.7 06/22/2021 0856   AST 12 (L) 06/22/2021 0856   ALT 8 06/22/2021  0856   ALKPHOS 71 06/22/2021 0856   BILITOT 0.4 06/22/2021 0856   GFRNONAA 40 (L) 06/22/2021 0856    No results found for: SPEP, UPEP  Lab Results  Component Value Date   WBC 4.2 06/22/2021   NEUTROABS 2.4 06/22/2021   HGB 8.1 (L) 06/22/2021   HCT 25.9 (L) 06/22/2021   MCV 70.8 (L) 06/22/2021   PLT 251 06/22/2021      Chemistry      Component Value Date/Time   NA 137 06/22/2021 0856   K 4.2 06/22/2021 0856   CL 105 06/22/2021 0856   CO2 24 06/22/2021 0856   BUN 27 (H) 06/22/2021 0856   CREATININE 1.37 (H) 06/22/2021 0856      Component Value Date/Time   CALCIUM 9.5 06/22/2021 0856   ALKPHOS 71 06/22/2021 0856   AST 12 (L) 06/22/2021 0856   ALT 8 06/22/2021 0856   BILITOT 0.4 06/22/2021 0856

## 2021-06-22 NOTE — Assessment & Plan Note (Signed)
Previously, she had uncontrolled hypertension while on Lenvima Since discontinuation of the Lenvima, her blood pressure has normalized and she has no signs of proteinuria Recommend discontinuation of amlodipine or Cardura, depending on which prescription will run out first I plan to see her again in 2 weeks for further follow-up

## 2021-06-22 NOTE — Assessment & Plan Note (Signed)
She has multifactorial anemia, combination of thalassemia and recent anemia chronic illness Her blood count has stabilized since we switch her treatment Monitor closely She denies recent bleeding

## 2021-06-23 LAB — T4: T4, Total: 7.3 ug/dL (ref 4.5–12.0)

## 2021-07-04 ENCOUNTER — Ambulatory Visit: Payer: Self-pay | Admitting: Urology

## 2021-07-04 ENCOUNTER — Ambulatory Visit (HOSPITAL_COMMUNITY)
Admission: RE | Admit: 2021-07-04 | Discharge: 2021-07-04 | Disposition: A | Discharge status: Home / Self Care | Payer: Medicare Other | Source: Ambulatory Visit | Attending: Hematology and Oncology | Admitting: Hematology and Oncology

## 2021-07-04 ENCOUNTER — Other Ambulatory Visit: Payer: Self-pay

## 2021-07-04 DIAGNOSIS — C549 Malignant neoplasm of corpus uteri, unspecified: Secondary | ICD-10-CM | POA: Insufficient documentation

## 2021-07-04 IMAGING — CT CT CHEST-ABD-PELV W/ CM
2 of 5 series · 12 of 36 positions shown, 14 images · IV contrast (OMNIPAQUE)
Comparison: CT the chest, abdomen and pelvis [DATE].

CLINICAL DATA: 76-year-old female with history of uterine/cervical
cancer diagnosed in [Z6] status post chemotherapy which is complete,
as well as radiation therapy to the brain which is now complete.
Follow-up study.

EXAM:
CT CHEST, ABDOMEN, AND PELVIS WITH CONTRAST
TECHNIQUE: Multidetector CT imaging of the chest, abdomen and pelvis was
performed following the standard protocol during bolus
administration of intravenous contrast.

[Series 2: cap with · axial · 0.65mm/px · z∈[-528,-54]mm · 9 of 117 slices shown, 11 images]
[im 11/117  mediastinal]
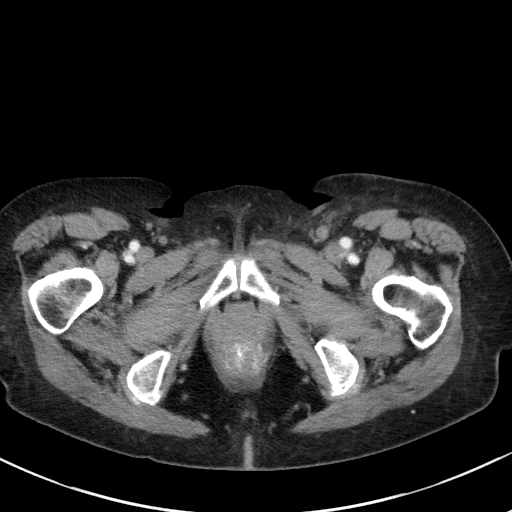
[im 11/117  bone]
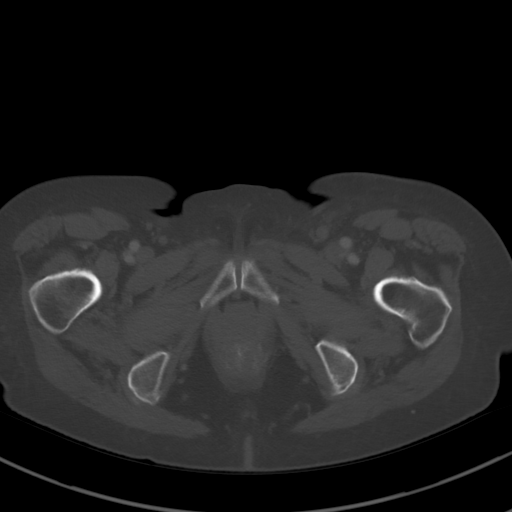
[im 22/117  mediastinal]
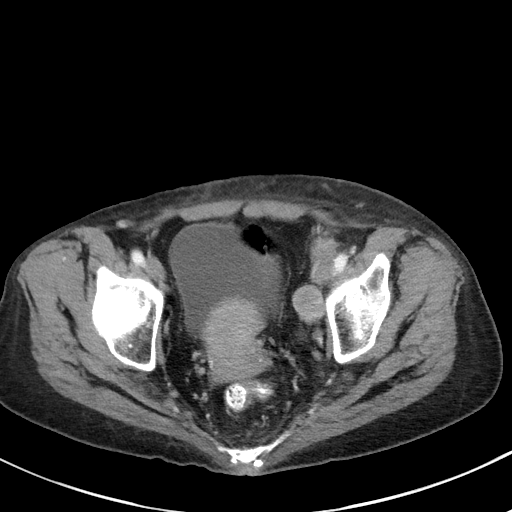
[im 32/117  mediastinal]
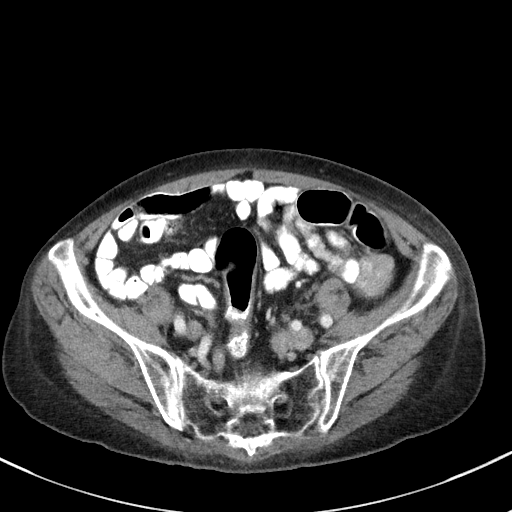
[im 43/117  mediastinal]
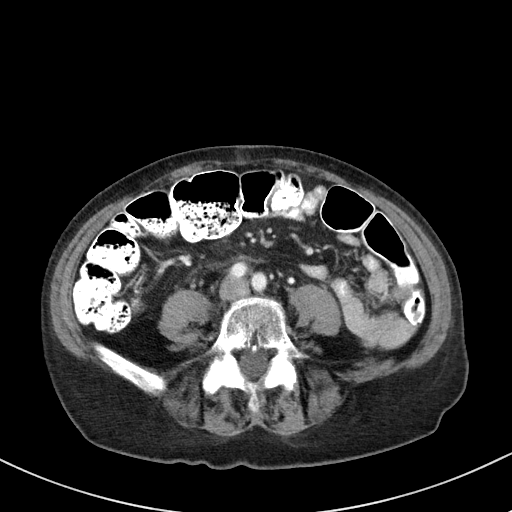
[im 64/117  mediastinal]
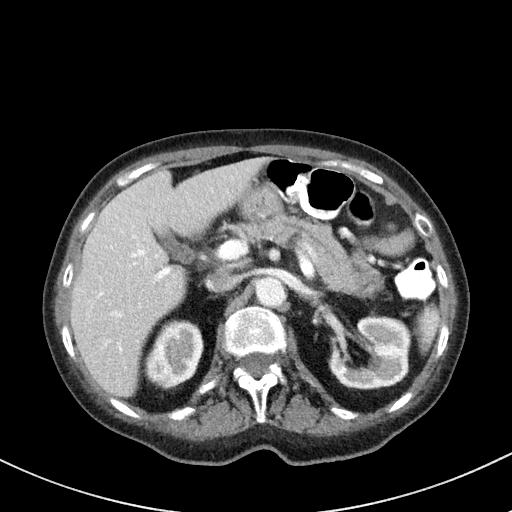
[im 74/117  mediastinal]
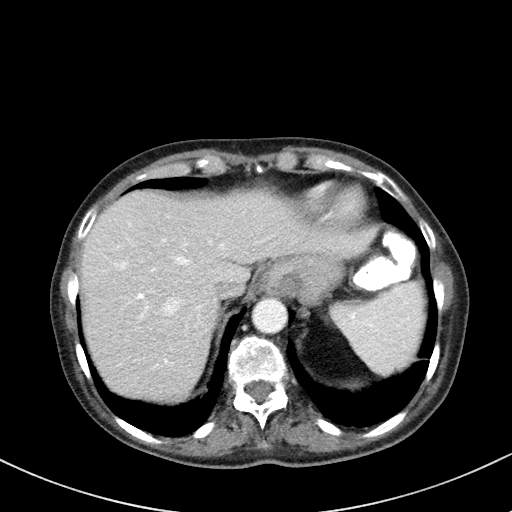
[im 85/117  mediastinal]
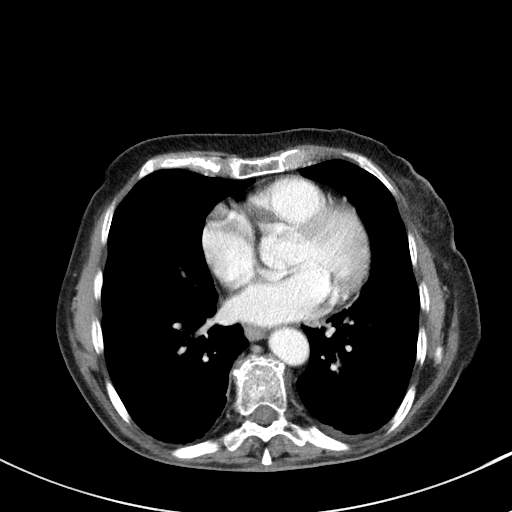
[im 95/117  mediastinal]
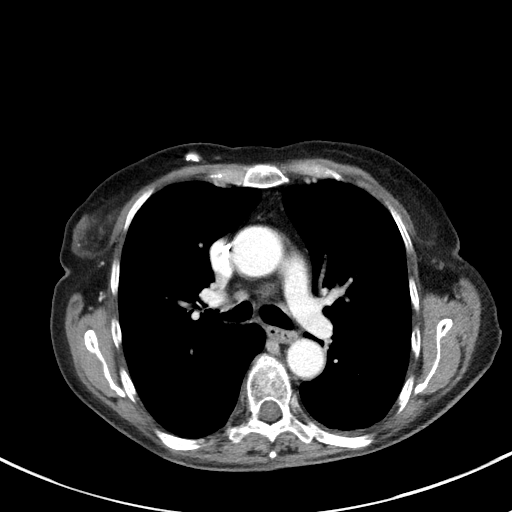
[im 106/117  mediastinal]
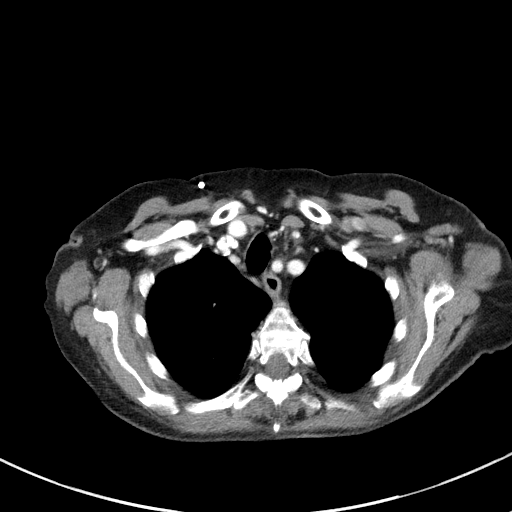
[im 106/117  bone]
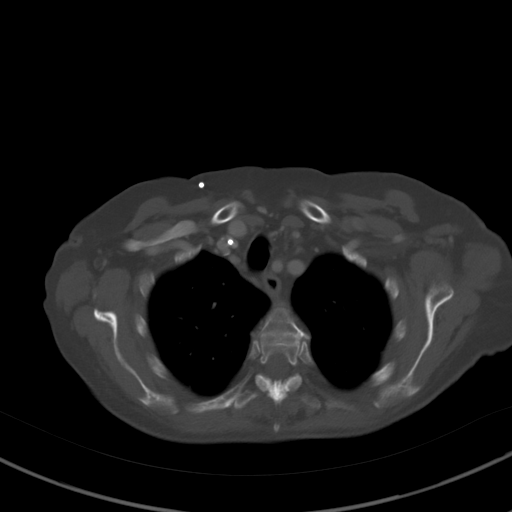

[Series 4: coronals · coronal · 0.67mm/px · 3 of 127 slices shown]
[im 26/127  mediastinal]
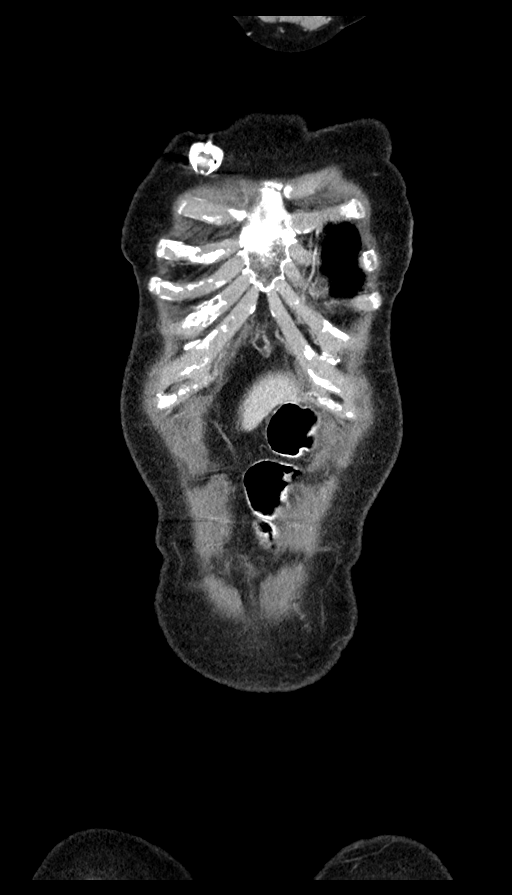
[im 51/127  mediastinal]
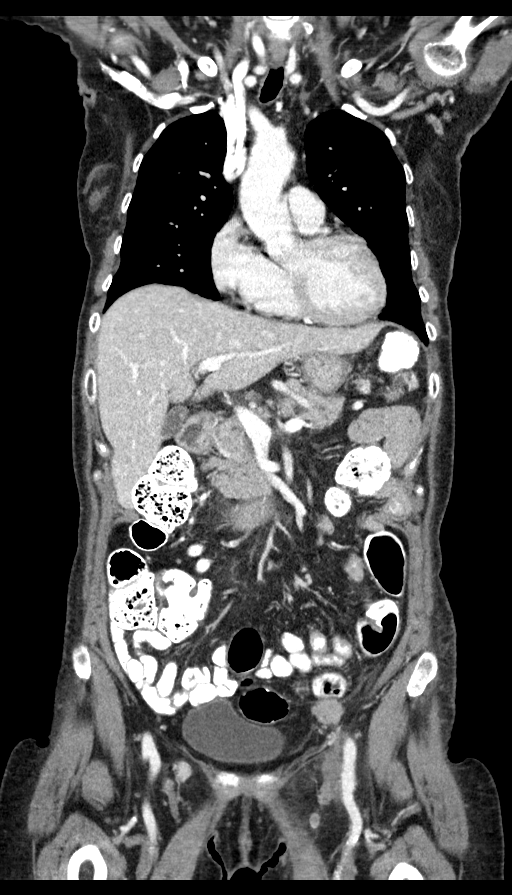
[im 76/127  mediastinal]
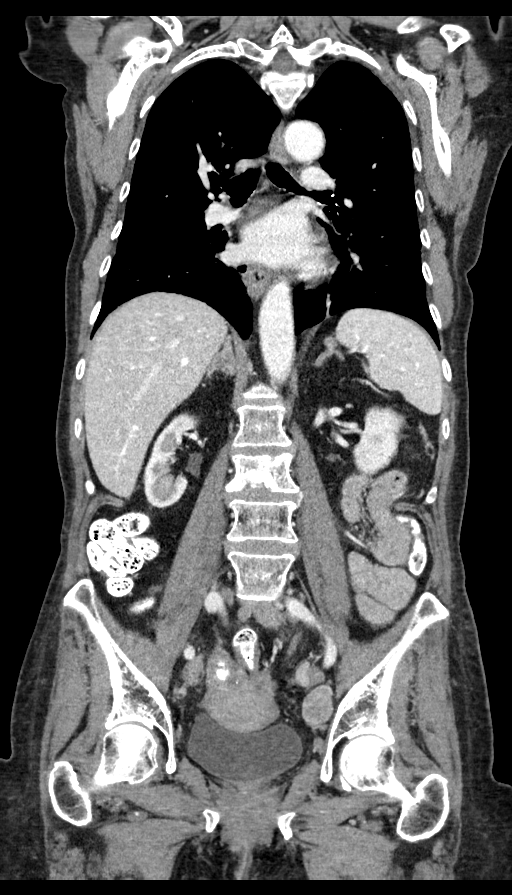

[12 of 36 positions shown; findings below may reference images not displayed]

RADIATION DOSE REDUCTION: This exam was performed according to the
departmental dose-optimization program which includes automated
exposure control, adjustment of the mA and/or kV according to
patient size and/or use of iterative reconstruction technique.

CONTRAST:  75mL OMNIPAQUE IOHEXOL 300 MG/ML  SOLN
FINDINGS: CT CHEST FINDINGS

Cardiovascular: Heart size is normal. There is no significant
pericardial fluid, thickening or pericardial calcification. No
atherosclerotic calcifications are noted in the thoracic aorta or
the coronary arteries. Right internal jugular single-lumen porta
cath with tip terminating at the superior cavoatrial junction.

Mediastinum/Nodes: No pathologically enlarged mediastinal, hilar or
internal mammary lymph nodes. Small hiatal hernia. No axillary or
subpectoral lymphadenopathy.

Lungs/Pleura: No suspicious appearing pulmonary nodules or masses
are noted. No acute consolidative airspace disease. No pleural
effusions. Areas of mild linear scarring are noted in the lung bases
bilaterally.

Musculoskeletal: Sclerotic lesion in the sternum (sagittal image 90
of series 5), new compared to prior examinations, concerning for
probable metastatic lesion. Additional small sclerotic lesion in the
posterior aspect of the inferior endplate of T1 (sagittal image 96
of series 5), suspicious for an additional metastatic lesion.
Compression fracture of T5 with 25% loss of anterior vertebral body
height, unchanged.

CT ABDOMEN PELVIS FINDINGS

Hepatobiliary: No suspicious cystic or solid hepatic lesions. No
intra or extrahepatic biliary ductal dilatation. Gallbladder is
normal in appearance.

Pancreas: No pancreatic mass. No pancreatic ductal dilatation. No
pancreatic or peripancreatic fluid collections or inflammatory
changes.

Spleen: Unremarkable.

Adrenals/Urinary Tract: New nodularity in the right adrenal gland
concerning for potential metastatic lesion (axial image 48 of series
2) measuring 1.5 x 1.3 cm. Bilateral kidneys and left adrenal gland
are normal in appearance. No hydroureteronephrosis. Urinary bladder
is normal in appearance.

Stomach/Bowel: The appearance of the stomach is normal. There is no
pathologic dilatation of small bowel or colon. Normal appendix.

Vascular/Lymphatic: No significant atherosclerotic disease, aneurysm
or dissection noted in the abdominal or pelvic vasculature. Interval
enlargement of multiple pelvic lymph nodes bilaterally
(left-greater-than-right), indicative of progression of metastatic
disease. Largest left-sided lymph nodes include a 1.6 cm short axis
left inguinal lymph node (axial image 102 of series [DATE] cm short
axis left external iliac lymph node (axial image 94 of series 2),
and 1.8 cm short axis left internal iliac lymph node (axial image 91
of series 2). The largest right-sided pelvic lymph node is along the
right pelvic sidewall between the right external iliac and internal
iliac nodal stations measuring 1.5 cm in short axis (axial image 90
of series 2). No definite abdominal lymphadenopathy.

Reproductive: Previously noted soft tissue mass extending from the
posterior aspect of the uterine fundus (axial image 91 of series 2
and coronal image 80 of series 4) currently measures 2.6 x 2.1 x
cm, slightly larger than the prior examination, again intimately
associated with the adjacent sigmoid colon and several distal small
bowel loops (likely ileum) which appear tethered to the lesion.

Other: No significant volume of ascites.  No pneumoperitoneum.

Musculoskeletal: Mixed lucency and sclerosis in the L3 vertebral
body with probable pathologic fractures of both the superior and
inferior endplates, slightly more conspicuous than the recent prior
examination. Increasingly conspicuous sclerotic lesion in the
posterior aspect of the L2 vertebral body measuring 8 mm (sagittal
image 90 of series 5), suspicious for a treated metastatic lesion.
IMPRESSION: 1. Today's study demonstrates definitive progression of disease, as
evidenced by enlarging mass associated with the posterior aspect of
the uterine fundus, which is intimately associated with loops of
small bowel and sigmoid colon, worsening pelvic lymphadenopathy,
possible adrenal metastasis, and multifocal osseous metastases, as
detailed above.
2. Additional incidental findings, as above.

## 2021-07-04 MED ORDER — SODIUM CHLORIDE (PF) 0.9 % IJ SOLN
INTRAMUSCULAR | Status: AC
  Filled 2021-07-04: qty 50

## 2021-07-04 MED ORDER — IOHEXOL 300 MG/ML  SOLN
75.0000 mL | Freq: Once | INTRAMUSCULAR | Status: AC | PRN
  Administered 2021-07-04: 75 mL via INTRAVENOUS

## 2021-07-06 ENCOUNTER — Other Ambulatory Visit: Payer: Self-pay

## 2021-07-06 ENCOUNTER — Encounter: Payer: Self-pay | Admitting: Hematology and Oncology

## 2021-07-06 ENCOUNTER — Inpatient Hospital Stay: Payer: Medicare Other | Admitting: Hematology and Oncology

## 2021-07-06 ENCOUNTER — Telehealth: Payer: Self-pay

## 2021-07-06 VITALS — BP 153/89 | HR 87 | Temp 98.6°F | Resp 18 | Ht 60.0 in | Wt 110.8 lb

## 2021-07-06 DIAGNOSIS — C549 Malignant neoplasm of corpus uteri, unspecified: Secondary | ICD-10-CM

## 2021-07-06 DIAGNOSIS — Z5111 Encounter for antineoplastic chemotherapy: Secondary | ICD-10-CM

## 2021-07-06 DIAGNOSIS — Z5112 Encounter for antineoplastic immunotherapy: Secondary | ICD-10-CM | POA: Diagnosis not present

## 2021-07-06 NOTE — Progress Notes (Signed)
[facility] OFFICE PROGRESS NOTE  Patient Care Team: [facility] [place] as PCP - General (Rheumatology) Jacqulyn Liner, RN as Oncology Nurse Navigator (Oncology)  ASSESSMENT & PLAN:  Uterine cancer [facility]) I have reviewed her recent blood work and imaging study The patient desire further palliative chemotherapy I reviewed the NCCN guidelines with the patient We discussed third line treatment options including liposomal doxorubicin, topotecan or Taxotere The risk, benefits, side effects of each treatment options were fully discussed with the patient and she choose to be treated with liposomal doxorubicin  The decision was made based on publication below and supported by NCCN guidelines  Phase II trial of the pegylated liposomal doxorubicin in previously treated metastatic endometrial cancer: a Gynecologic Oncology Group study Camille Bal 1, Henrene Dodge, Anette Riedel  PMID: [id number] DOI: [email or web address]/[id number].[email or web address]  Abstract Purpose: To determine whether pegylated liposomal doxorubicin (PLD) has antitumor activity in pretreated patients with persistent or recurrent endometrial carcinoma and to define the nature and degree of toxicity of PLD.  Patients and methods: Women with histologically documented recurrent or persistent measurable endometrial carcinoma and with failure of one prior treatment regardless of prior anthracycline therapy were enrolled. PLD was administered intravenously over a 1-hour period at a dose of 50 mg/m(2) every 4 weeks; the dosage was modified in accordance with observed toxicity.  Results: Of 46 patients entered, 42 were assessable for response, as three were declared ineligible on central pathology review and one was not assessable for response. 78 had received prior chemotherapy, 11 hormonal therapy, and 29 radiation therapy. Doxorubicin had been given to 32 patients, carboplatin with paclitaxel to six, carboplatin to  one, and fluorouracil to one. Four patients had partial responses lasting 1.1, 2.1, 3.3, and 5.4 months; the overall response rate was 9.5% (95% confidence interval, 2.7% to 22.6%). Three of these responses (in liver and in lymph node) occurred in patients who had progressed after doxorubicin with either paclitaxel or cisplatin. The median number of courses was 2.5 (range, one to 14). Toxicity was generally mild: only 25 patients experienced leukopenia, with a median WBC count of 2,900 (range, 800 to 3,900) at nadir. The only grade 4 toxicities were one episode each of esophagitis, hematuria, and vomiting. The median overall survival was 8.2 months.  Conclusion: PLD has only limited activity in pretreated advanced, recurrent endometrial cancer, but further trials in anthracycline-naive patients and in previously untreated patients are ongoing. Its toxicity profile should permit its use in combination with myelosuppressive drugs.  We discussed some of the risks, benefits and side-effects of  Doxil   Some of the short term side-effects included, though not limited to, risk of fatigue, weight loss, tumor lysis syndrome, risk of allergic reactions, pancytopenia, life-threatening infections, need for transfusions of blood products, nausea, vomiting, change in bowel habits, hair loss, risk of congestive heart failure, admission to hospital for various reasons, and risks of death.   Long term side-effects are also discussed including permanent damage to nerve function, chronic fatigue, and rare secondary malignancy including bone marrow disorders.   The patient is aware that the response rates discussed earlier is not guaranteed.    After a long discussion, patient made an informed decision to proceed with the prescribed plan of care.   Patient education material was dispensed  She will need baseline echocardiogram and blood work before we proceed with treatment I will see her before the start date of  treatment We discussed minimum 3 cycles of treatment  before repeating imaging study She is aware that treatment goal is palliative  Orders Placed This Encounter  Procedures   CBC with Differential ([place] Only)    Standing Status:   Standing    Number of Occurrences:   20    Standing Expiration Date:   07/06/2022   CMP ([place] only)    Standing Status:   Standing    Number of Occurrences:   20    Standing Expiration Date:   07/06/2022   ECHOCARDIOGRAM COMPLETE    Standing Status:   Future    Standing Expiration Date:   07/06/2022    Order Specific Question:   Where should this test be performed    Answer:   [place]    Order Specific Question:   Perflutren DEFINITY (image enhancing agent) should be administered unless hypersensitivity or allergy exist    Answer:   Administer Perflutren    Order Specific Question:   Reason for exam-Echo    Answer:   Chemo  Z09    All questions were answered. The patient knows to call the clinic with any problems, questions or concerns. The total time spent in the appointment was 40 minutes encounter with patients including review of chart and various tests results, discussions about plan of care and coordination of care plan   [name], MD 07/06/2021 4:25 PM  INTERVAL HISTORY: Please see below for problem oriented charting. she returns for treatment follow-up The patient had 1 episode of fall recently but did not have major injuries She denies seizures or headaches Denies abdominal pain or changes in bowel habits  REVIEW OF SYSTEMS:   Constitutional: Denies fevers, chills or abnormal weight loss Eyes: Denies blurriness of vision Ears, nose, mouth, throat, and face: Denies mucositis or sore throat Respiratory: Denies cough, dyspnea or wheezes Cardiovascular: Denies palpitation, chest discomfort or lower extremity swelling Gastrointestinal:  Denies nausea, heartburn or change in bowel habits Skin: Denies abnormal skin  rashes Lymphatics: Denies new lymphadenopathy or easy bruising Neurological:Denies numbness, tingling or new weaknesses Behavioral/Psych: Mood is stable, no new changes  All other systems were reviewed with the patient and are negative.  I have reviewed the past medical history, past surgical history, social history and family history with the patient and they are unchanged from previous note.  ALLERGIES:  has No Known Allergies.  MEDICATIONS:  Current Outpatient Medications  Medication Sig Dispense Refill   amLODipine (NORVASC) 10 MG tablet Take 1 tablet (10 mg total) by mouth daily. 90 tablet 2   atenolol (TENORMIN) 50 MG tablet TAKE 1 TABLET(50 MG) BY MOUTH DAILY (Patient taking differently: Take 50 mg by mouth daily.) 30 tablet 2   cholecalciferol (VITAMIN D3) 25 MCG (1000 UNIT) tablet Take 1,000 Units by mouth daily.     doxazosin (CARDURA) 4 MG tablet Take 1 tablet (4 mg total) by mouth daily. 30 tablet 3   lidocaine-prilocaine (EMLA) cream Apply 1 application topically daily as needed. 30 g 3   LORazepam (ATIVAN) 0.5 MG tablet TAKE 1 TABLET(0.5 MG) BY MOUTH TWICE DAILY AS NEEDED FOR ANXIETY (Patient taking differently: Take 0.5 mg by mouth 2 (two) times daily as needed for anxiety.) 60 tablet 0   MAGNESIUM-OXIDE 400 (240 Mg) MG tablet TAKE 1 TABLET(400 MG) BY MOUTH TWICE DAILY (Patient taking differently: Take 400 mg by mouth 2 (two) times daily.) 60 tablet 1   ondansetron (ZOFRAN) 8 MG tablet Take 1 tablet (8 mg total) by mouth every 8 (eight) hours  as needed for nausea. 30 tablet 3   prochlorperazine (COMPAZINE) 10 MG tablet Take 1 tablet (10 mg total) by mouth every 6 (six) hours as needed for nausea or vomiting. 30 tablet 1   No current facility-administered medications for this visit.    SUMMARY OF ONCOLOGIC HISTORY: Oncology History Overview Note  High grade serous MMR normal Her2 negative Progressed on carboplatin, taxol, pembrolizumab and lenvima   Uterine cancer ([place])   08/28/2020 Imaging   1. Pelvic mass measuring 7.5 x 4.8 cm, contiguous with the sigmoid colon and uterus with loss of fat planes. Site of origin is unclear, may be ovarian, uterine, or colonic. Lack of contrast limits detailed assessment. 2. Above pelvic mass causes partial obstruction of the left ureter with mild left hydroureteronephrosis. 3. Multifocal adenopathy in the abdomen and pelvis, suspicious for metastatic disease, primarily in the mesentery and lower peritoneum and pelvis. Please note that detailed assessment of adenopathy is limited on this noncontrast exam. Recommend oncology referral.  4. Wall thickening of the sigmoid colon distal to the left pelvic mass with pericolonic edema, suspicious for colitis. There is a moderate to large amount of stool in the more proximal colon, suggesting pelvic mass may be causing delayed colonic transit. 5. Small to moderate hiatal hernia.   08/28/2020 - 09/04/2020 Hospital Admission   The patient has been complaining of reduced urine output, loss of appetite and changes in bowel habits She had abnormal imaging study leading to hospitalization, colonoscopy, biopsy and subsequently chemotherapy   08/30/2020 Procedure   CT-guided biopsy of enlarged left external iliac chain lymph node as above   08/30/2020 Pathology Results   SURGICAL PATHOLOGY  CASE: [id number]  PATIENT: Teresa Oliver  Surgical Pathology Report  Clinical History: Pelvic mass with metastatic lymph adenopathy (crm)  FINAL MICROSCOPIC DIAGNOSIS:   A. LYMPH NODE, LEFT EXTERNAL ILIAC CHAIN, BIOPSY:  - Poorly differentiated carcinoma with necrosis.  - See comment.   COMMENT:  The carcinoma is characterized by diffuse sheets of malignant epithelioid cells with prominent nucleoli and frequent mitotic figures. Immunohistochemistry is positive with cytokeratin 7 and PAX 8 with weak  positivity with estrogen receptor and WT1.  P53 shows diffuse strong nuclear positivity.  The carcinoma  is negative with cytokeratin 20, CDX2, GATA3, GCDFP, TTF-1, HepPar 1, arginase 1 and CD10.  The  morphology and immunophenotype are most consistent with a gynecologic primary including high-grade serous carcinoma.    08/31/2020 Procedure   Colonoscopy - Preparation of the colon was fair. - Likely malignant partially obstructing tumor in the sigmoid colon (appears extrinsic process). Biopsied. Tattooed. - Internal hemorrhoids. - Stool in the entire examined colon   09/01/2020 Initial Diagnosis   Uterine cancer ([place])   09/01/2020 Procedure   Successful placement of a power injectable Port-A-Cath via the right internal jugular vein. The catheter is ready for immediate use.   09/04/2020 - 12/29/2020 Chemotherapy    Patient is on Treatment Plan: UTERINE CARBOPLATIN AUC 6 / PACLITAXEL Q21D       11/15/2020 Imaging   1. Marked interval response to therapy with decrease in size of the mass extending from the uterus to the colon that was seen on the previous examination. Also with decreased size and or resolution of pelvic, mesenteric and retroperitoneal adenopathy seen on previous imaging. 2. Tethering of the colon with area that raise the question of developing colovaginal fistula versus internal enhancement within the mass that is now decreased in size. Attention on follow-up is suggested. Correlate with any  current signs of vaginal drainage. 3. LEFT thoracic inlet adenopathy compatible with involvement in the chest. Area not imaged on previous imaging.   02/18/2021 Imaging   1. Interval progression of left supraclavicular,, left thoracic inlet, left subpectoral, retroperitoneal, mesenteric and pelvic sidewall lymphadenopathy. Metastatic deposits in the central pelvis are also progressive. 2. Interval progression of left subpectoral lymphadenopathy with thrombus now visible in the left subclavian/innominate vein. 3. Aortic Atherosclerosis (ICD10-I70.0).   02/23/2021 - 06/15/2021 Chemotherapy    Patient is on Treatment Plan : UTERINE Lenvatinib + Pembrolizumab q21d     05/23/2021 Imaging   1. Decreased size of pelvic soft tissue lesions. 2. Decreased size of left supraclavicular and left subpectoral lymph nodes. 3. Overall decreased size of abdominal and pelvic lymph nodes; however, a few left-sided pelvic and inguinal lymph nodes have increased in size. 4. New mild compression deformity of the inferior endplate of L3. 5.  Aortic Atherosclerosis (ICD10-I70.0).     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  Operating microscope.,  Microdissection technique. Surgeon: Barnett Abu First Assistant: Lisbeth Renshaw MD    06/01/2021 Pathology Results   SURGICAL PATHOLOGY  CASE: [phone]  PATIENT: Teresa Oliver  Surgical Pathology Report    FINAL MICROSCOPIC DIAGNOSIS:   A. BRAIN TUMOR, RIGHT CEREBELLAR, RESECTION:  Poorly differentiated carcinoma with morphology and immunoprofile consistent with metastatic previously diagnosed high-grade serous carcinoma.  Clinical correlation is required.   COMMENT:   Immunohistochemistry with appropriate controls for single antibody is positive for CK7, PAX8, WT1 and p53 and is negative for GFAP, CK20 and ER.    07/05/2021 Cancer Staging   Staging form: Corpus Uteri - Carcinoma and Carcinosarcoma, AJCC 8th Edition - Clinical stage from 07/05/2021: FIGO Stage IVB (rcT4, cN2a, pM1) - Signed by Artis Delay, MD on 07/05/2021 Stage prefix: Recurrence    07/05/2021 Imaging   1. Today's study demonstrates definitive progression of disease, as evidenced by enlarging mass associated with the posterior aspect of the uterine fundus, which is intimately associated with loops of small bowel and sigmoid colon, worsening pelvic lymphadenopathy,  possible adrenal metastasis, and multifocal osseous metastases, as detailed above. 2. Additional incidental findings, as above.       07/13/2021 -  Chemotherapy   Patient is on Treatment Plan : Uterine Liposomal Doxorubicin q28d       PHYSICAL EXAMINATION: ECOG PERFORMANCE STATUS: 1 - Symptomatic but completely ambulatory  Vitals:   07/06/21 1354  BP: (!) 153/89  Pulse: 87  Resp: 18  Temp: 98.6 F (37 C)  SpO2: 99%   Filed Weights   07/06/21 1354  Weight: 110 lb 12.8 oz (50.3 kg)    GENERAL:alert, no distress and comfortable  NEURO: alert & oriented x 3 with fluent speech, no focal motor/sensory deficits  LABORATORY DATA:  I have reviewed the data as listed    Component Value Date/Time   NA 137 06/22/2021 0856   K 4.2 06/22/2021 0856   CL 105 06/22/2021 0856   CO2 24 06/22/2021 0856   GLUCOSE 109 (H) 06/22/2021 0856   BUN 27 (H) 06/22/2021 0856   CREATININE 1.37 (H) 06/22/2021 0856   CALCIUM 9.5 06/22/2021 0856   PROT 7.0 06/22/2021 0856   ALBUMIN 3.7 06/22/2021 0856   AST 12 (L) 06/22/2021 0856   ALT 8 06/22/2021 0856   ALKPHOS 71 06/22/2021 0856  BILITOT 0.4 06/22/2021 0856   GFRNONAA 40 (L) 06/22/2021 0856    No results found for: SPEP, UPEP  Lab Results  Component Value Date   WBC 4.2 06/22/2021   NEUTROABS 2.4 06/22/2021   HGB 8.1 (L) 06/22/2021   HCT 25.9 (L) 06/22/2021   MCV 70.8 (L) 06/22/2021   PLT 251 06/22/2021      Chemistry      Component Value Date/Time   NA 137 06/22/2021 0856   K 4.2 06/22/2021 0856   CL 105 06/22/2021 0856   CO2 24 06/22/2021 0856   BUN 27 (H) 06/22/2021 0856   CREATININE 1.37 (H) 06/22/2021 0856      Component Value Date/Time   CALCIUM 9.5 06/22/2021 0856   ALKPHOS 71 06/22/2021 0856   AST 12 (L) 06/22/2021 0856   ALT 8 06/22/2021 0856   BILITOT 0.4 06/22/2021 0856       RADIOGRAPHIC STUDIES: I have reviewed multiple imaging studies with the patient I have personally reviewed the radiological  images as listed and agreed with the findings in the report. CT CHEST ABDOMEN PELVIS W CONTRAST  Result Date: 07/05/2021 CLINICAL DATA:  76 year old female with history of uterine/cervical cancer diagnosed in 2022 status post chemotherapy which is complete, as well as radiation therapy to the brain which is now complete. Follow-up study. EXAM: CT CHEST, ABDOMEN, AND PELVIS WITH CONTRAST TECHNIQUE: Multidetector CT imaging of the chest, abdomen and pelvis was performed following the standard protocol during bolus administration of intravenous contrast. RADIATION DOSE REDUCTION: This exam was performed according to the departmental dose-optimization program which includes automated exposure control, adjustment of the mA and/or kV according to patient size and/or use of iterative reconstruction technique. CONTRAST:  85mL OMNIPAQUE IOHEXOL 300 MG/ML  SOLN COMPARISON:  CT the chest, abdomen and pelvis 05/24/2021. FINDINGS: CT CHEST FINDINGS Cardiovascular: Heart size is normal. There is no significant pericardial fluid, thickening or pericardial calcification. No atherosclerotic calcifications are noted in the thoracic aorta or the coronary arteries. Right internal jugular single-lumen porta cath with tip terminating at the superior cavoatrial junction. Mediastinum/Nodes: No pathologically enlarged mediastinal, hilar or internal mammary lymph nodes. Small hiatal hernia. No axillary or subpectoral lymphadenopathy. Lungs/Pleura: No suspicious appearing pulmonary nodules or masses are noted. No acute consolidative airspace disease. No pleural effusions. Areas of mild linear scarring are noted in the lung bases bilaterally. Musculoskeletal: Sclerotic lesion in the sternum (sagittal image 90 of series 5), new compared to prior examinations, concerning for probable metastatic lesion. Additional small sclerotic lesion in the posterior aspect of the inferior endplate of T1 (sagittal image 96 of series 5), suspicious for an  additional metastatic lesion. Compression fracture of T5 with 25% loss of anterior vertebral body height, unchanged. CT ABDOMEN PELVIS FINDINGS Hepatobiliary: No suspicious cystic or solid hepatic lesions. No intra or extrahepatic biliary ductal dilatation. Gallbladder is normal in appearance. Pancreas: No pancreatic mass. No pancreatic ductal dilatation. No pancreatic or peripancreatic fluid collections or inflammatory changes. Spleen: Unremarkable. Adrenals/Urinary Tract: New nodularity in the right adrenal gland concerning for potential metastatic lesion (axial image 48 of series 2) measuring 1.5 x 1.3 cm. Bilateral kidneys and left adrenal gland are normal in appearance. No hydroureteronephrosis. Urinary bladder is normal in appearance. Stomach/Bowel: The appearance of the stomach is normal. There is no pathologic dilatation of small bowel or colon. Normal appendix. Vascular/Lymphatic: No significant atherosclerotic disease, aneurysm or dissection noted in the abdominal or pelvic vasculature. Interval enlargement of multiple pelvic lymph nodes bilaterally (left-greater-than-right), indicative of  progression of metastatic disease. Largest left-sided lymph nodes include a 1.6 cm short axis left inguinal lymph node (axial image 102 of series 2) 2.2 cm short axis left external iliac lymph node (axial image 94 of series 2), and 1.8 cm short axis left internal iliac lymph node (axial image 91 of series 2). The largest right-sided pelvic lymph node is along the right pelvic sidewall between the right external iliac and internal iliac nodal stations measuring 1.5 cm in short axis (axial image 90 of series 2). No definite abdominal lymphadenopathy. Reproductive: Previously noted soft tissue mass extending from the posterior aspect of the uterine fundus (axial image 91 of series 2 and coronal image 80 of series 4) currently measures 2.6 x 2.1 x 2.0 cm, slightly larger than the prior examination, again intimately  associated with the adjacent sigmoid colon and several distal small bowel loops (likely ileum) which appear tethered to the lesion. Other: No significant volume of ascites.  No pneumoperitoneum. Musculoskeletal: Mixed lucency and sclerosis in the L3 vertebral body with probable pathologic fractures of both the superior and inferior endplates, slightly more conspicuous than the recent prior examination. Increasingly conspicuous sclerotic lesion in the posterior aspect of the L2 vertebral body measuring 8 mm (sagittal image 90 of series 5), suspicious for a treated metastatic lesion. IMPRESSION: 1. Today's study demonstrates definitive progression of disease, as evidenced by enlarging mass associated with the posterior aspect of the uterine fundus, which is intimately associated with loops of small bowel and sigmoid colon, worsening pelvic lymphadenopathy, possible adrenal metastasis, and multifocal osseous metastases, as detailed above. 2. Additional incidental findings, as above. Electronically Signed   By: Vinnie Langton M.D.   On: 07/05/2021 06:18

## 2021-07-06 NOTE — Telephone Encounter (Signed)
Called and given appt for echo at [place] location [place] on 2/23 at 2 pm, instructed to arrive at 1:45 pm. She verbalized understanding.

## 2021-07-06 NOTE — Progress Notes (Signed)
DISCONTINUE ON PATHWAY REGIMEN - Uterine     A cycle is every 21 days:     Lenvatinib      Pembrolizumab   **Always confirm dose/schedule in your pharmacy ordering system**  REASON: Disease Progression PRIOR TREATMENT: [id number]: Lenvatinib 20 mg Daily + Pembrolizumab 200 mg q21 Days Until Progression or Unacceptable Toxicity or up to 24 Months TREATMENT RESPONSE: Progressive Disease (PD)  START OFF PATHWAY REGIMEN - Uterine   [id number]:Liposomal Doxorubicin (Doxil) 40 mg/m2 q28 Days:   A cycle is every 28 days:     Liposomal doxorubicin   **Always confirm dose/schedule in your pharmacy ordering system**  Patient Characteristics: Serous Carcinoma, Recurrent/Progressive Disease, Third Line and Beyond, HER2 Negative/Unknown Histology: Serous Carcinoma Therapeutic Status: Recurrent or Progressive Disease Line of Therapy: Third Line and Beyond HER2 Status: Negative Intent of Therapy: Non-Curative / Palliative Intent, Discussed with Patient

## 2021-07-06 NOTE — Assessment & Plan Note (Signed)
I have reviewed her recent blood work and imaging study The patient desire further palliative chemotherapy I reviewed the NCCN guidelines with the patient We discussed third line treatment options including liposomal doxorubicin, topotecan or Taxotere The risk, benefits, side effects of each treatment options were fully discussed with the patient and she choose to be treated with liposomal doxorubicin  The decision was made based on publication below and supported by NCCN guidelines  Phase II trial of the pegylated liposomal doxorubicin in previously treated metastatic endometrial cancer: a Gynec[facility] Oncology Group study Camille Bal 1, Henrene Dodge, Anette Riedel  PMID: [id number] DOI: 10.1200/JCO.2002.08.171  Abstract Purpose: To determine whether pegylated liposomal doxorubicin (PLD) has antitumor activity in pretreated patients with persistent or recurrent endometrial carcinoma and to define the nature and degree of toxicity of PLD.  Patients and methods: Women with histologically documented recurrent or persistent measurable endometrial carcinoma and with failure of one prior treatment regardless of prior anthracycline therapy were enrolled. PLD was administered intravenously over a 1-hour period at a dose of 50 mg/m(2) every 4 weeks; the dosage was modified in accordance with observed toxicity.  Results: Of 46 patients entered, 42 were assessable for response, as three were declared ineligible on central pathology review and one was not assessable for response. 31 had received prior chemotherapy, 11 hormonal therapy, and 29 radiation therapy. Doxorubicin had been given to 32 patients, carboplatin with paclitaxel to six, carboplatin to one, and fluorouracil to one. Four patients had partial responses lasting 1.1, 2.1, 3.3, and 5.4 months; the overall response rate was 9.5% (95% confidence interval, 2.7% to 22.6%). Three of these responses (in liver and in lymph node)  occurred in patients who had progressed after doxorubicin with either paclitaxel or cisplatin. The median number of courses was 2.5 (range, one to 14). Toxicity was generally mild: only 25 patients experienced leukopenia, with a median WBC count of 2,900 (range, 800 to 3,900) at nadir. The only grade 4 toxicities were one episode each of esophagitis, hematuria, and vomiting. The median overall survival was 8.2 months.  Conclusion: PLD has only limited activity in pretreated advanced, recurrent endometrial cancer, but further trials in anthracycline-naive patients and in previously untreated patients are ongoing. Its toxicity profile should permit its use in combination with myelosuppressive drugs.  We discussed some of the risks, benefits and side-effects of  Doxil   Some of the short term side-effects included, though not limited to, risk of fatigue, weight loss, tumor lysis syndrome, risk of allergic reactions, pancytopenia, life-threatening infections, need for transfusions of blood products, nausea, vomiting, change in bowel habits, hair loss, risk of congestive heart failure, admission to hospital for various reasons, and risks of death.   Long term side-effects are also discussed including permanent damage to nerve function, chronic fatigue, and rare secondary malignancy including bone marrow disorders.   The patient is aware that the response rates discussed earlier is not guaranteed.    After a long discussion, patient made an informed decision to proceed with the prescribed plan of care.   Patient education material was dispensed  She will need baseline echocardiogram and blood work before we proceed with treatment I will see her before the start date of treatment We discussed minimum 3 cycles of treatment before repeating imaging study She is aware that treatment goal is palliative

## 2021-07-06 NOTE — Telephone Encounter (Signed)
-----   Message from [name], MD sent at 07/06/2021  2:09 PM EST ----- Need echo next week before chemo on 2/27, please schedule, thanks

## 2021-07-09 NOTE — Progress Notes (Signed)
Pharmacist Chemotherapy Monitoring - Initial Assessment    Anticipated start date: 07/09/21   The following has been reviewed per standard work regarding the patient's treatment regimen: The patient's diagnosis, treatment plan and drug doses, and organ/hematologic function Lab orders and baseline tests specific to treatment regimen  The treatment plan start date, drug sequencing, and pre-medications Prior authorization status  Patient's documented medication list, including drug-drug interaction screen and prescriptions for anti-emetics and supportive care specific to the treatment regimen The drug concentrations, fluid compatibility, administration routes, and timing of the medications to be used The patient's access for treatment and lifetime cumulative dose history, if applicable  The patient's medication allergies and previous infusion related reactions, if applicable   Changes made to treatment plan:  N/A  Follow up needed:  Pending authorization for treatment  and ECHO scheduled for 2/23 - f/u results  Teresa Oliver, PharmD Pharmacy Resident  07/09/2021 1:56 PM

## 2021-07-11 ENCOUNTER — Other Ambulatory Visit: Payer: Medicare Other

## 2021-07-11 ENCOUNTER — Encounter: Payer: Self-pay | Admitting: Hematology and Oncology

## 2021-07-11 ENCOUNTER — Ambulatory Visit
Admission: RE | Admit: 2021-07-11 | Discharge: 2021-07-11 | Disposition: A | Discharge status: Home / Self Care | Payer: Medicare Other | Source: Ambulatory Visit | Attending: Neurological Surgery | Admitting: Neurological Surgery

## 2021-07-11 ENCOUNTER — Other Ambulatory Visit: Payer: Self-pay

## 2021-07-11 DIAGNOSIS — C7931 Secondary malignant neoplasm of brain: Secondary | ICD-10-CM

## 2021-07-11 IMAGING — MR MR HEAD WO/W CM
13 series · 48 of 48 positions shown · IV contrast (multihance)
Comparison: MRI of the brain [DATE].

CLINICAL DATA: Malignant neoplasm metastatic to brain (HCC) [5H]
([5H]-CM).

EXAM:
MRI HEAD WITHOUT AND WITH CONTRAST
TECHNIQUE: Multiplanar, multiecho pulse sequences of the brain and surrounding
structures were obtained without and with intravenous contrast.
CONTRAST:  10mL MULTIHANCE GADOBENATE DIMEGLUMINE 529 MG/ML IV SOLN

[Series 3: FLAIR · sagittal · 3.0mm · 0.75mm/px · 2 of 39 slices shown (1 of 2)]
[im 1/39]
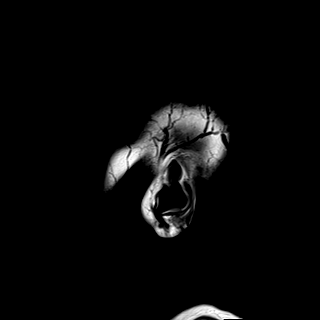
[im 39/39]
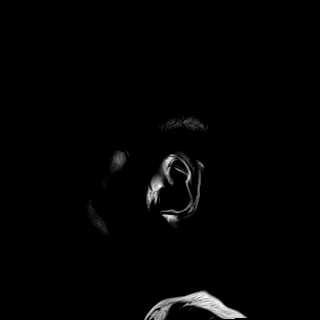

[Series 4: T2 · axial · 5.0mm · 0.57mm/px · 1 of 27 slices shown (1 of 2)]
[im 1/27]
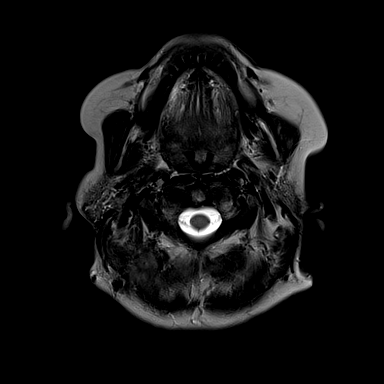

[Series 5: DWI · axial · 3.0mm · 1.50mm/px · z∈[-103,+45]mm · 4 of 78 slices shown (1 of 2)]
[im 1/78]
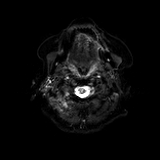
[im 26/78]
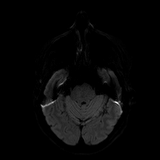
[im 52/78]
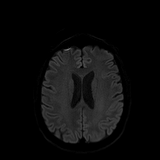
[im 78/78]
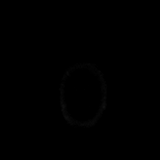

[Series 6: DWI · axial · 3.0mm · 1.50mm/px · z∈[-103,+45]mm · 2 of 39 slices shown (2 of 2)]
[im 1/39]
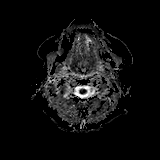
[im 39/39]
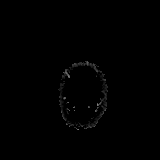

[Series 7: swi_images · axial · 1.5mm · 0.90mm/px · z∈[-106,+48]mm · 5 of 104 slices shown]
[im 1/104]
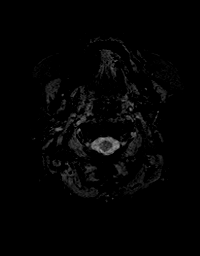
[im 26/104]
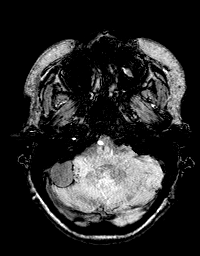
[im 52/104]
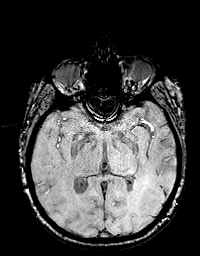
[im 78/104]
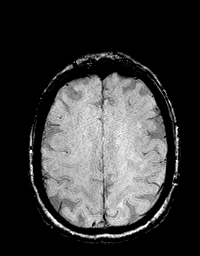
[im 104/104]
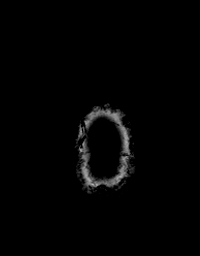

[Series 8: mip_images(sw) · axial · 12.0mm · 0.90mm/px · z∈[-101,+43]mm · 4 of 97 slices shown]
[im 1/97]
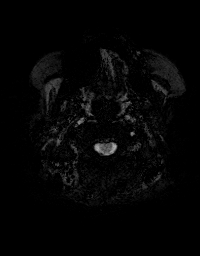
[im 33/97]
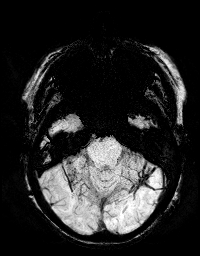
[im 65/97]
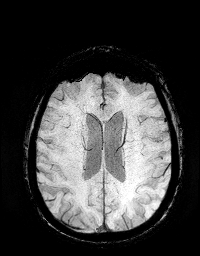
[im 97/97]
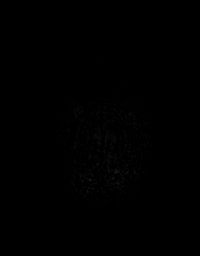

[Series 9: FLAIR · axial · 3.0mm · 0.86mm/px · z∈[-123,+63]mm · 3 of 62 slices shown (2 of 2)]
[im 1/62]
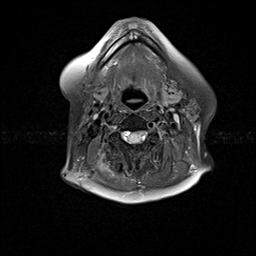
[im 31/62]
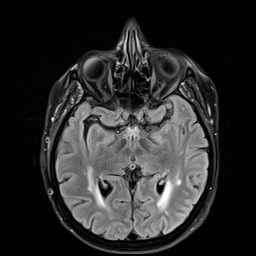
[im 62/62]
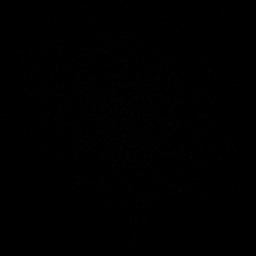

[Series 10: T2 · axial · non-contrast · 1.0mm · 0.86mm/px · z∈[-105,+51]mm · 7 of 160 slices shown (2 of 2)]
[im 1/160]
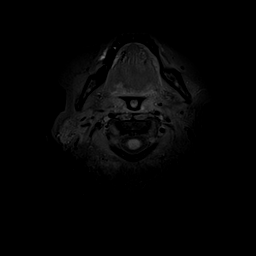
[im 27/160]
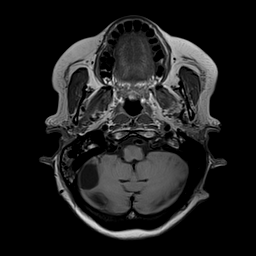
[im 54/160]
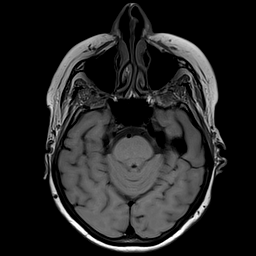
[im 80/160]
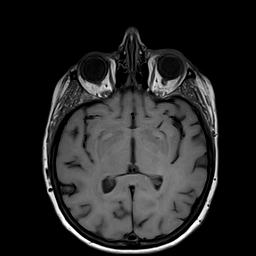
[im 107/160]
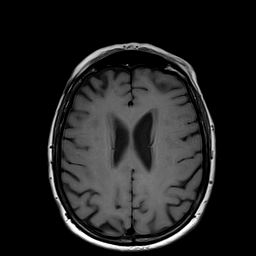
[im 133/160]
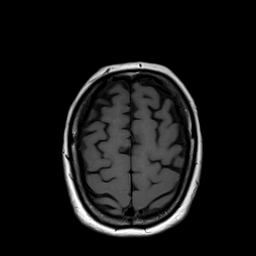
[im 160/160]
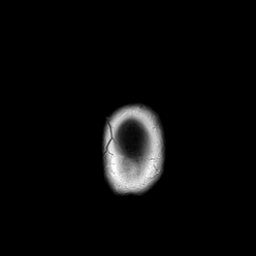

[Series 11: T2 post-contrast · coronal · 3.0mm · 0.57mm/px · 2 of 44 slices shown (1 of 2)]
[im 1/44]
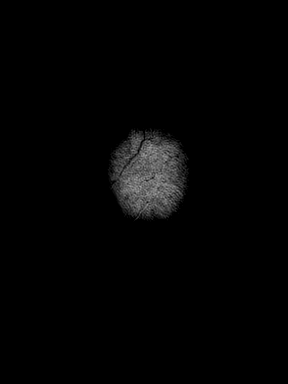
[im 44/44]
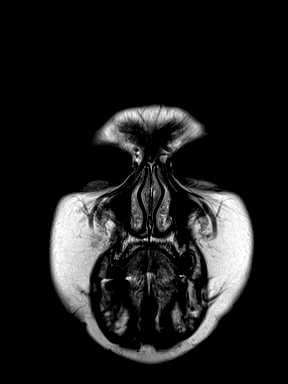

[Series 12: T2 post-contrast · axial · 1.0mm · 0.86mm/px · z∈[-105,+51]mm · 7 of 160 slices shown (2 of 2)]
[im 1/160]
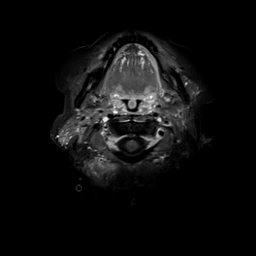
[im 27/160]
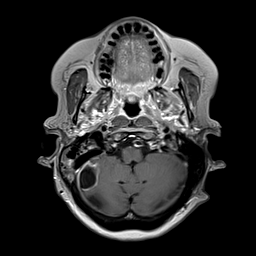
[im 54/160]
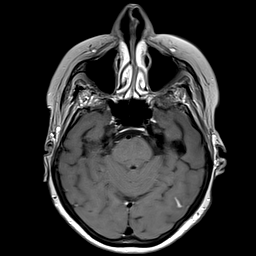
[im 80/160]
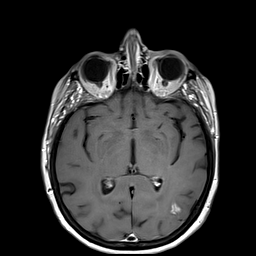
[im 107/160]
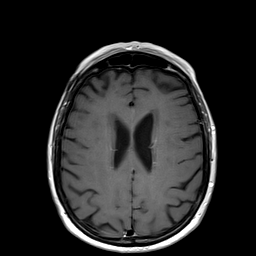
[im 133/160]
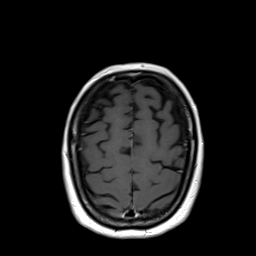
[im 160/160]
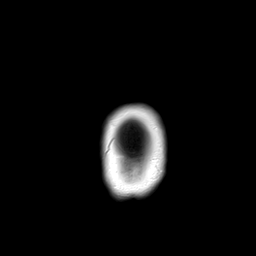

[Series 13: T1 post-contrast · axial · 1.0mm · 0.75mm/px · z∈[-107,+52]mm · 7 of 160 slices shown (1 of 2)]
[im 1/160]
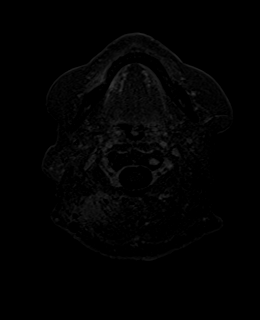
[im 27/160]
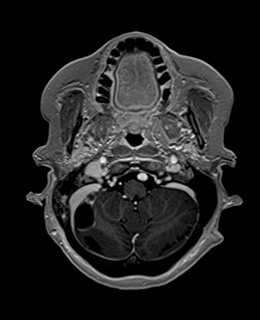
[im 54/160]
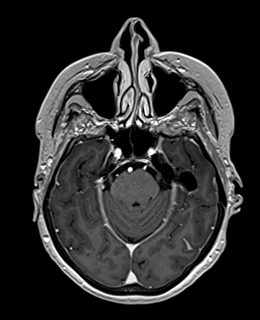
[im 80/160]
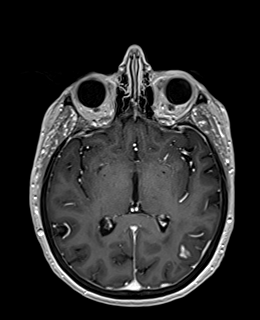
[im 107/160]
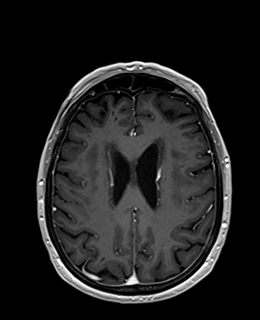
[im 133/160]
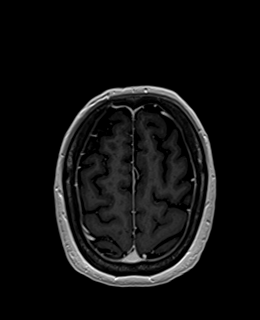
[im 160/160]
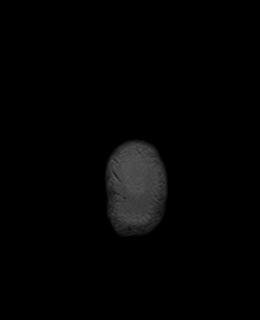

[Series 14: T1 post-contrast · coronal · 3.0mm · 0.57mm/px · 2 of 45 slices shown (2 of 2)]
[im 1/45]
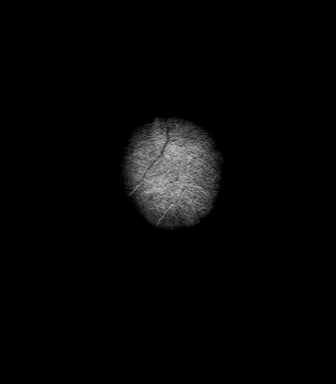
[im 45/45]
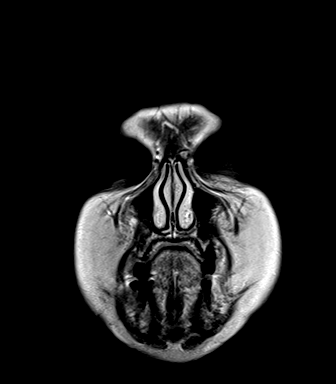

[Series 15: FLAIR post-contrast · sagittal · 3.0mm · 0.75mm/px · 2 of 39 slices shown]
[im 1/39]
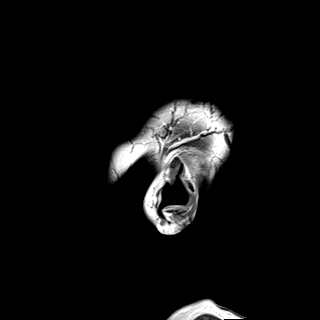
[im 39/39]
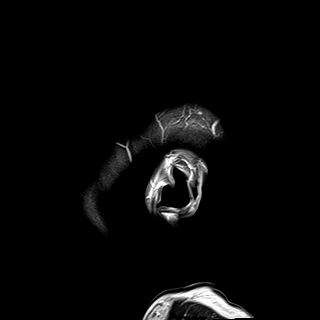

[48 of 48 positions shown; findings below may reference images not displayed]

FINDINGS: Brain: Postsurgical changes from right posterior fossa tumor
resection with surgical cavity in the right cerebellar hemisphere
with significant interval decrease of the edema and mass effect. New
nodular focus of contrast enhancement at the surgical cavity margins
measuring approximately 11 x 8 mm.

There is interval decrease in size of the previously described left
frontal and parietal lesions, now measuring 3 mm and 6 mm,
respectively mild the punctate focus of contrast enhancement in the
inferior left cerebellar hemisphere is no longer seen. However,
multiple in new foci of leptomeningeal contrast enhancement are seen
scattered along the cerebral sulci (series 12, images 46, 50, 67,
70, 80, 93, 98, 105, 114, 116, 125). A new lesion is also seen in
the inferior right cerebellar hemisphere (series 12, image 23)
measuring approximately 5 mm.

Remote lacunar infarct in the left cerebellar hemisphere. Scattered
and confluent foci of T2 hyperintensity within the white matter of
the cerebral hemispheres and within the pons remains unchanged.

Vascular: Normal flow voids.

Skull and upper cervical spine: Postsurgical changes from right
suboccipital craniectomy.

Sinuses/Orbits: Bilateral lens surgery. Mucosal thickening with
mucous retention cysts in the bilateral maxillary sinuses.

Other: Right mastoid effusion.
IMPRESSION: 1. Postsurgical changes from right posterior fossa tumor resection
with new focus of contrast enhancement at the right cerebellar
hemisphere surgical cavity margins, concerning for recurrence.
2. Multiple new foci of enhancement along the cerebral sulci
concerning for leptomeningeal spread of disease.
3. A 5 mm new lesion in the right cerebellar hemisphere.
4. Decrease in size of the previously described left frontal and
parietal lesions with resolution of punctate contrast enhancement in
the left cerebellar hemisphere.

These results will be called to the ordering clinician or
representative by the Radiologist Assistant, and communication
documented in the PACS or [REDACTED].

## 2021-07-11 MED ORDER — GADOBENATE DIMEGLUMINE 529 MG/ML IV SOLN
10.0000 mL | Freq: Once | INTRAVENOUS | Status: AC | PRN
  Administered 2021-07-11: 10 mL via INTRAVENOUS

## 2021-07-11 NOTE — Progress Notes (Signed)
°  Radiation Oncology         [phone] ________________________________  Name: Teresa Oliver MRN: [id number]  Date: 05/31/2021  DOB: 28-Feb-1946  End of Treatment Note  Diagnosis:   76 y.o. female with five brain metastases secondary to metastatic uterine cancer     Indication for treatment:  Palliation       Radiation treatment dates:   05/31/21  Site/dose/beams/energy:   [name] received stereotactic radiosurgery to the following five targets:   These targets were treated using single isocenter with 6 Rapid Arc VMAT Beams to a prescription dose of 15 Gy for PTV1 and 20 Gy for PTVs 2-5.  ExacTrac registration was performed for each couch angle.  The 100% isodose line was prescribed.  6 MV X-rays were delivered in the flattening filter free beam mode.  Narrative: The patient tolerated radiation treatment relatively well.     Plan: The patient has completed radiation treatment. The patient will return to radiation oncology clinic for routine followup in one month. I advised her to call or return sooner if she has any questions or concerns related to her recovery or treatment. ________________________________  Sheral Apley. Tammi Klippel, M.D.

## 2021-07-12 ENCOUNTER — Ambulatory Visit (INDEPENDENT_AMBULATORY_CARE_PROVIDER_SITE_OTHER): Payer: Medicare Other

## 2021-07-12 ENCOUNTER — Other Ambulatory Visit: Payer: Self-pay | Admitting: Hematology and Oncology

## 2021-07-12 DIAGNOSIS — Z9221 Personal history of antineoplastic chemotherapy: Secondary | ICD-10-CM | POA: Diagnosis not present

## 2021-07-12 DIAGNOSIS — I350 Nonrheumatic aortic (valve) stenosis: Secondary | ICD-10-CM | POA: Diagnosis not present

## 2021-07-12 DIAGNOSIS — C549 Malignant neoplasm of corpus uteri, unspecified: Secondary | ICD-10-CM | POA: Diagnosis not present

## 2021-07-12 DIAGNOSIS — Z5111 Encounter for antineoplastic chemotherapy: Secondary | ICD-10-CM

## 2021-07-12 LAB — ECHOCARDIOGRAM COMPLETE
AR max vel: 1.94 cm2
AV Area VTI: 1.66 cm2
AV Area mean vel: 1.72 cm2
AV Mean grad: 5 mmHg
AV Peak grad: 9.5 mmHg
Ao pk vel: 1.54 m/s
Area-P 1/2: 2.31 cm2
Calc EF: 60.5 %
S' Lateral: 2.86 cm
Single Plane A2C EF: 55.9 %
Single Plane A4C EF: 64.6 %

## 2021-07-13 MED FILL — Dexamethasone Sodium Phosphate Inj 100 MG/10ML: INTRAMUSCULAR | Qty: 1 | Status: AC

## 2021-07-16 ENCOUNTER — Inpatient Hospital Stay: Payer: Medicare Other

## 2021-07-16 ENCOUNTER — Encounter: Payer: Self-pay | Admitting: Radiation Oncology

## 2021-07-16 ENCOUNTER — Other Ambulatory Visit: Payer: Self-pay

## 2021-07-16 ENCOUNTER — Inpatient Hospital Stay: Payer: Medicare Other | Admitting: Hematology and Oncology

## 2021-07-16 VITALS — BP 160/80 | HR 86 | Temp 97.7°F | Resp 18 | Ht 60.0 in | Wt 110.6 lb

## 2021-07-16 DIAGNOSIS — D539 Nutritional anemia, unspecified: Secondary | ICD-10-CM | POA: Diagnosis not present

## 2021-07-16 DIAGNOSIS — N183 Chronic kidney disease, stage 3 unspecified: Secondary | ICD-10-CM | POA: Diagnosis not present

## 2021-07-16 DIAGNOSIS — C549 Malignant neoplasm of corpus uteri, unspecified: Secondary | ICD-10-CM

## 2021-07-16 DIAGNOSIS — I10 Essential (primary) hypertension: Secondary | ICD-10-CM | POA: Diagnosis not present

## 2021-07-16 DIAGNOSIS — Z5112 Encounter for antineoplastic immunotherapy: Secondary | ICD-10-CM | POA: Diagnosis not present

## 2021-07-16 LAB — CBC WITH DIFFERENTIAL (CANCER CENTER ONLY)
Abs Immature Granulocytes: 0.06 10*3/uL (ref 0.00–0.07)
Basophils Absolute: 0 10*3/uL (ref 0.0–0.1)
Basophils Relative: 1 %
Eosinophils Absolute: 0.2 10*3/uL (ref 0.0–0.5)
Eosinophils Relative: 4 %
HCT: 29.3 % — ABNORMAL LOW (ref 36.0–46.0)
Hemoglobin: 9.4 g/dL — ABNORMAL LOW (ref 12.0–15.0)
Immature Granulocytes: 1 %
Lymphocytes Relative: 26 %
Lymphs Abs: 1.4 10*3/uL (ref 0.7–4.0)
MCH: 22.4 pg — ABNORMAL LOW (ref 26.0–34.0)
MCHC: 32.1 g/dL (ref 30.0–36.0)
MCV: 69.9 fL — ABNORMAL LOW (ref 80.0–100.0)
Monocytes Absolute: 0.6 10*3/uL (ref 0.1–1.0)
Monocytes Relative: 12 %
Neutro Abs: 3 10*3/uL (ref 1.7–7.7)
Neutrophils Relative %: 56 %
Platelet Count: 229 10*3/uL (ref 150–400)
RBC: 4.19 MIL/uL (ref 3.87–5.11)
RDW: 17.3 % — ABNORMAL HIGH (ref 11.5–15.5)
WBC Count: 5.3 10*3/uL (ref 4.0–10.5)
nRBC: 0 % (ref 0.0–0.2)

## 2021-07-16 LAB — TOTAL PROTEIN, URINE DIPSTICK: Protein, ur: 100 mg/dL — AB

## 2021-07-16 LAB — CMP (CANCER CENTER ONLY)
ALT: 8 U/L (ref 0–44)
AST: 15 U/L (ref 15–41)
Albumin: 4.2 g/dL (ref 3.5–5.0)
Alkaline Phosphatase: 59 U/L (ref 38–126)
Anion gap: 8 (ref 5–15)
BUN: 30 mg/dL — ABNORMAL HIGH (ref 8–23)
CO2: 25 mmol/L (ref 22–32)
Calcium: 9.8 mg/dL (ref 8.9–10.3)
Chloride: 105 mmol/L (ref 98–111)
Creatinine: 1.28 mg/dL — ABNORMAL HIGH (ref 0.44–1.00)
GFR, Estimated: 44 mL/min — ABNORMAL LOW (ref >60)
Glucose, Bld: 107 mg/dL — ABNORMAL HIGH (ref 70–99)
Potassium: 3.8 mmol/L (ref 3.5–5.1)
Sodium: 138 mmol/L (ref 135–145)
Total Bilirubin: 0.4 mg/dL (ref 0.3–1.2)
Total Protein: 7.4 g/dL (ref 6.5–8.1)

## 2021-07-16 LAB — TSH: TSH: 2.196 u[IU]/mL (ref 0.308–3.960)

## 2021-07-16 MED ORDER — SODIUM CHLORIDE 0.9% FLUSH
10.0000 mL | INTRAVENOUS | Status: DC | PRN
  Administered 2021-07-16: 10 mL

## 2021-07-16 MED ORDER — SODIUM CHLORIDE 0.9% FLUSH
10.0000 mL | Freq: Once | INTRAVENOUS | Status: AC
  Administered 2021-07-16: 10 mL

## 2021-07-16 MED ORDER — DOXORUBICIN HCL LIPOSOMAL CHEMO INJECTION 2 MG/ML
40.5000 mg/m2 | Freq: Once | INTRAVENOUS | Status: AC
  Administered 2021-07-16: 60 mg via INTRAVENOUS
  Filled 2021-07-16: qty 30

## 2021-07-16 MED ORDER — SODIUM CHLORIDE 0.9 % IV SOLN
10.0000 mg | Freq: Once | INTRAVENOUS | Status: AC
  Administered 2021-07-16: 10 mg via INTRAVENOUS
  Filled 2021-07-16: qty 10

## 2021-07-16 MED ORDER — DEXTROSE 5 % IV SOLN: Freq: Once | INTRAVENOUS | Status: AC

## 2021-07-16 MED ORDER — HEPARIN SOD (PORK) LOCK FLUSH 100 UNIT/ML IV SOLN
500.0000 [IU] | Freq: Once | INTRAVENOUS | Status: AC | PRN
  Administered 2021-07-16: 500 [IU]

## 2021-07-16 NOTE — Patient Instructions (Signed)
[facility]  Discharge Instructions: °Thank you for choosing [facility] to provide your oncology and hematology care.  ° °If you have a lab appointment with the Cancer Center, please go directly to the Cancer Center and check in at the registration area. °  °Wear comfortable clothing and clothing appropriate for easy access to any Portacath or PICC line.  ° °We strive to give you quality time with your provider. You may need to reschedule your appointment if you arrive late (15 or more minutes).  Arriving late affects you and other patients whose appointments are after yours.  Also, if you miss three or more appointments without notifying the office, you may be dismissed from the clinic at the provider’s discretion.    °  °For prescription refill requests, have your pharmacy contact our office and allow 72 hours for refills to be completed.   ° °Today you received the following chemotherapy and/or immunotherapy agents: Doxil    °  °To help prevent nausea and vomiting after your treatment, we encourage you to take your nausea medication as directed. ° °BELOW ARE SYMPTOMS THAT SHOULD BE REPORTED IMMEDIATELY: °*FEVER GREATER THAN 100.4 F (38 °C) OR HIGHER °*CHILLS OR SWEATING °*NAUSEA AND VOMITING THAT IS NOT CONTROLLED WITH YOUR NAUSEA MEDICATION °*UNUSUAL SHORTNESS OF BREATH °*UNUSUAL BRUISING OR BLEEDING °*URINARY PROBLEMS (pain or burning when urinating, or frequent urination) °*BOWEL PROBLEMS (unusual diarrhea, constipation, pain near the anus) °TENDERNESS IN MOUTH AND THROAT WITH OR WITHOUT PRESENCE OF ULCERS (sore throat, sores in mouth, or a toothache) °UNUSUAL RASH, SWELLING OR PAIN  °UNUSUAL VAGINAL DISCHARGE OR ITCHING  ° °Items with * indicate a potential emergency and should be followed up as soon as possible or go to the Emergency Department if any problems should occur. ° °Please show the CHEMOTHERAPY ALERT CARD or IMMUNOTHERAPY ALERT CARD at check-in to the  Emergency Department and triage nurse. ° °Should you have questions after your visit or need to cancel or reschedule your appointment, please contact [facility]  Dept: [phone]  and follow the prompts.  Office hours are 8:00 a.m. to 4:30 p.m. Monday - Friday. Please note that voicemails left after 4:00 p.m. may not be returned until the following business day.  We are closed weekends and major holidays. You have access to a nurse at all times for urgent questions. Please call the main number to the clinic Dept: [phone] and follow the prompts. ° ° °For any non-urgent questions, you may also contact your provider using MyChart. We now offer e-Visits for anyone 18 and older to request care online for non-urgent symptoms. For details visit [email or web address]. °  °Also download the MyChart app! Go to the app store, search "MyChart", open the app, select [facility], and log in with your MyChart username and password. ° °Due to Covid, a mask is required upon entering the hospital/clinic. If you do not have a mask, one will be given to you upon arrival. For doctor visits, patients may have 1 support person aged 18 or older with them. For treatment visits, patients cannot have anyone with them due to current Covid guidelines and our immunocompromised population.  ° °Doxorubicin Liposomal injection °What is this medication? °LIPOSOMAL DOXORUBICIN (LIP oh som al  dox oh ROO bi sin) is a chemotherapy drug. This medicine is used to treat many kinds of cancer like Kaposi's sarcoma, multiple myeloma, and ovarian cancer. °This medicine may be used for other purposes;   ask your health care provider or pharmacist if you have questions. °COMMON BRAND NAME(S): Doxil, Lipodox °What should I tell my care team before I take this medication? °They need to know if you have any of these conditions: °blood disorders °heart disease °infection (especially a virus infection such as chickenpox, cold  sores, or herpes) °liver disease °recent or ongoing radiation therapy °an unusual or allergic reaction to doxorubicin, other chemotherapy agents, soybeans, other medicines, foods, dyes, or preservatives °pregnant or trying to get pregnant °breast-feeding °How should I use this medication? °This drug is given as an infusion into a vein. It is administered in a hospital or clinic by a specially trained health care professional. If you have pain, swelling, burning or any unusual feeling around the site of your injection, tell your health care professional right away. °Talk to your pediatrician regarding the use of this medicine in children. Special care may be needed. °Overdosage: If you think you have taken too much of this medicine contact a poison control center or emergency room at once. °NOTE: This medicine is only for you. Do not share this medicine with others. °What if I miss a dose? °It is important not to miss your dose. Call your doctor or health care professional if you are unable to keep an appointment. °What may interact with this medication? °Do not take this medicine with any of the following medications: °zidovudine °This medicine may also interact with the following medications: °medicines to increase blood counts like filgrastim, pegfilgrastim, sargramostim °vaccines °Talk to your doctor or health care professional before taking any of these medicines: °acetaminophen °aspirin °ibuprofen °ketoprofen °naproxen °This list may not describe all possible interactions. Give your health care provider a list of all the medicines, herbs, non-prescription drugs, or dietary supplements you use. Also tell them if you smoke, drink alcohol, or use illegal drugs. Some items may interact with your medicine. °What should I watch for while using this medication? °Your condition will be monitored carefully while you are receiving this medicine. You may need blood work done while you are taking this medicine. °This drug  may make you feel generally unwell. This is not uncommon, as chemotherapy can affect healthy cells as well as cancer cells. Report any side effects. Continue your course of treatment even though you feel ill unless your doctor tells you to stop. °Your urine may turn orange-red for a few days after your dose. This is not blood. If your urine is dark or brown, call your doctor. °In some cases, you may be given additional medicines to help with side effects. Follow all directions for their use. °Talk to your doctor about your risk of cancer. You may be more at risk for certain types of cancers if you take this medicine. °Do not become pregnant while taking this medicine or for 6 months after stopping it. Women should inform their healthcare professional if they wish to become pregnant or think they may be pregnant. Men should not father a child while taking this medicine and for 6 months after stopping it. There is a potential for serious side effects to an unborn child. Talk to your health care professional or pharmacist for more information. Do not breast-feed an infant while taking this medicine. °This medicine has caused ovarian failure in some women. This medicine may make it more difficult to get pregnant. Talk to your healthcare professional if you are concerned about your fertility. °This medicine has caused decreased sperm counts in some men. This may make   it more difficult to father a child. Talk to your healthcare professional if you are concerned about your fertility. °This medicine may cause a decrease in Co-Enzyme Q-10. You should make sure that you get enough Co-Enzyme Q-10 while you are taking this medicine. Discuss the foods you eat and the vitamins you take with your health care professional. °What side effects may I notice from receiving this medication? °Side effects that you should report to your doctor or health care professional as soon as possible: °allergic reactions like skin rash, itching or  hives, swelling of the face, lips, or tongue °low blood counts - this medicine may decrease the number of white blood cells, red blood cells and platelets. You may be at increased risk for infections and bleeding. °signs of hand-foot syndrome - tingling or burning, redness, flaking, swelling, small blisters, or small sores on the palms of your hands or the soles of your feet °signs of infection - fever or chills, cough, sore throat, pain or difficulty passing urine °signs of decreased platelets or bleeding - bruising, pinpoint red spots on the skin, black, tarry stools, blood in the urine °signs of decreased red blood cells - unusually weak or tired, fainting spells, lightheadedness °back pain, chills, facial flushing, fever, headache, tightness in the chest or throat during the infusion °breathing problems °chest pain °fast, irregular heartbeat °mouth pain, redness, sores °pain, swelling, redness at site where injected °pain, tingling, numbness in the hands or feet °swelling of ankles, feet, or hands °vomiting °Side effects that usually do not require medical attention (report to your doctor or health care professional if they continue or are bothersome): °diarrhea °hair loss °loss of appetite °nail discoloration or damage °nausea °red or watery eyes °red colored urine °stomach upset °This list may not describe all possible side effects. Call your doctor for medical advice about side effects. You may report side effects to FDA at 1-800-FDA-1088. °Where should I keep my medication? °This drug is given in a hospital or clinic and will not be stored at home. °NOTE: This sheet is a summary. It may not cover all possible information. If you have questions about this medicine, talk to your doctor, pharmacist, or health care provider. °© 2022 [employer] (2018-01-14 00:00:00) ° ° °

## 2021-07-16 NOTE — Progress Notes (Signed)
Spoke w/ patient, verified identity, and began nursing interview. Patient reports mild sinus headaches, that come & go. No other issues reported at this time. ? ?Meaningful use complete. ? ?Reminded patient of her 11:30am-07/18/21 telephone appointment w/ Ashlyn Bruning PA-C. I left my extension [phone] in case patient needs anything. Patient verbalized understanding of information. ? ?Patient contact [phone] ?

## 2021-07-17 ENCOUNTER — Encounter: Payer: Self-pay | Admitting: Hematology and Oncology

## 2021-07-17 ENCOUNTER — Telehealth: Payer: Self-pay | Admitting: *Deleted

## 2021-07-17 LAB — T4: T4, Total: 9.4 ug/dL (ref 4.5–12.0)

## 2021-07-17 NOTE — Assessment & Plan Note (Signed)
I have reviewed her MRI imaging which showed evidence of leptomeningeal disease She is not symptomatic It is not clear whether doxorubicin will have effect on her disease but presumably, with recent surgery and the breach of blood brain barrier, she might respond in the central nervous system as well We will proceed with chemotherapy as scheduled I plan to repeat imaging study after 3 cycles of treatment

## 2021-07-17 NOTE — Telephone Encounter (Signed)
Spoke with pt for chemo f/u call.  Per pt, she is doing well today.  Has increased appetite, drinking lots of water as tolerated.  Denied nausea/vomiting, bowel and bladder function fine.  Has no concerns nor questions at this time.  Pt aware of next f/u appt with Dr. Alvy Bimler on 3/27.

## 2021-07-17 NOTE — Assessment & Plan Note (Signed)
She has chronic kidney disease, multifactorial, related to aging, hypertension and history of hydronephrosis Observe closely

## 2021-07-17 NOTE — Assessment & Plan Note (Signed)
She has multifactorial anemia, combination of thalassemia and recent anemia chronic illness Her blood count has stabilized since we switch her treatment Monitor closely She denies recent bleeding

## 2021-07-17 NOTE — Assessment & Plan Note (Signed)
She has uncontrolled hypertension and proteinuria We discussed importance of blood pressure monitoring She will continue taking her blood pressure medications as directed

## 2021-07-17 NOTE — Progress Notes (Signed)
Teresa Oliver OFFICE PROGRESS NOTE  Patient Care Team: [facility] [place] as PCP - General (Rheumatology) [name], [name], RN as Oncology Nurse Navigator (Oncology)  ASSESSMENT & PLAN:  Uterine cancer [facility]) I have reviewed her MRI imaging which showed evidence of leptomeningeal disease She is not symptomatic It is not clear whether doxorubicin will have effect on her disease but presumably, with recent surgery and the breach of blood brain barrier, she might respond in the central nervous system as well We will proceed with chemotherapy as scheduled I plan to repeat imaging study after 3 cycles of treatment  Essential hypertension She has uncontrolled hypertension and proteinuria We discussed importance of blood pressure monitoring She will continue taking her blood pressure medications as directed  CKD (chronic kidney disease), stage III ([place]) She has chronic kidney disease, multifactorial, related to aging, hypertension and history of hydronephrosis Observe closely  Deficiency anemia She has multifactorial anemia, combination of thalassemia and recent anemia chronic illness Her blood count has stabilized since we switch her treatment Monitor closely She denies recent bleeding  No orders of the defined types were placed in this encounter.   All questions were answered. The patient knows to call the clinic with any problems, questions or concerns. The total time spent in the appointment was 30 minutes encounter with patients including review of chart and various tests results, discussions about plan of care and coordination of care plan   [name], MD 07/17/2021 12:01 PM  INTERVAL HISTORY: Please see below for problem oriented charting. she returns for treatment follow-up with her son She has not been checking her blood pressure lately She denies headache, changes in vision or weakness No recent bleeding Her appetite is fair although she have lost  some weight  REVIEW OF SYSTEMS:   Constitutional: Denies fevers, chills or abnormal weight loss Eyes: Denies blurriness of vision Ears, nose, mouth, throat, and face: Denies mucositis or sore throat Respiratory: Denies cough, dyspnea or wheezes Cardiovascular: Denies palpitation, chest discomfort or lower extremity swelling Gastrointestinal:  Denies nausea, heartburn or change in bowel habits Skin: Denies abnormal skin rashes Lymphatics: Denies new lymphadenopathy or easy bruising Neurological:Denies numbness, tingling or new weaknesses Behavioral/Psych: Mood is stable, no new changes  All other systems were reviewed with the patient and are negative.  I have reviewed the past medical history, past surgical history, social history and family history with the patient and they are unchanged from previous note.  ALLERGIES:  has No Known Allergies.  MEDICATIONS:  Current Outpatient Medications  Medication Sig Dispense Refill   amLODipine (NORVASC) 10 MG tablet Take 1 tablet (10 mg total) by mouth daily. 90 tablet 2   atenolol (TENORMIN) 50 MG tablet TAKE 1 TABLET(50 MG) BY MOUTH DAILY (Patient taking differently: Take 50 mg by mouth daily.) 30 tablet 2   cholecalciferol (VITAMIN D3) 25 MCG (1000 UNIT) tablet Take 1,000 Units by mouth daily.     doxazosin (CARDURA) 4 MG tablet TAKE 1 TABLET(4 MG) BY MOUTH DAILY 30 tablet 3   lidocaine-prilocaine (EMLA) cream Apply 1 application topically daily as needed. 30 g 3   LORazepam (ATIVAN) 0.5 MG tablet TAKE 1 TABLET(0.5 MG) BY MOUTH TWICE DAILY AS NEEDED FOR ANXIETY (Patient taking differently: Take 0.5 mg by mouth 2 (two) times daily as needed for anxiety.) 60 tablet 0   MAGNESIUM-OXIDE 400 (240 Mg) MG tablet TAKE 1 TABLET(400 MG) BY MOUTH TWICE DAILY (Patient taking differently: Take 400 mg by mouth 2 (two) times  daily.) 60 tablet 1   ondansetron (ZOFRAN) 8 MG tablet Take 1 tablet (8 mg total) by mouth every 8 (eight) hours as needed for nausea.  30 tablet 3   prochlorperazine (COMPAZINE) 10 MG tablet Take 1 tablet (10 mg total) by mouth every 6 (six) hours as needed for nausea or vomiting. 30 tablet 1   No current facility-administered medications for this visit.    SUMMARY OF ONCOLOGIC HISTORY: Oncology History Overview Note  High grade serous MMR normal Her2 negative Progressed on carboplatin, taxol, pembrolizumab and lenvima   Uterine cancer ([place])  08/28/2020 Imaging   1. Pelvic mass measuring 7.5 x 4.8 cm, contiguous with the sigmoid colon and uterus with loss of fat planes. Site of origin is unclear, may be ovarian, uterine, or colonic. Lack of contrast limits detailed assessment. 2. Above pelvic mass causes partial obstruction of the left ureter with mild left hydroureteronephrosis. 3. Multifocal adenopathy in the abdomen and pelvis, suspicious for metastatic disease, primarily in the mesentery and lower peritoneum and pelvis. Please note that detailed assessment of adenopathy is limited on this noncontrast exam. Recommend oncology referral.  4. Wall thickening of the sigmoid colon distal to the left pelvic mass with pericolonic edema, suspicious for colitis. There is a moderate to large amount of stool in the more proximal colon, suggesting pelvic mass may be causing delayed colonic transit. 5. Small to moderate hiatal hernia.   08/28/2020 - 09/04/2020 Hospital Admission   The patient has been complaining of reduced urine output, loss of appetite and changes in bowel habits She had abnormal imaging study leading to hospitalization, colonoscopy, biopsy and subsequently chemotherapy   08/30/2020 Procedure   CT-guided biopsy of enlarged left external iliac chain lymph node as above   08/30/2020 Pathology Results   SURGICAL PATHOLOGY  CASE: [id number]  PATIENT: Teresa Oliver  Surgical Pathology Report  Clinical History: Pelvic mass with metastatic lymph adenopathy ([facility])  FINAL MICROSCOPIC DIAGNOSIS:   A. LYMPH NODE, LEFT  EXTERNAL ILIAC CHAIN, BIOPSY:  - Poorly differentiated carcinoma with necrosis.  - See comment.   COMMENT:  The carcinoma is characterized by diffuse sheets of malignant epithelioid cells with prominent nucleoli and frequent mitotic figures. Immunohistochemistry is positive with cytokeratin 7 and PAX 8 with weak  positivity with estrogen receptor and WT1.  P53 shows diffuse strong nuclear positivity.  The carcinoma is negative with cytokeratin 20, CDX2, GATA3, GCDFP, TTF-1, HepPar 1, arginase 1 and CD10.  The  morphology and immunophenotype are most consistent with a gynecologic primary including high-grade serous carcinoma.    08/31/2020 Procedure   Colonoscopy - Preparation of the colon was fair. - Likely malignant partially obstructing tumor in the sigmoid colon (appears extrinsic process). Biopsied. Tattooed. - Internal hemorrhoids. - Stool in the entire examined colon   09/01/2020 Initial Diagnosis   Uterine cancer ([place])   09/01/2020 Procedure   Successful placement of a power injectable Port-A-Cath via the right internal jugular vein. The catheter is ready for immediate use.   09/04/2020 - 12/29/2020 Chemotherapy    Patient is on Treatment Plan: UTERINE CARBOPLATIN AUC 6 / PACLITAXEL Q21D       11/15/2020 Imaging   1. Marked interval response to therapy with decrease in size of the mass extending from the uterus to the colon that was seen on the previous examination. Also with decreased size and or resolution of pelvic, mesenteric and retroperitoneal adenopathy seen on previous imaging. 2. Tethering of the colon with area that raise the question of  developing colovaginal fistula versus internal enhancement within the mass that is now decreased in size. Attention on follow-up is suggested. Correlate with any current signs of vaginal drainage. 3. LEFT thoracic inlet adenopathy compatible with involvement in the chest. Area not imaged on previous imaging.   02/18/2021 Imaging   1.  Interval progression of left supraclavicular,, left thoracic inlet, left subpectoral, retroperitoneal, mesenteric and pelvic sidewall lymphadenopathy. Metastatic deposits in the central pelvis are also progressive. 2. Interval progression of left subpectoral lymphadenopathy with thrombus now visible in the left subclavian/innominate vein. 3. Aortic Atherosclerosis (ICD10-I70.0).   02/23/2021 - 06/15/2021 Chemotherapy   Patient is on Treatment Plan : UTERINE Lenvatinib + Pembrolizumab q21d     05/23/2021 Imaging   1. Decreased size of pelvic soft tissue lesions. 2. Decreased size of left supraclavicular and left subpectoral lymph nodes. 3. Overall decreased size of abdominal and pelvic lymph nodes; however, a few left-sided pelvic and inguinal lymph nodes have increased in size. 4. New mild compression deformity of the inferior endplate of L3. 5.  Aortic Atherosclerosis (ICD10-I70.0).     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  Operating microscope.,  Microdissection technique. Surgeon: Kristeen Miss First Assistant: Consuella Lose MD    06/01/2021 Pathology Results   SURGICAL PATHOLOGY  CASE: [phone]  PATIENT: Teresa Oliver  Surgical Pathology Report    FINAL MICROSCOPIC DIAGNOSIS:   A. BRAIN TUMOR, RIGHT CEREBELLAR, RESECTION:  Poorly differentiated carcinoma with morphology and immunoprofile consistent with metastatic previously diagnosed high-grade serous carcinoma.  Clinical correlation is required.   COMMENT:   Immunohistochemistry with appropriate controls for single antibody is positive for CK7, PAX8, WT1 and p53 and is negative for GFAP, CK20 and ER.    07/05/2021 Cancer Staging   Staging form: Corpus Uteri - Carcinoma and Carcinosarcoma, AJCC 8th Edition -  Clinical stage from 07/05/2021: FIGO Stage IVB (rcT4, cN2a, pM1) - Signed by [name], MD on 07/05/2021 Stage prefix: Recurrence    07/05/2021 Imaging   1. Today's study demonstrates definitive progression of disease, as evidenced by enlarging mass associated with the posterior aspect of the uterine fundus, which is intimately associated with loops of small bowel and sigmoid colon, worsening pelvic lymphadenopathy, possible adrenal metastasis, and multifocal osseous metastases, as detailed above. 2. Additional incidental findings, as above.       07/11/2021 Imaging   MRI brain 1. Postsurgical changes from right posterior fossa tumor resection with new focus of contrast enhancement at the right cerebellar hemisphere surgical cavity margins, concerning for recurrence. 2. 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 3. A 5 mm new lesion in the right cerebellar hemisphere. 4. Decrease in size of the previously described left frontal and parietal lesions with resolution of punctate contrast enhancement in the left cerebellar hemisphere.   07/12/2021 Echocardiogram    1. Left ventricular ejection fraction, by estimation, is 55 to 60%. The left ventricle has normal function. The left ventricle has no regional wall motion abnormalities. Left ventricular diastolic parameters are indeterminate. The average left ventricular global longitudinal strain is -17.3 %. The global longitudinal strain is normal.  2. Right ventricular systolic function is normal. The right ventricular size is normal. There is normal pulmonary artery systolic pressure.  3. Left atrial size was mild to moderately dilated.  4. Right atrial size  was mildly dilated.  5. The mitral valve is grossly normal. Trivial mitral valve regurgitation. No evidence of mitral stenosis. Moderate mitral annular calcification.  6. The aortic valve is grossly normal. There is moderate  calcification of the aortic valve. There is moderate thickening of the aortic valve. Aortic valve regurgitation is not visualized. Mild aortic valve stenosis.  7. The inferior vena cava is normal in size with greater than 50% respiratory variability, suggesting right atrial pressure of 3 mmHg.     07/16/2021 -  Chemotherapy   Patient is on Treatment Plan : Uterine Liposomal Doxorubicin q28d       PHYSICAL EXAMINATION: ECOG PERFORMANCE STATUS: 1 - Symptomatic but completely ambulatory  Vitals:   07/16/21 1356  BP: (!) 160/80  Pulse: 86  Resp: 18  Temp: 97.7 F (36.5 C)  SpO2: 100%   Filed Weights   07/16/21 1356  Weight: 110 lb 9.6 oz (50.2 kg)    GENERAL:alert, no distress and comfortable NEURO: alert & oriented x 3 with fluent speech, no focal motor/sensory deficits  LABORATORY DATA:  I have reviewed the data as listed    Component Value Date/Time   NA 138 07/16/2021 1335   K 3.8 07/16/2021 1335   CL 105 07/16/2021 1335   CO2 25 07/16/2021 1335   GLUCOSE 107 (H) 07/16/2021 1335   BUN 30 (H) 07/16/2021 1335   CREATININE 1.28 (H) 07/16/2021 1335   CALCIUM 9.8 07/16/2021 1335   PROT 7.4 07/16/2021 1335   ALBUMIN 4.2 07/16/2021 1335   AST 15 07/16/2021 1335   ALT 8 07/16/2021 1335   ALKPHOS 59 07/16/2021 1335   BILITOT 0.4 07/16/2021 1335   GFRNONAA 44 (L) 07/16/2021 1335    No results found for: SPEP, UPEP  Lab Results  Component Value Date   WBC 5.3 07/16/2021   NEUTROABS 3.0 07/16/2021   HGB 9.4 (L) 07/16/2021   HCT 29.3 (L) 07/16/2021   MCV 69.9 (L) 07/16/2021   PLT 229 07/16/2021      Chemistry      Component Value Date/Time   NA 138 07/16/2021 1335   K 3.8 07/16/2021 1335   CL 105 07/16/2021 1335   CO2 25 07/16/2021 1335   BUN 30 (H) 07/16/2021 1335   CREATININE 1.28 (H) 07/16/2021 1335      Component Value Date/Time   CALCIUM 9.8 07/16/2021 1335   ALKPHOS 59 07/16/2021 1335   AST 15 07/16/2021 1335   ALT 8 07/16/2021 1335   BILITOT 0.4  07/16/2021 1335       RADIOGRAPHIC STUDIES: I have personally reviewed the radiological images as listed and agreed with the findings in the report. MR BRAIN W WO CONTRAST  Result Date: 07/12/2021 CLINICAL DATA:  Malignant neoplasm metastatic to brain [facility]) C79.31 (ICD-10-CM). EXAM: MRI HEAD WITHOUT AND WITH CONTRAST TECHNIQUE: Multiplanar, multiecho pulse sequences of the brain and surrounding structures were obtained without and with intravenous contrast. CONTRAST:  59mL MULTIHANCE GADOBENATE DIMEGLUMINE 529 MG/ML IV SOLN COMPARISON:  MRI of the brain June 02, 2021. FINDINGS: Brain: Postsurgical changes from right posterior fossa tumor resection with surgical cavity in the right cerebellar hemisphere with significant interval decrease of the edema and mass effect. New nodular focus of contrast enhancement at the surgical cavity margins measuring approximately 11 x 8 mm. There is interval decrease in size of the previously described left frontal and parietal lesions, now measuring 3 mm and 6 mm, respectively mild the punctate focus of contrast enhancement in the inferior left  cerebellar hemisphere is no longer seen. However, multiple in new foci of leptomeningeal contrast enhancement are seen scattered along the cerebral sulci (series 12, images 46, 50, 67, 70, 80, 93, 98, 105, 114, 116, 125). A new lesion is also seen in the inferior right cerebellar hemisphere (series 12, image 23) measuring approximately 5 mm. Remote lacunar infarct in the left cerebellar hemisphere. Scattered and confluent foci of T2 hyperintensity within the white matter of the cerebral hemispheres and within the pons remains unchanged. Vascular: Normal flow voids. Skull and upper cervical spine: Postsurgical changes from right suboccipital craniectomy. Sinuses/Orbits: Bilateral lens surgery. Mucosal thickening with mucous retention cysts in the bilateral maxillary sinuses. Other: Right mastoid effusion. IMPRESSION: 1. Postsurgical  changes from right posterior fossa tumor resection with new focus of contrast enhancement at the right cerebellar hemisphere surgical cavity margins, concerning for recurrence. 2. 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 3. A 5 mm new lesion in the right cerebellar hemisphere. 4. Decrease in size of the previously described left frontal and parietal lesions with resolution of punctate contrast enhancement in the left cerebellar hemisphere. These results will be called to the ordering clinician or representative by the Radiologist Assistant, and communication documented in the PACS or [employer]. Electronically Signed   By: Pedro Earls M.D.   On: 07/12/2021 10:12   CT CHEST ABDOMEN PELVIS W CONTRAST  Result Date: 07/05/2021 CLINICAL DATA:  76 year old female with history of uterine/cervical cancer diagnosed in 2022 status post chemotherapy which is complete, as well as radiation therapy to the brain which is now complete. Follow-up study. EXAM: CT CHEST, ABDOMEN, AND PELVIS WITH CONTRAST TECHNIQUE: Multidetector CT imaging of the chest, abdomen and pelvis was performed following the standard protocol during bolus administration of intravenous contrast. RADIATION DOSE REDUCTION: This exam was performed according to the departmental dose-optimization program which includes automated exposure control, adjustment of the mA and/or kV according to patient size and/or use of iterative reconstruction technique. CONTRAST:  72mL OMNIPAQUE IOHEXOL 300 MG/ML  SOLN COMPARISON:  CT the chest, abdomen and pelvis 05/24/2021. FINDINGS: CT CHEST FINDINGS Cardiovascular: Heart size is normal. There is no significant pericardial fluid, thickening or pericardial calcification. No atherosclerotic calcifications are noted in the thoracic aorta or the coronary arteries. Right internal jugular single-lumen porta cath with tip terminating at the  superior cavoatrial junction. Mediastinum/Nodes: No pathologically enlarged mediastinal, hilar or internal mammary lymph nodes. Small hiatal hernia. No axillary or subpectoral lymphadenopathy. Lungs/Pleura: No suspicious appearing pulmonary nodules or masses are noted. No acute consolidative airspace disease. No pleural effusions. Areas of mild linear scarring are noted in the lung bases bilaterally. Musculoskeletal: Sclerotic lesion in the sternum (sagittal image 90 of series 5), new compared to prior examinations, concerning for probable metastatic lesion. Additional small sclerotic lesion in the posterior aspect of the inferior endplate of T1 (sagittal image 96 of series 5), suspicious for an additional metastatic lesion. Compression fracture of T5 with 25% loss of anterior vertebral body height, unchanged. CT ABDOMEN PELVIS FINDINGS Hepatobiliary: No suspicious cystic or solid hepatic lesions. No intra or extrahepatic biliary ductal dilatation. Gallbladder is normal in appearance. Pancreas: No pancreatic mass. No pancreatic ductal dilatation. No pancreatic or peripancreatic fluid collections or inflammatory changes. Spleen: Unremarkable. Adrenals/Urinary Tract: New nodularity in the right adrenal gland concerning for potential metastatic lesion (axial image 48 of series 2) measuring 1.5 x 1.3 cm. Bilateral kidneys and left adrenal gland are normal in appearance.  No hydroureteronephrosis. Urinary bladder is normal in appearance. Stomach/Bowel: The appearance of the stomach is normal. There is no pathologic dilatation of small bowel or colon. Normal appendix. Vascular/Lymphatic: No significant atherosclerotic disease, aneurysm or dissection noted in the abdominal or pelvic vasculature. Interval enlargement of multiple pelvic lymph nodes bilaterally (left-greater-than-right), indicative of progression of metastatic disease. Largest left-sided lymph nodes include a 1.6 cm short axis left inguinal lymph node (axial  image 102 of series 2) 2.2 cm short axis left external iliac lymph node (axial image 94 of series 2), and 1.8 cm short axis left internal iliac lymph node (axial image 91 of series 2). The largest right-sided pelvic lymph node is along the right pelvic sidewall between the right external iliac and internal iliac nodal stations measuring 1.5 cm in short axis (axial image 90 of series 2). No definite abdominal lymphadenopathy. Reproductive: Previously noted soft tissue mass extending from the posterior aspect of the uterine fundus (axial image 91 of series 2 and coronal image 80 of series 4) currently measures 2.6 x 2.1 x 2.0 cm, slightly larger than the prior examination, again intimately associated with the adjacent sigmoid colon and several distal small bowel loops (likely ileum) which appear tethered to the lesion. Other: No significant volume of ascites.  No pneumoperitoneum. Musculoskeletal: Mixed lucency and sclerosis in the L3 vertebral body with probable pathologic fractures of both the superior and inferior endplates, slightly more conspicuous than the recent prior examination. Increasingly conspicuous sclerotic lesion in the posterior aspect of the L2 vertebral body measuring 8 mm (sagittal image 90 of series 5), suspicious for a treated metastatic lesion. IMPRESSION: 1. Today's study demonstrates definitive progression of disease, as evidenced by enlarging mass associated with the posterior aspect of the uterine fundus, which is intimately associated with loops of small bowel and sigmoid colon, worsening pelvic lymphadenopathy, possible adrenal metastasis, and multifocal osseous metastases, as detailed above. 2. Additional incidental findings, as above. Electronically Signed   By: Vinnie Langton M.D.   On: 07/05/2021 06:18   ECHOCARDIOGRAM COMPLETE  Result Date: 07/12/2021    ECHOCARDIOGRAM REPORT   Patient Name:   Teresa Oliver Date of Exam: 07/12/2021 Medical Rec #:  [id number]       Height:        60.0 in Accession #:    [id number]      Weight:       110.8 lb Date of Birth:  Jun 01, 1945        BSA:          1.452 m Patient Age:    30 years        BP:           153/89 mmHg Patient Gender: F               HR:           86 bpm. Exam Location:  Outpatient Procedure: 2D Echo, Cardiac Doppler, Color Doppler and Strain Analysis Indications:    Z51.11 Encounter for antineoplastic chemotheraphy  History:        Patient has no prior history of Echocardiogram examinations.                 Risk Factors:Hypertension, Non-Smoker and CKD stage 3. Patient                 denies chest pain, SOB and leg edema. Patient had chemotherapy  for uterine cancer and will start chemotherapy for brain cancer.  Sonographer:    Salvadore Dom RVT, RDCS (AE), RDMS Referring Phys: [phone] Karmel Patricelli [place]  1. Left ventricular ejection fraction, by estimation, is 55 to 60%. The left ventricle has normal function. The left ventricle has no regional wall motion abnormalities. Left ventricular diastolic parameters are indeterminate. The average left ventricular global longitudinal strain is -17.3 %. The global longitudinal strain is normal.  2. Right ventricular systolic function is normal. The right ventricular size is normal. There is normal pulmonary artery systolic pressure.  3. Left atrial size was mild to moderately dilated.  4. Right atrial size was mildly dilated.  5. The mitral valve is grossly normal. Trivial mitral valve regurgitation. No evidence of mitral stenosis. Moderate mitral annular calcification.  6. The aortic valve is grossly normal. There is moderate calcification of the aortic valve. There is moderate thickening of the aortic valve. Aortic valve regurgitation is not visualized. Mild aortic valve stenosis.  7. The inferior vena cava is normal in size with greater than 50% respiratory variability, suggesting right atrial pressure of 3 mmHg. Comparison(s): No prior Echocardiogram.  Conclusion(s)/Recommendation(s): Otherwise normal echocardiogram, with minor abnormalities described in the report. FINDINGS  Left Ventricle: Left ventricular ejection fraction, by estimation, is 55 to 60%. The left ventricle has normal function. The left ventricle has no regional wall motion abnormalities. The average left ventricular global longitudinal strain is -17.3 %. The global longitudinal strain is normal. The left ventricular internal cavity size was normal in size. There is no left ventricular hypertrophy. Left ventricular diastolic parameters are indeterminate. Right Ventricle: The right ventricular size is normal. Right vetricular wall thickness was not well visualized. Right ventricular systolic function is normal. There is normal pulmonary artery systolic pressure. The tricuspid regurgitant velocity is 2.52 m/s, and with an assumed right atrial pressure of 3 mmHg, the estimated right ventricular systolic pressure is 21.1 mmHg. Left Atrium: Left atrial size was mild to moderately dilated. Right Atrium: Right atrial size was mildly dilated. Pericardium: There is no evidence of pericardial effusion. Mitral Valve: The mitral valve is grossly normal. Moderate mitral annular calcification. Trivial mitral valve regurgitation. No evidence of mitral valve stenosis. Tricuspid Valve: The tricuspid valve is normal in structure. Tricuspid valve regurgitation is mild . No evidence of tricuspid stenosis. Aortic Valve: The aortic valve is grossly normal. There is moderate calcification of the aortic valve. There is moderate thickening of the aortic valve. Aortic valve regurgitation is not visualized. Mild aortic stenosis is present. Aortic valve mean gradient measures 5.0 mmHg. Aortic valve peak gradient measures 9.5 mmHg. Aortic valve area, by VTI measures 1.66 cm. Pulmonic Valve: The pulmonic valve was not well visualized. Pulmonic valve regurgitation is trivial. No evidence of pulmonic stenosis. Aorta: The aortic  root, ascending aorta, aortic arch and descending aorta are all structurally normal, with no evidence of dilitation or obstruction. Venous: The inferior vena cava is normal in size with greater than 50% respiratory variability, suggesting right atrial pressure of 3 mmHg. IAS/Shunts: The atrial septum is grossly normal.  LEFT VENTRICLE PLAX 2D LVIDd:         4.06 cm     Diastology LVIDs:         2.86 cm     LV e' medial:    5.66 cm/s LV PW:         0.77 cm     LV E/e' medial:  11.1 LV IVS:  0.62 cm     LV e' lateral:   9.46 cm/s LVOT diam:     2.00 cm     LV E/e' lateral: 6.6 LV SV:         54 LV SV Index:   37          2D Longitudinal Strain LVOT Area:     3.14 cm    2D Strain GLS (A2C):   -18.8 %                            2D Strain GLS (A3C):   -13.1 %                            2D Strain GLS (A4C):   -20.2 % LV Volumes (MOD)           2D Strain GLS Avg:     -17.3 % LV vol d, MOD A2C: 52.6 ml LV vol d, MOD A4C: 78.2 ml LV vol s, MOD A2C: 23.2 ml LV vol s, MOD A4C: 27.7 ml 3D Volume EF: LV SV MOD A2C:     29.4 ml 3D EF:        66 % LV SV MOD A4C:     78.2 ml LV EDV:       87 ml LV SV MOD BP:      38.9 ml LV ESV:       30 ml                            LV SV:        57 ml RIGHT VENTRICLE RV S prime:     10.80 cm/s TAPSE (M-mode): 1.9 cm LEFT ATRIUM             Index        RIGHT ATRIUM           Index LA diam:        3.20 cm 2.20 cm/m   RA Area:     12.60 cm LA Vol (A2C):   48.1 ml 33.12 ml/m  RA Volume:   31.40 ml  21.62 ml/m LA Vol (A4C):   55.9 ml 38.49 ml/m LA Biplane Vol: 51.6 ml 35.53 ml/m  AORTIC VALVE                     PULMONIC VALVE AV Area (Vmax):    1.94 cm      PV Vmax:          0.95 m/s AV Area (Vmean):   1.72 cm      PV Peak grad:     3.6 mmHg AV Area (VTI):     1.66 cm      PR End Diast Vel: 1.08 msec AV Vmax:           154.00 cm/s AV Vmean:          108.000 cm/s AV VTI:            0.325 m AV Peak Grad:      9.5 mmHg AV Mean Grad:      5.0 mmHg LVOT Vmax:         95.00 cm/s LVOT  Vmean:        59.000 cm/s LVOT VTI:          0.172 m  LVOT/AV VTI ratio: 0.53  AORTA Ao Root diam: 3.00 cm Ao Asc diam:  3.60 cm Ao Arch diam: 3.3 cm MITRAL VALVE               TRICUSPID VALVE MV Area (PHT): 2.31 cm    TR Peak grad:   25.4 mmHg MV Decel Time: 329 msec    TR Vmax:        252.00 cm/s MV E velocity: 62.90 cm/s MV A velocity: 95.40 cm/s  SHUNTS MV E/A ratio:  0.66        Systemic VTI:  0.17 m                            Systemic Diam: 2.00 cm Buford Dresser MD Electronically signed by Buford Dresser MD Signature Date/Time: 07/12/2021/6:33:47 PM    Final

## 2021-07-18 ENCOUNTER — Other Ambulatory Visit: Payer: Self-pay | Admitting: Hematology and Oncology

## 2021-07-18 ENCOUNTER — Ambulatory Visit
Admission: RE | Admit: 2021-07-18 | Discharge: 2021-07-18 | Disposition: A | Discharge status: Home / Self Care | Payer: Medicare Other | Source: Ambulatory Visit | Attending: Urology | Admitting: Urology

## 2021-07-18 DIAGNOSIS — C7931 Secondary malignant neoplasm of brain: Secondary | ICD-10-CM

## 2021-07-18 NOTE — Progress Notes (Signed)
Radiation Oncology         [phone] ________________________________  Name: Teresa Oliver MRN: [id number]  Date: 07/18/2021  DOB: [birth year]  Post Treatment Note  CC: [facility], [place] *  Diagnosis:   76 y.o. 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  The patient was notified in advance and gave permission to proceed with this visit format.  She tolerated the radiation treatment relatively well, without any ill side effects.   She had a recent posttreatment MRI brain scan on 07/11/2021 which was reviewed in the most recent multidisciplinary brain tumor board and shows disease progression with multiple new foci of leptomeningeal contrast enhancement scattered along the cerebral sulci as well as a new 5 mm lesion in the inferior right cerebellar hemisphere.  We discussed these findings today.   She also had evidence of systemic disease progression on her recent restaging CT C/A/P performed on 07/04/2021 and revealing an enlarging mass associated with the posterior aspect of the uterine fundus, intimately associated with loops of small bowel and sigmoid colon with worsening pelvic lymphadenopathy, possible adrenal metastasis and multifocal osseous metastases.  Her systemic therapy was recently switched to doxorubicin with her first dose given 07/12/2021 and her second dose scheduled for 08/13/2021.                On review of systems, the patient states that she is doing well in general.  She denies any  chest pain, shortness of breath, cough, fevers, chills, night sweats, or unintended weight changes.  She has not had any further dizziness, focal weakness, headache, tremor or seizure activity.  She has remained active and is in good spirits.  She denies any bowel or bladder disturbances, and denies abdominal pain, nausea or vomiting.  She denies any new musculoskeletal or joint aches or pains, new skin lesions or concerns. A complete review of systems is obtained and is otherwise negative.   ALLERGIES:  has No Known Allergies.  Meds: Current Outpatient Medications  Medication Sig Dispense Refill   amLODipine (NORVASC) 10 MG tablet Take 1 tablet (10 mg total) by mouth daily. 90 tablet 2   atenolol (TENORMIN) 50 MG tablet Take 1 tablet (50 mg total) by mouth daily. 30 tablet 1   cholecalciferol (VITAMIN D3) 25 MCG (1000 UNIT) tablet Take 1,000 Units by mouth daily.     doxazosin (CARDURA) 4 MG tablet TAKE 1 TABLET(4 MG) BY MOUTH DAILY 30 tablet 3   lidocaine-prilocaine (EMLA) cream Apply 1 application topically daily as needed. 30 g 3   LORazepam (ATIVAN) 0.5 MG tablet TAKE 1 TABLET(0.5 MG) BY MOUTH TWICE DAILY AS NEEDED FOR ANXIETY (Patient taking differently: Take 0.5 mg by mouth 2 (two) times daily as needed for anxiety.) 60 tablet 0   MAGNESIUM-OXIDE 400 (240 Mg) MG tablet TAKE 1 TABLET(400 MG) BY MOUTH TWICE DAILY (Patient taking differently: Take 400 mg by mouth 2 (two) times daily.) 60 tablet 1   ondansetron (ZOFRAN) 8 MG tablet Take 1 tablet (8 mg total) by mouth every 8 (eight) hours  as needed for nausea. 30 tablet 3   prochlorperazine (COMPAZINE) 10 MG tablet Take 1 tablet (10 mg total) by mouth every 6 (six) hours as needed for nausea or vomiting. 30 tablet 1   No current facility-administered medications for this encounter.    Physical Findings:  vitals were not taken for this visit.  Pain Assessment Pain Score: 2  (slight sinus pain)/10 Unable to assess due to telephone follow-up  visit format.  Lab Findings: Lab Results  Component Value Date   WBC 5.3 07/16/2021   HGB 9.4 (L) 07/16/2021   HCT 29.3 (L) 07/16/2021   MCV 69.9 (L) 07/16/2021   PLT 229 07/16/2021     Radiographic Findings: MR BRAIN W WO CONTRAST  Result Date: 07/12/2021 CLINICAL DATA:  Malignant neoplasm metastatic to brain [facility]) C79.31 (ICD-10-CM). EXAM: MRI HEAD WITHOUT AND WITH CONTRAST TECHNIQUE: Multiplanar, multiecho pulse sequences of the brain and surrounding structures were obtained without and with intravenous contrast. CONTRAST:  61mL MULTIHANCE GADOBENATE DIMEGLUMINE 529 MG/ML IV SOLN COMPARISON:  MRI of the brain June 02, 2021. FINDINGS: Brain: Postsurgical changes from right posterior fossa tumor resection with surgical cavity in the right cerebellar hemisphere with significant interval decrease of the edema and mass effect. New nodular focus of contrast enhancement at the surgical cavity margins measuring approximately 11 x 8 mm. There is interval decrease in size of the previously described left frontal and parietal lesions, now measuring 3 mm and 6 mm, respectively mild the punctate focus of contrast enhancement in the inferior left cerebellar hemisphere is no longer seen. However, multiple in new foci of leptomeningeal contrast enhancement are seen scattered along the cerebral sulci (series 12, images 46, 50, 67, 70, 80, 93, 98, 105, 114, 116, 125). A new lesion is also seen in the inferior right cerebellar hemisphere (series 12, image 23) measuring approximately 5 mm. Remote lacunar infarct in the left cerebellar hemisphere. Scattered and confluent foci of T2 hyperintensity within the white matter of the cerebral hemispheres and within the pons remains unchanged. Vascular: Normal flow voids. Skull and upper cervical spine: Postsurgical changes from right suboccipital craniectomy. Sinuses/Orbits: Bilateral lens surgery. Mucosal thickening with mucous retention cysts in the bilateral maxillary  sinuses. Other: Right mastoid effusion. IMPRESSION: 1. Postsurgical changes from right posterior fossa tumor resection with new focus of contrast enhancement at the right cerebellar hemisphere surgical cavity margins, concerning for recurrence. 2. 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 3. A 5 mm new lesion in the right cerebellar hemisphere. 4. Decrease in size of the previously described left frontal and parietal lesions with resolution of punctate contrast enhancement in the left cerebellar hemisphere. These results will be called to the ordering clinician or representative by the Radiologist Assistant, and communication documented in the PACS or [employer]. Electronically Signed   By: Pedro Earls M.D.   On: 07/12/2021 10:12   CT CHEST ABDOMEN PELVIS W CONTRAST  Result Date: 07/05/2021 CLINICAL DATA:  76 year old female with history of uterine/cervical cancer diagnosed in 2022 status post chemotherapy which is complete, as well as radiation therapy to the brain which is now complete. Follow-up study. EXAM: CT CHEST, ABDOMEN, AND PELVIS WITH CONTRAST TECHNIQUE: Multidetector CT imaging of the chest, abdomen and pelvis was performed following the standard protocol during bolus administration of intravenous contrast. RADIATION DOSE REDUCTION: This exam was performed according to the departmental dose-optimization program which includes automated exposure control, adjustment of the mA and/or kV  according to patient size and/or use of iterative reconstruction technique. CONTRAST:  36mL OMNIPAQUE IOHEXOL 300 MG/ML  SOLN COMPARISON:  CT the chest, abdomen and pelvis 05/24/2021. FINDINGS: CT CHEST FINDINGS Cardiovascular: Heart size is normal. There is no significant pericardial fluid, thickening or pericardial calcification. No atherosclerotic calcifications are noted in the thoracic aorta or the coronary arteries. Right  internal jugular single-lumen porta cath with tip terminating at the superior cavoatrial junction. Mediastinum/Nodes: No pathologically enlarged mediastinal, hilar or internal mammary lymph nodes. Small hiatal hernia. No axillary or subpectoral lymphadenopathy. Lungs/Pleura: No suspicious appearing pulmonary nodules or masses are noted. No acute consolidative airspace disease. No pleural effusions. Areas of mild linear scarring are noted in the lung bases bilaterally. Musculoskeletal: Sclerotic lesion in the sternum (sagittal image 90 of series 5), new compared to prior examinations, concerning for probable metastatic lesion. Additional small sclerotic lesion in the posterior aspect of the inferior endplate of T1 (sagittal image 96 of series 5), suspicious for an additional metastatic lesion. Compression fracture of T5 with 25% loss of anterior vertebral body height, unchanged. CT ABDOMEN PELVIS FINDINGS Hepatobiliary: No suspicious cystic or solid hepatic lesions. No intra or extrahepatic biliary ductal dilatation. Gallbladder is normal in appearance. Pancreas: No pancreatic mass. No pancreatic ductal dilatation. No pancreatic or peripancreatic fluid collections or inflammatory changes. Spleen: Unremarkable. Adrenals/Urinary Tract: New nodularity in the right adrenal gland concerning for potential metastatic lesion (axial image 48 of series 2) measuring 1.5 x 1.3 cm. Bilateral kidneys and left adrenal gland are normal in appearance. No hydroureteronephrosis. Urinary bladder is normal in appearance. Stomach/Bowel: The appearance of the stomach is normal. There is no pathologic dilatation of small bowel or colon. Normal appendix. Vascular/Lymphatic: No significant atherosclerotic disease, aneurysm or dissection noted in the abdominal or pelvic vasculature. Interval enlargement of multiple pelvic lymph nodes bilaterally (left-greater-than-right), indicative of progression of metastatic disease. Largest left-sided  lymph nodes include a 1.6 cm short axis left inguinal lymph node (axial image 102 of series 2) 2.2 cm short axis left external iliac lymph node (axial image 94 of series 2), and 1.8 cm short axis left internal iliac lymph node (axial image 91 of series 2). The largest right-sided pelvic lymph node is along the right pelvic sidewall between the right external iliac and internal iliac nodal stations measuring 1.5 cm in short axis (axial image 90 of series 2). No definite abdominal lymphadenopathy. Reproductive: Previously noted soft tissue mass extending from the posterior aspect of the uterine fundus (axial image 91 of series 2 and coronal image 80 of series 4) currently measures 2.6 x 2.1 x 2.0 cm, slightly larger than the prior examination, again intimately associated with the adjacent sigmoid colon and several distal small bowel loops (likely ileum) which appear tethered to the lesion. Other: No significant volume of ascites.  No pneumoperitoneum. Musculoskeletal: Mixed lucency and sclerosis in the L3 vertebral body with probable pathologic fractures of both the superior and inferior endplates, slightly more conspicuous than the recent prior examination. Increasingly conspicuous sclerotic lesion in the posterior aspect of the L2 vertebral body measuring 8 mm (sagittal image 90 of series 5), suspicious for a treated metastatic lesion. IMPRESSION: 1. Today's study demonstrates definitive progression of disease, as evidenced by enlarging mass associated with the posterior aspect of the uterine fundus, which is intimately associated with loops of small bowel and sigmoid colon, worsening pelvic lymphadenopathy, possible adrenal metastasis, and multifocal osseous metastases, as detailed above. 2. Additional incidental findings, as above. Electronically Signed   By:  Vinnie Langton M.D.   On: 07/05/2021 06:18   ECHOCARDIOGRAM COMPLETE  Result Date: 07/12/2021    ECHOCARDIOGRAM REPORT   Patient Name:   Teresa Oliver Date of Exam: 07/12/2021 Medical Rec #:  [id number]       Height:       60.0 in Accession #:    [id number]      Weight:       110.8 lb Date of Birth:  [birth year]        BSA:          1.452 m Patient Age:    52 years        BP:           153/89 mmHg Patient Gender: F               HR:           86 bpm. Exam Location:  Outpatient Procedure: 2D Echo, Cardiac Doppler, Color Doppler and Strain Analysis Indications:    Z51.11 Encounter for antineoplastic chemotheraphy  History:        Patient has no prior history of Echocardiogram examinations.                 Risk Factors:Hypertension, Non-Smoker and CKD stage 3. Patient                 denies chest pain, SOB and leg edema. Patient had chemotherapy                 for uterine cancer and will start chemotherapy for brain cancer.  Sonographer:    Salvadore Dom RVT, RDCS (AE), RDMS Referring Phys: [id number] NI Boaz  1. Left ventricular ejection fraction, by estimation, is 55 to 60%. The left ventricle has normal function. The left ventricle has no regional wall motion abnormalities. Left ventricular diastolic parameters are indeterminate. The average left ventricular global longitudinal strain is -17.3 %. The global longitudinal strain is normal.  2. Right ventricular systolic function is normal. The right ventricular size is normal. There is normal pulmonary artery systolic pressure.  3. Left atrial size was mild to moderately dilated.  4. Right atrial size was mildly dilated.  5. The mitral valve is grossly normal. Trivial mitral valve regurgitation. No evidence of mitral stenosis. Moderate mitral annular calcification.  6. The aortic valve is grossly normal. There is moderate calcification of the aortic valve. There is moderate thickening of the aortic valve. Aortic valve regurgitation is not visualized. Mild aortic valve stenosis.  7. The inferior vena cava is normal in size with greater than 50% respiratory variability, suggesting right atrial pressure  of 3 mmHg. Comparison(s): No prior Echocardiogram. Conclusion(s)/Recommendation(s): Otherwise normal echocardiogram, with minor abnormalities described in the report. FINDINGS  Left Ventricle: Left ventricular ejection fraction, by estimation, is 55 to 60%. The left ventricle has normal function. The left ventricle has no regional wall motion abnormalities. The average left ventricular global longitudinal strain is -17.3 %. The global longitudinal strain is normal. The left ventricular internal cavity size was normal in size. There is no left ventricular hypertrophy. Left ventricular diastolic parameters are indeterminate. Right Ventricle: The right ventricular size is normal. Right vetricular wall thickness was not well visualized. Right ventricular systolic function is normal. There is normal pulmonary artery systolic pressure. The tricuspid regurgitant velocity is 2.52 m/s, and with an assumed right atrial pressure of 3 mmHg, the estimated right ventricular systolic pressure is 22.2 mmHg. Left Atrium: Left atrial size was  mild to moderately dilated. Right Atrium: Right atrial size was mildly dilated. Pericardium: There is no evidence of pericardial effusion. Mitral Valve: The mitral valve is grossly normal. Moderate mitral annular calcification. Trivial mitral valve regurgitation. No evidence of mitral valve stenosis. Tricuspid Valve: The tricuspid valve is normal in structure. Tricuspid valve regurgitation is mild . No evidence of tricuspid stenosis. Aortic Valve: The aortic valve is grossly normal. There is moderate calcification of the aortic valve. There is moderate thickening of the aortic valve. Aortic valve regurgitation is not visualized. Mild aortic stenosis is present. Aortic valve mean gradient measures 5.0 mmHg. Aortic valve peak gradient measures 9.5 mmHg. Aortic valve area, by VTI measures 1.66 cm. Pulmonic Valve: The pulmonic valve was not well visualized. Pulmonic valve regurgitation is trivial.  No evidence of pulmonic stenosis. Aorta: The aortic root, ascending aorta, aortic arch and descending aorta are all structurally normal, with no evidence of dilitation or obstruction. Venous: The inferior vena cava is normal in size with greater than 50% respiratory variability, suggesting right atrial pressure of 3 mmHg. IAS/Shunts: The atrial septum is grossly normal.  LEFT VENTRICLE PLAX 2D LVIDd:         4.06 cm     Diastology LVIDs:         2.86 cm     LV e' medial:    5.66 cm/s LV PW:         0.77 cm     LV E/e' medial:  11.1 LV IVS:        0.62 cm     LV e' lateral:   9.46 cm/s LVOT diam:     2.00 cm     LV E/e' lateral: 6.6 LV SV:         54 LV SV Index:   37          2D Longitudinal Strain LVOT Area:     3.14 cm    2D Strain GLS (A2C):   -18.8 %                            2D Strain GLS (A3C):   -13.1 %                            2D Strain GLS (A4C):   -20.2 % LV Volumes (MOD)           2D Strain GLS Avg:     -17.3 % LV vol d, MOD A2C: 52.6 ml LV vol d, MOD A4C: 78.2 ml LV vol s, MOD A2C: 23.2 ml LV vol s, MOD A4C: 27.7 ml 3D Volume EF: LV SV MOD A2C:     29.4 ml 3D EF:        66 % LV SV MOD A4C:     78.2 ml LV EDV:       87 ml LV SV MOD BP:      38.9 ml LV ESV:       30 ml                            LV SV:        57 ml RIGHT VENTRICLE RV S prime:     10.80 cm/s TAPSE (M-mode): 1.9 cm LEFT ATRIUM             Index  RIGHT ATRIUM           Index LA diam:        3.20 cm 2.20 cm/m   RA Area:     12.60 cm LA Vol (A2C):   48.1 ml 33.12 ml/m  RA Volume:   31.40 ml  21.62 ml/m LA Vol (A4C):   55.9 ml 38.49 ml/m LA Biplane Vol: 51.6 ml 35.53 ml/m  AORTIC VALVE                     PULMONIC VALVE AV Area (Vmax):    1.94 cm      PV Vmax:          0.95 m/s AV Area (Vmean):   1.72 cm      PV Peak grad:     3.6 mmHg AV Area (VTI):     1.66 cm      PR End Diast Vel: 1.08 msec AV Vmax:           154.00 cm/s AV Vmean:          108.000 cm/s AV VTI:            0.325 m AV Peak Grad:      9.5 mmHg AV Mean Grad:       5.0 mmHg LVOT Vmax:         95.00 cm/s LVOT Vmean:        59.000 cm/s LVOT VTI:          0.172 m LVOT/AV VTI ratio: 0.53  AORTA Ao Root diam: 3.00 cm Ao Asc diam:  3.60 cm Ao Arch diam: 3.3 cm MITRAL VALVE               TRICUSPID VALVE MV Area (PHT): 2.31 cm    TR Peak grad:   25.4 mmHg MV Decel Time: 329 msec    TR Vmax:        252.00 cm/s MV E velocity: 62.90 cm/s MV A velocity: 95.40 cm/s  SHUNTS MV E/A ratio:  0.66        Systemic VTI:  0.17 m                            Systemic Diam: 2.00 cm Buford Dresser MD Electronically signed by Buford Dresser MD Signature Date/Time: 07/12/2021/6:33:47 PM    Final     Impression/Plan: 1. 76 y.o. 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 She appears to have recovered well from the effects of her recent SRS brain treatment and remains without complaints.  She is tolerating the new systemic therapy well aside from some mild fatigue.  We discussed the findings on her recent posttreatment MRI brain scan which showed disease progression with likely leptomeningeal spread.  Her scan was reviewed at our most recent multidisciplinary brain tumor board and consensus recommendation is to proceed with whole brain radiation.  We again reviewed the pros and cons associated with whole brain radiotherapy which would treat the known metastatic deposits and help provide some reduction of risk for future brain metastases but carries potential risks including hair loss, subacute somnolence, and neurocognitive changes, including a possible reduction in short-term memory. Whole brain radiotherapy also may carry a lower likelihood of tumor control at the treatment sites because of the low-dose used. We also discussed the logistics and delivery of whole brain radiation, delivered daily over the course of 2 weeks.  She appears to have a good understanding of her disease and our treatment recommendations which are  of palliative intent.  She was encouraged to ask questions were answered to her stated satisfaction and she is comfortable and in agreement with the plan.  Since she recently started the new systemic therapy with doxorubicin, the recommendation is to start the 2-week course of WBRT on 07/30/21 and would complete 08/10/21, prior to her next scheduled dose of doxorubicin.  Dr. Alvy Bimler is aware of these recommendations and is in agreement with this plan so we will move forward with treatment planning accordingly and schedule her CT simulation for next week.  She knows that she is welcome to call at anytime in the interim with any further questions or concerns related to the radiation treatment.    Nicholos Johns, PA-C

## 2021-07-23 NOTE — Progress Notes (Signed)
?  Radiation Oncology         [phone] ?________________________________ ? ?Name: Teresa Oliver MRN: [id number]  ?Date: 07/24/2021  DOB: [birth year] ? ?SIMULATION AND TREATMENT PLANNING NOTE ? ?  ICD-10-CM   ?1. Brain metastases ([place])  C79.31   ?  ? ? ?DIAGNOSIS:  76 y.o. female with numerous brain metastases secondary to metastatic uterine cancer  ? ?NARRATIVE:  The patient was brought to the [facility].  Identity was confirmed.  All relevant records and images related to the planned course of therapy were reviewed.  The patient freely provided informed written consent to proceed with treatment after reviewing the details related to the planned course of therapy. The consent form was witnessed and verified by the simulation staff.  Then, the patient was set-up in a stable reproducible  supine position for radiation therapy.  CT images were obtained.  Surface markings were placed.  The CT images were loaded into the planning software.  Then the target and avoidance structures were contoured.  Treatment planning then occurred.  The radiation prescription was entered and confirmed.  Then, I designed and supervised the construction of a total of 3 medically necessary complex treatment device including a custom made thermoplastic mask used for immobilization, and two MLC collimator apertures for radiotherapy from the right and left side, with independent collimation for each to account for beam divergence. ? ?I have requested : Isodose Plan.   ? ?PLAN:  The whole brain will be treated to 30 Gy in 10 fractions. ? ?________________________________ ? ?Sheral Apley Tammi Klippel, M.D. ? ?

## 2021-07-24 ENCOUNTER — Other Ambulatory Visit: Payer: Self-pay

## 2021-07-24 ENCOUNTER — Ambulatory Visit
Admission: RE | Admit: 2021-07-24 | Discharge: 2021-07-24 | Disposition: A | Discharge status: Home / Self Care | Payer: Medicare Other | Source: Ambulatory Visit | Attending: Radiation Oncology | Admitting: Radiation Oncology

## 2021-07-24 ENCOUNTER — Ambulatory Visit: Payer: Medicare Other

## 2021-07-24 VITALS — BP 123/78 | HR 84 | Temp 97.8°F | Resp 20 | Ht 59.0 in | Wt 114.4 lb

## 2021-07-24 DIAGNOSIS — C7931 Secondary malignant neoplasm of brain: Secondary | ICD-10-CM | POA: Insufficient documentation

## 2021-07-24 DIAGNOSIS — Z79899 Other long term (current) drug therapy: Secondary | ICD-10-CM | POA: Diagnosis not present

## 2021-07-30 ENCOUNTER — Other Ambulatory Visit: Payer: Self-pay | Admitting: Hematology and Oncology

## 2021-07-30 DIAGNOSIS — C7931 Secondary malignant neoplasm of brain: Secondary | ICD-10-CM | POA: Diagnosis not present

## 2021-07-31 ENCOUNTER — Encounter: Payer: Self-pay | Admitting: Hematology and Oncology

## 2021-07-31 ENCOUNTER — Other Ambulatory Visit: Payer: Self-pay

## 2021-07-31 ENCOUNTER — Ambulatory Visit
Admission: RE | Admit: 2021-07-31 | Discharge: 2021-07-31 | Disposition: A | Discharge status: Home / Self Care | Payer: Medicare Other | Source: Ambulatory Visit | Attending: Radiation Oncology | Admitting: Radiation Oncology

## 2021-07-31 DIAGNOSIS — C7931 Secondary malignant neoplasm of brain: Secondary | ICD-10-CM | POA: Diagnosis not present

## 2021-08-01 ENCOUNTER — Ambulatory Visit
Admission: RE | Admit: 2021-08-01 | Discharge: 2021-08-01 | Disposition: A | Discharge status: Home / Self Care | Payer: Medicare Other | Source: Ambulatory Visit | Attending: Radiation Oncology | Admitting: Radiation Oncology

## 2021-08-01 DIAGNOSIS — C7931 Secondary malignant neoplasm of brain: Secondary | ICD-10-CM | POA: Diagnosis not present

## 2021-08-02 ENCOUNTER — Other Ambulatory Visit: Payer: Self-pay

## 2021-08-02 ENCOUNTER — Other Ambulatory Visit (HOSPITAL_COMMUNITY): Payer: Self-pay

## 2021-08-02 ENCOUNTER — Encounter: Payer: Self-pay | Admitting: Hematology and Oncology

## 2021-08-02 ENCOUNTER — Telehealth: Payer: Self-pay

## 2021-08-02 ENCOUNTER — Ambulatory Visit
Admission: RE | Admit: 2021-08-02 | Discharge: 2021-08-02 | Disposition: A | Discharge status: Home / Self Care | Payer: Medicare Other | Source: Ambulatory Visit | Attending: Radiation Oncology | Admitting: Radiation Oncology

## 2021-08-02 DIAGNOSIS — C7931 Secondary malignant neoplasm of brain: Secondary | ICD-10-CM | POA: Diagnosis not present

## 2021-08-02 MED ORDER — MAGIC MOUTHWASH W/LIDOCAINE: 5.0000 mL | Freq: Four times a day (QID) | ORAL | 0 refills | Status: DC

## 2021-08-02 MED ORDER — NYSTATIN 100000 UNIT/ML MT SUSP
OROMUCOSAL | 0 refills | Status: DC
  Filled 2021-08-02: qty 240, 12d supply, fill #0

## 2021-08-02 NOTE — Telephone Encounter (Signed)
Called Rx into [facility] outpatient pharmacy. ? ?Called [facility] and given below message she verbalized understanding and will pick up Rx. ?

## 2021-08-02 NOTE — Telephone Encounter (Signed)
Returned her call. She is asking if she should continue taking Magnesium? She needs a Rx if she should continue. ? ?She is complaining of a sore mouth x 5 days with red spots. She has tried gargling with warm baking soda and it is not helping. ?She is asking for Rx. ?

## 2021-08-02 NOTE — Telephone Encounter (Signed)
No need magnesium refill ?Please call in MMW with lidocaine swish and spit 4x daily ?

## 2021-08-03 ENCOUNTER — Ambulatory Visit
Admission: RE | Admit: 2021-08-03 | Discharge: 2021-08-03 | Disposition: A | Discharge status: Home / Self Care | Payer: Medicare Other | Source: Ambulatory Visit | Attending: Radiation Oncology | Admitting: Radiation Oncology

## 2021-08-03 DIAGNOSIS — C7931 Secondary malignant neoplasm of brain: Secondary | ICD-10-CM | POA: Diagnosis not present

## 2021-08-06 ENCOUNTER — Other Ambulatory Visit: Payer: Self-pay

## 2021-08-06 ENCOUNTER — Ambulatory Visit
Admission: RE | Admit: 2021-08-06 | Discharge: 2021-08-06 | Disposition: A | Discharge status: Home / Self Care | Payer: Medicare Other | Source: Ambulatory Visit | Attending: Radiation Oncology | Admitting: Radiation Oncology

## 2021-08-06 DIAGNOSIS — C7931 Secondary malignant neoplasm of brain: Secondary | ICD-10-CM | POA: Diagnosis not present

## 2021-08-07 ENCOUNTER — Ambulatory Visit
Admission: RE | Admit: 2021-08-07 | Discharge: 2021-08-07 | Disposition: A | Discharge status: Home / Self Care | Payer: Medicare Other | Source: Ambulatory Visit | Attending: Radiation Oncology | Admitting: Radiation Oncology

## 2021-08-07 DIAGNOSIS — C7931 Secondary malignant neoplasm of brain: Secondary | ICD-10-CM | POA: Diagnosis not present

## 2021-08-08 ENCOUNTER — Ambulatory Visit
Admission: RE | Admit: 2021-08-08 | Discharge: 2021-08-08 | Disposition: A | Discharge status: Home / Self Care | Payer: Medicare Other | Source: Ambulatory Visit | Attending: Radiation Oncology | Admitting: Radiation Oncology

## 2021-08-08 ENCOUNTER — Other Ambulatory Visit: Payer: Self-pay

## 2021-08-08 DIAGNOSIS — C7931 Secondary malignant neoplasm of brain: Secondary | ICD-10-CM | POA: Diagnosis not present

## 2021-08-09 ENCOUNTER — Ambulatory Visit
Admission: RE | Admit: 2021-08-09 | Discharge: 2021-08-09 | Disposition: A | Discharge status: Home / Self Care | Payer: Medicare Other | Source: Ambulatory Visit | Attending: Radiation Oncology | Admitting: Radiation Oncology

## 2021-08-09 DIAGNOSIS — C7931 Secondary malignant neoplasm of brain: Secondary | ICD-10-CM | POA: Diagnosis not present

## 2021-08-10 ENCOUNTER — Other Ambulatory Visit: Payer: Self-pay

## 2021-08-10 ENCOUNTER — Ambulatory Visit
Admission: RE | Admit: 2021-08-10 | Discharge: 2021-08-10 | Disposition: A | Discharge status: Home / Self Care | Payer: Medicare Other | Source: Ambulatory Visit | Attending: Radiation Oncology | Admitting: Radiation Oncology

## 2021-08-10 DIAGNOSIS — C7931 Secondary malignant neoplasm of brain: Secondary | ICD-10-CM | POA: Diagnosis not present

## 2021-08-13 ENCOUNTER — Other Ambulatory Visit: Payer: Self-pay

## 2021-08-13 ENCOUNTER — Inpatient Hospital Stay: Payer: Medicare Other

## 2021-08-13 ENCOUNTER — Ambulatory Visit
Admission: RE | Admit: 2021-08-13 | Discharge: 2021-08-13 | Disposition: A | Discharge status: Home / Self Care | Payer: Medicare Other | Source: Ambulatory Visit | Attending: Radiation Oncology | Admitting: Radiation Oncology

## 2021-08-13 ENCOUNTER — Encounter: Payer: Self-pay | Admitting: Urology

## 2021-08-13 ENCOUNTER — Inpatient Hospital Stay: Payer: Medicare Other | Admitting: Hematology and Oncology

## 2021-08-13 DIAGNOSIS — I700 Atherosclerosis of aorta: Secondary | ICD-10-CM | POA: Insufficient documentation

## 2021-08-13 DIAGNOSIS — D539 Nutritional anemia, unspecified: Secondary | ICD-10-CM | POA: Insufficient documentation

## 2021-08-13 DIAGNOSIS — C7931 Secondary malignant neoplasm of brain: Secondary | ICD-10-CM

## 2021-08-13 DIAGNOSIS — Z79899 Other long term (current) drug therapy: Secondary | ICD-10-CM | POA: Insufficient documentation

## 2021-08-13 DIAGNOSIS — C549 Malignant neoplasm of corpus uteri, unspecified: Secondary | ICD-10-CM

## 2021-08-13 DIAGNOSIS — I10 Essential (primary) hypertension: Secondary | ICD-10-CM

## 2021-08-13 DIAGNOSIS — Z5111 Encounter for antineoplastic chemotherapy: Secondary | ICD-10-CM | POA: Insufficient documentation

## 2021-08-13 DIAGNOSIS — C55 Malignant neoplasm of uterus, part unspecified: Secondary | ICD-10-CM | POA: Insufficient documentation

## 2021-08-13 DIAGNOSIS — D569 Thalassemia, unspecified: Secondary | ICD-10-CM | POA: Insufficient documentation

## 2021-08-13 LAB — CMP (CANCER CENTER ONLY)
ALT: 6 U/L (ref 0–44)
AST: 12 U/L — ABNORMAL LOW (ref 15–41)
Albumin: 4 g/dL (ref 3.5–5.0)
Alkaline Phosphatase: 49 U/L (ref 38–126)
Anion gap: 7 (ref 5–15)
BUN: 28 mg/dL — ABNORMAL HIGH (ref 8–23)
CO2: 23 mmol/L (ref 22–32)
Calcium: 9.3 mg/dL (ref 8.9–10.3)
Chloride: 107 mmol/L (ref 98–111)
Creatinine: 1.18 mg/dL — ABNORMAL HIGH (ref 0.44–1.00)
GFR, Estimated: 48 mL/min — ABNORMAL LOW (ref >60)
Glucose, Bld: 101 mg/dL — ABNORMAL HIGH (ref 70–99)
Potassium: 3.7 mmol/L (ref 3.5–5.1)
Sodium: 137 mmol/L (ref 135–145)
Total Bilirubin: 0.4 mg/dL (ref 0.3–1.2)
Total Protein: 6.7 g/dL (ref 6.5–8.1)

## 2021-08-13 LAB — CBC WITH DIFFERENTIAL (CANCER CENTER ONLY)
Abs Immature Granulocytes: 0.1 10*3/uL — ABNORMAL HIGH (ref 0.00–0.07)
Basophils Absolute: 0.1 10*3/uL (ref 0.0–0.1)
Basophils Relative: 1 %
Eosinophils Absolute: 0.1 10*3/uL (ref 0.0–0.5)
Eosinophils Relative: 2 %
HCT: 27.6 % — ABNORMAL LOW (ref 36.0–46.0)
Hemoglobin: 8.7 g/dL — ABNORMAL LOW (ref 12.0–15.0)
Immature Granulocytes: 2 %
Lymphocytes Relative: 20 %
Lymphs Abs: 0.9 10*3/uL (ref 0.7–4.0)
MCH: 22.5 pg — ABNORMAL LOW (ref 26.0–34.0)
MCHC: 31.5 g/dL (ref 30.0–36.0)
MCV: 71.3 fL — ABNORMAL LOW (ref 80.0–100.0)
Monocytes Absolute: 0.7 10*3/uL (ref 0.1–1.0)
Monocytes Relative: 15 %
Neutro Abs: 2.7 10*3/uL (ref 1.7–7.7)
Neutrophils Relative %: 60 %
Platelet Count: 274 10*3/uL (ref 150–400)
RBC: 3.87 MIL/uL (ref 3.87–5.11)
RDW: 17.1 % — ABNORMAL HIGH (ref 11.5–15.5)
WBC Count: 4.4 10*3/uL (ref 4.0–10.5)
nRBC: 0 % (ref 0.0–0.2)

## 2021-08-13 LAB — TSH: TSH: 1.324 u[IU]/mL (ref 0.308–3.960)

## 2021-08-13 LAB — TOTAL PROTEIN, URINE DIPSTICK: Protein, ur: NEGATIVE mg/dL

## 2021-08-13 MED ORDER — SODIUM CHLORIDE 0.9% FLUSH
10.0000 mL | Freq: Once | INTRAVENOUS | Status: AC
  Administered 2021-08-13: 10 mL

## 2021-08-13 MED ORDER — SODIUM CHLORIDE 0.9 % IV SOLN
10.0000 mg | Freq: Once | INTRAVENOUS | Status: AC
  Administered 2021-08-13: 10 mg via INTRAVENOUS
  Filled 2021-08-13: qty 10

## 2021-08-13 MED ORDER — DOXORUBICIN HCL LIPOSOMAL CHEMO INJECTION 2 MG/ML
40.5000 mg/m2 | Freq: Once | INTRAVENOUS | Status: AC
  Administered 2021-08-13: 60 mg via INTRAVENOUS
  Filled 2021-08-13: qty 30

## 2021-08-13 MED ORDER — DEXTROSE 5 % IV SOLN: Freq: Once | INTRAVENOUS | Status: AC

## 2021-08-13 NOTE — Patient Instructions (Signed)
[facility]  Discharge Instructions: ?Thank you for choosing [facility] to provide your oncology and hematology care.  ? ?If you have a lab appointment with the [facility], please go directly to the [facility] and check in at the registration area. ?  ?Wear comfortable clothing and clothing appropriate for easy access to any Portacath or PICC line.  ? ?We strive to give you quality time with your provider. You may need to reschedule your appointment if you arrive late (15 or more minutes).  Arriving late affects you and other patients whose appointments are after yours.  Also, if you miss three or more appointments without notifying the office, you may be dismissed from the clinic at the provider?s discretion.    ?  ?For prescription refill requests, have your pharmacy contact our office and allow 72 hours for refills to be completed.   ? ?Today you received the following chemotherapy and/or immunotherapy agents: Doxil    ?  ?To help prevent nausea and vomiting after your treatment, we encourage you to take your nausea medication as directed. ? ?BELOW ARE SYMPTOMS THAT SHOULD BE REPORTED IMMEDIATELY: ?*FEVER GREATER THAN 100.4 F (38 ?C) OR HIGHER ?*CHILLS OR SWEATING ?*NAUSEA AND VOMITING THAT IS NOT CONTROLLED WITH YOUR NAUSEA MEDICATION ?*UNUSUAL SHORTNESS OF BREATH ?*UNUSUAL BRUISING OR BLEEDING ?*URINARY PROBLEMS (pain or burning when urinating, or frequent urination) ?*BOWEL PROBLEMS (unusual diarrhea, constipation, pain near the anus) ?TENDERNESS IN MOUTH AND THROAT WITH OR WITHOUT PRESENCE OF ULCERS (sore throat, sores in mouth, or a toothache) ?UNUSUAL RASH, SWELLING OR PAIN  ?UNUSUAL VAGINAL DISCHARGE OR ITCHING  ? ?Items with * indicate a potential emergency and should be followed up as soon as possible or go to the Emergency Department if any problems should occur. ? ?Please show the CHEMOTHERAPY ALERT CARD or IMMUNOTHERAPY ALERT CARD at check-in to the  Emergency Department and triage nurse. ? ?Should you have questions after your visit or need to cancel or reschedule your appointment, please contact [facility]  Dept: [phone]  and follow the prompts.  Office hours are 8:00 a.m. to 4:30 p.m. Monday - Friday. Please note that voicemails left after 4:00 p.m. may not be returned until the following business day.  We are closed weekends and major holidays. You have access to a nurse at all times for urgent questions. Please call the main number to the clinic Dept: [phone] and follow the prompts. ? ? ?For any non-urgent questions, you may also contact your provider using MyChart. We now offer e-Visits for anyone 32 and older to request care online for non-urgent symptoms. For details visit [email or web address]. ?  ?Also download the MyChart app! Go to the app store, search "MyChart", open the app, select [facility], and log in with your MyChart username and password. ? ?Due to Covid, a mask is required upon entering the hospital/clinic. If you do not have a mask, one will be given to you upon arrival. For doctor visits, patients may have 1 support person aged 25 or older with them. For treatment visits, patients cannot have anyone with them due to current Covid guidelines and our immunocompromised population.  ? ?Doxorubicin Liposomal injection ?What is this medication? ?LIPOSOMAL DOXORUBICIN (LIP oh som al  dox oh ROO bi sin) is a chemotherapy drug. This medicine is used to treat many kinds of cancer like Kaposi's sarcoma, multiple myeloma, and ovarian cancer. ?This medicine may be used for other purposes;  ask your health care provider or pharmacist if you have questions. ?COMMON BRAND NAME(S): Doxil, Lipodox ?What should I tell my care team before I take this medication? ?They need to know if you have any of these conditions: ?blood disorders ?heart disease ?infection (especially a virus infection such as chickenpox, cold  sores, or herpes) ?liver disease ?recent or ongoing radiation therapy ?an unusual or allergic reaction to doxorubicin, other chemotherapy agents, soybeans, other medicines, foods, dyes, or preservatives ?pregnant or trying to get pregnant ?breast-feeding ?How should I use this medication? ?This drug is given as an infusion into a vein. It is administered in a hospital or clinic by a specially trained health care professional. If you have pain, swelling, burning or any unusual feeling around the site of your injection, tell your health care professional right away. ?Talk to your pediatrician regarding the use of this medicine in children. Special care may be needed. ?Overdosage: If you think you have taken too much of this medicine contact a poison control center or emergency room at once. ?NOTE: This medicine is only for you. Do not share this medicine with others. ?What if I miss a dose? ?It is important not to miss your dose. Call your doctor or health care professional if you are unable to keep an appointment. ?What may interact with this medication? ?Do not take this medicine with any of the following medications: ?zidovudine ?This medicine may also interact with the following medications: ?medicines to increase blood counts like filgrastim, pegfilgrastim, sargramostim ?vaccines ?Talk to your doctor or health care professional before taking any of these medicines: ?acetaminophen ?aspirin ?ibuprofen ?ketoprofen ?naproxen ?This list may not describe all possible interactions. Give your health care provider a list of all the medicines, herbs, non-prescription drugs, or dietary supplements you use. Also tell them if you smoke, drink alcohol, or use illegal drugs. Some items may interact with your medicine. ?What should I watch for while using this medication? ?Your condition will be monitored carefully while you are receiving this medicine. You may need blood work done while you are taking this medicine. ?This drug  may make you feel generally unwell. This is not uncommon, as chemotherapy can affect healthy cells as well as cancer cells. Report any side effects. Continue your course of treatment even though you feel ill unless your doctor tells you to stop. ?Your urine may turn orange-red for a few days after your dose. This is not blood. If your urine is dark or brown, call your doctor. ?In some cases, you may be given additional medicines to help with side effects. Follow all directions for their use. ?Talk to your doctor about your risk of cancer. You may be more at risk for certain types of cancers if you take this medicine. ?Do not become pregnant while taking this medicine or for 6 months after stopping it. Women should inform their healthcare professional if they wish to become pregnant or think they may be pregnant. Men should not father a child while taking this medicine and for 6 months after stopping it. There is a potential for serious side effects to an unborn child. Talk to your health care professional or pharmacist for more information. Do not breast-feed an infant while taking this medicine. ?This medicine has caused ovarian failure in some women. This medicine may make it more difficult to get pregnant. Talk to your healthcare professional if you are concerned about your fertility. ?This medicine has caused decreased sperm counts in some men. This may make  it more difficult to father a child. Talk to your healthcare professional if you are concerned about your fertility. ?This medicine may cause a decrease in Co-Enzyme Q-10. You should make sure that you get enough Co-Enzyme Q-10 while you are taking this medicine. Discuss the foods you eat and the vitamins you take with your health care professional. ?What side effects may I notice from receiving this medication? ?Side effects that you should report to your doctor or health care professional as soon as possible: ?allergic reactions like skin rash, itching or  hives, swelling of the face, lips, or tongue ?low blood counts - this medicine may decrease the number of white blood cells, red blood cells and platelets. You may be at increased risk for infections an

## 2021-08-14 ENCOUNTER — Encounter: Payer: Self-pay | Admitting: Hematology and Oncology

## 2021-08-14 LAB — T4: T4, Total: 9.3 ug/dL (ref 4.5–12.0)

## 2021-08-14 NOTE — Assessment & Plan Note (Signed)
She has multifactorial anemia, combination of thalassemia and recent anemia chronic illness ?Her blood count has stabilized since we switch her treatment ?Monitor closely ?She denies recent bleeding ?

## 2021-08-14 NOTE — Progress Notes (Signed)
[facility] ?OFFICE PROGRESS NOTE ? ?Patient Care Team: ?[facility] as PCP - General (Rheumatology) ?[name], RN as Oncology Nurse Navigator (Oncology) ? ?ASSESSMENT & PLAN:  ?Uterine cancer ([place]) ?So far, she tolerated treatment well without major side effects ?Her blood pressure is now starting to trend down due to discontinuation of Lenvima ?We will monitor closely ?I recommend minimum 3 cycles of treatment before repeat imaging study ? ?Deficiency anemia ?She has multifactorial anemia, combination of thalassemia and recent anemia chronic illness ?Her blood count has stabilized since we switch her treatment ?Monitor closely ?She denies recent bleeding ? ?Essential hypertension ?Her blood pressure is now trending down ?I recommend gentle taper of her antihypertensives ?She is in agreement ? ?No orders of the defined types were placed in this encounter. ? ? ?All questions were answered. The patient knows to call the clinic with any problems, questions or concerns. ?The total time spent in the appointment was 20 minutes encounter with patients including review of chart and various tests results, discussions about plan of care and coordination of care plan ?  ?[name], MD ?08/14/2021 10:36 AM ? ?INTERVAL HISTORY: ?Please see below for problem oriented charting. ?she returns for treatment follow-up with family ?She tolerated last cycle of treatment well ?She showed me documented blood pressure from home, in general, her systolic blood pressure is low ?She is not symptomatic ?No new neurological deficit ?No recent bleeding ? ?REVIEW OF SYSTEMS:   ?Constitutional: Denies fevers, chills or abnormal weight loss ?Eyes: Denies blurriness of vision ?Ears, nose, mouth, throat, and face: Denies mucositis or sore throat ?Respiratory: Denies cough, dyspnea or wheezes ?Cardiovascular: Denies palpitation, chest discomfort or lower extremity swelling ?Gastrointestinal:  Denies nausea,  heartburn or change in bowel habits ?Skin: Denies abnormal skin rashes ?Lymphatics: Denies new lymphadenopathy or easy bruising ?Neurological:Denies numbness, tingling or new weaknesses ?Behavioral/Psych: Mood is stable, no new changes  ?All other systems were reviewed with the patient and are negative. ? ?I have reviewed the past medical history, past surgical history, social history and family history with the patient and they are unchanged from previous note. ? ?ALLERGIES:  has No Known Allergies. ? ?MEDICATIONS:  ?Current Outpatient Medications  ?Medication Sig Dispense Refill  ? magic mouthwash w/lidocaine SOLN Take 5 mLs by mouth 4 (four) times daily. 5 ml QID, swish and spit 240 mL 0  ? amLODipine (NORVASC) 10 MG tablet Take 1 tablet (10 mg total) by mouth daily. 90 tablet 2  ? atenolol (TENORMIN) 50 MG tablet Take 1 tablet (50 mg total) by mouth daily. 30 tablet 1  ? cholecalciferol (VITAMIN D3) 25 MCG (1000 UNIT) tablet Take 1,000 Units by mouth daily.    ? doxazosin (CARDURA) 4 MG tablet TAKE 1 TABLET(4 MG) BY MOUTH DAILY 30 tablet 3  ? lidocaine-prilocaine (EMLA) cream Apply 1 application topically daily as needed. 30 g 3  ? LORazepam (ATIVAN) 0.5 MG tablet TAKE 1 TABLET(0.5 MG) BY MOUTH TWICE DAILY AS NEEDED FOR ANXIETY (Patient taking differently: Take 0.5 mg by mouth 2 (two) times daily as needed for anxiety.) 60 tablet 0  ? magic mouthwash (nystatin, lidocaine, diphenhydrAMINE, alum & mag hydroxide) suspension Swish 5 mL in the mouth and spit out  4 times daily . 240 mL 0  ? ondansetron (ZOFRAN) 8 MG tablet Take 1 tablet (8 mg total) by mouth every 8 (eight) hours as needed for nausea. 30 tablet 3  ? prochlorperazine (COMPAZINE) 10 MG tablet Take 1 tablet (  10 mg total) by mouth every 6 (six) hours as needed for nausea or vomiting. 30 tablet 1  ? ?No current facility-administered medications for this visit.  ? ? ?SUMMARY OF ONCOLOGIC HISTORY: ?Oncology History Overview Note  ?High grade serous ?MMR  normal ?Her2 negative ?Progressed on carboplatin, taxol, pembrolizumab and lenvima ?  ?Uterine cancer ([facility])  ?08/28/2020 Imaging  ? 1. Pelvic mass measuring 7.5 x 4.8 cm, contiguous with the sigmoid colon and uterus with loss of fat planes. Site of origin is unclear, may be ovarian, uterine, or colonic. Lack of contrast limits detailed assessment. ?2. Above pelvic mass causes partial obstruction of the left ureter with mild left hydroureteronephrosis. ?3. Multifocal adenopathy in the abdomen and pelvis, suspicious for metastatic disease, primarily in the mesentery and lower peritoneum and pelvis. Please note that detailed assessment of adenopathy is limited on this noncontrast exam. Recommend oncology referral.  ?4. Wall thickening of the sigmoid colon distal to the left pelvic mass with pericolonic edema, suspicious for colitis. There is a moderate to large amount of stool in the more proximal colon, suggesting pelvic mass may be causing delayed colonic transit. ?5. Small to moderate hiatal hernia. ?  ?08/28/2020 - 09/04/2020 Hospital Admission  ? The patient has been complaining of reduced urine output, loss of appetite and changes in bowel habits ?She had abnormal imaging study leading to hospitalization, colonoscopy, biopsy and subsequently chemotherapy ?  ?08/30/2020 Procedure  ? CT-guided biopsy of enlarged left external iliac chain lymph node as above ?  ?08/30/2020 Pathology Results  ? SURGICAL PATHOLOGY  ?CASE: [id number]  ?PATIENT: Teresa Oliver  ?Surgical Pathology Report  ?Clinical History: Pelvic mass with metastatic lymph adenopathy ([facility])  ?FINAL MICROSCOPIC DIAGNOSIS:  ? ?A. LYMPH NODE, LEFT EXTERNAL ILIAC CHAIN, BIOPSY:  ?- Poorly differentiated carcinoma with necrosis.  ?- See comment.  ? ?COMMENT:  ?The carcinoma is characterized by diffuse sheets of malignant epithelioid cells with prominent nucleoli and frequent mitotic figures. Immunohistochemistry is positive with cytokeratin 7 and PAX 8 with  weak  ?positivity with estrogen receptor and WT1.  P53 shows diffuse strong nuclear positivity.  The carcinoma is negative with cytokeratin 20, CDX2, GATA3, GCDFP, TTF-1, HepPar 1, arginase 1 and CD10.  The  ?morphology and immunophenotype are most consistent with a gynecologic primary including high-grade serous carcinoma.  ?  ?08/31/2020 Procedure  ? Colonoscopy ?- Preparation of the colon was fair. ?- Likely malignant partially obstructing tumor in the sigmoid colon (appears extrinsic process). Biopsied. Tattooed. ?- Internal hemorrhoids. ?- Stool in the entire examined colon ?  ?09/01/2020 Initial Diagnosis  ? Uterine cancer [facility]) ?  ?09/01/2020 Procedure  ? Successful placement of a power injectable Port-A-Cath via the right internal jugular vein. The catheter is ready for immediate use. ?  ?09/04/2020 - 12/29/2020 Chemotherapy  ?  Patient is on Treatment Plan: UTERINE CARBOPLATIN AUC 6 / PACLITAXEL Q21D ? ?  ? ?  ?11/15/2020 Imaging  ? 1. Marked interval response to therapy with decrease in size of the mass extending from the uterus to the colon that was seen on the previous examination. Also with decreased size and or resolution of pelvic, mesenteric and retroperitoneal adenopathy seen on previous imaging. ?2. Tethering of the colon with area that raise the question of developing colovaginal fistula versus internal enhancement within the mass that is now decreased in size. Attention on follow-up is suggested. Correlate with any current signs of vaginal drainage. ?3. LEFT thoracic inlet adenopathy compatible with involvement in the chest. Area  not imaged on previous imaging. ?  ?02/18/2021 Imaging  ? 1. Interval progression of left supraclavicular,, left thoracic inlet, left subpectoral, retroperitoneal, mesenteric and pelvic sidewall lymphadenopathy. Metastatic deposits in the central pelvis are also progressive. ?2. Interval progression of left subpectoral lymphadenopathy with thrombus now visible in the left  subclavian/innominate vein. ?3. Aortic Atherosclerosis (ICD10-I70.0). ?  ?02/23/2021 - 06/15/2021 Chemotherapy  ? Patient is on Treatment Plan : UTERINE Lenvatinib + Pembrolizumab q21d  ?   ?05/23/2021 Imaging  ? 1. Decr

## 2021-08-14 NOTE — Assessment & Plan Note (Signed)
So far, she tolerated treatment well without major side effects ?Her blood pressure is now starting to trend down due to discontinuation of Lenvima ?We will monitor closely ?I recommend minimum 3 cycles of treatment before repeat imaging study ?

## 2021-08-14 NOTE — Assessment & Plan Note (Signed)
Her blood pressure is now trending down ?I recommend gentle taper of her antihypertensives ?She is in agreement ?

## 2021-08-17 ENCOUNTER — Other Ambulatory Visit: Payer: Self-pay | Admitting: Hematology and Oncology

## 2021-09-02 ENCOUNTER — Other Ambulatory Visit: Payer: Self-pay | Admitting: Hematology and Oncology

## 2021-09-03 ENCOUNTER — Encounter: Payer: Self-pay | Admitting: Hematology and Oncology

## 2021-09-03 ENCOUNTER — Telehealth: Payer: Self-pay

## 2021-09-03 NOTE — Telephone Encounter (Signed)
Called and given below message. She verbalized understanding. 

## 2021-09-03 NOTE — Telephone Encounter (Signed)
-----   Message from [name], MD sent at 09/03/2021  8:09 AM EDT ----- ?I received refill request from pharmacy on amlodipine ?I declined it because we talked about medicine taper ?Remind her to bring all her pill botlles ? ?

## 2021-09-10 ENCOUNTER — Inpatient Hospital Stay: Payer: Medicare Other

## 2021-09-10 ENCOUNTER — Other Ambulatory Visit: Payer: Self-pay

## 2021-09-10 ENCOUNTER — Other Ambulatory Visit: Payer: Self-pay | Admitting: Hematology and Oncology

## 2021-09-10 ENCOUNTER — Inpatient Hospital Stay: Payer: Medicare Other | Admitting: Hematology and Oncology

## 2021-09-10 ENCOUNTER — Encounter: Payer: Self-pay | Admitting: Hematology and Oncology

## 2021-09-10 ENCOUNTER — Inpatient Hospital Stay: Payer: Medicare Other | Attending: Hematology and Oncology

## 2021-09-10 VITALS — BP 139/76 | HR 88 | Temp 98.6°F | Resp 18 | Ht 59.0 in | Wt 112.2 lb

## 2021-09-10 DIAGNOSIS — C549 Malignant neoplasm of corpus uteri, unspecified: Secondary | ICD-10-CM

## 2021-09-10 DIAGNOSIS — Z79899 Other long term (current) drug therapy: Secondary | ICD-10-CM | POA: Insufficient documentation

## 2021-09-10 DIAGNOSIS — D539 Nutritional anemia, unspecified: Secondary | ICD-10-CM

## 2021-09-10 DIAGNOSIS — I129 Hypertensive chronic kidney disease with stage 1 through stage 4 chronic kidney disease, or unspecified chronic kidney disease: Secondary | ICD-10-CM | POA: Insufficient documentation

## 2021-09-10 DIAGNOSIS — C7931 Secondary malignant neoplasm of brain: Secondary | ICD-10-CM | POA: Insufficient documentation

## 2021-09-10 DIAGNOSIS — N183 Chronic kidney disease, stage 3 unspecified: Secondary | ICD-10-CM | POA: Diagnosis not present

## 2021-09-10 DIAGNOSIS — Z5111 Encounter for antineoplastic chemotherapy: Secondary | ICD-10-CM | POA: Insufficient documentation

## 2021-09-10 DIAGNOSIS — I10 Essential (primary) hypertension: Secondary | ICD-10-CM | POA: Diagnosis not present

## 2021-09-10 DIAGNOSIS — C55 Malignant neoplasm of uterus, part unspecified: Secondary | ICD-10-CM | POA: Insufficient documentation

## 2021-09-10 LAB — CBC WITH DIFFERENTIAL (CANCER CENTER ONLY)
Abs Immature Granulocytes: 0.03 10*3/uL (ref 0.00–0.07)
Basophils Absolute: 0 10*3/uL (ref 0.0–0.1)
Basophils Relative: 1 %
Eosinophils Absolute: 0.1 10*3/uL (ref 0.0–0.5)
Eosinophils Relative: 2 %
HCT: 26.5 % — ABNORMAL LOW (ref 36.0–46.0)
Hemoglobin: 8.4 g/dL — ABNORMAL LOW (ref 12.0–15.0)
Immature Granulocytes: 1 %
Lymphocytes Relative: 23 %
Lymphs Abs: 0.8 10*3/uL (ref 0.7–4.0)
MCH: 23 pg — ABNORMAL LOW (ref 26.0–34.0)
MCHC: 31.7 g/dL (ref 30.0–36.0)
MCV: 72.6 fL — ABNORMAL LOW (ref 80.0–100.0)
Monocytes Absolute: 0.5 10*3/uL (ref 0.1–1.0)
Monocytes Relative: 16 %
Neutro Abs: 2 10*3/uL (ref 1.7–7.7)
Neutrophils Relative %: 57 %
Platelet Count: 278 10*3/uL (ref 150–400)
RBC: 3.65 MIL/uL — ABNORMAL LOW (ref 3.87–5.11)
RDW: 16.7 % — ABNORMAL HIGH (ref 11.5–15.5)
WBC Count: 3.4 10*3/uL — ABNORMAL LOW (ref 4.0–10.5)
nRBC: 0 % (ref 0.0–0.2)

## 2021-09-10 LAB — CMP (CANCER CENTER ONLY)
ALT: 6 U/L (ref 0–44)
AST: 13 U/L — ABNORMAL LOW (ref 15–41)
Albumin: 4 g/dL (ref 3.5–5.0)
Alkaline Phosphatase: 39 U/L (ref 38–126)
Anion gap: 7 (ref 5–15)
BUN: 33 mg/dL — ABNORMAL HIGH (ref 8–23)
CO2: 24 mmol/L (ref 22–32)
Calcium: 9.4 mg/dL (ref 8.9–10.3)
Chloride: 107 mmol/L (ref 98–111)
Creatinine: 1.36 mg/dL — ABNORMAL HIGH (ref 0.44–1.00)
GFR, Estimated: 41 mL/min — ABNORMAL LOW (ref >60)
Glucose, Bld: 113 mg/dL — ABNORMAL HIGH (ref 70–99)
Potassium: 3.8 mmol/L (ref 3.5–5.1)
Sodium: 138 mmol/L (ref 135–145)
Total Bilirubin: 0.3 mg/dL (ref 0.3–1.2)
Total Protein: 6.6 g/dL (ref 6.5–8.1)

## 2021-09-10 LAB — TSH: TSH: 1.643 u[IU]/mL (ref 0.350–4.500)

## 2021-09-10 LAB — TOTAL PROTEIN, URINE DIPSTICK: Protein, ur: 30 mg/dL — AB

## 2021-09-10 MED ORDER — ATENOLOL 50 MG PO TABS: 25.0000 mg | ORAL_TABLET | Freq: Every day | ORAL | 0 refills | Status: DC

## 2021-09-10 MED ORDER — ONDANSETRON HCL 8 MG PO TABS: 8.0000 mg | ORAL_TABLET | Freq: Three times a day (TID) | ORAL | 3 refills | Status: AC | PRN

## 2021-09-10 MED ORDER — DOXORUBICIN HCL LIPOSOMAL CHEMO INJECTION 2 MG/ML
40.5000 mg/m2 | Freq: Once | INTRAVENOUS | Status: AC
  Administered 2021-09-10: 60 mg via INTRAVENOUS
  Filled 2021-09-10: qty 30

## 2021-09-10 MED ORDER — HEPARIN SOD (PORK) LOCK FLUSH 100 UNIT/ML IV SOLN
500.0000 [IU] | Freq: Once | INTRAVENOUS | Status: AC | PRN
  Administered 2021-09-10: 500 [IU]

## 2021-09-10 MED ORDER — SODIUM CHLORIDE 0.9 % IV SOLN
10.0000 mg | Freq: Once | INTRAVENOUS | Status: AC
  Administered 2021-09-10: 10 mg via INTRAVENOUS
  Filled 2021-09-10: qty 10

## 2021-09-10 MED ORDER — SODIUM CHLORIDE 0.9% FLUSH
10.0000 mL | INTRAVENOUS | Status: DC | PRN
  Administered 2021-09-10: 10 mL

## 2021-09-10 MED ORDER — DEXTROSE 5 % IV SOLN: Freq: Once | INTRAVENOUS | Status: AC

## 2021-09-10 MED ORDER — SODIUM CHLORIDE 0.9% FLUSH
10.0000 mL | Freq: Once | INTRAVENOUS | Status: AC
  Administered 2021-09-10: 10 mL

## 2021-09-10 NOTE — Assessment & Plan Note (Signed)
She has multifactorial anemia, combination of thalassemia and recent anemia chronic illness ?Her blood count has stabilized since we switch her treatment ?Monitor closely ?She denies recent bleeding ?

## 2021-09-10 NOTE — Progress Notes (Signed)
[facility] ?OFFICE PROGRESS NOTE ? ?Patient Care Team: ?[facility], Owen as PCP - General (Rheumatology) ?[name], RN as Oncology Nurse Navigator (Oncology) ? ?ASSESSMENT & PLAN:  ?Uterine cancer ([place]) ?So far, she tolerated treatment well without major side effects ?Her blood pressure is now starting to trend down due to discontinuation of Lenvima ?We will monitor closely ?Plan to repeat imaging study next month for further follow-up and objective assessment of response to treatment ? ?Deficiency anemia ?She has multifactorial anemia, combination of thalassemia and recent anemia chronic illness ?Her blood count has stabilized since we switch her treatment ?Monitor closely ?She denies recent bleeding ? ?CKD (chronic kidney disease), stage III ([place]) ?She has chronic kidney disease, multifactorial, related to aging, hypertension and history of hydronephrosis ?Observe closely ? ?Essential hypertension ?Her blood pressure continues to trend down ?We have discontinue amlodipine recently ?I will continue to taper her off Cardura and atenolol ? ?Orders Placed This Encounter  ?Procedures  ? CT CHEST ABDOMEN PELVIS W CONTRAST  ?  Standing Status:   Future  ?  Standing Expiration Date:   09/11/2022  ?  Order Specific Question:   Preferred imaging location?  ?  Answer:   [facility]  ?  Order Specific Question:   Radiology Contrast Protocol - do NOT remove file path  ?  Answer:   [email or web address]  ? ? ?All questions were answered. The patient knows to call the clinic with any problems, questions or concerns. ?The total time spent in the appointment was 30 minutes encounter with patients including review of chart and various tests results, discussions about plan of care and coordination of care plan ?  ?[name], MD ?09/10/2021 1:09 PM ? ?INTERVAL HISTORY: ?Please see below for problem oriented charting. ?she returns for treatment follow-up  seen prior to cycle 3 of chemo ?Her documented blood pressure from home were within normal range ?She denies abdominal pain or changes in bowel habits ?The patient denies any recent signs or symptoms of bleeding such as spontaneous epistaxis, hematuria or hematochezia. ?Denies new neurological deficits ? ?REVIEW OF SYSTEMS:   ?Constitutional: Denies fevers, chills or abnormal weight loss ?Eyes: Denies blurriness of vision ?Ears, nose, mouth, throat, and face: Denies mucositis or sore throat ?Respiratory: Denies cough, dyspnea or wheezes ?Cardiovascular: Denies palpitation, chest discomfort or lower extremity swelling ?Gastrointestinal:  Denies nausea, heartburn or change in bowel habits ?Skin: Denies abnormal skin rashes ?Lymphatics: Denies new lymphadenopathy or easy bruising ?Neurological:Denies numbness, tingling or new weaknesses ?Behavioral/Psych: Mood is stable, no new changes  ?All other systems were reviewed with the patient and are negative. ? ?I have reviewed the past medical history, past surgical history, social history and family history with the patient and they are unchanged from previous note. ? ?ALLERGIES:  has No Known Allergies. ? ?MEDICATIONS:  ?Current Outpatient Medications  ?Medication Sig Dispense Refill  ? magic mouthwash w/lidocaine SOLN Take 5 mLs by mouth 4 (four) times daily. 5 ml QID, swish and spit 240 mL 0  ? atenolol (TENORMIN) 50 MG tablet Take 0.5 tablets (25 mg total) by mouth daily. 90 tablet 0  ? cholecalciferol (VITAMIN D3) 25 MCG (1000 UNIT) tablet Take 1,000 Units by mouth daily.    ? doxazosin (CARDURA) 4 MG tablet TAKE 1 TABLET(4 MG) BY MOUTH DAILY 30 tablet 3  ? lidocaine-prilocaine (EMLA) cream Apply 1 application topically daily as needed. 30 g 3  ? LORazepam (ATIVAN) 0.5 MG tablet TAKE 1 TABLET(0.5 MG) BY  MOUTH TWICE DAILY AS NEEDED FOR ANXIETY (Patient taking differently: Take 0.5 mg by mouth 2 (two) times daily as needed for anxiety.) 60 tablet 0  ? magic mouthwash  (nystatin, lidocaine, diphenhydrAMINE, alum & mag hydroxide) suspension Swish 5 mL in the mouth and spit out  4 times daily . 240 mL 0  ? ondansetron (ZOFRAN) 8 MG tablet Take 1 tablet (8 mg total) by mouth every 8 (eight) hours as needed for nausea. 30 tablet 3  ? prochlorperazine (COMPAZINE) 10 MG tablet Take 1 tablet (10 mg total) by mouth every 6 (six) hours as needed for nausea or vomiting. 30 tablet 1  ? ?No current facility-administered medications for this visit.  ? ?Facility-Administered Medications Ordered in Other Visits  ?Medication Dose Route Frequency Provider Last Rate Last Admin  ? DOXOrubicin HCL LIPOSOMAL (DOXIL) 60 mg in dextrose 5 % 250 mL chemo infusion  40.5 mg/m2 (Treatment Plan Recorded) Intravenous Once Alvy Bimler, Lincoln Ginley, MD      ? heparin lock flush 100 unit/mL  500 Units Intracatheter Once PRN Alvy Bimler, Miryah Ralls, MD      ? sodium chloride flush (NS) 0.9 % injection 10 mL  10 mL Intracatheter PRN [name], MD      ? ? ?SUMMARY OF ONCOLOGIC HISTORY: ?Oncology History Overview Note  ?High grade serous ?MMR normal ?Her2 negative ?Progressed on carboplatin, taxol, pembrolizumab and lenvima ?  ?Uterine cancer ([place])  ?08/28/2020 Imaging  ? 1. Pelvic mass measuring 7.5 x 4.8 cm, contiguous with the sigmoid colon and uterus with loss of fat planes. Site of origin is unclear, may be ovarian, uterine, or colonic. Lack of contrast limits detailed assessment. ?2. Above pelvic mass causes partial obstruction of the left ureter with mild left hydroureteronephrosis. ?3. Multifocal adenopathy in the abdomen and pelvis, suspicious for metastatic disease, primarily in the mesentery and lower peritoneum and pelvis. Please note that detailed assessment of adenopathy is limited on this noncontrast exam. Recommend oncology referral.  ?4. Wall thickening of the sigmoid colon distal to the left pelvic mass with pericolonic edema, suspicious for colitis. There is a moderate to large amount of stool in the more proximal colon,  suggesting pelvic mass may be causing delayed colonic transit. ?5. Small to moderate hiatal hernia. ?  ?08/28/2020 - 09/04/2020 Hospital Admission  ? The patient has been complaining of reduced urine output, loss of appetite and changes in bowel habits ?She had abnormal imaging study leading to hospitalization, colonoscopy, biopsy and subsequently chemotherapy ?  ?08/30/2020 Procedure  ? CT-guided biopsy of enlarged left external iliac chain lymph node as above ?  ?08/30/2020 Pathology Results  ? SURGICAL PATHOLOGY  ?CASE: [id number]  ?PATIENT: Teresa Oliver  ?Surgical Pathology Report  ?Clinical History: Pelvic mass with metastatic lymph adenopathy ([place])  ?FINAL MICROSCOPIC DIAGNOSIS:  ? ?A. LYMPH NODE, LEFT EXTERNAL ILIAC CHAIN, BIOPSY:  ?- Poorly differentiated carcinoma with necrosis.  ?- See comment.  ? ?COMMENT:  ?The carcinoma is characterized by diffuse sheets of malignant epithelioid cells with prominent nucleoli and frequent mitotic figures. Immunohistochemistry is positive with cytokeratin 7 and PAX 8 with weak  ?positivity with estrogen receptor and WT1.  P53 shows diffuse strong nuclear positivity.  The carcinoma is negative with cytokeratin 20, CDX2, GATA3, GCDFP, TTF-1, HepPar 1, arginase 1 and CD10.  The  ?morphology and immunophenotype are most consistent with a gynecologic primary including high-grade serous carcinoma.  ?  ?08/31/2020 Procedure  ? Colonoscopy ?- Preparation of the colon was fair. ?- Likely malignant partially obstructing  tumor in the sigmoid colon (appears extrinsic process). Biopsied. Tattooed. ?- Internal hemorrhoids. ?- Stool in the entire examined colon ?  ?09/01/2020 Initial Diagnosis  ? Uterine cancer ([place]) ? ?  ?09/01/2020 Procedure  ? Successful placement of a power injectable Port-A-Cath via the right internal jugular vein. The catheter is ready for immediate use. ?  ?09/04/2020 - 12/29/2020 Chemotherapy  ?  Patient is on Treatment Plan: UTERINE CARBOPLATIN AUC 6 / PACLITAXEL  Q21D ? ?  ? ?  ?11/15/2020 Imaging  ? 1. Marked interval response to therapy with decrease in size of the mass extending from the uterus to the colon that was seen on the previous examination. Also with decr

## 2021-09-10 NOTE — Patient Instructions (Signed)
[facility]  Discharge Instructions: ?Thank you for choosing [facility] to provide your oncology and hematology care.  ? ?If you have a lab appointment with the Cancer Center, please go directly to the Cancer Center and check in at the registration area. ?  ?Wear comfortable clothing and clothing appropriate for easy access to any Portacath or PICC line.  ? ?We strive to give you quality time with your provider. You may need to reschedule your appointment if you arrive late (15 or more minutes).  Arriving late affects you and other patients whose appointments are after yours.  Also, if you miss three or more appointments without notifying the office, you may be dismissed from the clinic at the provider?s discretion.    ?  ?For prescription refill requests, have your pharmacy contact our office and allow 72 hours for refills to be completed.   ? ?Today you received the following chemotherapy and/or immunotherapy agents : Doxil    ?  ?To help prevent nausea and vomiting after your treatment, we encourage you to take your nausea medication as directed. ? ?BELOW ARE SYMPTOMS THAT SHOULD BE REPORTED IMMEDIATELY: ?*FEVER GREATER THAN 100.4 F (38 ?C) OR HIGHER ?*CHILLS OR SWEATING ?*NAUSEA AND VOMITING THAT IS NOT CONTROLLED WITH YOUR NAUSEA MEDICATION ?*UNUSUAL SHORTNESS OF BREATH ?*UNUSUAL BRUISING OR BLEEDING ?*URINARY PROBLEMS (pain or burning when urinating, or frequent urination) ?*BOWEL PROBLEMS (unusual diarrhea, constipation, pain near the anus) ?TENDERNESS IN MOUTH AND THROAT WITH OR WITHOUT PRESENCE OF ULCERS (sore throat, sores in mouth, or a toothache) ?UNUSUAL RASH, SWELLING OR PAIN  ?UNUSUAL VAGINAL DISCHARGE OR ITCHING  ? ?Items with * indicate a potential emergency and should be followed up as soon as possible or go to the Emergency Department if any problems should occur. ? ?Please show the CHEMOTHERAPY ALERT CARD or IMMUNOTHERAPY ALERT CARD at check-in to the  Emergency Department and triage nurse. ? ?Should you have questions after your visit or need to cancel or reschedule your appointment, please contact [facility]  Dept: [phone]  and follow the prompts.  Office hours are 8:00 a.m. to 4:30 p.m. Monday - Friday. Please note that voicemails left after 4:00 p.m. may not be returned until the following business day.  We are closed weekends and major holidays. You have access to a nurse at all times for urgent questions. Please call the main number to the clinic Dept: [phone] and follow the prompts. ? ? ?For any non-urgent questions, you may also contact your provider using MyChart. We now offer e-Visits for anyone 18 and older to request care online for non-urgent symptoms. For details visit [email or web address]. ?  ?Also download the MyChart app! Go to the app store, search "MyChart", open the app, select [facility], and log in with your MyChart username and password. ? ?Due to Covid, a mask is required upon entering the hospital/clinic. If you do not have a mask, one will be given to you upon arrival. For doctor visits, patients may have 1 support person aged 18 or older with them. For treatment visits, patients cannot have anyone with them due to current Covid guidelines and our immunocompromised population.  ? ?

## 2021-09-10 NOTE — Assessment & Plan Note (Signed)
She has chronic kidney disease, multifactorial, related to aging, hypertension and history of hydronephrosis ?Observe closely ?

## 2021-09-10 NOTE — Assessment & Plan Note (Signed)
So far, she tolerated treatment well without major side effects ?Her blood pressure is now starting to trend down due to discontinuation of Lenvima ?We will monitor closely ?Plan to repeat imaging study next month for further follow-up and objective assessment of response to treatment ?

## 2021-09-10 NOTE — Assessment & Plan Note (Signed)
Her blood pressure continues to trend down ?We have discontinue amlodipine recently ?I will continue to taper her off Cardura and atenolol ?

## 2021-09-11 LAB — T4: T4, Total: 7.2 ug/dL (ref 4.5–12.0)

## 2021-09-18 ENCOUNTER — Telehealth: Payer: Self-pay

## 2021-09-18 ENCOUNTER — Encounter: Payer: Self-pay | Admitting: Urology

## 2021-09-18 NOTE — Progress Notes (Signed)
Telephone appointment. I verified patient identity and began nursing interview. Patient states "She's doing well." No issues reported at this time. ? ?Meaningful use complete. ? ?Reminded patient of their 9:30am-09/19/21 telephone appointment w/ Ashlyn Bruning PA-C. I left my extension [phone] in case patient needs anything. Patient verbalized understanding of the conversation. ? ?Patient contact [phone] ?

## 2021-09-18 NOTE — Telephone Encounter (Signed)
Telephone appointment reminder. I left a voicemail reminding patient of their 9:30am-09/19/21 telephone appointment w/ Ashlyn Bruning PA-C. I left my extension [phone] and requested that patient return my call, so that I may complete the nursing portion of this appointment. ?

## 2021-09-19 ENCOUNTER — Ambulatory Visit
Admission: RE | Admit: 2021-09-19 | Discharge: 2021-09-19 | Disposition: A | Discharge status: Home / Self Care | Payer: Medicare Other | Source: Ambulatory Visit | Attending: Urology | Admitting: Urology

## 2021-09-19 ENCOUNTER — Other Ambulatory Visit: Payer: Self-pay | Admitting: Radiation Therapy

## 2021-09-19 DIAGNOSIS — C7931 Secondary malignant neoplasm of brain: Secondary | ICD-10-CM

## 2021-09-19 NOTE — Progress Notes (Signed)
?  Radiation Oncology         [phone] ?________________________________ ? ?Name: Teresa Oliver MRN: [id number]  ?Date: 08/13/2021  DOB: March 03, 1946 ? ?End of Treatment Note ? ?Diagnosis:   76 y.o. female with numerous brain metastases secondary to metastatic uterine cancer  ?     ?Indication for treatment:  Palliation      ? ?Radiation treatment dates:   07/31/21 - 08/13/21 ? ?Site/dose:   The whole brain was treated to 30 Gy in 10 fractions of 3 Gy ? ?Beams/energy:   Right and Left radiation fields were treated using 6 MV X-rays with custom MLC collimation to shield the eyes and face.  The patient was immobilized with a thermoplastic mask and isocenter was verified with weekly port films. ? ?Narrative: The patient tolerated radiation treatment relatively well without any ill side effects. She did report modest fatigue and mild nausea. ? ?Plan: The patient has completed radiation treatment. The patient will return to radiation oncology clinic for routine followup in one month. I advised them to call or return sooner if they have any questions or concerns related to their recovery or treatment. ?________________________________ ? ?Sheral Apley Tammi Klippel, M.D.  ?

## 2021-09-19 NOTE — Progress Notes (Signed)
?Radiation Oncology         [phone] ?________________________________ ? ?Name: Teresa Oliver MRN: [id number]  ?Date: 09/19/2021  DOB: [birth year] ? ?Post Treatment Note ? ?CC: [facility], [employer], [place] * ? ?Diagnosis:   76 y.o. 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 ?Interval Since Last Radiation:  5 weeks  ?07/31/21 - 08/13/21:   The whole brain was treated to 30 Gy in 10 fractions of 3 Gy ? ?05/31/21: SRS brain// PTV1 (pre-op) treated to 15 Gy in a single fraction; PTV2-5 treated to 20 Gy in a single fraction: ?  ? ?Narrative:  I spoke with the patient to conduct her routine scheduled 1 month follow up visit via telephone to spare the patient unnecessary potential exposure in the healthcare setting during the current COVID-19 pandemic.  The patient was notified in advance and gave permission to proceed with this visit format. ? ?She tolerated radiation treatment relatively well with only modest fatigue, mild nausea and some scalp Irritation.                             ? ?On review of systems, the patient states that she is doing very well in general.  She reports that the scalp irritation continues to gradually improve and she denies any headaches, changes in her visual or auditory acuity, dizziness/imbalance, tremor or seizure activity.  She continues with modest fatigue but overall, is pleased with her progress to date.  She continues to tolerate the new systemic therapy with doxorubicin fairly well and has now completed 3 cycles as of 09/10/2021. ? ?ALLERGIES:  has No Known Allergies. ? ?Meds: ?Current Outpatient Medications  ?Medication Sig Dispense Refill  ? magic mouthwash w/lidocaine SOLN Take 5 mLs by mouth 4 (four) times daily. 5 ml QID, swish and spit 240 mL 0  ? atenolol (TENORMIN) 50 MG tablet Take 0.5 tablets (25 mg total) by mouth daily. 90 tablet 0  ? cholecalciferol (VITAMIN D3) 25 MCG (1000 UNIT)  tablet Take 1,000 Units by mouth daily.    ? doxazosin (CARDURA) 4 MG tablet TAKE 1 TABLET(4 MG) BY MOUTH DAILY (Patient not taking: Reported on 09/18/2021) 30 tablet 3  ? lidocaine-prilocaine (EMLA) cream Apply 1 application topically daily as needed. 30 g 3  ? LORazepam (ATIVAN) 0.5 MG tablet TAKE 1 TABLET(0.5 MG) BY MOUTH TWICE DAILY AS NEEDED FOR ANXIETY (Patient taking differently: Take 0.5 mg by mouth 2 (two) times daily as needed for anxiety.) 60 tablet 0  ? magic mouthwash (nystatin, lidocaine, diphenhydrAMINE, alum & mag hydroxide) suspension Swish 5 mL in the mouth and spit out  4 times daily . 240 mL 0  ? ondansetron (ZOFRAN) 8 MG tablet Take 1 tablet (8 mg total) by mouth every 8 (eight) hours as needed for nausea. 30 tablet 3  ? prochlorperazine (COMPAZINE) 10 MG tablet Take 1 tablet (10 mg total) by mouth every 6 (six) hours as needed for nausea or vomiting. 30 tablet 1  ? ?No current facility-administered medications for this encounter.  ? ? ?Physical Findings: ? vitals were not taken for this visit.  ?Pain Assessment ?Pain Score: 0-No pain/10 ?Unable to assess due to telephone follow-up visit format. ? ?Lab Findings: ?Lab Results  ?Component Value Date  ? WBC 3.4 (L) 09/10/2021  ? HGB 8.4 (L) 09/10/2021  ? HCT 26.5 (L) 09/10/2021  ? MCV 72.6 (L) 09/10/2021  ?  PLT 278 09/10/2021  ? ? ? ?Radiographic Findings: ?No results found. ? ?Impression/Plan: ?63. 76 y.o. 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 ?She appears to have recovered well from the effects of her recent whole brain radiation and is currently without complaints.  She continues to tolerate the new systemic therapy with doxorubicin well and the current plan is to continue with her current regimen and repeat systemic imaging on 09/25/2021, prior to her next follow-up visit with Dr. Alvy Bimler on 09/27/2021.  We will plan to repeat an MRI brain scan in June 2023 to assess her treatment  response and pending this scan is stable, we will then proceed with serial MRI brain scans every 3 months to continue to monitor for any evidence of disease progression or recurrence.  She appears to have a good understanding of these recommendations and is comfortable and in agreement with the stated plan.  She knows that she is welcome to call at anytime in interim with any questions or concerns related to her previous radiation. ? ? ?Nicholos Johns, PA-C  ?

## 2021-09-25 ENCOUNTER — Encounter (HOSPITAL_COMMUNITY): Payer: Self-pay

## 2021-09-25 ENCOUNTER — Ambulatory Visit (HOSPITAL_COMMUNITY)
Admission: RE | Admit: 2021-09-25 | Discharge: 2021-09-25 | Disposition: A | Discharge status: Home / Self Care | Payer: Medicare Other | Source: Ambulatory Visit | Attending: Hematology and Oncology | Admitting: Hematology and Oncology

## 2021-09-25 DIAGNOSIS — C549 Malignant neoplasm of corpus uteri, unspecified: Secondary | ICD-10-CM | POA: Insufficient documentation

## 2021-09-25 IMAGING — CT CT CHEST-ABD-PELV W/ CM
2 of 5 series · 12 of 36 positions shown, 14 images · IV contrast (OMNIPAQUE)
Comparison: [DATE]

CLINICAL DATA: Uterine/cervical cancer restaging, metastatic to
brain, ongoing chemotherapy * Tracking Code: BO *

EXAM:
CT CHEST, ABDOMEN, AND PELVIS WITH CONTRAST
TECHNIQUE: Multidetector CT imaging of the chest, abdomen and pelvis was
performed following the standard protocol during bolus
administration of intravenous contrast.

[Series 2: cap with · axial · 0.75mm/px · z∈[-537,-92]mm · 9 of 113 slices shown, 11 images]
[im 12/113  mediastinal]
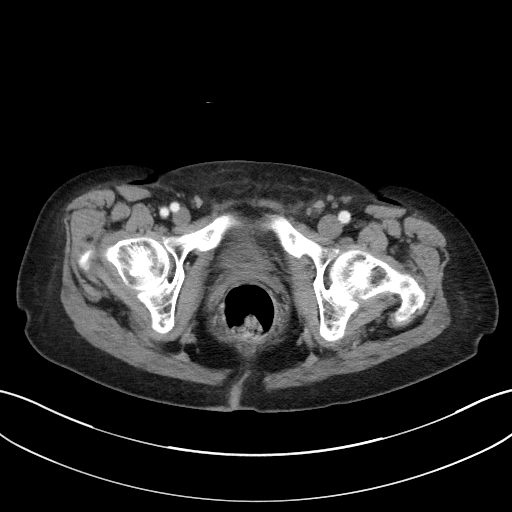
[im 12/113  bone]
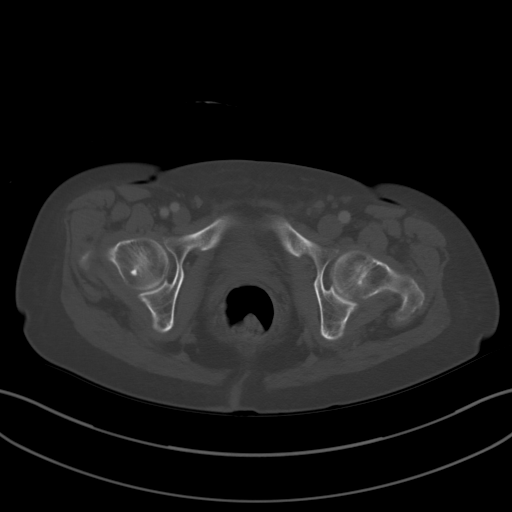
[im 23/113  mediastinal]
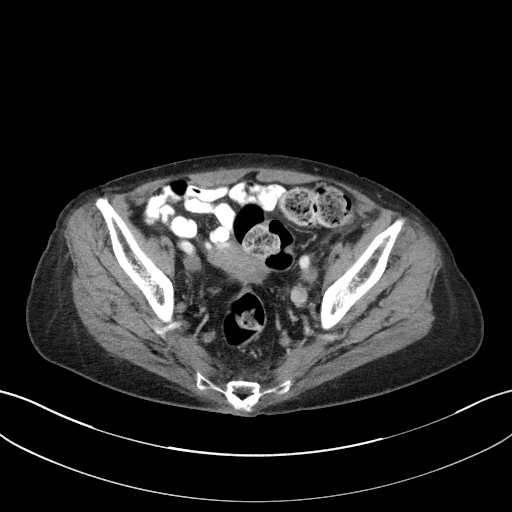
[im 34/113  mediastinal]
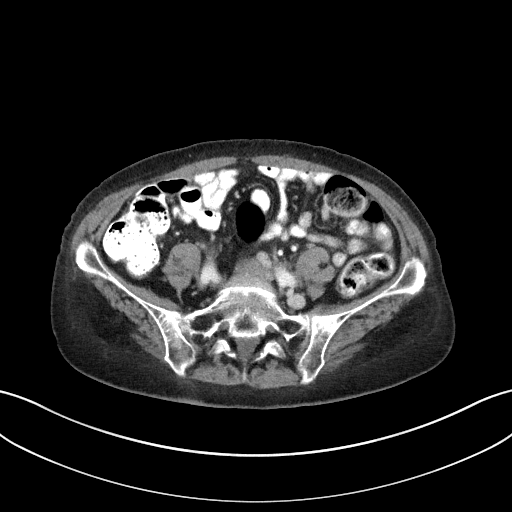
[im 45/113  mediastinal]
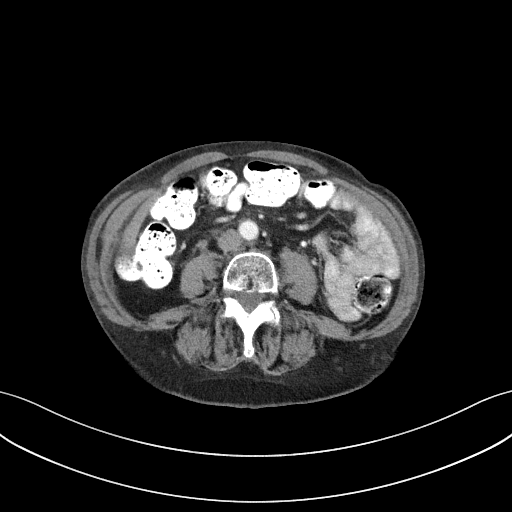
[im 57/113  mediastinal]
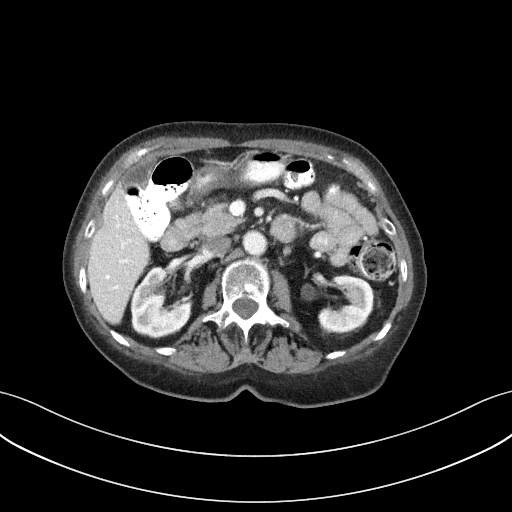
[im 68/113  mediastinal]
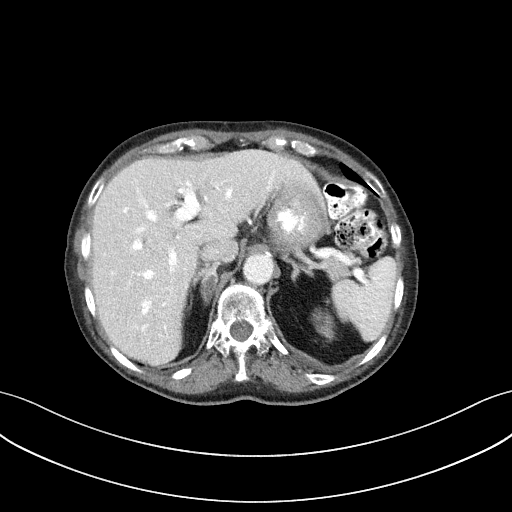
[im 79/113  mediastinal]
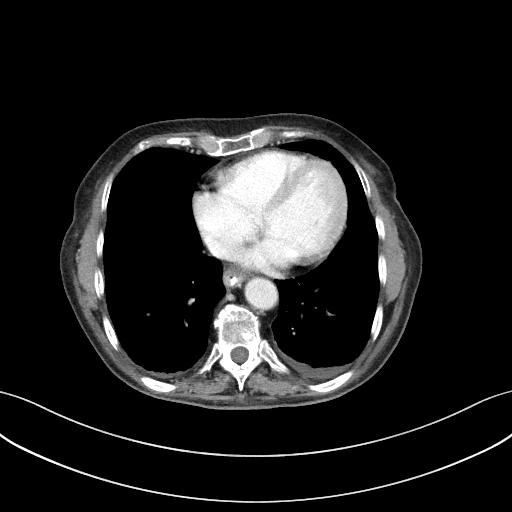
[im 90/113  mediastinal]
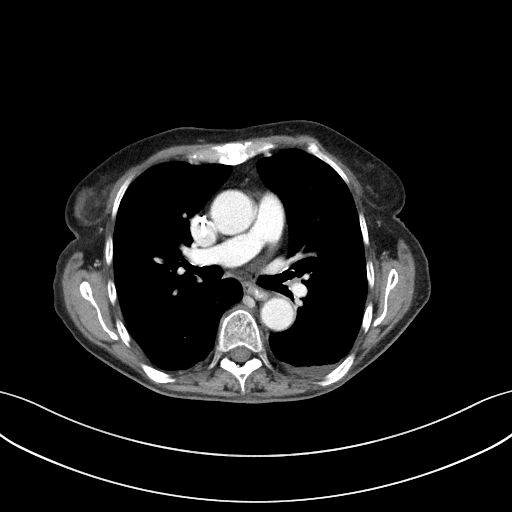
[im 101/113  mediastinal]
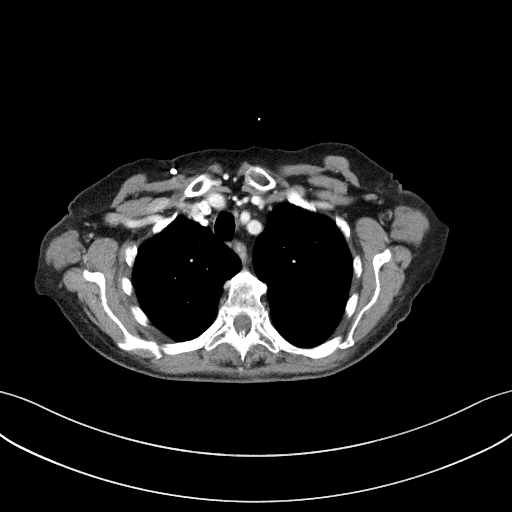
[im 101/113  bone]
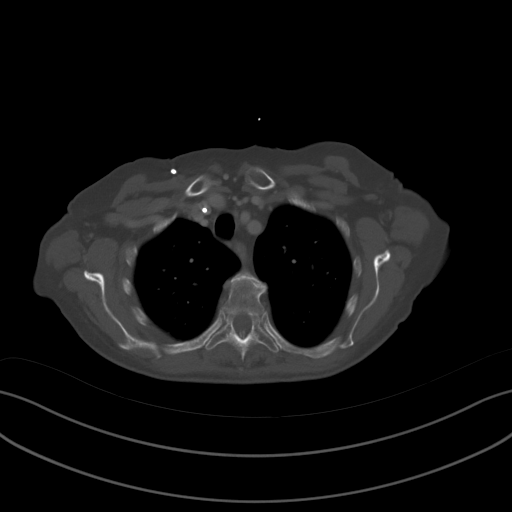

[Series 5: coronals · coronal · 0.63mm/px · 3 of 131 slices shown]
[im 27/131  mediastinal]
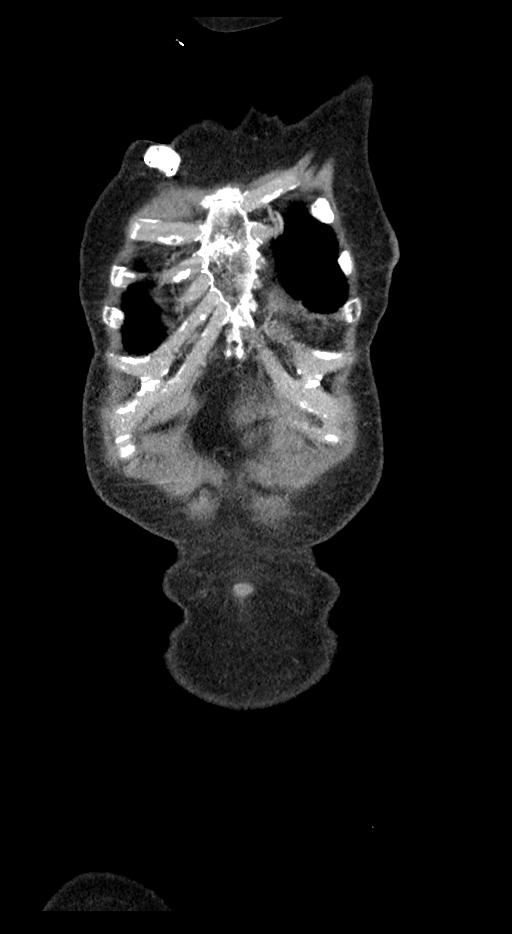
[im 53/131  mediastinal]
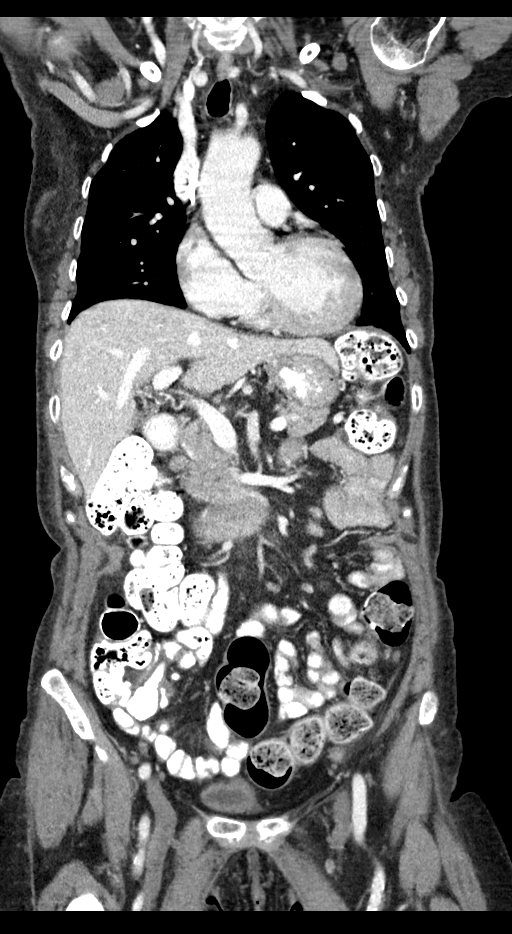
[im 79/131  mediastinal]
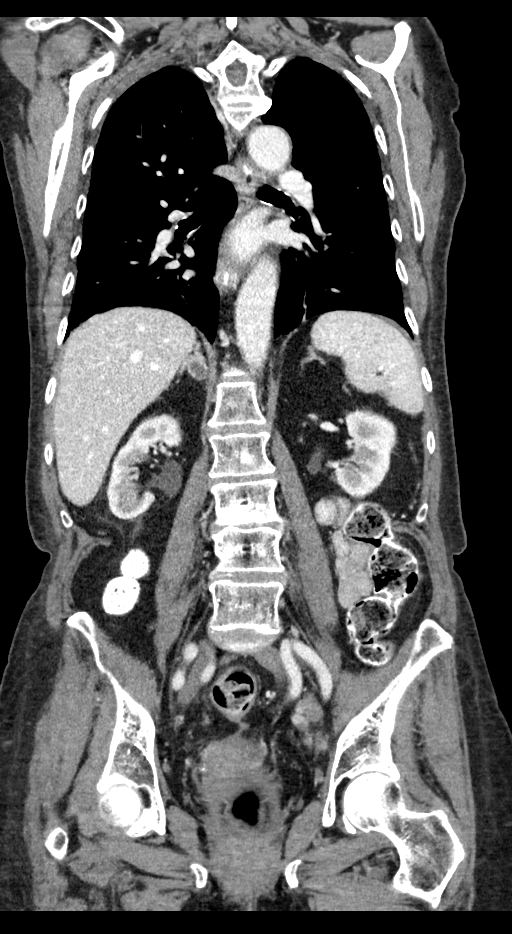

[12 of 36 positions shown; findings below may reference images not displayed]

RADIATION DOSE REDUCTION: This exam was performed according to the
departmental dose-optimization program which includes automated
exposure control, adjustment of the mA and/or kV according to
patient size and/or use of iterative reconstruction technique.

CONTRAST:  75mL OMNIPAQUE IOHEXOL 300 MG/ML SOLN, additional oral
enteric contrast
FINDINGS: CT CHEST FINDINGS

Cardiovascular: Right chest port catheter. Scattered aortic
atherosclerosis. Normal heart size. No pericardial effusion.

Mediastinum/Nodes: No enlarged mediastinal, hilar, or axillary lymph
nodes. Small hiatal hernia. Thyroid gland, trachea, and esophagus
demonstrate no significant findings.

Lungs/Pleura: Lungs are clear. Trace bilateral pleural effusions,
unchanged.

Musculoskeletal: No chest wall mass.

CT ABDOMEN PELVIS FINDINGS

Hepatobiliary: No solid liver abnormality is seen. No gallstones,
gallbladder wall thickening, or biliary dilatation.

Pancreas: Unremarkable. No pancreatic ductal dilatation or
surrounding inflammatory changes.

Spleen: Normal in size without significant abnormality.

Adrenals/Urinary Tract: Slight interval decrease in size of a nodule
of the body and medial limb of the right adrenal gland, measuring
1.6 x 0.9 cm, previously 2.2 x 1.4 cm when measured similarly
(series 2, image 45). Kidneys are normal, without renal calculi,
solid lesion, or hydronephrosis. Bladder is unremarkable.

Stomach/Bowel: Stomach is within normal limits. Appendix appears
normal. No evidence of bowel wall thickening, distention, or
inflammatory changes.

Vascular/Lymphatic: No significant vascular findings are present.
Interval decrease in size in bilateral iliac and pelvic sidewall
lymph nodes, index right iliac node measuring 1.3 x 0.6 cm,
previously 1.8 x 1.5 cm (series 2, image 90), index left iliac node
measuring 1.5 x 1.4 cm, previously 2.6 x 2.2 cm (series 2, image
93).

Reproductive: Tethered appearing soft tissue mass associated with
the posterior uterine fundus is slightly diminished in size,
measuring 2.1 x 2.1 cm, previously 2.6 x 2.1 cm (series 2, image
91).

Other: No abdominal wall hernia or abnormality. No ascites.

Musculoskeletal: Multiple unchanged small sclerotic lesions,
including of the sternal body, inferior endplate of T1, and L2
vertebral body unchanged, subtle superior endplate deformities of T5
and T6 as well as a sclerosis of the L3 vertebral body with superior
and inferior endplate deformities (series 6, image 75).
IMPRESSION: 1. Interval decrease in size in bilateral iliac and pelvic sidewall
lymph nodes.
2. Tethered appearing soft tissue mass associated with the posterior
uterine fundus is slightly diminished in size.
3. Interval decrease in size of a nodule of the body and medial limb
of the right adrenal gland.
4. Constellation of findings is consistent with treatment response.
5. Multiple unchanged small sclerotic osseous lesions.
6. Trace bilateral pleural effusions, unchanged.

Aortic Atherosclerosis ([IG]-[IG]).

## 2021-09-25 MED ORDER — HEPARIN SOD (PORK) LOCK FLUSH 100 UNIT/ML IV SOLN
INTRAVENOUS | Status: AC
Start: 2021-09-25 — End: 2021-09-25
  Filled 2021-09-25: qty 5

## 2021-09-25 MED ORDER — SODIUM CHLORIDE (PF) 0.9 % IJ SOLN
INTRAMUSCULAR | Status: AC
Start: 2021-09-25 — End: 2021-09-25
  Filled 2021-09-25: qty 50

## 2021-09-25 MED ORDER — HEPARIN SOD (PORK) LOCK FLUSH 100 UNIT/ML IV SOLN
500.0000 [IU] | Freq: Once | INTRAVENOUS | Status: AC
  Administered 2021-09-25: 500 [IU] via INTRAVENOUS

## 2021-09-25 MED ORDER — IOHEXOL 300 MG/ML  SOLN
75.0000 mL | Freq: Once | INTRAMUSCULAR | Status: AC | PRN
Start: 2021-09-25 — End: 2021-09-25
  Administered 2021-09-25: 75 mL via INTRAVENOUS

## 2021-09-27 ENCOUNTER — Inpatient Hospital Stay: Payer: Medicare Other | Attending: Hematology and Oncology | Admitting: Hematology and Oncology

## 2021-09-27 ENCOUNTER — Other Ambulatory Visit: Payer: Self-pay

## 2021-09-27 ENCOUNTER — Telehealth: Payer: Self-pay

## 2021-09-27 ENCOUNTER — Encounter: Payer: Self-pay | Admitting: Hematology and Oncology

## 2021-09-27 DIAGNOSIS — I081 Rheumatic disorders of both mitral and tricuspid valves: Secondary | ICD-10-CM | POA: Diagnosis not present

## 2021-09-27 DIAGNOSIS — T451X5A Adverse effect of antineoplastic and immunosuppressive drugs, initial encounter: Secondary | ICD-10-CM | POA: Insufficient documentation

## 2021-09-27 DIAGNOSIS — J90 Pleural effusion, not elsewhere classified: Secondary | ICD-10-CM | POA: Diagnosis not present

## 2021-09-27 DIAGNOSIS — K449 Diaphragmatic hernia without obstruction or gangrene: Secondary | ICD-10-CM | POA: Diagnosis not present

## 2021-09-27 DIAGNOSIS — I700 Atherosclerosis of aorta: Secondary | ICD-10-CM | POA: Insufficient documentation

## 2021-09-27 DIAGNOSIS — C549 Malignant neoplasm of corpus uteri, unspecified: Secondary | ICD-10-CM | POA: Diagnosis not present

## 2021-09-27 DIAGNOSIS — I10 Essential (primary) hypertension: Secondary | ICD-10-CM | POA: Diagnosis not present

## 2021-09-27 DIAGNOSIS — C55 Malignant neoplasm of uterus, part unspecified: Secondary | ICD-10-CM | POA: Diagnosis present

## 2021-09-27 DIAGNOSIS — N183 Chronic kidney disease, stage 3 unspecified: Secondary | ICD-10-CM | POA: Insufficient documentation

## 2021-09-27 DIAGNOSIS — I129 Hypertensive chronic kidney disease with stage 1 through stage 4 chronic kidney disease, or unspecified chronic kidney disease: Secondary | ICD-10-CM | POA: Insufficient documentation

## 2021-09-27 DIAGNOSIS — R42 Dizziness and giddiness: Secondary | ICD-10-CM | POA: Diagnosis not present

## 2021-09-27 DIAGNOSIS — Z5111 Encounter for antineoplastic chemotherapy: Secondary | ICD-10-CM | POA: Diagnosis present

## 2021-09-27 DIAGNOSIS — K1231 Oral mucositis (ulcerative) due to antineoplastic therapy: Secondary | ICD-10-CM | POA: Diagnosis not present

## 2021-09-27 DIAGNOSIS — Z79899 Other long term (current) drug therapy: Secondary | ICD-10-CM | POA: Insufficient documentation

## 2021-09-27 DIAGNOSIS — C7931 Secondary malignant neoplasm of brain: Secondary | ICD-10-CM | POA: Insufficient documentation

## 2021-09-27 NOTE — Assessment & Plan Note (Signed)
She will continue surveillance imaging study as arranged by radiation oncologist ?

## 2021-09-27 NOTE — Assessment & Plan Note (Signed)
She has mild intermittent mucositis but not severe ?Observe only for now ?I recommend the patient to use salt water gargle ?She does not need refill on Magic mouthwash ?

## 2021-09-27 NOTE — Progress Notes (Signed)
[place] ?OFFICE PROGRESS NOTE ? ?Patient Care Team: ?[facility] as PCP - General (Rheumatology) ?[name], RN as Oncology Nurse Navigator (Oncology) ? ?ASSESSMENT & PLAN:  ?Uterine cancer ([place]) ?I have reviewed multiple CT imaging with the patient ?She has excellent response to therapy ?She had very mild mucositis with recent treatment that resolved spontaneously ?I plan to continue systemic treatment for few more months with plan to repeat imaging study again in August ?I will order echocardiogram to follow ? ?Essential hypertension ?She has significant fluctuation of her documented blood pressure from home ?For now, she will continue 25 mg of atenolol and 4 mg of Cardura ?She is advised to continue blood pressure monitoring at home ?I am hopeful we can get her off some of these medications ? ?Malignant neoplasm metastatic to brain [facility]) ?She will continue surveillance imaging study as arranged by radiation oncologist ? ?Mucositis due to chemotherapy ?She has mild intermittent mucositis but not severe ?Observe only for now ?I recommend the patient to use salt water gargle ?She does not need refill on Magic mouthwash ? ?Orders Placed This Encounter  ?Procedures  ? ECHOCARDIOGRAM COMPLETE  ?  Standing Status:   Future  ?  Standing Expiration Date:   09/28/2022  ?  Order Specific Question:   Where should this test be performed  ?  Answer:   [facility]  ?  Order Specific Question:   Perflutren DEFINITY (image enhancing agent) should be administered unless hypersensitivity or allergy exist  ?  Answer:   Administer Perflutren  ?  Order Specific Question:   Reason for exam-Echo  ?  Answer:   Chemo  Z09  ? ? ?All questions were answered. The patient knows to call the clinic with any problems, questions or concerns. ?The total time spent in the appointment was 30 minutes encounter with patients including review of chart and various tests results, discussions about plan of  care and coordination of care plan ?  ?[name], MD ?09/27/2021 9:48 AM ? ?INTERVAL HISTORY: ?Please see below for problem oriented charting. ?she returns for treatment follow-up to review imaging study results ?She is doing well since last time I saw her ?Has occasional mild mucositis ?Her documented blood pressure from home range between 102 to as high as 150 ?She has occasional dizziness but no falls ? ?REVIEW OF SYSTEMS:   ?Constitutional: Denies fevers, chills or abnormal weight loss ?Eyes: Denies blurriness of vision ?Ears, nose, mouth, throat, and face: Denies mucositis or sore throat ?Respiratory: Denies cough, dyspnea or wheezes ?Cardiovascular: Denies palpitation, chest discomfort or lower extremity swelling ?Gastrointestinal:  Denies nausea, heartburn or change in bowel habits ?Skin: Denies abnormal skin rashes ?Lymphatics: Denies new lymphadenopathy or easy bruising ?Neurological:Denies numbness, tingling or new weaknesses ?Behavioral/Psych: Mood is stable, no new changes  ?All other systems were reviewed with the patient and are negative. ? ?I have reviewed the past medical history, past surgical history, social history and family history with the patient and they are unchanged from previous note. ? ?ALLERGIES:  has No Known Allergies. ? ?MEDICATIONS:  ?Current Outpatient Medications  ?Medication Sig Dispense Refill  ? magic mouthwash w/lidocaine SOLN Take 5 mLs by mouth 4 (four) times daily. 5 ml QID, swish and spit 240 mL 0  ? atenolol (TENORMIN) 50 MG tablet Take 0.5 tablets (25 mg total) by mouth daily. 90 tablet 0  ? cholecalciferol (VITAMIN D3) 25 MCG (1000 UNIT) tablet Take 1,000 Units by mouth daily.    ?  doxazosin (CARDURA) 4 MG tablet TAKE 1 TABLET(4 MG) BY MOUTH DAILY (Patient not taking: Reported on 09/18/2021) 30 tablet 3  ? lidocaine-prilocaine (EMLA) cream Apply 1 application topically daily as needed. 30 g 3  ? LORazepam (ATIVAN) 0.5 MG tablet TAKE 1 TABLET(0.5 MG) BY MOUTH TWICE DAILY AS  NEEDED FOR ANXIETY (Patient taking differently: Take 0.5 mg by mouth 2 (two) times daily as needed for anxiety.) 60 tablet 0  ? magic mouthwash (nystatin, lidocaine, diphenhydrAMINE, alum & mag hydroxide) suspension Swish 5 mL in the mouth and spit out  4 times daily . 240 mL 0  ? ondansetron (ZOFRAN) 8 MG tablet Take 1 tablet (8 mg total) by mouth every 8 (eight) hours as needed for nausea. 30 tablet 3  ? prochlorperazine (COMPAZINE) 10 MG tablet Take 1 tablet (10 mg total) by mouth every 6 (six) hours as needed for nausea or vomiting. 30 tablet 1  ? ?No current facility-administered medications for this visit.  ? ? ?SUMMARY OF ONCOLOGIC HISTORY: ?Oncology History Overview Note  ?High grade serous ?MMR normal ?Her2 negative ?Progressed on carboplatin, taxol, pembrolizumab and lenvima ?  ?Uterine cancer ([place])  ?08/28/2020 Imaging  ? 1. Pelvic mass measuring 7.5 x 4.8 cm, contiguous with the sigmoid colon and uterus with loss of fat planes. Site of origin is unclear, may be ovarian, uterine, or colonic. Lack of contrast limits detailed assessment. ?2. Above pelvic mass causes partial obstruction of the left ureter with mild left hydroureteronephrosis. ?3. Multifocal adenopathy in the abdomen and pelvis, suspicious for metastatic disease, primarily in the mesentery and lower peritoneum and pelvis. Please note that detailed assessment of adenopathy is limited on this noncontrast exam. Recommend oncology referral.  ?4. Wall thickening of the sigmoid colon distal to the left pelvic mass with pericolonic edema, suspicious for colitis. There is a moderate to large amount of stool in the more proximal colon, suggesting pelvic mass may be causing delayed colonic transit. ?5. Small to moderate hiatal hernia. ?  ?08/28/2020 - 09/04/2020 Hospital Admission  ? The patient has been complaining of reduced urine output, loss of appetite and changes in bowel habits ?She had abnormal imaging study leading to hospitalization,  colonoscopy, biopsy and subsequently chemotherapy ?  ?08/30/2020 Procedure  ? CT-guided biopsy of enlarged left external iliac chain lymph node as above ?  ?08/30/2020 Pathology Results  ? SURGICAL PATHOLOGY  ?CASE: [id number]  ?PATIENT: Teresa Oliver  ?Surgical Pathology Report  ?Clinical History: Pelvic mass with metastatic lymph adenopathy ([facility])  ?FINAL MICROSCOPIC DIAGNOSIS:  ? ?A. LYMPH NODE, LEFT EXTERNAL ILIAC CHAIN, BIOPSY:  ?- Poorly differentiated carcinoma with necrosis.  ?- See comment.  ? ?COMMENT:  ?The carcinoma is characterized by diffuse sheets of malignant epithelioid cells with prominent nucleoli and frequent mitotic figures. Immunohistochemistry is positive with cytokeratin 7 and PAX 8 with weak  ?positivity with estrogen receptor and WT1.  P53 shows diffuse strong nuclear positivity.  The carcinoma is negative with cytokeratin 20, CDX2, GATA3, GCDFP, TTF-1, HepPar 1, arginase 1 and CD10.  The  ?morphology and immunophenotype are most consistent with a gynecologic primary including high-grade serous carcinoma.  ?  ?08/31/2020 Procedure  ? Colonoscopy ?- Preparation of the colon was fair. ?- Likely malignant partially obstructing tumor in the sigmoid colon (appears extrinsic process). Biopsied. Tattooed. ?- Internal hemorrhoids. ?- Stool in the entire examined colon ?  ?09/01/2020 Initial Diagnosis  ? Uterine cancer ([place]) ? ?  ?09/01/2020 Procedure  ? Successful placement of a power injectable Port-A-Cath  via the right internal jugular vein. The catheter is ready for immediate use. ?  ?09/04/2020 - 12/29/2020 Chemotherapy  ?  Patient is on Treatment Plan: UTERINE CARBOPLATIN AUC 6 / PACLITAXEL Q21D ? ?  ? ?  ?11/15/2020 Imaging  ? 1. Marked interval response to therapy with decrease in size of the mass extending from the uterus to the colon that was seen on the previous examination. Also with decreased size and or resolution of pelvic, mesenteric and retroperitoneal adenopathy seen on previous  imaging. ?2. Tethering of the colon with area that raise the question of developing colovaginal fistula versus internal enhancement within the mass that is now decreased in size. Attention on follow-up is suggested. Co

## 2021-09-27 NOTE — Assessment & Plan Note (Signed)
She has significant fluctuation of her documented blood pressure from home ?For now, she will continue 25 mg of atenolol and 4 mg of Cardura ?She is advised to continue blood pressure monitoring at home ?I am hopeful we can get her off some of these medications ?

## 2021-09-27 NOTE — Telephone Encounter (Signed)
-----   Message from [name], MD sent at 09/27/2021  9:49 AM EDT ----- ?Regarding: ECHO ?Can you call and schedule ECHO to be done on 5/18, preferably at [facility]? ? ?

## 2021-09-27 NOTE — Assessment & Plan Note (Signed)
I have reviewed multiple CT imaging with the patient ?She has excellent response to therapy ?She had very mild mucositis with recent treatment that resolved spontaneously ?I plan to continue systemic treatment for few more months with plan to repeat imaging study again in August ?I will order echocardiogram to follow ?

## 2021-09-27 NOTE — Telephone Encounter (Signed)
Called and given appt at [facility] on 5/18 at 2 pm, arrive at 1:45 pm. She verbalized understanding. ?

## 2021-10-04 ENCOUNTER — Ambulatory Visit (INDEPENDENT_AMBULATORY_CARE_PROVIDER_SITE_OTHER): Payer: Medicare Other

## 2021-10-04 DIAGNOSIS — C549 Malignant neoplasm of corpus uteri, unspecified: Secondary | ICD-10-CM

## 2021-10-04 DIAGNOSIS — Z0189 Encounter for other specified special examinations: Secondary | ICD-10-CM | POA: Diagnosis not present

## 2021-10-04 LAB — ECHOCARDIOGRAM COMPLETE
AR max vel: 2.38 cm2
AV Area VTI: 1.82 cm2
AV Area mean vel: 1.87 cm2
AV Mean grad: 3 mmHg
AV Peak grad: 6.3 mmHg
Ao pk vel: 1.25 m/s
Area-P 1/2: 1.62 cm2
Calc EF: 56 %
S' Lateral: 2.71 cm
Single Plane A2C EF: 47 %
Single Plane A4C EF: 61.2 %

## 2021-10-05 MED FILL — Dexamethasone Sodium Phosphate Inj 100 MG/10ML: INTRAMUSCULAR | Qty: 1 | Status: AC

## 2021-10-08 ENCOUNTER — Inpatient Hospital Stay (HOSPITAL_BASED_OUTPATIENT_CLINIC_OR_DEPARTMENT_OTHER): Payer: Medicare Other | Admitting: Hematology and Oncology

## 2021-10-08 ENCOUNTER — Encounter: Payer: Self-pay | Admitting: Hematology and Oncology

## 2021-10-08 ENCOUNTER — Inpatient Hospital Stay: Payer: Medicare Other

## 2021-10-08 ENCOUNTER — Other Ambulatory Visit: Payer: Self-pay | Admitting: Hematology and Oncology

## 2021-10-08 ENCOUNTER — Other Ambulatory Visit: Payer: Self-pay

## 2021-10-08 DIAGNOSIS — I10 Essential (primary) hypertension: Secondary | ICD-10-CM

## 2021-10-08 DIAGNOSIS — C549 Malignant neoplasm of corpus uteri, unspecified: Secondary | ICD-10-CM

## 2021-10-08 DIAGNOSIS — Z5111 Encounter for antineoplastic chemotherapy: Secondary | ICD-10-CM | POA: Diagnosis not present

## 2021-10-08 DIAGNOSIS — N183 Chronic kidney disease, stage 3 unspecified: Secondary | ICD-10-CM | POA: Diagnosis not present

## 2021-10-08 LAB — CBC WITH DIFFERENTIAL (CANCER CENTER ONLY)
Abs Immature Granulocytes: 0.05 10*3/uL (ref 0.00–0.07)
Basophils Absolute: 0 10*3/uL (ref 0.0–0.1)
Basophils Relative: 1 %
Eosinophils Absolute: 0.1 10*3/uL (ref 0.0–0.5)
Eosinophils Relative: 1 %
HCT: 27.9 % — ABNORMAL LOW (ref 36.0–46.0)
Hemoglobin: 9 g/dL — ABNORMAL LOW (ref 12.0–15.0)
Immature Granulocytes: 1 %
Lymphocytes Relative: 17 %
Lymphs Abs: 0.7 10*3/uL (ref 0.7–4.0)
MCH: 23.1 pg — ABNORMAL LOW (ref 26.0–34.0)
MCHC: 32.3 g/dL (ref 30.0–36.0)
MCV: 71.7 fL — ABNORMAL LOW (ref 80.0–100.0)
Monocytes Absolute: 0.6 10*3/uL (ref 0.1–1.0)
Monocytes Relative: 15 %
Neutro Abs: 2.6 10*3/uL (ref 1.7–7.7)
Neutrophils Relative %: 65 %
Platelet Count: 269 10*3/uL (ref 150–400)
RBC: 3.89 MIL/uL (ref 3.87–5.11)
RDW: 16.3 % — ABNORMAL HIGH (ref 11.5–15.5)
WBC Count: 4 10*3/uL (ref 4.0–10.5)
nRBC: 0 % (ref 0.0–0.2)

## 2021-10-08 LAB — CMP (CANCER CENTER ONLY)
ALT: 5 U/L (ref 0–44)
AST: 12 U/L — ABNORMAL LOW (ref 15–41)
Albumin: 3.9 g/dL (ref 3.5–5.0)
Alkaline Phosphatase: 43 U/L (ref 38–126)
Anion gap: 6 (ref 5–15)
BUN: 23 mg/dL (ref 8–23)
CO2: 27 mmol/L (ref 22–32)
Calcium: 9.4 mg/dL (ref 8.9–10.3)
Chloride: 104 mmol/L (ref 98–111)
Creatinine: 1.45 mg/dL — ABNORMAL HIGH (ref 0.44–1.00)
GFR, Estimated: 38 mL/min — ABNORMAL LOW (ref >60)
Glucose, Bld: 134 mg/dL — ABNORMAL HIGH (ref 70–99)
Potassium: 3.9 mmol/L (ref 3.5–5.1)
Sodium: 137 mmol/L (ref 135–145)
Total Bilirubin: 0.3 mg/dL (ref 0.3–1.2)
Total Protein: 6.7 g/dL (ref 6.5–8.1)

## 2021-10-08 MED ORDER — SODIUM CHLORIDE 0.9% FLUSH
10.0000 mL | INTRAVENOUS | Status: DC | PRN
  Administered 2021-10-08: 10 mL

## 2021-10-08 MED ORDER — LOSARTAN POTASSIUM 25 MG PO TABS: 25.0000 mg | ORAL_TABLET | Freq: Every day | ORAL | 1 refills | Status: AC

## 2021-10-08 MED ORDER — DEXTROSE 5 % IV SOLN: Freq: Once | INTRAVENOUS | Status: AC

## 2021-10-08 MED ORDER — SODIUM CHLORIDE 0.9% FLUSH
10.0000 mL | Freq: Once | INTRAVENOUS | Status: AC
  Administered 2021-10-08: 10 mL

## 2021-10-08 MED ORDER — SODIUM CHLORIDE 0.9 % IV SOLN
10.0000 mg | Freq: Once | INTRAVENOUS | Status: AC
  Administered 2021-10-08: 10 mg via INTRAVENOUS
  Filled 2021-10-08: qty 10

## 2021-10-08 MED ORDER — DOXORUBICIN HCL LIPOSOMAL CHEMO INJECTION 2 MG/ML
40.5000 mg/m2 | Freq: Once | INTRAVENOUS | Status: AC
  Administered 2021-10-08: 60 mg via INTRAVENOUS
  Filled 2021-10-08: qty 30

## 2021-10-08 MED ORDER — HEPARIN SOD (PORK) LOCK FLUSH 100 UNIT/ML IV SOLN
500.0000 [IU] | Freq: Once | INTRAVENOUS | Status: AC | PRN
  Administered 2021-10-08: 500 [IU]

## 2021-10-08 NOTE — Assessment & Plan Note (Signed)
She has slight intermittent elevated creatinine We discussed importance of adequate hydration 

## 2021-10-08 NOTE — Assessment & Plan Note (Signed)
Her last CT imaging showed excellent response to therapy She had very mild mucositis with recent treatment that resolved spontaneously I plan to continue systemic treatment for few more months with plan to repeat imaging study again in August ECHO shows slight changes in ejection fraction but overall still within normal range I will plan to repeat echo again in August 

## 2021-10-08 NOTE — Progress Notes (Signed)
[facility] OFFICE PROGRESS NOTE  Patient Care Team: [name], MD as PCP - General (Hematology and Oncology) Jacqulyn Liner, RN as Oncology Nurse Navigator (Oncology)  ASSESSMENT & PLAN:  Uterine cancer [facility]) Her last CT imaging showed excellent response to therapy She had very mild mucositis with recent treatment that resolved spontaneously I plan to continue systemic treatment for few more months with plan to repeat imaging study again in August ECHO shows slight changes in ejection fraction but overall still within normal range I will plan to repeat echo again in August  Essential hypertension Her blood pressure is not elevated I recommend addition of losartan  CKD (chronic kidney disease), stage III ([place]) She has slight intermittent elevated creatinine We discussed importance of adequate hydration  No orders of the defined types were placed in this encounter.   All questions were answered. The patient knows to call the clinic with any problems, questions or concerns. The total time spent in the appointment was 25 minutes encounter with patients including review of chart and various tests results, discussions about plan of care and coordination of care plan   [name], MD 10/08/2021 1:18 PM  INTERVAL HISTORY: Please see below for problem oriented charting. she returns for treatment follow-up on single agent Doxil She admits she might not be drinking enough liquid She brought with her documented blood pressure from home Her blood pressure ranged from 120 to as high as 163 Majority of her blood pressure in the afternoon/evening is much higher   REVIEW OF SYSTEMS:   Constitutional: Denies fevers, chills or abnormal weight loss Eyes: Denies blurriness of vision Ears, nose, mouth, throat, and face: Denies mucositis or sore throat Respiratory: Denies cough, dyspnea or wheezes Cardiovascular: Denies palpitation, chest discomfort or lower extremity  swelling Gastrointestinal:  Denies nausea, heartburn or change in bowel habits Skin: Denies abnormal skin rashes Lymphatics: Denies new lymphadenopathy or easy bruising Neurological:Denies numbness, tingling or new weaknesses Behavioral/Psych: Mood is stable, no new changes  All other systems were reviewed with the patient and are negative.  I have reviewed the past medical history, past surgical history, social history and family history with the patient and they are unchanged from previous note.  ALLERGIES:  has No Known Allergies.  MEDICATIONS:  Current Outpatient Medications  Medication Sig Dispense Refill   losartan (COZAAR) 25 MG tablet Take 1 tablet (25 mg total) by mouth daily. 30 tablet 1   atenolol (TENORMIN) 50 MG tablet Take 0.5 tablets (25 mg total) by mouth daily. 90 tablet 0   cholecalciferol (VITAMIN D3) 25 MCG (1000 UNIT) tablet Take 1,000 Units by mouth daily.     doxazosin (CARDURA) 4 MG tablet TAKE 1 TABLET(4 MG) BY MOUTH DAILY (Patient not taking: Reported on 09/18/2021) 30 tablet 3   lidocaine-prilocaine (EMLA) cream Apply 1 application topically daily as needed. 30 g 3   LORazepam (ATIVAN) 0.5 MG tablet TAKE 1 TABLET(0.5 MG) BY MOUTH TWICE DAILY AS NEEDED FOR ANXIETY (Patient taking differently: Take 0.5 mg by mouth 2 (two) times daily as needed for anxiety.) 60 tablet 0   ondansetron (ZOFRAN) 8 MG tablet Take 1 tablet (8 mg total) by mouth every 8 (eight) hours as needed for nausea. 30 tablet 3   prochlorperazine (COMPAZINE) 10 MG tablet Take 1 tablet (10 mg total) by mouth every 6 (six) hours as needed for nausea or vomiting. 30 tablet 1   No current facility-administered medications for this visit.   Facility-Administered Medications Ordered  in Other Visits  Medication Dose Route Frequency Provider Last Rate Last Admin   DOXOrubicin HCL LIPOSOMAL (DOXIL) 60 mg in dextrose 5 % 250 mL chemo infusion  40.5 mg/m2 (Treatment Plan Recorded) Intravenous Once [name],  MD        SUMMARY OF ONCOLOGIC HISTORY: Oncology History Overview Note  High grade serous MMR normal Her2 negative Progressed on carboplatin, taxol, pembrolizumab and lenvima   Uterine cancer ([place])  08/28/2020 Imaging   1. Pelvic mass measuring 7.5 x 4.8 cm, contiguous with the sigmoid colon and uterus with loss of fat planes. Site of origin is unclear, may be ovarian, uterine, or colonic. Lack of contrast limits detailed assessment. 2. Above pelvic mass causes partial obstruction of the left ureter with mild left hydroureteronephrosis. 3. Multifocal adenopathy in the abdomen and pelvis, suspicious for metastatic disease, primarily in the mesentery and lower peritoneum and pelvis. Please note that detailed assessment of adenopathy is limited on this noncontrast exam. Recommend oncology referral.  4. Wall thickening of the sigmoid colon distal to the left pelvic mass with pericolonic edema, suspicious for colitis. There is a moderate to large amount of stool in the more proximal colon, suggesting pelvic mass may be causing delayed colonic transit. 5. Small to moderate hiatal hernia.   08/28/2020 - 09/04/2020 Hospital Admission   The patient has been complaining of reduced urine output, loss of appetite and changes in bowel habits She had abnormal imaging study leading to hospitalization, colonoscopy, biopsy and subsequently chemotherapy   08/30/2020 Procedure   CT-guided biopsy of enlarged left external iliac chain lymph node as above   08/30/2020 Pathology Results   SURGICAL PATHOLOGY  CASE: [id number]  PATIENT: Teresa Oliver  Surgical Pathology Report  Clinical History: Pelvic mass with metastatic lymph adenopathy (crm)  FINAL MICROSCOPIC DIAGNOSIS:   A. LYMPH NODE, LEFT EXTERNAL ILIAC CHAIN, BIOPSY:  - Poorly differentiated carcinoma with necrosis.  - See comment.   COMMENT:  The carcinoma is characterized by diffuse sheets of malignant epithelioid cells with prominent nucleoli  and frequent mitotic figures. Immunohistochemistry is positive with cytokeratin 7 and PAX 8 with weak  positivity with estrogen receptor and WT1.  P53 shows diffuse strong nuclear positivity.  The carcinoma is negative with cytokeratin 20, CDX2, GATA3, GCDFP, TTF-1, HepPar 1, arginase 1 and CD10.  The  morphology and immunophenotype are most consistent with a gynecologic primary including high-grade serous carcinoma.    08/31/2020 Procedure   Colonoscopy - Preparation of the colon was fair. - Likely malignant partially obstructing tumor in the sigmoid colon (appears extrinsic process). Biopsied. Tattooed. - Internal hemorrhoids. - Stool in the entire examined colon   09/01/2020 Initial Diagnosis   Uterine cancer ([place])    09/01/2020 Procedure   Successful placement of a power injectable Port-A-Cath via the right internal jugular vein. The catheter is ready for immediate use.   09/04/2020 - 12/29/2020 Chemotherapy    Patient is on Treatment Plan: UTERINE CARBOPLATIN AUC 6 / PACLITAXEL Q21D       11/15/2020 Imaging   1. Marked interval response to therapy with decrease in size of the mass extending from the uterus to the colon that was seen on the previous examination. Also with decreased size and or resolution of pelvic, mesenteric and retroperitoneal adenopathy seen on previous imaging. 2. Tethering of the colon with area that raise the question of developing colovaginal fistula versus internal enhancement within the mass that is now decreased in size. Attention on follow-up is suggested. Correlate with  any current signs of vaginal drainage. 3. LEFT thoracic inlet adenopathy compatible with involvement in the chest. Area not imaged on previous imaging.   02/18/2021 Imaging   1. Interval progression of left supraclavicular,, left thoracic inlet, left subpectoral, retroperitoneal, mesenteric and pelvic sidewall lymphadenopathy. Metastatic deposits in the central pelvis are also progressive. 2.  Interval progression of left subpectoral lymphadenopathy with thrombus now visible in the left subclavian/innominate vein. 3. Aortic Atherosclerosis (ICD10-I70.0).   02/23/2021 - 06/15/2021 Chemotherapy   Patient is on Treatment Plan : UTERINE Lenvatinib + Pembrolizumab q21d      05/23/2021 Imaging   1. Decreased size of pelvic soft tissue lesions. 2. Decreased size of left supraclavicular and left subpectoral lymph nodes. 3. Overall decreased size of abdominal and pelvic lymph nodes; however, a few left-sided pelvic and inguinal lymph nodes have increased in size. 4. New mild compression deformity of the inferior endplate of L3. 5.  Aortic Atherosclerosis (ICD10-I70.0).     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  Operating microscope.,  Microdissection technique. Surgeon: Kristeen Miss First Assistant: Consuella Lose MD    06/01/2021 Pathology Results   SURGICAL PATHOLOGY  CASE: [phone]  PATIENT: Sukhmani Mcnee  Surgical Pathology Report    FINAL MICROSCOPIC DIAGNOSIS:   A. BRAIN TUMOR, RIGHT CEREBELLAR, RESECTION:  Poorly differentiated carcinoma with morphology and immunoprofile consistent with metastatic previously diagnosed high-grade serous carcinoma.  Clinical correlation is required.   COMMENT:   Immunohistochemistry with appropriate controls for single antibody is positive for CK7, PAX8, WT1 and p53 and is negative for GFAP, CK20 and ER.    07/05/2021 Cancer Staging   Staging form: Corpus Uteri - Carcinoma and Carcinosarcoma, AJCC 8th Edition - Clinical stage from 07/05/2021: FIGO Stage IVB (rcT4, cN2a, pM1) - Signed by [name], MD on 07/05/2021 Stage prefix: Recurrence    07/05/2021 Imaging   1. Today's study demonstrates definitive progression of  disease, as evidenced by enlarging mass associated with the posterior aspect of the uterine fundus, which is intimately associated with loops of small bowel and sigmoid colon, worsening pelvic lymphadenopathy, possible adrenal metastasis, and multifocal osseous metastases, as detailed above. 2. Additional incidental findings, as above.       07/11/2021 Imaging   MRI brain 1. Postsurgical changes from right posterior fossa tumor resection with new focus of contrast enhancement at the right cerebellar hemisphere surgical cavity margins, concerning for recurrence. 2. 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 3. A 5 mm new lesion in the right cerebellar hemisphere. 4. Decrease in size of the previously described left frontal and parietal lesions with resolution of punctate contrast enhancement in the left cerebellar hemisphere.   07/12/2021 Echocardiogram    1. Left ventricular ejection fraction, by estimation, is 55 to 60%. The left ventricle has normal function. The left ventricle has no regional wall motion abnormalities. Left ventricular diastolic parameters are indeterminate. The average left ventricular global longitudinal strain is -17.3 %. The global longitudinal strain is normal.  2. Right ventricular systolic function is normal. The right ventricular size is normal. There is normal pulmonary artery systolic pressure.  3. Left atrial size was mild to moderately dilated.  4. Right atrial size was mildly dilated.  5. The mitral valve is grossly normal. Trivial mitral valve regurgitation. No evidence of mitral stenosis. Moderate  mitral annular calcification.  6. The aortic valve is grossly normal. There is moderate calcification of the aortic valve. There is moderate thickening of the aortic valve. Aortic valve regurgitation is not visualized. Mild aortic valve stenosis.  7. The inferior vena cava is normal in size with greater than  50% respiratory variability, suggesting right atrial pressure of 3 mmHg.     07/16/2021 -  Chemotherapy   Patient is on Treatment Plan : Uterine Liposomal Doxorubicin q28d      09/27/2021 Imaging   1. Interval decrease in size in bilateral iliac and pelvic sidewall lymph nodes. 2. Tethered appearing soft tissue mass associated with the posterior uterine fundus is slightly diminished in size. 3. Interval decrease in size of a nodule of the body and medial limb of the right adrenal gland. 4. Constellation of findings is consistent with treatment response. 5. Multiple unchanged small sclerotic osseous lesions. 6. Trace bilateral pleural effusions, unchanged   10/05/2021 Echocardiogram    1. Compared to 07/12/21, LV function is slightly worse and GLS has declined.  2. Left ventricular ejection fraction, by estimation, is 50 to 55%. The left ventricle has low normal function. The left ventricle has no regional wall motion abnormalities. Left ventricular diastolic parameters are consistent with Grade I diastolic dysfunction (impaired relaxation). Elevated left atrial pressure. The average left ventricular global longitudinal strain is -7.9 %. The global longitudinal strain is abnormal.  3. Right ventricular systolic function is normal. The right ventricular size is normal.  4. Left atrial size was mildly dilated.  5. The mitral valve is normal in structure. Trivial mitral valve regurgitation. No evidence of mitral stenosis.  6. The aortic valve is tricuspid. Aortic valve regurgitation is not visualized. Aortic valve sclerosis is present, with no evidence of aortic valve stenosis.  7. The inferior vena cava is normal in size with greater than 50% respiratory variability, suggesting right atrial pressure of 3 mmHg.     PHYSICAL EXAMINATION: ECOG PERFORMANCE STATUS: 1 - Symptomatic but completely ambulatory  Vitals:   10/08/21 1155  BP: (!) 162/85  Pulse: 85  Resp: 18  Temp: 98 F (36.7 C)   SpO2: 99%   Filed Weights   10/08/21 1155  Weight: 110 lb (49.9 kg)    GENERAL:alert, no distress and comfortable NEURO: alert & oriented x 3 with fluent speech, no focal motor/sensory deficits  LABORATORY DATA:  I have reviewed the data as listed    Component Value Date/Time   NA 137 10/08/2021 1124   K 3.9 10/08/2021 1124   CL 104 10/08/2021 1124   CO2 27 10/08/2021 1124   GLUCOSE 134 (H) 10/08/2021 1124   BUN 23 10/08/2021 1124   CREATININE 1.45 (H) 10/08/2021 1124   CALCIUM 9.4 10/08/2021 1124   PROT 6.7 10/08/2021 1124   ALBUMIN 3.9 10/08/2021 1124   AST 12 (L) 10/08/2021 1124   ALT 5 10/08/2021 1124   ALKPHOS 43 10/08/2021 1124   BILITOT 0.3 10/08/2021 1124   GFRNONAA 38 (L) 10/08/2021 1124    No results found for: SPEP, UPEP  Lab Results  Component Value Date   WBC 4.0 10/08/2021   NEUTROABS 2.6 10/08/2021   HGB 9.0 (L) 10/08/2021   HCT 27.9 (L) 10/08/2021   MCV 71.7 (L) 10/08/2021   PLT 269 10/08/2021      Chemistry      Component Value Date/Time   NA 137 10/08/2021 1124   K 3.9 10/08/2021 1124   CL 104 10/08/2021 1124  CO2 27 10/08/2021 1124   BUN 23 10/08/2021 1124   CREATININE 1.45 (H) 10/08/2021 1124      Component Value Date/Time   CALCIUM 9.4 10/08/2021 1124   ALKPHOS 43 10/08/2021 1124   AST 12 (L) 10/08/2021 1124   ALT 5 10/08/2021 1124   BILITOT 0.3 10/08/2021 1124       RADIOGRAPHIC STUDIES: I have personally reviewed the radiological images as listed and agreed with the findings in the report. CT CHEST ABDOMEN PELVIS W CONTRAST  Result Date: 09/26/2021 CLINICAL DATA:  Uterine/cervical cancer restaging, metastatic to brain, ongoing chemotherapy * Tracking Code: BO * EXAM: CT CHEST, ABDOMEN, AND PELVIS WITH CONTRAST TECHNIQUE: Multidetector CT imaging of the chest, abdomen and pelvis was performed following the standard protocol during bolus administration of intravenous contrast. RADIATION DOSE REDUCTION: This exam was performed  according to the departmental dose-optimization program which includes automated exposure control, adjustment of the mA and/or kV according to patient size and/or use of iterative reconstruction technique. CONTRAST:  5m OMNIPAQUE IOHEXOL 300 MG/ML SOLN, additional oral enteric contrast COMPARISON:  07/04/2021 FINDINGS: CT CHEST FINDINGS Cardiovascular: Right chest port catheter. Scattered aortic atherosclerosis. Normal heart size. No pericardial effusion. Mediastinum/Nodes: No enlarged mediastinal, hilar, or axillary lymph nodes. Small hiatal hernia. Thyroid gland, trachea, and esophagus demonstrate no significant findings. Lungs/Pleura: Lungs are clear. Trace bilateral pleural effusions, unchanged. Musculoskeletal: No chest wall mass. CT ABDOMEN PELVIS FINDINGS Hepatobiliary: No solid liver abnormality is seen. No gallstones, gallbladder wall thickening, or biliary dilatation. Pancreas: Unremarkable. No pancreatic ductal dilatation or surrounding inflammatory changes. Spleen: Normal in size without significant abnormality. Adrenals/Urinary Tract: Slight interval decrease in size of a nodule of the body and medial limb of the right adrenal gland, measuring 1.6 x 0.9 cm, previously 2.2 x 1.4 cm when measured similarly (series 2, image 45). Kidneys are normal, without renal calculi, solid lesion, or hydronephrosis. Bladder is unremarkable. Stomach/Bowel: Stomach is within normal limits. Appendix appears normal. No evidence of bowel wall thickening, distention, or inflammatory changes. Vascular/Lymphatic: No significant vascular findings are present. Interval decrease in size in bilateral iliac and pelvic sidewall lymph nodes, index right iliac node measuring 1.3 x 0.6 cm, previously 1.8 x 1.5 cm (series 2, image 90), index left iliac node measuring 1.5 x 1.4 cm, previously 2.6 x 2.2 cm (series 2, image 93). Reproductive: Tethered appearing soft tissue mass associated with the posterior uterine fundus is slightly  diminished in size, measuring 2.1 x 2.1 cm, previously 2.6 x 2.1 cm (series 2, image 91). Other: No abdominal wall hernia or abnormality. No ascites. Musculoskeletal: Multiple unchanged small sclerotic lesions, including of the sternal body, inferior endplate of T1, and L2 vertebral body unchanged, subtle superior endplate deformities of T5 and T6 as well as a sclerosis of the L3 vertebral body with superior and inferior endplate deformities (series 6, image 75). IMPRESSION: 1. Interval decrease in size in bilateral iliac and pelvic sidewall lymph nodes. 2. Tethered appearing soft tissue mass associated with the posterior uterine fundus is slightly diminished in size. 3. Interval decrease in size of a nodule of the body and medial limb of the right adrenal gland. 4. Constellation of findings is consistent with treatment response. 5. Multiple unchanged small sclerotic osseous lesions. 6. Trace bilateral pleural effusions, unchanged. Aortic Atherosclerosis (ICD10-I70.0). Electronically Signed   By: ADelanna AhmadiM.D.   On: 09/26/2021 10:03   ECHOCARDIOGRAM COMPLETE  Result Date: 10/04/2021    ECHOCARDIOGRAM REPORT   Patient Name:   Teresa Oliver  Mcquire Date of Exam: 10/04/2021 Medical Rec #:  [id number]       Height:       59.0 in Accession #:    [id number]      Weight:       111.2 lb Date of Birth:  03/20/1946        BSA:          1.437 m Patient Age:    76 years        BP:           185/90 mmHg Patient Gender: F               HR:           67 bpm. Exam Location:  Outpatient Procedure: 2D Echo, Cardiac Doppler, Color Doppler and Strain Analysis Indications:    Z51.11 Encounter for antineoplastic chemotheraphy  History:        Patient has prior history of Echocardiogram examinations, most                 recent 07/12/2021. Risk Factors:Hypertension, CKD stage 3 and                 Non-Smoker. Patient denies chest pain, SOB and leg edema.                 Patient undergoing chemotherapy for brain cancer.  Sonographer:     Salvadore Dom RVT, RDCS (AE), RDMS Referring Phys: [phone] [place] [facility]  Sonographer Comments: Suboptimal apical window. Thin with small rib spaces. IMPRESSIONS  1. Compared to 07/12/21, LV function is slightly worse and GLS has declined.  2. Left ventricular ejection fraction, by estimation, is 50 to 55%. The left ventricle has low normal function. The left ventricle has no regional wall motion abnormalities. Left ventricular diastolic parameters are consistent with Grade I diastolic dysfunction (impaired relaxation). Elevated left atrial pressure. The average left ventricular global longitudinal strain is -7.9 %. The global longitudinal strain is abnormal.  3. Right ventricular systolic function is normal. The right ventricular size is normal.  4. Left atrial size was mildly dilated.  5. The mitral valve is normal in structure. Trivial mitral valve regurgitation. No evidence of mitral stenosis.  6. The aortic valve is tricuspid. Aortic valve regurgitation is not visualized. Aortic valve sclerosis is present, with no evidence of aortic valve stenosis.  7. The inferior vena cava is normal in size with greater than 50% respiratory variability, suggesting right atrial pressure of 3 mmHg. Comparison(s): EF 55%, mod MAC, GLS -17.3%. FINDINGS  Left Ventricle: Left ventricular ejection fraction, by estimation, is 50 to 55%. The left ventricle has low normal function. The left ventricle has no regional wall motion abnormalities. The average left ventricular global longitudinal strain is -7.9 %.  The global longitudinal strain is abnormal. The left ventricular internal cavity size was normal in size. There is no left ventricular hypertrophy. Left ventricular diastolic parameters are consistent with Grade I diastolic dysfunction (impaired relaxation). Elevated left atrial pressure. Right Ventricle: The right ventricular size is normal. Right ventricular systolic function is normal. Left Atrium: Left atrial size was mildly  dilated. Right Atrium: Right atrial size was normal in size. Pericardium: There is no evidence of pericardial effusion. Mitral Valve: The mitral valve is normal in structure. Mild mitral annular calcification. Trivial mitral valve regurgitation. No evidence of mitral valve stenosis. Tricuspid Valve: The tricuspid valve is normal in structure. Tricuspid valve regurgitation is trivial. No evidence of tricuspid stenosis. Aortic Valve:  The aortic valve is tricuspid. Aortic valve regurgitation is not visualized. Aortic valve sclerosis is present, with no evidence of aortic valve stenosis. Aortic valve mean gradient measures 3.0 mmHg. Aortic valve peak gradient measures 6.2  mmHg. Aortic valve area, by VTI measures 1.82 cm. Pulmonic Valve: The pulmonic valve was not well visualized. Pulmonic valve regurgitation is not visualized. No evidence of pulmonic stenosis. Aorta: The aortic root is normal in size and structure. Venous: The inferior vena cava is normal in size with greater than 50% respiratory variability, suggesting right atrial pressure of 3 mmHg. IAS/Shunts: No atrial level shunt detected by color flow Doppler. Additional Comments: Compared to 07/12/21, LV function is slightly worse and GLS has declined.  LEFT VENTRICLE PLAX 2D LVIDd:         4.28 cm     Diastology LVIDs:         2.71 cm     LV e' medial:    2.89 cm/s LV PW:         0.80 cm     LV E/e' medial:  16.7 LV IVS:        0.73 cm     LV e' lateral:   3.64 cm/s LVOT diam:     2.00 cm     LV E/e' lateral: 13.2 LV SV:         47 LV SV Index:   33          2D Longitudinal Strain LVOT Area:     3.14 cm    2D Strain GLS Avg:     -7.9 %  LV Volumes (MOD) LV vol d, MOD A2C: 55.8 ml 3D Volume EF: LV vol d, MOD A4C: 59.8 ml 3D EF:        58 % LV vol s, MOD A2C: 29.6 ml LV EDV:       88 ml LV vol s, MOD A4C: 23.2 ml LV ESV:       37 ml LV SV MOD A2C:     26.2 ml LV SV:        51 ml LV SV MOD A4C:     59.8 ml LV SV MOD BP:      33.5 ml RIGHT VENTRICLE RV S prime:      10.40 cm/s TAPSE (M-mode): 1.7 cm LEFT ATRIUM             Index        RIGHT ATRIUM           Index LA diam:        3.00 cm 2.09 cm/m   RA Area:     11.90 cm LA Vol (A2C):   52.5 ml 36.53 ml/m  RA Volume:   31.10 ml  21.64 ml/m LA Vol (A4C):   54.0 ml 37.57 ml/m LA Biplane Vol: 53.5 ml 37.23 ml/m  AORTIC VALVE                    PULMONIC VALVE AV Area (Vmax):    2.38 cm     PV Vmax:          0.72 m/s AV Area (Vmean):   1.87 cm     PV Peak grad:     2.1 mmHg AV Area (VTI):     1.82 cm     PR End Diast Vel: 0.97 msec AV Vmax:           125.00 cm/s AV Vmean:  88.700 cm/s AV VTI:            0.259 m AV Peak Grad:      6.2 mmHg AV Mean Grad:      3.0 mmHg LVOT Vmax:         94.70 cm/s LVOT Vmean:        52.900 cm/s LVOT VTI:          0.150 m LVOT/AV VTI ratio: 0.58  AORTA Ao Root diam: 3.20 cm Ao Arch diam: 2.8 cm MITRAL VALVE               TRICUSPID VALVE MV Area (PHT): 1.62 cm    TR Peak grad:   19.9 mmHg MV Decel Time: 468 msec    TR Vmax:        223.00 cm/s MV E velocity: 48.20 cm/s MV A velocity: 89.60 cm/s  SHUNTS MV E/A ratio:  0.54        Systemic VTI:  0.15 m                            Systemic Diam: 2.00 cm Kirk Ruths MD Electronically signed by Kirk Ruths MD Signature Date/Time: 10/04/2021/4:08:26 PM    Final

## 2021-10-08 NOTE — Assessment & Plan Note (Signed)
Her blood pressure is not elevated I recommend addition of losartan

## 2021-10-19 ENCOUNTER — Other Ambulatory Visit: Payer: Self-pay | Admitting: Radiation Therapy

## 2021-11-02 MED FILL — Dexamethasone Sodium Phosphate Inj 100 MG/10ML: INTRAMUSCULAR | Qty: 1 | Status: AC

## 2021-11-05 ENCOUNTER — Encounter: Payer: Self-pay | Admitting: Hematology and Oncology

## 2021-11-05 ENCOUNTER — Inpatient Hospital Stay: Payer: Medicare Other

## 2021-11-05 ENCOUNTER — Other Ambulatory Visit: Payer: Self-pay

## 2021-11-05 ENCOUNTER — Inpatient Hospital Stay: Payer: Medicare Other | Attending: Hematology and Oncology | Admitting: Hematology and Oncology

## 2021-11-05 VITALS — BP 182/115 | HR 71 | Temp 97.6°F | Resp 18

## 2021-11-05 DIAGNOSIS — C55 Malignant neoplasm of uterus, part unspecified: Secondary | ICD-10-CM | POA: Diagnosis present

## 2021-11-05 DIAGNOSIS — I129 Hypertensive chronic kidney disease with stage 1 through stage 4 chronic kidney disease, or unspecified chronic kidney disease: Secondary | ICD-10-CM | POA: Insufficient documentation

## 2021-11-05 DIAGNOSIS — Z79899 Other long term (current) drug therapy: Secondary | ICD-10-CM | POA: Diagnosis not present

## 2021-11-05 DIAGNOSIS — C549 Malignant neoplasm of corpus uteri, unspecified: Secondary | ICD-10-CM

## 2021-11-05 DIAGNOSIS — D638 Anemia in other chronic diseases classified elsewhere: Secondary | ICD-10-CM | POA: Insufficient documentation

## 2021-11-05 DIAGNOSIS — D569 Thalassemia, unspecified: Secondary | ICD-10-CM | POA: Diagnosis not present

## 2021-11-05 DIAGNOSIS — C7931 Secondary malignant neoplasm of brain: Secondary | ICD-10-CM | POA: Diagnosis not present

## 2021-11-05 DIAGNOSIS — N183 Chronic kidney disease, stage 3 unspecified: Secondary | ICD-10-CM

## 2021-11-05 DIAGNOSIS — D539 Nutritional anemia, unspecified: Secondary | ICD-10-CM

## 2021-11-05 DIAGNOSIS — I10 Essential (primary) hypertension: Secondary | ICD-10-CM

## 2021-11-05 DIAGNOSIS — Z5111 Encounter for antineoplastic chemotherapy: Secondary | ICD-10-CM | POA: Diagnosis present

## 2021-11-05 LAB — CBC WITH DIFFERENTIAL (CANCER CENTER ONLY)
Abs Immature Granulocytes: 0.1 10*3/uL — ABNORMAL HIGH (ref 0.00–0.07)
Basophils Absolute: 0 10*3/uL (ref 0.0–0.1)
Basophils Relative: 1 %
Eosinophils Absolute: 0.1 10*3/uL (ref 0.0–0.5)
Eosinophils Relative: 1 %
HCT: 26.2 % — ABNORMAL LOW (ref 36.0–46.0)
Hemoglobin: 8.4 g/dL — ABNORMAL LOW (ref 12.0–15.0)
Immature Granulocytes: 2 %
Lymphocytes Relative: 18 %
Lymphs Abs: 0.8 10*3/uL (ref 0.7–4.0)
MCH: 23.1 pg — ABNORMAL LOW (ref 26.0–34.0)
MCHC: 32.1 g/dL (ref 30.0–36.0)
MCV: 72 fL — ABNORMAL LOW (ref 80.0–100.0)
Monocytes Absolute: 0.6 10*3/uL (ref 0.1–1.0)
Monocytes Relative: 14 %
Neutro Abs: 3 10*3/uL (ref 1.7–7.7)
Neutrophils Relative %: 64 %
Platelet Count: 274 10*3/uL (ref 150–400)
RBC: 3.64 MIL/uL — ABNORMAL LOW (ref 3.87–5.11)
RDW: 15.9 % — ABNORMAL HIGH (ref 11.5–15.5)
WBC Count: 4.6 10*3/uL (ref 4.0–10.5)
nRBC: 0 % (ref 0.0–0.2)

## 2021-11-05 LAB — CMP (CANCER CENTER ONLY)
ALT: <5 U/L (ref 0–44)
AST: 12 U/L — ABNORMAL LOW (ref 15–41)
Albumin: 3.8 g/dL (ref 3.5–5.0)
Alkaline Phosphatase: 47 U/L (ref 38–126)
Anion gap: 6 (ref 5–15)
BUN: 22 mg/dL (ref 8–23)
CO2: 26 mmol/L (ref 22–32)
Calcium: 9.6 mg/dL (ref 8.9–10.3)
Chloride: 104 mmol/L (ref 98–111)
Creatinine: 1.27 mg/dL — ABNORMAL HIGH (ref 0.44–1.00)
GFR, Estimated: 44 mL/min — ABNORMAL LOW (ref >60)
Glucose, Bld: 114 mg/dL — ABNORMAL HIGH (ref 70–99)
Potassium: 4.1 mmol/L (ref 3.5–5.1)
Sodium: 136 mmol/L (ref 135–145)
Total Bilirubin: 0.3 mg/dL (ref 0.3–1.2)
Total Protein: 6.5 g/dL (ref 6.5–8.1)

## 2021-11-05 MED ORDER — DEXTROSE 5 % IV SOLN: Freq: Once | INTRAVENOUS | Status: AC

## 2021-11-05 MED ORDER — HEPARIN SOD (PORK) LOCK FLUSH 100 UNIT/ML IV SOLN: 500.0000 [IU] | Freq: Once | INTRAVENOUS | Status: DC | PRN

## 2021-11-05 MED ORDER — DOXORUBICIN HCL LIPOSOMAL CHEMO INJECTION 2 MG/ML
42.0000 mg/m2 | Freq: Once | INTRAVENOUS | Status: AC
  Administered 2021-11-05: 60 mg via INTRAVENOUS
  Filled 2021-11-05: qty 30

## 2021-11-05 MED ORDER — SODIUM CHLORIDE 0.9% FLUSH: 10.0000 mL | INTRAVENOUS | Status: DC | PRN

## 2021-11-05 MED ORDER — ATENOLOL 50 MG PO TABS: 25.0000 mg | ORAL_TABLET | Freq: Two times a day (BID) | ORAL | 0 refills | Status: AC

## 2021-11-05 MED ORDER — SODIUM CHLORIDE 0.9% FLUSH
10.0000 mL | Freq: Once | INTRAVENOUS | Status: AC
  Administered 2021-11-05: 10 mL

## 2021-11-05 MED ORDER — SODIUM CHLORIDE 0.9 % IV SOLN
10.0000 mg | Freq: Once | INTRAVENOUS | Status: AC
  Administered 2021-11-05: 10 mg via INTRAVENOUS
  Filled 2021-11-05: qty 10

## 2021-11-05 NOTE — Progress Notes (Signed)
[place] OFFICE PROGRESS NOTE  Patient Care Team: [name], MD as PCP - General (Hematology and Oncology)  ASSESSMENT & PLAN:  Uterine cancer [facility]) Her last CT imaging showed excellent response to therapy She had very mild mucositis with recent treatment that resolved spontaneously I plan to continue systemic treatment for few more months with plan to repeat imaging study again in August ECHO shows slight changes in ejection fraction but overall still within normal range I will plan to repeat echo again in August  Essential hypertension She has uncontrolled hypertension lately We will proceed with treatment as scheduled I recommend her to take atenolol at 25 mg twice daily We will continue aggressive blood pressure management  CKD (chronic kidney disease), stage III ([place]) She has slight intermittent elevated creatinine We discussed importance of adequate hydration  Deficiency anemia She has multifactorial anemia, combination of thalassemia and recent anemia chronic illness Her blood count has stabilized since we switch her treatment Monitor closely She denies recent bleeding  No orders of the defined types were placed in this encounter.   All questions were answered. The patient knows to call the clinic with any problems, questions or concerns. The total time spent in the appointment was 30 minutes encounter with patients including review of chart and various tests results, discussions about plan of care and coordination of care plan   [name], MD 11/05/2021 11:30 AM  INTERVAL HISTORY: Please see below for problem oriented charting. she returns for treatment follow-up with her daughter, for cycle 5 of treatment She has lost some weight and admits she is not drinking enough fluid I reviewed her documented blood pressure from home Majority of her blood pressure is very high, ranging between 130s to 160s Her heart rate is stable above 60 She denies any  headache or changes in her vision or new neurological deficits She had very mild mucositis that resolved spontaneously  REVIEW OF SYSTEMS:   Constitutional: Denies fevers, chills or abnormal weight loss Eyes: Denies blurriness of vision Respiratory: Denies cough, dyspnea or wheezes Cardiovascular: Denies palpitation, chest discomfort or lower extremity swelling Gastrointestinal:  Denies nausea, heartburn or change in bowel habits Skin: Denies abnormal skin rashes Lymphatics: Denies new lymphadenopathy or easy bruising Neurological:Denies numbness, tingling or new weaknesses Behavioral/Psych: Mood is stable, no new changes  All other systems were reviewed with the patient and are negative.  I have reviewed the past medical history, past surgical history, social history and family history with the patient and they are unchanged from previous note.  ALLERGIES:  has No Known Allergies.  MEDICATIONS:  Current Outpatient Medications  Medication Sig Dispense Refill   atenolol (TENORMIN) 50 MG tablet Take 0.5 tablets (25 mg total) by mouth 2 (two) times daily. 90 tablet 0   cholecalciferol (VITAMIN D3) 25 MCG (1000 UNIT) tablet Take 1,000 Units by mouth daily.     doxazosin (CARDURA) 4 MG tablet TAKE 1 TABLET(4 MG) BY MOUTH DAILY (Patient not taking: Reported on 09/18/2021) 30 tablet 3   lidocaine-prilocaine (EMLA) cream Apply 1 application topically daily as needed. 30 g 3   LORazepam (ATIVAN) 0.5 MG tablet TAKE 1 TABLET(0.5 MG) BY MOUTH TWICE DAILY AS NEEDED FOR ANXIETY (Patient taking differently: Take 0.5 mg by mouth 2 (two) times daily as needed for anxiety.) 60 tablet 0   losartan (COZAAR) 25 MG tablet Take 1 tablet (25 mg total) by mouth daily. 30 tablet 1   ondansetron (ZOFRAN) 8 MG tablet Take 1 tablet (  8 mg total) by mouth every 8 (eight) hours as needed for nausea. 30 tablet 3   prochlorperazine (COMPAZINE) 10 MG tablet Take 1 tablet (10 mg total) by mouth every 6 (six) hours as needed  for nausea or vomiting. 30 tablet 1   No current facility-administered medications for this visit.   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 Pelvic mass measuring 7.5 x 4.8 cm, contiguous with the sigmoid colon and uterus with loss of fat planes. Site of origin is unclear, may be ovarian, uterine, or colonic. Lack of contrast limits detailed assessment. 2. Above pelvic mass causes partial obstruction of the left ureter with mild left hydroureteronephrosis. 3. Multifocal adenopathy in the abdomen and pelvis, suspicious for metastatic disease, primarily in the mesentery and lower peritoneum and pelvis. Please note that detailed assessment of adenopathy is limited on this noncontrast exam. Recommend oncology referral.  4. Wall thickening of the sigmoid colon distal to the left pelvic mass with pericolonic edema, suspicious for colitis. There is a moderate to large amount of stool in the more proximal colon, suggesting pelvic mass may be causing delayed colonic transit. 5. Small to moderate hiatal hernia.   08/28/2020 - 09/04/2020 Hospital Admission   The patient has been complaining of reduced urine output, loss of appetite and changes in bowel habits She had abnormal imaging study leading to hospitalization, colonoscopy, biopsy and subsequently chemotherapy   08/30/2020  Procedure   CT-guided biopsy of enlarged left external iliac chain lymph node as above   08/30/2020 Pathology Results   SURGICAL PATHOLOGY  CASE: [id number]  PATIENT: Teresa Oliver  Surgical Pathology Report  Clinical History: Pelvic mass with metastatic lymph adenopathy (crm)  FINAL MICROSCOPIC DIAGNOSIS:   A. LYMPH NODE, LEFT EXTERNAL ILIAC CHAIN, BIOPSY:  - Poorly differentiated carcinoma with necrosis.  - See comment.   COMMENT:  The carcinoma is characterized by diffuse sheets of malignant epithelioid cells with prominent nucleoli and frequent mitotic figures. Immunohistochemistry is positive with cytokeratin 7 and PAX 8 with weak  positivity with estrogen receptor and WT1.  P53 shows diffuse strong nuclear positivity.  The carcinoma is negative with cytokeratin 20, CDX2, GATA3, GCDFP, TTF-1, HepPar 1, arginase 1 and CD10.  The  morphology and immunophenotype are most consistent with a gynecologic primary including high-grade serous carcinoma.    08/31/2020 Procedure   Colonoscopy - Preparation of the colon was fair. - Likely malignant partially obstructing tumor in the sigmoid colon (appears extrinsic process). Biopsied. Tattooed. - Internal hemorrhoids. - Stool in the entire examined colon   09/01/2020 Initial Diagnosis   Uterine cancer ([place])   09/01/2020 Procedure   Successful placement of a power injectable Port-A-Cath via the right internal jugular vein. The catheter is ready for immediate use.   09/04/2020 - 12/29/2020 Chemotherapy  Patient is on Treatment Plan: UTERINE CARBOPLATIN AUC 6 / PACLITAXEL Q21D       11/15/2020 Imaging   1. Marked interval response to therapy with decrease in size of the mass extending from the uterus to the colon that was seen on the previous examination. Also with decreased size and or resolution of pelvic, mesenteric and retroperitoneal adenopathy seen on previous imaging. 2. Tethering of the colon with area that raise the question of  developing colovaginal fistula versus internal enhancement within the mass that is now decreased in size. Attention on follow-up is suggested. Correlate with any current signs of vaginal drainage. 3. LEFT thoracic inlet adenopathy compatible with involvement in the chest. Area not imaged on previous imaging.   02/18/2021 Imaging   1. Interval progression of left supraclavicular,, left thoracic inlet, left subpectoral, retroperitoneal, mesenteric and pelvic sidewall lymphadenopathy. Metastatic deposits in the central pelvis are also progressive. 2. Interval progression of left subpectoral lymphadenopathy with thrombus now visible in the left subclavian/innominate vein. 3. Aortic Atherosclerosis (ICD10-I70.0).   02/23/2021 - 06/15/2021 Chemotherapy   Patient is on Treatment Plan : UTERINE Lenvatinib + Pembrolizumab q21d     05/23/2021 Imaging   1. Decreased size of pelvic soft tissue lesions. 2. Decreased size of left supraclavicular and left subpectoral lymph nodes. 3. Overall decreased size of abdominal and pelvic lymph nodes; however, a few left-sided pelvic and inguinal lymph nodes have increased in size. 4. New mild compression deformity of the inferior endplate of L3. 5.  Aortic Atherosclerosis (ICD10-I70.0).     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  Operating microscope.,  Microdissection technique. Surgeon: Kristeen Miss First Assistant: Consuella Lose MD    06/01/2021 Pathology Results   SURGICAL PATHOLOGY  CASE: [id number]  PATIENT: Teresa Oliver  Surgical Pathology Report    FINAL MICROSCOPIC DIAGNOSIS:   A. BRAIN TUMOR, RIGHT CEREBELLAR, RESECTION:  Poorly differentiated carcinoma with morphology and immunoprofile consistent with metastatic previously diagnosed  high-grade serous carcinoma.  Clinical correlation is required.   COMMENT:   Immunohistochemistry with appropriate controls for single antibody is positive for CK7, PAX8, WT1 and p53 and is negative for GFAP, CK20 and ER.    07/05/2021 Cancer Staging   Staging form: Corpus Uteri - Carcinoma and Carcinosarcoma, AJCC 8th Edition - Clinical stage from 07/05/2021: FIGO Stage IVB (rcT4, cN2a, pM1) - Signed by [name], MD on 07/05/2021 Stage prefix: Recurrence   07/05/2021 Imaging   1. Today's study demonstrates definitive progression of disease, as evidenced by enlarging mass associated with the posterior aspect of the uterine fundus, which is intimately associated with loops of small bowel and sigmoid colon, worsening pelvic lymphadenopathy, possible adrenal metastasis, and multifocal osseous metastases, as detailed above. 2. Additional incidental findings, as above.       07/11/2021 Imaging   MRI brain 1. Postsurgical changes from right posterior fossa tumor resection with new focus of contrast enhancement at the right cerebellar hemisphere surgical cavity margins, concerning for recurrence. 2. 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 3. A 5 mm new lesion in the right cerebellar hemisphere. 4. Decrease in size of the previously described left frontal and parietal lesions with resolution of punctate contrast enhancement in the left cerebellar hemisphere.   07/12/2021 Echocardiogram    1. Left ventricular ejection fraction, by estimation,  is 55 to 60%. The left ventricle has normal function. The left ventricle has no regional wall motion abnormalities. Left ventricular diastolic parameters are indeterminate. The average left ventricular global longitudinal strain is -17.3 %. The global longitudinal strain is normal.  2. Right ventricular systolic function is normal. The right ventricular size is normal. There is normal pulmonary  artery systolic pressure.  3. Left atrial size was mild to moderately dilated.  4. Right atrial size was mildly dilated.  5. The mitral valve is grossly normal. Trivial mitral valve regurgitation. No evidence of mitral stenosis. Moderate mitral annular calcification.  6. The aortic valve is grossly normal. There is moderate calcification of the aortic valve. There is moderate thickening of the aortic valve. Aortic valve regurgitation is not visualized. Mild aortic valve stenosis.  7. The inferior vena cava is normal in size with greater than 50% respiratory variability, suggesting right atrial pressure of 3 mmHg.     07/16/2021 -  Chemotherapy   Patient is on Treatment Plan : Uterine Liposomal Doxorubicin q28d     09/27/2021 Imaging   1. Interval decrease in size in bilateral iliac and pelvic sidewall lymph nodes. 2. Tethered appearing soft tissue mass associated with the posterior uterine fundus is slightly diminished in size. 3. Interval decrease in size of a nodule of the body and medial limb of the right adrenal gland. 4. Constellation of findings is consistent with treatment response. 5. Multiple unchanged small sclerotic osseous lesions. 6. Trace bilateral pleural effusions, unchanged   10/05/2021 Echocardiogram    1. Compared to 07/12/21, LV function is slightly worse and GLS has declined.  2. Left ventricular ejection fraction, by estimation, is 50 to 55%. The left ventricle has low normal function. The left ventricle has no regional wall motion abnormalities. Left ventricular diastolic parameters are consistent with Grade I diastolic dysfunction (impaired relaxation). Elevated left atrial pressure. The average left ventricular global longitudinal strain is -7.9 %. The global longitudinal strain is abnormal.  3. Right ventricular systolic function is normal. The right ventricular size is normal.  4. Left atrial size was mildly dilated.  5. The mitral valve is normal in structure. Trivial  mitral valve regurgitation. No evidence of mitral stenosis.  6. The aortic valve is tricuspid. Aortic valve regurgitation is not visualized. Aortic valve sclerosis is present, with no evidence of aortic valve stenosis.  7. The inferior vena cava is normal in size with greater than 50% respiratory variability, suggesting right atrial pressure of 3 mmHg.     PHYSICAL EXAMINATION: ECOG PERFORMANCE STATUS: 1 - Symptomatic but completely ambulatory  Vitals:   11/05/21 1016  BP: (!) 177/103  Pulse: 75  Resp: 18  Temp: 97.6 F (36.4 C)  SpO2: 99%   Filed Weights   11/05/21 1016  Weight: 109 lb (49.4 kg)    GENERAL:alert, no distress and comfortable NEURO: alert & oriented x 3 with fluent speech, no focal motor/sensory deficits  LABORATORY DATA:  I have reviewed the data as listed    Component Value Date/Time   NA 136 11/05/2021 0958   K 4.1 11/05/2021 0958   CL 104 11/05/2021 0958   CO2 26 11/05/2021 0958   GLUCOSE 114 (H) 11/05/2021 0958   BUN 22 11/05/2021 0958   CREATININE 1.27 (H) 11/05/2021 0958   CALCIUM 9.6 11/05/2021 0958   PROT 6.5 11/05/2021 0958   ALBUMIN 3.8 11/05/2021 0958   AST 12 (L) 11/05/2021 0958   ALT <5 11/05/2021 0958   ALKPHOS 47 11/05/2021  0958   BILITOT 0.3 11/05/2021 0958   GFRNONAA 44 (L) 11/05/2021 0958    No results found for: "SPEP", "UPEP"  Lab Results  Component Value Date   WBC 4.6 11/05/2021   NEUTROABS 3.0 11/05/2021   HGB 8.4 (L) 11/05/2021   HCT 26.2 (L) 11/05/2021   MCV 72.0 (L) 11/05/2021   PLT 274 11/05/2021      Chemistry      Component Value Date/Time   NA 136 11/05/2021 0958   K 4.1 11/05/2021 0958   CL 104 11/05/2021 0958   CO2 26 11/05/2021 0958   BUN 22 11/05/2021 0958   CREATININE 1.27 (H) 11/05/2021 0958      Component Value Date/Time   CALCIUM 9.6 11/05/2021 0958   ALKPHOS 47 11/05/2021 0958   AST 12 (L) 11/05/2021 0958   ALT <5 11/05/2021 0958   BILITOT 0.3 11/05/2021 1282

## 2021-11-05 NOTE — Assessment & Plan Note (Signed)
She has uncontrolled hypertension lately We will proceed with treatment as scheduled I recommend her to take atenolol at 25 mg twice daily We will continue aggressive blood pressure management

## 2021-11-05 NOTE — Assessment & Plan Note (Signed)
She has multifactorial anemia, combination of thalassemia and recent anemia chronic illness Her blood count has stabilized since we switch her treatment Monitor closely She denies recent bleeding

## 2021-11-05 NOTE — Assessment & Plan Note (Signed)
Her last CT imaging showed excellent response to therapy She had very mild mucositis with recent treatment that resolved spontaneously I plan to continue systemic treatment for few more months with plan to repeat imaging study again in August ECHO shows slight changes in ejection fraction but overall still within normal range I will plan to repeat echo again in August 

## 2021-11-05 NOTE — Patient Instructions (Signed)
[facility] MEDICAL ONCOLOGY  Discharge Instructions: ?Thank you for choosing [facility] to provide your oncology and hematology care.  ? ?If you have a lab appointment with the Cancer Center, please go directly to the Cancer Center and check in at the registration area. ?  ?Wear comfortable clothing and clothing appropriate for easy access to any Portacath or PICC line.  ? ?We strive to give you quality time with your provider. You may need to reschedule your appointment if you arrive late (15 or more minutes).  Arriving late affects you and other patients whose appointments are after yours.  Also, if you miss three or more appointments without notifying the office, you may be dismissed from the clinic at the provider?s discretion.    ?  ?For prescription refill requests, have your pharmacy contact our office and allow 72 hours for refills to be completed.   ? ?Today you received the following chemotherapy and/or immunotherapy agents : Doxil    ?  ?To help prevent nausea and vomiting after your treatment, we encourage you to take your nausea medication as directed. ? ?BELOW ARE SYMPTOMS THAT SHOULD BE REPORTED IMMEDIATELY: ?*FEVER GREATER THAN 100.4 F (38 ?C) OR HIGHER ?*CHILLS OR SWEATING ?*NAUSEA AND VOMITING THAT IS NOT CONTROLLED WITH YOUR NAUSEA MEDICATION ?*UNUSUAL SHORTNESS OF BREATH ?*UNUSUAL BRUISING OR BLEEDING ?*URINARY PROBLEMS (pain or burning when urinating, or frequent urination) ?*BOWEL PROBLEMS (unusual diarrhea, constipation, pain near the anus) ?TENDERNESS IN MOUTH AND THROAT WITH OR WITHOUT PRESENCE OF ULCERS (sore throat, sores in mouth, or a toothache) ?UNUSUAL RASH, SWELLING OR PAIN  ?UNUSUAL VAGINAL DISCHARGE OR ITCHING  ? ?Items with * indicate a potential emergency and should be followed up as soon as possible or go to the Emergency Department if any problems should occur. ? ?Please show the CHEMOTHERAPY ALERT CARD or IMMUNOTHERAPY ALERT CARD at check-in to the  Emergency Department and triage nurse. ? ?Should you have questions after your visit or need to cancel or reschedule your appointment, please contact [facility]  Dept: [phone]  and follow the prompts.  Office hours are 8:00 a.m. to 4:30 p.m. Monday - Friday. Please note that voicemails left after 4:00 p.m. may not be returned until the following business day.  We are closed weekends and major holidays. You have access to a nurse at all times for urgent questions. Please call the main number to the clinic Dept: [phone] and follow the prompts. ? ? ?For any non-urgent questions, you may also contact your provider using MyChart. We now offer e-Visits for anyone 18 and older to request care online for non-urgent symptoms. For details visit [email or web address]. ?  ?Also download the MyChart app! Go to the app store, search "MyChart", open the app, select , and log in with your MyChart username and password. ? ?Due to Covid, a mask is required upon entering the hospital/clinic. If you do not have a mask, one will be given to you upon arrival. For doctor visits, patients may have 1 support person aged 18 or older with them. For treatment visits, patients cannot have anyone with them due to current Covid guidelines and our immunocompromised population.  ? ?

## 2021-11-05 NOTE — Progress Notes (Signed)
Per dated note in treatment plan- ok to proceed withy treatment today with high BP. Ok to treat with ECHO of 50-55% per Dr. Alvy Bimler.

## 2021-11-05 NOTE — Assessment & Plan Note (Signed)
She has slight intermittent elevated creatinine We discussed importance of adequate hydration 

## 2021-11-15 ENCOUNTER — Other Ambulatory Visit: Payer: Medicare Other

## 2021-11-15 ENCOUNTER — Other Ambulatory Visit: Payer: Self-pay | Admitting: Hematology and Oncology

## 2021-11-19 ENCOUNTER — Other Ambulatory Visit: Payer: Self-pay | Admitting: *Deleted

## 2021-11-19 ENCOUNTER — Ambulatory Visit
Admission: RE | Admit: 2021-11-19 | Discharge: 2021-11-19 | Disposition: A | Discharge status: Home / Self Care | Payer: Medicare Other | Source: Ambulatory Visit | Attending: Radiation Oncology | Admitting: Radiation Oncology

## 2021-11-19 ENCOUNTER — Other Ambulatory Visit: Payer: Self-pay

## 2021-11-19 ENCOUNTER — Encounter: Payer: Self-pay | Admitting: Urology

## 2021-11-19 DIAGNOSIS — C7931 Secondary malignant neoplasm of brain: Secondary | ICD-10-CM | POA: Diagnosis not present

## 2021-11-19 DIAGNOSIS — J3489 Other specified disorders of nose and nasal sinuses: Secondary | ICD-10-CM | POA: Diagnosis not present

## 2021-11-19 MED ORDER — HEPARIN SOD (PORK) LOCK FLUSH 100 UNIT/ML IV SOLN
500.0000 [IU] | Freq: Once | INTRAVENOUS | Status: AC
  Administered 2021-11-19: 500 [IU] via INTRAVENOUS

## 2021-11-19 MED ORDER — SODIUM CHLORIDE 0.9% FLUSH
10.0000 mL | INTRAVENOUS | Status: DC | PRN
  Administered 2021-11-19: 10 mL via INTRAVENOUS

## 2021-11-19 MED ORDER — GADOBENATE DIMEGLUMINE 529 MG/ML IV SOLN
11.0000 mL | Freq: Once | INTRAVENOUS | Status: AC | PRN
  Administered 2021-11-19: 11 mL via INTRAVENOUS

## 2021-11-19 NOTE — Progress Notes (Signed)
Telephone appointment. Patient identity verified. Patient reports rare, occasional sharp pain in bilateral feet 2/10. No other issues reported at this time.  Meaningful use complete.  Reminded patient of her 9:00am-11/21/21 telephone appointment w/ Ashlyn Bruning PA-C. I left my extension [phone] in case patient needs anything. Patient verbalized understanding.  Patient contact [phone]

## 2021-11-21 ENCOUNTER — Ambulatory Visit
Admission: RE | Admit: 2021-11-21 | Discharge: 2021-11-21 | Disposition: A | Discharge status: Home / Self Care | Payer: Medicare Other | Source: Ambulatory Visit | Attending: Urology | Admitting: Urology

## 2021-11-21 DIAGNOSIS — C7931 Secondary malignant neoplasm of brain: Secondary | ICD-10-CM | POA: Diagnosis not present

## 2021-11-21 NOTE — Progress Notes (Signed)
Radiation Oncology         [phone] ________________________________  Name: Teresa Oliver MRN: [id number]  Date: 11/21/2021  DOB: [birth year]  Post Treatment Note  CC: [name], MD  [facility], [facility] *  Diagnosis:   76 y.o. female with metastatic brain disease, likely leptomeningeal spread, secondary to metastatic uterine cancer   Interval Since Last Radiation:  3 months  07/31/21 - 08/13/21:   The whole brain was treated to 30 Gy in 10 fractions of 3 Gy  05/31/21: SRS brain// PTV1 (pre-op) treated to 15 Gy in a single fraction; PTV2-5 treated to 20 Gy in a single fraction:    Narrative:  I spoke with the patient to conduct her routine scheduled 1 month follow up visit via telephone to spare the patient unnecessary potential exposure in the healthcare setting during the current COVID-19 pandemic.  The patient was notified in advance and gave permission to proceed with this visit format.  She tolerated radiation treatment relatively well with only modest fatigue, mild nausea and some scalp Irritation.  Her posttreatment MRI brain scan performed on 11/19/2021 shows an excellent response to treatment with the parenchymal and leptomeningeal enhancement seen on the prior study from February 2023, now resolved and no new lesions identified.  There are postsurgical changes reflecting right posterior fossa mass resection with decreased but not entirely resolved enhancement along the medial margin of the resection cavity, likely treatment response.                          On review of systems, the patient states that she is doing very well in general.  She reports that the scalp irritation has resolved and she denies any headaches, changes in her visual or auditory acuity, dizziness, tremor or seizure activity.  She continues with modest fatigue and occasional instances of imbalance when she changes position too quickly but overall, is pleased with her progress to date.  She admits that she does  not drink enough fluids regularly and has a tendency to get dehydrated so she is going to make more of a concerted effort to stay hydrated, especially on these hot summer days. She continues to tolerate the systemic therapy with doxorubicin fairly well and has now completed 5 cycles as of 09/27/2021.  Her restaging CT C/A/P performed on 09/27/2021 also shows an excellent response to treatment and no evidence of new or progressive disease so the current plan is to continue with single agent doxorubicin for a few more cycles and repeat imaging in August 2023.  ALLERGIES:  has No Known Allergies.  Meds: Current Outpatient Medications  Medication Sig Dispense Refill   atenolol (TENORMIN) 50 MG tablet Take 0.5 tablets (25 mg total) by mouth 2 (two) times daily. 90 tablet 0   cholecalciferol (VITAMIN D3) 25 MCG (1000 UNIT) tablet Take 1,000 Units by mouth daily.     losartan (COZAAR) 25 MG tablet Take 1 tablet (25 mg total) by mouth daily. 30 tablet 1   ondansetron (ZOFRAN) 8 MG tablet Take 1 tablet (8 mg total) by mouth every 8 (eight) hours as needed for nausea. 30 tablet 3   prochlorperazine (COMPAZINE) 10 MG tablet Take 1 tablet (10 mg total) by mouth every 6 (six) hours as needed for nausea or vomiting. 30 tablet 1   doxazosin (CARDURA) 4 MG tablet TAKE 1 TABLET(4 MG) BY MOUTH DAILY (Patient not taking: Reported on 09/18/2021) 30 tablet 3   lidocaine-prilocaine (EMLA) cream  Apply 1 application topically daily as needed. 30 g 3   LORazepam (ATIVAN) 0.5 MG tablet TAKE 1 TABLET(0.5 MG) BY MOUTH TWICE DAILY AS NEEDED FOR ANXIETY (Patient taking differently: Take 0.5 mg by mouth 2 (two) times daily as needed for anxiety.) 60 tablet 0   No current facility-administered medications for this encounter.    Physical Findings:  vitals were not taken for this visit.  Pain Assessment Pain Score: 2  (Bilateral feet)/10 Unable to assess due to telephone follow-up visit format.  Lab Findings: Lab Results   Component Value Date   WBC 4.6 11/05/2021   HGB 8.4 (L) 11/05/2021   HCT 26.2 (L) 11/05/2021   MCV 72.0 (L) 11/05/2021   PLT 274 11/05/2021     Radiographic Findings: MR Brain W Wo Contrast  Result Date: 11/20/2021 CLINICAL DATA:  Brain metastasis, assess treatment response after whole-brain radiation completed 08/14/2018 EXAM: MRI HEAD WITHOUT AND WITH CONTRAST TECHNIQUE: Multiplanar, multiecho pulse sequences of the brain and surrounding structures were obtained without and with intravenous contrast. CONTRAST:  56m MULTIHANCE GADOBENATE DIMEGLUMINE 529 MG/ML IV SOLN COMPARISON:  Brain MRI 07/11/2018 FINDINGS: Brain: Postsurgical changes reflecting right posterior fossa mass resection are again seen. A small amount of chronic blood products are noted at the periphery of the resection cavity. The cavity is similar in size to the prior study from 07/16/2021. Previously seen nodular enhancement along the medial aspect of the resection cavity has decreased in size but has not entirely resolved, with persistent linear enhancement (11-38). FLAIR signal abnormality posterior to the resection margin has slightly increased (8-20). There is no significant mass effect Previously seen foci of enhancement in the left frontal and bilateral parietal lobes and inferior right cerebellar hemisphere are no longer seen. There are no new lesions The ventricles are stable in size. There is progressive confluent FLAIR signal abnormality in both cerebral hemispheres likely reflecting a combination of background chronic white matter microangiopathy and evolving postradiation change. The ventricles are stable in size. Remote infarcts in the bilateral cerebellar hemispheres are unchanged. There is no evidence of acute infarct intracranial hemorrhage, or extra-axial fluid collection. Vascular: Normal flow voids. Skull and upper cervical spine: Postsurgical changes as above. There is no suspicious marrow signal abnormality.  Sinuses/Orbits: There is mild mucosal thickening in the paranasal sinuses. Bilateral lens implants are in place. The globes and orbits are otherwise unremarkable. Other: There are bilateral mastoid effusions, new on the left since 07/11/2018 the imaged nasopharynx is unremarkable. IMPRESSION: 1. Postsurgical changes reflecting right posterior fossa mass resection with decreased but not entirely resolved enhancement along the medial margin of the resection cavity. FLAIR signal abnormality along the posterior resection margin has slightly increased since the prior study. Residual tumor is not excluded. 2. The additional foci of parenchymal and leptomeningeal enhancement seen on the prior study from 07/11/2021 have resolved. No new lesions are identified. 3. Progressive confluent FLAIR signal abnormality in the supratentorial white matter likely reflects a combination of background chronic white matter microangiopathy and evolving postradiation change. Electronically Signed   By: PValetta MoleM.D.   On: 11/20/2021 19:43    Impression/Plan: 1. 76y.o. 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 She has recovered well from the effects of her recent whole brain radiation and is currently without complaints.  She continues to tolerate the current systemic therapy with single agent doxorubicin well and recent restaging imaging from 09/27/21 shows an excellent response to treatment so the current  plan is to continue with this regimen and repeat systemic imaging in 12/2021, after a few more cycles of chemotherapy.  Her recent post-treatment MRI brain scan shows an excellent response to treatment and no evidence of disease progression so we will plan to proceed with serial MRI brain scans every 3 months to continue to monitor for any evidence of disease progression or recurrence.  She appears to have a good understanding of these recommendations and is  comfortable and in agreement with the stated plan.  She knows that she is welcome to call at anytime in interim with any questions or concerns related to her previous radiation.  I personally spent 20 minutes in this encounter including chart review, reviewing radiological studies, telephone conversation with the patient, entering orders and completing documentation.    Nicholos Johns, PA-C

## 2021-11-22 ENCOUNTER — Telehealth: Payer: Self-pay

## 2021-11-22 ENCOUNTER — Other Ambulatory Visit (HOSPITAL_COMMUNITY): Payer: Self-pay

## 2021-11-22 ENCOUNTER — Other Ambulatory Visit: Payer: Self-pay | Admitting: Hematology and Oncology

## 2021-11-22 NOTE — Telephone Encounter (Signed)
Called and given below message. She verbalized understanding and said to decline MMW. She does not want it without the lidocaine. She will try the baking soda rinse and call the office back if she has concerns.

## 2021-11-22 NOTE — Telephone Encounter (Signed)
-----   Message from [name], MD sent at 11/22/2021 10:13 AM EDT ----- I received a request from her to refill MMW It would not have lidocaine due to back order Has she tried baking soda rinse? Not sure if it would be helpful for her to get it without lidocaiine

## 2021-11-23 ENCOUNTER — Other Ambulatory Visit (HOSPITAL_COMMUNITY): Payer: Self-pay

## 2021-11-26 ENCOUNTER — Inpatient Hospital Stay: Payer: Medicare Other | Attending: Hematology and Oncology

## 2021-11-26 DIAGNOSIS — D539 Nutritional anemia, unspecified: Secondary | ICD-10-CM | POA: Insufficient documentation

## 2021-11-26 DIAGNOSIS — F419 Anxiety disorder, unspecified: Secondary | ICD-10-CM | POA: Insufficient documentation

## 2021-11-26 DIAGNOSIS — Z79899 Other long term (current) drug therapy: Secondary | ICD-10-CM | POA: Insufficient documentation

## 2021-11-26 DIAGNOSIS — N183 Chronic kidney disease, stage 3 unspecified: Secondary | ICD-10-CM | POA: Insufficient documentation

## 2021-11-26 DIAGNOSIS — Z5111 Encounter for antineoplastic chemotherapy: Secondary | ICD-10-CM | POA: Insufficient documentation

## 2021-11-26 DIAGNOSIS — C55 Malignant neoplasm of uterus, part unspecified: Secondary | ICD-10-CM | POA: Insufficient documentation

## 2021-11-26 DIAGNOSIS — C7931 Secondary malignant neoplasm of brain: Secondary | ICD-10-CM | POA: Insufficient documentation

## 2021-11-26 DIAGNOSIS — K449 Diaphragmatic hernia without obstruction or gangrene: Secondary | ICD-10-CM | POA: Insufficient documentation

## 2021-11-26 DIAGNOSIS — I129 Hypertensive chronic kidney disease with stage 1 through stage 4 chronic kidney disease, or unspecified chronic kidney disease: Secondary | ICD-10-CM | POA: Insufficient documentation

## 2021-11-26 DIAGNOSIS — I700 Atherosclerosis of aorta: Secondary | ICD-10-CM | POA: Insufficient documentation

## 2021-11-29 MED FILL — Dexamethasone Sodium Phosphate Inj 100 MG/10ML: INTRAMUSCULAR | Qty: 1 | Status: AC

## 2021-11-30 ENCOUNTER — Inpatient Hospital Stay (HOSPITAL_BASED_OUTPATIENT_CLINIC_OR_DEPARTMENT_OTHER): Payer: Medicare Other | Admitting: Hematology and Oncology

## 2021-11-30 ENCOUNTER — Inpatient Hospital Stay: Payer: Medicare Other

## 2021-11-30 ENCOUNTER — Encounter: Payer: Self-pay | Admitting: Hematology and Oncology

## 2021-11-30 ENCOUNTER — Other Ambulatory Visit: Payer: Self-pay

## 2021-11-30 ENCOUNTER — Other Ambulatory Visit: Payer: Self-pay | Admitting: Hematology and Oncology

## 2021-11-30 VITALS — BP 173/87 | HR 73 | Resp 18 | Ht 59.0 in | Wt 110.8 lb

## 2021-11-30 VITALS — BP 178/88 | HR 67 | Temp 97.8°F | Resp 17

## 2021-11-30 DIAGNOSIS — I129 Hypertensive chronic kidney disease with stage 1 through stage 4 chronic kidney disease, or unspecified chronic kidney disease: Secondary | ICD-10-CM | POA: Diagnosis not present

## 2021-11-30 DIAGNOSIS — I700 Atherosclerosis of aorta: Secondary | ICD-10-CM | POA: Diagnosis not present

## 2021-11-30 DIAGNOSIS — N183 Chronic kidney disease, stage 3 unspecified: Secondary | ICD-10-CM | POA: Diagnosis not present

## 2021-11-30 DIAGNOSIS — C549 Malignant neoplasm of corpus uteri, unspecified: Secondary | ICD-10-CM

## 2021-11-30 DIAGNOSIS — K449 Diaphragmatic hernia without obstruction or gangrene: Secondary | ICD-10-CM | POA: Diagnosis not present

## 2021-11-30 DIAGNOSIS — D539 Nutritional anemia, unspecified: Secondary | ICD-10-CM | POA: Diagnosis not present

## 2021-11-30 DIAGNOSIS — C7931 Secondary malignant neoplasm of brain: Secondary | ICD-10-CM

## 2021-11-30 DIAGNOSIS — C55 Malignant neoplasm of uterus, part unspecified: Secondary | ICD-10-CM | POA: Diagnosis present

## 2021-11-30 DIAGNOSIS — Z79899 Other long term (current) drug therapy: Secondary | ICD-10-CM | POA: Diagnosis not present

## 2021-11-30 DIAGNOSIS — F419 Anxiety disorder, unspecified: Secondary | ICD-10-CM | POA: Diagnosis not present

## 2021-11-30 DIAGNOSIS — I10 Essential (primary) hypertension: Secondary | ICD-10-CM | POA: Diagnosis not present

## 2021-11-30 DIAGNOSIS — Z5111 Encounter for antineoplastic chemotherapy: Secondary | ICD-10-CM | POA: Diagnosis not present

## 2021-11-30 LAB — CMP (CANCER CENTER ONLY)
ALT: 10 U/L (ref 0–44)
AST: 13 U/L — ABNORMAL LOW (ref 15–41)
Albumin: 3.6 g/dL (ref 3.5–5.0)
Alkaline Phosphatase: 100 U/L (ref 38–126)
Anion gap: 6 (ref 5–15)
BUN: 25 mg/dL — ABNORMAL HIGH (ref 8–23)
CO2: 25 mmol/L (ref 22–32)
Calcium: 9.4 mg/dL (ref 8.9–10.3)
Chloride: 104 mmol/L (ref 98–111)
Creatinine: 1.24 mg/dL — ABNORMAL HIGH (ref 0.44–1.00)
GFR, Estimated: 45 mL/min — ABNORMAL LOW (ref >60)
Glucose, Bld: 119 mg/dL — ABNORMAL HIGH (ref 70–99)
Potassium: 3.6 mmol/L (ref 3.5–5.1)
Sodium: 135 mmol/L (ref 135–145)
Total Bilirubin: 0.3 mg/dL (ref 0.3–1.2)
Total Protein: 6.4 g/dL — ABNORMAL LOW (ref 6.5–8.1)

## 2021-11-30 LAB — CBC WITH DIFFERENTIAL (CANCER CENTER ONLY)
Abs Immature Granulocytes: 0.35 10*3/uL — ABNORMAL HIGH (ref 0.00–0.07)
Basophils Absolute: 0.1 10*3/uL (ref 0.0–0.1)
Basophils Relative: 1 %
Eosinophils Absolute: 0.1 10*3/uL (ref 0.0–0.5)
Eosinophils Relative: 1 %
HCT: 23.1 % — ABNORMAL LOW (ref 36.0–46.0)
Hemoglobin: 7.6 g/dL — ABNORMAL LOW (ref 12.0–15.0)
Immature Granulocytes: 6 %
Lymphocytes Relative: 10 %
Lymphs Abs: 0.6 10*3/uL — ABNORMAL LOW (ref 0.7–4.0)
MCH: 23.4 pg — ABNORMAL LOW (ref 26.0–34.0)
MCHC: 32.9 g/dL (ref 30.0–36.0)
MCV: 71.1 fL — ABNORMAL LOW (ref 80.0–100.0)
Monocytes Absolute: 0.8 10*3/uL (ref 0.1–1.0)
Monocytes Relative: 13 %
Neutro Abs: 4.4 10*3/uL (ref 1.7–7.7)
Neutrophils Relative %: 69 %
Platelet Count: 338 10*3/uL (ref 150–400)
RBC: 3.25 MIL/uL — ABNORMAL LOW (ref 3.87–5.11)
RDW: 16.1 % — ABNORMAL HIGH (ref 11.5–15.5)
Smear Review: NORMAL
WBC Count: 6.3 10*3/uL (ref 4.0–10.5)
nRBC: 0 % (ref 0.0–0.2)

## 2021-11-30 LAB — PREPARE RBC (CROSSMATCH)

## 2021-11-30 MED ORDER — DEXTROSE 5 % IV SOLN: Freq: Once | INTRAVENOUS | Status: AC

## 2021-11-30 MED ORDER — ACETAMINOPHEN 325 MG PO TABS
650.0000 mg | ORAL_TABLET | Freq: Once | ORAL | Status: AC
  Administered 2021-11-30: 650 mg via ORAL
  Filled 2021-11-30: qty 2

## 2021-11-30 MED ORDER — HEPARIN SOD (PORK) LOCK FLUSH 100 UNIT/ML IV SOLN
500.0000 [IU] | Freq: Once | INTRAVENOUS | Status: AC | PRN
  Administered 2021-11-30: 500 [IU]

## 2021-11-30 MED ORDER — SODIUM CHLORIDE 0.9% IV SOLUTION
250.0000 mL | Freq: Once | INTRAVENOUS | Status: AC
  Administered 2021-11-30: 250 mL via INTRAVENOUS

## 2021-11-30 MED ORDER — SODIUM CHLORIDE 0.9% FLUSH
10.0000 mL | Freq: Once | INTRAVENOUS | Status: DC
  Administered 2021-11-30: 10 mL

## 2021-11-30 MED ORDER — SODIUM CHLORIDE 0.9 % IV SOLN
10.0000 mg | Freq: Once | INTRAVENOUS | Status: AC
  Administered 2021-11-30: 10 mg via INTRAVENOUS
  Filled 2021-11-30: qty 10

## 2021-11-30 MED ORDER — SODIUM CHLORIDE 0.9% FLUSH
10.0000 mL | INTRAVENOUS | Status: DC | PRN
  Administered 2021-11-30: 10 mL

## 2021-11-30 MED ORDER — DIPHENHYDRAMINE HCL 25 MG PO CAPS
25.0000 mg | ORAL_CAPSULE | Freq: Once | ORAL | Status: AC
  Administered 2021-11-30: 25 mg via ORAL
  Filled 2021-11-30: qty 1

## 2021-11-30 MED ORDER — DOXORUBICIN HCL LIPOSOMAL CHEMO INJECTION 2 MG/ML
42.0000 mg/m2 | Freq: Once | INTRAVENOUS | Status: AC
  Administered 2021-11-30: 60 mg via INTRAVENOUS
  Filled 2021-11-30: qty 30

## 2021-11-30 NOTE — Patient Instructions (Addendum)
[facility]  Discharge Instructions: Thank you for choosing [facility] to provide your oncology and hematology care.   If you have a lab appointment with the [facility], please go directly to the [facility] and check in at the registration area.   Wear comfortable clothing and clothing appropriate for easy access to any Portacath or PICC line.   We strive to give you quality time with your provider. You may need to reschedule your appointment if you arrive late (15 or more minutes).  Arriving late affects you and other patients whose appointments are after yours.  Also, if you miss three or more appointments without notifying the office, you may be dismissed from the clinic at the provider's discretion.      For prescription refill requests, have your pharmacy contact our office and allow 72 hours for refills to be completed.    Today you received the following chemotherapy and/or immunotherapy agents: Doxil      To help prevent nausea and vomiting after your treatment, we encourage you to take your nausea medication as directed.  BELOW ARE SYMPTOMS THAT SHOULD BE REPORTED IMMEDIATELY: *FEVER GREATER THAN 100.4 F (38 C) OR HIGHER *CHILLS OR SWEATING *NAUSEA AND VOMITING THAT IS NOT CONTROLLED WITH YOUR NAUSEA MEDICATION *UNUSUAL SHORTNESS OF BREATH *UNUSUAL BRUISING OR BLEEDING *URINARY PROBLEMS (pain or burning when urinating, or frequent urination) *BOWEL PROBLEMS (unusual diarrhea, constipation, pain near the anus) TENDERNESS IN MOUTH AND THROAT WITH OR WITHOUT PRESENCE OF ULCERS (sore throat, sores in mouth, or a toothache) UNUSUAL RASH, SWELLING OR PAIN  UNUSUAL VAGINAL DISCHARGE OR ITCHING   Items with * indicate a potential emergency and should be followed up as soon as possible or go to the Emergency Department if any problems should occur.  Please show the CHEMOTHERAPY ALERT CARD or IMMUNOTHERAPY ALERT CARD at check-in to the  Emergency Department and triage nurse.  Should you have questions after your visit or need to cancel or reschedule your appointment, please contact [facility]  Dept: [phone]  and follow the prompts.  Office hours are 8:00 a.m. to 4:30 p.m. Monday - Friday. Please note that voicemails left after 4:00 p.m. may not be returned until the following business day.  We are closed weekends and major holidays. You have access to a nurse at all times for urgent questions. Please call the main number to the clinic Dept: [phone] and follow the prompts.   For any non-urgent questions, you may also contact your provider using MyChart. We now offer e-Visits for anyone 62 and older to request care online for non-urgent symptoms. For details visit [email or web address].   Also download the MyChart app! Go to the app store, search "MyChart", open the app, select [facility], and log in with your MyChart username and password.  Due to Covid, a mask is required upon entering the hospital/clinic. If you do not have a mask, one will be given to you upon arrival. For doctor visits, patients may have 1 support person aged 32 or older with them. For treatment visits, patients cannot have anyone with them due to current Covid guidelines and our immunocompromised population.   Blood Transfusion, Adult, Care After This sheet gives you information about how to care for yourself after your procedure. Your health care provider may also give you more specific instructions. If you have problems or questions, contact your health care provider. What can I expect after the procedure? After  the procedure, it is common to have: Bruising and soreness where the IV was inserted. A headache. Follow these instructions at home: IV insertion site care     Follow instructions from your health care provider about how to take care of your IV insertion site. Make sure you: Wash your hands with soap  and water before and after you change your bandage (dressing). If soap and water are not available, use hand sanitizer. Change your dressing as told by your health care provider. Check your IV insertion site every day for signs of infection. Check for: Redness, swelling, or pain. Bleeding from the site. Warmth. Pus or a bad smell. General instructions Take over-the-counter and prescription medicines only as told by your health care provider. Rest as told by your health care provider. Return to your normal activities as told by your health care provider. Keep all follow-up visits as told by your health care provider. This is important. Contact a health care provider if: You have itching or red, swollen areas of skin (hives). You feel anxious. You feel weak after doing your normal activities. You have redness, swelling, warmth, or pain around the IV insertion site. You have blood coming from the IV insertion site that does not stop with pressure. You have pus or a bad smell coming from your IV insertion site. Get help right away if: You have symptoms of a serious allergic or immune system reaction, including: Trouble breathing or shortness of breath. Swelling of the face or feeling flushed. Fever or chills. Pain in the head, back, or chest. Dark urine or blood in the urine. Widespread rash. Fast heartbeat. Feeling dizzy or light-headed. If you receive your blood transfusion in an outpatient setting, you will be told whom to contact to report any reactions. These symptoms may represent a serious problem that is an emergency. Do not wait to see if the symptoms will go away. Get medical help right away. Call your local emergency services (911 in the [place] Do not drive yourself to the hospital. Summary Bruising and tenderness around the IV insertion site are common. Check your IV insertion site every day for signs of infection. Rest as told by your health care provider. Return to your  normal activities as told by your health care provider. Get help right away for symptoms of a serious allergic or immune system reaction to blood transfusion. This information is not intended to replace advice given to you by your health care provider. Make sure you discuss any questions you have with your health care provider. Document Revised: 08/31/2020 Document Reviewed: 10/29/2018 Elsevier Patient Education  [place].

## 2021-11-30 NOTE — Assessment & Plan Note (Addendum)
She has multifactorial anemia, combination of thalassemia and recent anemia chronic illness We discussed some of the risks, benefits, and alternatives of blood transfusions. The patient is symptomatic from anemia and the hemoglobin level is critically low.  Some of the side-effects to be expected including risks of transfusion reactions, chills, infection, syndrome of volume overload and risk of hospitalization from various reasons and the patient is willing to proceed and went ahead to sign consent today. We will proceed with treatment without delay

## 2021-11-30 NOTE — Assessment & Plan Note (Signed)
Recent MRI of the brain shows subtle changes raising question whether she have leptomeningeal disease but she is not symptomatic Observe only She will continue MRI surveillance with radiation oncologist

## 2021-11-30 NOTE — Assessment & Plan Note (Signed)
She has slight intermittent elevated creatinine We discussed importance of adequate hydration

## 2021-11-30 NOTE — Progress Notes (Signed)
[place] OFFICE PROGRESS NOTE  Patient Care Team: [name], MD as PCP - General (Hematology and Oncology)  ASSESSMENT & PLAN:  Uterine cancer [facility]) Her last CT imaging showed excellent response to therapy She had very mild mucositis with recent treatment that resolved spontaneously I plan to continue systemic treatment for few more months with plan to repeat imaging study again in August ECHO shows slight changes in ejection fraction but overall still within normal range I will plan to repeat echo again in August  Deficiency anemia She has multifactorial anemia, combination of thalassemia and recent anemia chronic illness We discussed some of the risks, benefits, and alternatives of blood transfusions. The patient is symptomatic from anemia and the hemoglobin level is critically low.  Some of the side-effects to be expected including risks of transfusion reactions, chills, infection, syndrome of volume overload and risk of hospitalization from various reasons and the patient is willing to proceed and went ahead to sign consent today. We will proceed with treatment without delay  CKD (chronic kidney disease), stage III ([place]) She has slight intermittent elevated creatinine We discussed importance of adequate hydration  Essential hypertension Her blood pressure is profoundly elevated due to anxiety I will add hydralazine 25 mg daily in the morning  Malignant neoplasm metastatic to brain [facility]) Recent MRI of the brain shows subtle changes raising question whether she have leptomeningeal disease but she is not symptomatic Observe only She will continue MRI surveillance with radiation oncologist  Orders Placed This Encounter  Procedures   CT ABDOMEN PELVIS W CONTRAST    Standing Status:   Future    Standing Expiration Date:   12/01/2022    Order Specific Question:   If indicated for the ordered procedure, I authorize the administration of contrast media per Radiology  protocol    Answer:   Yes    Order Specific Question:   Preferred imaging location?    Answer:   [facility]    Order Specific Question:   Radiology Contrast Protocol - do NOT remove file path    Answer:   [email or web address]   Informed Consent Details: Physician/Practitioner Attestation; Transcribe to consent form and obtain patient signature    Standing Status:   Future    Standing Expiration Date:   12/01/2022    Order Specific Question:   Physician/Practitioner attestation of informed consent for blood and or blood product transfusion    Answer:   I, the physician/practitioner, attest that I have discussed with the patient the benefits, risks, side effects, alternatives, likelihood of achieving goals and potential problems during recovery for the procedure that I have provided informed consent.    Order Specific Question:   Product(s)    Answer:   All Product(s)   Care order/instruction    Transfuse Parameters    Standing Status:   Future    Number of Occurrences:   1    Standing Expiration Date:   11/30/2022   ECHOCARDIOGRAM COMPLETE    Standing Status:   Future    Standing Expiration Date:   12/01/2022    Order Specific Question:   Where should this test be performed    Answer:   [facility]    Order Specific Question:   Perflutren DEFINITY (image enhancing agent) should be administered unless hypersensitivity or allergy exist    Answer:   Administer Perflutren    Order Specific Question:   Reason for exam-Echo    Answer:   Chemo  Z09  Type and screen         Standing Status:   Future    Number of Occurrences:   1    Standing Expiration Date:   12/01/2022   Sample to Blood Bank    Standing Status:   Standing    Number of Occurrences:   33    Standing Expiration Date:   12/01/2022    All questions were answered. The patient knows to call the clinic with any problems, questions or concerns. The total time spent in the appointment  was 40 minutes encounter with patients including review of chart and various tests results, discussions about plan of care and coordination of care plan   [name], MD 11/30/2021 12:19 PM  INTERVAL HISTORY: Please see below for problem oriented charting. she returns for treatment follow-up with her family She is doing well except she appears somewhat anxious Her blood pressure is poorly controlled at home, majority of systolic blood pressure is at least 150 No recent falls She does feel somewhat tired and weak No recent bleeding No new neurological deficits  REVIEW OF SYSTEMS:   Constitutional: Denies fevers, chills or abnormal weight loss Eyes: Denies blurriness of vision Respiratory: Denies cough, dyspnea or wheezes Cardiovascular: Denies palpitation, chest discomfort or lower extremity swelling Gastrointestinal:  Denies nausea, heartburn or change in bowel habits Skin: Denies abnormal skin rashes Lymphatics: Denies new lymphadenopathy or easy bruising Neurological:Denies numbness, tingling or new weaknesses Behavioral/Psych: Mood is stable, no new changes  All other systems were reviewed with the patient and are negative.  I have reviewed the past medical history, past surgical history, social history and family history with the patient and they are unchanged from previous note.  ALLERGIES:  has No Known Allergies.  MEDICATIONS:  Current Outpatient Medications  Medication Sig Dispense Refill   hydrALAZINE (APRESOLINE) 25 MG tablet Take 25 mg by mouth daily.     atenolol (TENORMIN) 50 MG tablet Take 0.5 tablets (25 mg total) by mouth 2 (two) times daily. 90 tablet 0   cholecalciferol (VITAMIN D3) 25 MCG (1000 UNIT) tablet Take 1,000 Units by mouth daily.     lidocaine-prilocaine (EMLA) cream Apply 1 application topically daily as needed. 30 g 3   LORazepam (ATIVAN) 0.5 MG tablet TAKE 1 TABLET(0.5 MG) BY MOUTH TWICE DAILY AS NEEDED FOR ANXIETY (Patient taking differently: Take  0.5 mg by mouth 2 (two) times daily as needed for anxiety.) 60 tablet 0   losartan (COZAAR) 25 MG tablet Take 1 tablet (25 mg total) by mouth daily. 30 tablet 1   ondansetron (ZOFRAN) 8 MG tablet Take 1 tablet (8 mg total) by mouth every 8 (eight) hours as needed for nausea. 30 tablet 3   prochlorperazine (COMPAZINE) 10 MG tablet Take 1 tablet (10 mg total) by mouth every 6 (six) hours as needed for nausea or vomiting. 30 tablet 1   No current facility-administered medications for this visit.   Facility-Administered Medications Ordered in Other Visits  Medication Dose Route Frequency Provider Last Rate Last Admin   dexamethasone (DECADRON) 10 mg in sodium chloride 0.9 % 50 mL IVPB  10 mg Intravenous Once Alvy Bimler, Kaedon Fanelli, MD 204 mL/hr at 11/30/21 1209 10 mg at 11/30/21 1209   DOXOrubicin HCL LIPOSOMAL (DOXIL) 60 mg in dextrose 5 % 250 mL chemo infusion  42 mg/m2 (Treatment Plan Recorded) Intravenous Once Alvy Bimler, Virlan Kempker, MD       heparin lock flush 100 unit/mL  500 Units Intracatheter Once PRN [name], MD  sodium chloride flush (NS) 0.9 % injection 10 mL  10 mL Intracatheter PRN [name], MD        SUMMARY OF ONCOLOGIC HISTORY: Oncology History Overview Note  High grade serous MMR normal Her2 negative Progressed on carboplatin, taxol, pembrolizumab and lenvima   Uterine cancer ([place])  08/28/2020 Imaging   1. Pelvic mass measuring 7.5 x 4.8 cm, contiguous with the sigmoid colon and uterus with loss of fat planes. Site of origin is unclear, may be ovarian, uterine, or colonic. Lack of contrast limits detailed assessment. 2. Above pelvic mass causes partial obstruction of the left ureter with mild left hydroureteronephrosis. 3. Multifocal adenopathy in the abdomen and pelvis, suspicious for metastatic disease, primarily in the mesentery and lower peritoneum and pelvis. Please note that detailed assessment of adenopathy is limited on this noncontrast exam. Recommend oncology referral.  4.  Wall thickening of the sigmoid colon distal to the left pelvic mass with pericolonic edema, suspicious for colitis. There is a moderate to large amount of stool in the more proximal colon, suggesting pelvic mass may be causing delayed colonic transit. 5. Small to moderate hiatal hernia.   08/28/2020 - 09/04/2020 Hospital Admission   The patient has been complaining of reduced urine output, loss of appetite and changes in bowel habits She had abnormal imaging study leading to hospitalization, colonoscopy, biopsy and subsequently chemotherapy   08/30/2020 Procedure   CT-guided biopsy of enlarged left external iliac chain lymph node as above   08/30/2020 Pathology Results   SURGICAL PATHOLOGY  CASE: [id number]  PATIENT: Laaibah Griggs  Surgical Pathology Report  Clinical History: Pelvic mass with metastatic lymph adenopathy ([place])  FINAL MICROSCOPIC DIAGNOSIS:   A. LYMPH NODE, LEFT EXTERNAL ILIAC CHAIN, BIOPSY:  - Poorly differentiated carcinoma with necrosis.  - See comment.   COMMENT:  The carcinoma is characterized by diffuse sheets of malignant epithelioid cells with prominent nucleoli and frequent mitotic figures. Immunohistochemistry is positive with cytokeratin 7 and PAX 8 with weak  positivity with estrogen receptor and WT1.  P53 shows diffuse strong nuclear positivity.  The carcinoma is negative with cytokeratin 20, CDX2, GATA3, GCDFP, TTF-1, HepPar 1, arginase 1 and CD10.  The  morphology and immunophenotype are most consistent with a gynecologic primary including high-grade serous carcinoma.    08/31/2020 Procedure   Colonoscopy - Preparation of the colon was fair. - Likely malignant partially obstructing tumor in the sigmoid colon (appears extrinsic process). Biopsied. Tattooed. - Internal hemorrhoids. - Stool in the entire examined colon   09/01/2020 Initial Diagnosis   Uterine cancer ([place])   09/01/2020 Procedure   Successful placement of a power injectable Port-A-Cath via  the right internal jugular vein. The catheter is ready for immediate use.   09/04/2020 - 12/29/2020 Chemotherapy    Patient is on Treatment Plan: UTERINE CARBOPLATIN AUC 6 / PACLITAXEL Q21D       11/15/2020 Imaging   1. Marked interval response to therapy with decrease in size of the mass extending from the uterus to the colon that was seen on the previous examination. Also with decreased size and or resolution of pelvic, mesenteric and retroperitoneal adenopathy seen on previous imaging. 2. Tethering of the colon with area that raise the question of developing colovaginal fistula versus internal enhancement within the mass that is now decreased in size. Attention on follow-up is suggested. Correlate with any current signs of vaginal drainage. 3. LEFT thoracic inlet adenopathy compatible with involvement in the chest. Area not imaged on previous imaging.  02/18/2021 Imaging   1. Interval progression of left supraclavicular,, left thoracic inlet, left subpectoral, retroperitoneal, mesenteric and pelvic sidewall lymphadenopathy. Metastatic deposits in the central pelvis are also progressive. 2. Interval progression of left subpectoral lymphadenopathy with thrombus now visible in the left subclavian/innominate vein. 3. Aortic Atherosclerosis (ICD10-I70.0).   02/23/2021 - 06/15/2021 Chemotherapy   Patient is on Treatment Plan : UTERINE Lenvatinib + Pembrolizumab q21d     05/23/2021 Imaging   1. Decreased size of pelvic soft tissue lesions. 2. Decreased size of left supraclavicular and left subpectoral lymph nodes. 3. Overall decreased size of abdominal and pelvic lymph nodes; however, a few left-sided pelvic and inguinal lymph nodes have increased in size. 4. New mild compression deformity of the inferior endplate of L3. 5.  Aortic Atherosclerosis (ICD10-I70.0).     06/01/2021 Surgery   Preoperative diagnosis: Metastatic uterine cancer to the brain with large right cerebellar met Postoperative  diagnosis: Same Procedure: Right suboccipital craniectomy and resection of metastatic lesion to the lateral cerebellum.  Operating microscope.,  Microdissection technique. Surgeon: Kristeen Miss First Assistant: Consuella Lose MD    06/01/2021 Pathology Results   SURGICAL PATHOLOGY  CASE: [id number]  PATIENT: Genise Raya  Surgical Pathology Report    FINAL MICROSCOPIC DIAGNOSIS:   A. BRAIN TUMOR, RIGHT CEREBELLAR, RESECTION:  Poorly differentiated carcinoma with morphology and immunoprofile consistent with metastatic previously diagnosed high-grade serous carcinoma.  Clinical correlation is required.   COMMENT:   Immunohistochemistry with appropriate controls for single antibody is positive for CK7, PAX8, WT1 and p53 and is negative for GFAP, CK20 and ER.    07/05/2021 Cancer Staging   Staging form: Corpus Uteri - Carcinoma and Carcinosarcoma, AJCC 8th Edition - Clinical stage from 07/05/2021: FIGO Stage IVB (rcT4, cN2a, pM1) - Signed by [name], MD on 07/05/2021 Stage prefix: Recurrence   07/05/2021 Imaging   1. Today's study demonstrates definitive progression of disease, as evidenced by enlarging mass associated with the posterior aspect of the uterine fundus, which is intimately associated with loops of small bowel and sigmoid colon, worsening pelvic lymphadenopathy, possible adrenal metastasis, and multifocal osseous metastases, as detailed above. 2. Additional incidental findings, as above.       07/11/2021 Imaging   MRI brain 1. Postsurgical changes from right posterior fossa tumor resection with new focus of contrast enhancement at the right cerebellar hemisphere surgical cavity margins, concerning for recurrence. 2. 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 3. A 5 mm new lesion in the right cerebellar hemisphere. 4. Decrease in size of the previously described left frontal and parietal lesions with  resolution of punctate contrast enhancement in the left cerebellar hemisphere.   07/12/2021 Echocardiogram    1. Left ventricular ejection fraction, by estimation, is 55 to 60%. The left ventricle has normal function. The left ventricle has no regional wall motion abnormalities. Left ventricular diastolic parameters are indeterminate. The average left ventricular global longitudinal strain is -17.3 %. The global longitudinal strain is normal.  2. Right ventricular systolic function is normal. The right ventricular size is normal. There is normal pulmonary artery systolic pressure.  3. Left atrial size was mild to moderately dilated.  4. Right atrial size was mildly dilated.  5. The mitral valve is grossly normal. Trivial mitral valve regurgitation. No evidence of mitral stenosis. Moderate mitral annular calcification.  6. The aortic valve is grossly normal. There is moderate calcification of the aortic valve. There is moderate thickening of the aortic valve.  Aortic valve regurgitation is not visualized. Mild aortic valve stenosis.  7. The inferior vena cava is normal in size with greater than 50% respiratory variability, suggesting right atrial pressure of 3 mmHg.     07/16/2021 -  Chemotherapy   Patient is on Treatment Plan : Uterine Liposomal Doxorubicin q28d     09/27/2021 Imaging   1. Interval decrease in size in bilateral iliac and pelvic sidewall lymph nodes. 2. Tethered appearing soft tissue mass associated with the posterior uterine fundus is slightly diminished in size. 3. Interval decrease in size of a nodule of the body and medial limb of the right adrenal gland. 4. Constellation of findings is consistent with treatment response. 5. Multiple unchanged small sclerotic osseous lesions. 6. Trace bilateral pleural effusions, unchanged   10/05/2021 Echocardiogram    1. Compared to 07/12/21, LV function is slightly worse and GLS has declined.  2. Left ventricular ejection fraction, by  estimation, is 50 to 55%. The left ventricle has low normal function. The left ventricle has no regional wall motion abnormalities. Left ventricular diastolic parameters are consistent with Grade I diastolic dysfunction (impaired relaxation). Elevated left atrial pressure. The average left ventricular global longitudinal strain is -7.9 %. The global longitudinal strain is abnormal.  3. Right ventricular systolic function is normal. The right ventricular size is normal.  4. Left atrial size was mildly dilated.  5. The mitral valve is normal in structure. Trivial mitral valve regurgitation. No evidence of mitral stenosis.  6. The aortic valve is tricuspid. Aortic valve regurgitation is not visualized. Aortic valve sclerosis is present, with no evidence of aortic valve stenosis.  7. The inferior vena cava is normal in size with greater than 50% respiratory variability, suggesting right atrial pressure of 3 mmHg.     PHYSICAL EXAMINATION: ECOG PERFORMANCE STATUS: 1 - Symptomatic but completely ambulatory  Vitals:   11/30/21 1040  BP: (!) 173/87  Pulse: 73  Resp: 18  SpO2: 100%   Filed Weights   11/30/21 1040  Weight: 110 lb 12.8 oz (50.3 kg)    GENERAL:alert, no distress and comfortable  NEURO: alert & oriented x 3 with fluent speech, no focal motor/sensory deficits  LABORATORY DATA:  I have reviewed the data as listed    Component Value Date/Time   NA 135 11/30/2021 1015   K 3.6 11/30/2021 1015   CL 104 11/30/2021 1015   CO2 25 11/30/2021 1015   GLUCOSE 119 (H) 11/30/2021 1015   BUN 25 (H) 11/30/2021 1015   CREATININE 1.24 (H) 11/30/2021 1015   CALCIUM 9.4 11/30/2021 1015   PROT 6.4 (L) 11/30/2021 1015   ALBUMIN 3.6 11/30/2021 1015   AST 13 (L) 11/30/2021 1015   ALT 10 11/30/2021 1015   ALKPHOS 100 11/30/2021 1015   BILITOT 0.3 11/30/2021 1015   GFRNONAA 45 (L) 11/30/2021 1015    No results found for: "SPEP", "UPEP"  Lab Results  Component Value Date   WBC 6.3  11/30/2021   NEUTROABS 4.4 11/30/2021   HGB 7.6 (L) 11/30/2021   HCT 23.1 (L) 11/30/2021   MCV 71.1 (L) 11/30/2021   PLT 338 11/30/2021      Chemistry      Component Value Date/Time   NA 135 11/30/2021 1015   K 3.6 11/30/2021 1015   CL 104 11/30/2021 1015   CO2 25 11/30/2021 1015   BUN 25 (H) 11/30/2021 1015   CREATININE 1.24 (H) 11/30/2021 1015      Component Value Date/Time   CALCIUM  9.4 11/30/2021 1015   ALKPHOS 100 11/30/2021 1015   AST 13 (L) 11/30/2021 1015   ALT 10 11/30/2021 1015   BILITOT 0.3 11/30/2021 1015       RADIOGRAPHIC STUDIES: I have personally reviewed the radiological images as listed and agreed with the findings in the report. MR Brain W Wo Contrast  Addendum Date: 11/29/2021   ADDENDUM REPORT: 11/29/2021 10:32 ADDENDUM: On further review, the following findings are noted: There is new enhancement in the left occipital lobe suspicious for leptomeningeal disease (11-72). A punctate lesion in the left temporal lobe is unchanged (11-62). A 3 mm enhancing lesion in the left parietal lobe is decreased in size (11-129). Electronically Signed   By: Valetta Mole M.D.   On: 11/29/2021 10:32   Result Date: 11/29/2021 CLINICAL DATA:  Brain metastasis, assess treatment response after whole-brain radiation completed 08/14/2018 EXAM: MRI HEAD WITHOUT AND WITH CONTRAST TECHNIQUE: Multiplanar, multiecho pulse sequences of the brain and surrounding structures were obtained without and with intravenous contrast. CONTRAST:  43mL MULTIHANCE GADOBENATE DIMEGLUMINE 529 MG/ML IV SOLN COMPARISON:  Brain MRI 07/11/2018 FINDINGS: Brain: Postsurgical changes reflecting right posterior fossa mass resection are again seen. A small amount of chronic blood products are noted at the periphery of the resection cavity. The cavity is similar in size to the prior study from 07/16/2021. Previously seen nodular enhancement along the medial aspect of the resection cavity has decreased in size but has  not entirely resolved, with persistent linear enhancement (11-38). FLAIR signal abnormality posterior to the resection margin has slightly increased (8-20). There is no significant mass effect Previously seen foci of enhancement in the left frontal and bilateral parietal lobes and inferior right cerebellar hemisphere are no longer seen. There are no new lesions The ventricles are stable in size. There is progressive confluent FLAIR signal abnormality in both cerebral hemispheres likely reflecting a combination of background chronic white matter microangiopathy and evolving postradiation change. The ventricles are stable in size. Remote infarcts in the bilateral cerebellar hemispheres are unchanged. There is no evidence of acute infarct intracranial hemorrhage, or extra-axial fluid collection. Vascular: Normal flow voids. Skull and upper cervical spine: Postsurgical changes as above. There is no suspicious marrow signal abnormality. Sinuses/Orbits: There is mild mucosal thickening in the paranasal sinuses. Bilateral lens implants are in place. The globes and orbits are otherwise unremarkable. Other: There are bilateral mastoid effusions, new on the left since 07/11/2018 the imaged nasopharynx is unremarkable. IMPRESSION: 1. Postsurgical changes reflecting right posterior fossa mass resection with decreased but not entirely resolved enhancement along the medial margin of the resection cavity. FLAIR signal abnormality along the posterior resection margin has slightly increased since the prior study. Residual tumor is not excluded. 2. The additional foci of parenchymal and leptomeningeal enhancement seen on the prior study from 07/11/2021 have resolved. No new lesions are identified. 3. Progressive confluent FLAIR signal abnormality in the supratentorial white matter likely reflects a combination of background chronic white matter microangiopathy and evolving postradiation change. Electronically Signed: By: Valetta Mole  M.D. On: 11/20/2021 19:43

## 2021-11-30 NOTE — Assessment & Plan Note (Signed)
Her blood pressure is profoundly elevated due to anxiety I will add hydralazine 25 mg daily in the morning

## 2021-11-30 NOTE — Assessment & Plan Note (Signed)
Her last CT imaging showed excellent response to therapy She had very mild mucositis with recent treatment that resolved spontaneously I plan to continue systemic treatment for few more months with plan to repeat imaging study again in August ECHO shows slight changes in ejection fraction but overall still within normal range I will plan to repeat echo again in August

## 2021-12-01 ENCOUNTER — Other Ambulatory Visit: Payer: Self-pay | Admitting: Hematology and Oncology

## 2021-12-01 LAB — TYPE AND SCREEN
ABO/RH(D): A POS
Antibody Screen: NEGATIVE
Unit division: 0

## 2021-12-01 LAB — BPAM RBC
Blood Product Expiration Date: 202307302359
ISSUE DATE / TIME: 202307141419
Unit Type and Rh: 6200

## 2021-12-03 ENCOUNTER — Encounter: Payer: Self-pay | Admitting: Hematology and Oncology

## 2021-12-10 ENCOUNTER — Other Ambulatory Visit: Payer: Self-pay

## 2021-12-10 ENCOUNTER — Other Ambulatory Visit (HOSPITAL_COMMUNITY): Payer: Medicare Other

## 2021-12-24 ENCOUNTER — Telehealth: Payer: Self-pay

## 2021-12-24 ENCOUNTER — Other Ambulatory Visit (HOSPITAL_COMMUNITY): Payer: Self-pay

## 2021-12-24 ENCOUNTER — Other Ambulatory Visit: Payer: Self-pay | Admitting: Hematology and Oncology

## 2021-12-24 ENCOUNTER — Ambulatory Visit (HOSPITAL_COMMUNITY)
Admission: RE | Admit: 2021-12-24 | Discharge: 2021-12-24 | Disposition: A | Discharge status: Home / Self Care | Payer: Medicare Other | Source: Ambulatory Visit | Attending: Hematology and Oncology | Admitting: Hematology and Oncology

## 2021-12-24 ENCOUNTER — Other Ambulatory Visit: Payer: Self-pay

## 2021-12-24 DIAGNOSIS — C549 Malignant neoplasm of corpus uteri, unspecified: Secondary | ICD-10-CM

## 2021-12-24 DIAGNOSIS — C7989 Secondary malignant neoplasm of other specified sites: Secondary | ICD-10-CM | POA: Diagnosis not present

## 2021-12-24 DIAGNOSIS — C55 Malignant neoplasm of uterus, part unspecified: Secondary | ICD-10-CM | POA: Diagnosis present

## 2021-12-24 DIAGNOSIS — I10 Essential (primary) hypertension: Secondary | ICD-10-CM

## 2021-12-24 DIAGNOSIS — C786 Secondary malignant neoplasm of retroperitoneum and peritoneum: Secondary | ICD-10-CM | POA: Insufficient documentation

## 2021-12-24 DIAGNOSIS — Z9221 Personal history of antineoplastic chemotherapy: Secondary | ICD-10-CM | POA: Insufficient documentation

## 2021-12-24 DIAGNOSIS — Z0189 Encounter for other specified special examinations: Secondary | ICD-10-CM | POA: Diagnosis not present

## 2021-12-24 DIAGNOSIS — C7931 Secondary malignant neoplasm of brain: Secondary | ICD-10-CM | POA: Insufficient documentation

## 2021-12-24 DIAGNOSIS — D539 Nutritional anemia, unspecified: Secondary | ICD-10-CM | POA: Insufficient documentation

## 2021-12-24 DIAGNOSIS — C7971 Secondary malignant neoplasm of right adrenal gland: Secondary | ICD-10-CM | POA: Diagnosis not present

## 2021-12-24 DIAGNOSIS — I429 Cardiomyopathy, unspecified: Secondary | ICD-10-CM

## 2021-12-24 DIAGNOSIS — J90 Pleural effusion, not elsewhere classified: Secondary | ICD-10-CM | POA: Diagnosis not present

## 2021-12-24 DIAGNOSIS — Z5111 Encounter for antineoplastic chemotherapy: Secondary | ICD-10-CM

## 2021-12-24 DIAGNOSIS — K449 Diaphragmatic hernia without obstruction or gangrene: Secondary | ICD-10-CM | POA: Insufficient documentation

## 2021-12-24 LAB — ECHOCARDIOGRAM COMPLETE
Area-P 1/2: 4.18 cm2
Calc EF: 40.1 %
MV VTI: 1.85 cm2
S' Lateral: 3 cm
Single Plane A2C EF: 37.9 %
Single Plane A4C EF: 42.4 %

## 2021-12-24 MED ORDER — MAGIC MOUTHWASH W/LIDOCAINE: 5.0000 mL | Freq: Four times a day (QID) | ORAL | 0 refills | Status: AC

## 2021-12-24 NOTE — Progress Notes (Signed)
  Echocardiogram 2D Echocardiogram has been performed.  Teresa Oliver 12/24/2021, 11:22 AM

## 2021-12-24 NOTE — Telephone Encounter (Signed)
-----   Message from [name], MD sent at 12/24/2021 12:54 PM EDT ----- Regarding: cardiology consult Pls call her, echo result is slightly worse. I have to send consult to cardiology. We will keep her infusion appt for blood only

## 2021-12-24 NOTE — Telephone Encounter (Signed)
Called and given below message. She verbalized understanding and appreciated the call.  She is requesting MMW Rx to [facility] for mouth irritation. Pharmacy is expecting Lidocaine on Wednesday or Thursday. Called in Rx for MMW to [facility]. Teresa Oliver is aware and will call pharmacy later this week. She will continue to use baking soda rinse.

## 2021-12-25 ENCOUNTER — Ambulatory Visit (HOSPITAL_COMMUNITY)
Admission: RE | Admit: 2021-12-25 | Discharge: 2021-12-25 | Disposition: A | Discharge status: Home / Self Care | Payer: Medicare Other | Source: Ambulatory Visit | Attending: Hematology and Oncology | Admitting: Hematology and Oncology

## 2021-12-25 ENCOUNTER — Other Ambulatory Visit: Payer: Self-pay

## 2021-12-25 ENCOUNTER — Telehealth: Payer: Self-pay | Admitting: *Deleted

## 2021-12-25 ENCOUNTER — Inpatient Hospital Stay: Payer: Medicare Other | Attending: Hematology and Oncology

## 2021-12-25 DIAGNOSIS — K449 Diaphragmatic hernia without obstruction or gangrene: Secondary | ICD-10-CM | POA: Insufficient documentation

## 2021-12-25 DIAGNOSIS — D539 Nutritional anemia, unspecified: Secondary | ICD-10-CM

## 2021-12-25 DIAGNOSIS — C55 Malignant neoplasm of uterus, part unspecified: Secondary | ICD-10-CM | POA: Diagnosis present

## 2021-12-25 DIAGNOSIS — Z79899 Other long term (current) drug therapy: Secondary | ICD-10-CM | POA: Insufficient documentation

## 2021-12-25 DIAGNOSIS — C7971 Secondary malignant neoplasm of right adrenal gland: Secondary | ICD-10-CM | POA: Insufficient documentation

## 2021-12-25 DIAGNOSIS — C7931 Secondary malignant neoplasm of brain: Secondary | ICD-10-CM | POA: Diagnosis not present

## 2021-12-25 DIAGNOSIS — Z9221 Personal history of antineoplastic chemotherapy: Secondary | ICD-10-CM | POA: Diagnosis not present

## 2021-12-25 DIAGNOSIS — I10 Essential (primary) hypertension: Secondary | ICD-10-CM | POA: Insufficient documentation

## 2021-12-25 DIAGNOSIS — C7989 Secondary malignant neoplasm of other specified sites: Secondary | ICD-10-CM | POA: Diagnosis not present

## 2021-12-25 DIAGNOSIS — I081 Rheumatic disorders of both mitral and tricuspid valves: Secondary | ICD-10-CM | POA: Insufficient documentation

## 2021-12-25 DIAGNOSIS — C786 Secondary malignant neoplasm of retroperitoneum and peritoneum: Secondary | ICD-10-CM | POA: Diagnosis not present

## 2021-12-25 DIAGNOSIS — J90 Pleural effusion, not elsewhere classified: Secondary | ICD-10-CM | POA: Diagnosis not present

## 2021-12-25 DIAGNOSIS — C549 Malignant neoplasm of corpus uteri, unspecified: Secondary | ICD-10-CM

## 2021-12-25 DIAGNOSIS — Z95828 Presence of other vascular implants and grafts: Secondary | ICD-10-CM

## 2021-12-25 LAB — CBC WITH DIFFERENTIAL (CANCER CENTER ONLY)
Abs Immature Granulocytes: 0.31 10*3/uL — ABNORMAL HIGH (ref 0.00–0.07)
Basophils Absolute: 0.1 10*3/uL (ref 0.0–0.1)
Basophils Relative: 1 %
Eosinophils Absolute: 0.1 10*3/uL (ref 0.0–0.5)
Eosinophils Relative: 1 %
HCT: 25.3 % — ABNORMAL LOW (ref 36.0–46.0)
Hemoglobin: 8.3 g/dL — ABNORMAL LOW (ref 12.0–15.0)
Immature Granulocytes: 5 %
Lymphocytes Relative: 9 %
Lymphs Abs: 0.5 10*3/uL — ABNORMAL LOW (ref 0.7–4.0)
MCH: 24.1 pg — ABNORMAL LOW (ref 26.0–34.0)
MCHC: 32.8 g/dL (ref 30.0–36.0)
MCV: 73.3 fL — ABNORMAL LOW (ref 80.0–100.0)
Monocytes Absolute: 0.8 10*3/uL (ref 0.1–1.0)
Monocytes Relative: 14 %
Neutro Abs: 4.1 10*3/uL (ref 1.7–7.7)
Neutrophils Relative %: 70 %
Platelet Count: 316 10*3/uL (ref 150–400)
RBC: 3.45 MIL/uL — ABNORMAL LOW (ref 3.87–5.11)
RDW: 18.7 % — ABNORMAL HIGH (ref 11.5–15.5)
WBC Count: 5.9 10*3/uL (ref 4.0–10.5)
nRBC: 0 % (ref 0.0–0.2)

## 2021-12-25 LAB — CMP (CANCER CENTER ONLY)
ALT: 9 U/L (ref 0–44)
AST: 16 U/L (ref 15–41)
Albumin: 3.6 g/dL (ref 3.5–5.0)
Alkaline Phosphatase: 120 U/L (ref 38–126)
Anion gap: 8 (ref 5–15)
BUN: 24 mg/dL — ABNORMAL HIGH (ref 8–23)
CO2: 25 mmol/L (ref 22–32)
Calcium: 9.1 mg/dL (ref 8.9–10.3)
Chloride: 103 mmol/L (ref 98–111)
Creatinine: 1.29 mg/dL — ABNORMAL HIGH (ref 0.44–1.00)
GFR, Estimated: 43 mL/min — ABNORMAL LOW (ref >60)
Glucose, Bld: 109 mg/dL — ABNORMAL HIGH (ref 70–99)
Potassium: 4.1 mmol/L (ref 3.5–5.1)
Sodium: 136 mmol/L (ref 135–145)
Total Bilirubin: 0.4 mg/dL (ref 0.3–1.2)
Total Protein: 6.7 g/dL (ref 6.5–8.1)

## 2021-12-25 LAB — SAMPLE TO BLOOD BANK

## 2021-12-25 MED ORDER — SODIUM CHLORIDE 0.9% FLUSH
10.0000 mL | Freq: Once | INTRAVENOUS | Status: AC
  Administered 2021-12-25: 10 mL

## 2021-12-25 MED ORDER — HEPARIN SOD (PORK) LOCK FLUSH 100 UNIT/ML IV SOLN
INTRAVENOUS | Status: AC
  Filled 2021-12-25: qty 5

## 2021-12-25 MED ORDER — IOHEXOL 300 MG/ML  SOLN
80.0000 mL | Freq: Once | INTRAMUSCULAR | Status: AC | PRN
  Administered 2021-12-25: 75 mL via INTRAVENOUS

## 2021-12-25 MED ORDER — HEPARIN SOD (PORK) LOCK FLUSH 100 UNIT/ML IV SOLN
500.0000 [IU] | Freq: Once | INTRAVENOUS | Status: AC
  Administered 2021-12-25: 500 [IU] via INTRAVENOUS

## 2021-12-25 NOTE — Telephone Encounter (Addendum)
Infusion appt canceled. Patient informed and verbalized understanding  ----- Message from [name], MD sent at 12/25/2021 10:30 AM EDT ----- She will not need blood on Thursday, pls cancel her infusion appt

## 2021-12-27 ENCOUNTER — Telehealth: Payer: Self-pay

## 2021-12-27 ENCOUNTER — Inpatient Hospital Stay: Payer: Medicare Other

## 2021-12-27 ENCOUNTER — Other Ambulatory Visit: Payer: Self-pay

## 2021-12-27 ENCOUNTER — Other Ambulatory Visit (HOSPITAL_COMMUNITY): Payer: Self-pay

## 2021-12-27 ENCOUNTER — Encounter: Payer: Self-pay | Admitting: Hematology and Oncology

## 2021-12-27 ENCOUNTER — Inpatient Hospital Stay (HOSPITAL_BASED_OUTPATIENT_CLINIC_OR_DEPARTMENT_OTHER): Payer: Medicare Other | Admitting: Hematology and Oncology

## 2021-12-27 DIAGNOSIS — C549 Malignant neoplasm of corpus uteri, unspecified: Secondary | ICD-10-CM

## 2021-12-27 DIAGNOSIS — Z7189 Other specified counseling: Secondary | ICD-10-CM

## 2021-12-27 DIAGNOSIS — J90 Pleural effusion, not elsewhere classified: Secondary | ICD-10-CM | POA: Diagnosis not present

## 2021-12-27 DIAGNOSIS — Z79899 Other long term (current) drug therapy: Secondary | ICD-10-CM | POA: Diagnosis not present

## 2021-12-27 DIAGNOSIS — C786 Secondary malignant neoplasm of retroperitoneum and peritoneum: Secondary | ICD-10-CM | POA: Diagnosis not present

## 2021-12-27 DIAGNOSIS — C55 Malignant neoplasm of uterus, part unspecified: Secondary | ICD-10-CM | POA: Diagnosis not present

## 2021-12-27 DIAGNOSIS — I081 Rheumatic disorders of both mitral and tricuspid valves: Secondary | ICD-10-CM | POA: Diagnosis not present

## 2021-12-27 DIAGNOSIS — K449 Diaphragmatic hernia without obstruction or gangrene: Secondary | ICD-10-CM | POA: Diagnosis not present

## 2021-12-27 DIAGNOSIS — I10 Essential (primary) hypertension: Secondary | ICD-10-CM

## 2021-12-27 DIAGNOSIS — C7931 Secondary malignant neoplasm of brain: Secondary | ICD-10-CM | POA: Diagnosis not present

## 2021-12-27 DIAGNOSIS — C7971 Secondary malignant neoplasm of right adrenal gland: Secondary | ICD-10-CM | POA: Diagnosis not present

## 2021-12-27 DIAGNOSIS — Z9221 Personal history of antineoplastic chemotherapy: Secondary | ICD-10-CM | POA: Diagnosis not present

## 2021-12-27 MED ORDER — NYSTATIN 100000 UNIT/ML MT SUSP
OROMUCOSAL | 0 refills | Status: AC
  Filled 2021-12-27: qty 240, 12d supply, fill #0

## 2021-12-27 NOTE — Assessment & Plan Note (Signed)
I have reviewed her CT imaging which show evidence of disease progression She also developed mild cardiomyopathy from recent chemotherapy, cardiology consult is pending Overall, her prognosis is poor She has progressed on 5 different lines of treatment and she has significant metastatic disease to multiple sites Future treatment is strictly palliative in nature and the chance of benefit of any other treatment is less than 20% She is frail Overall, I do not recommend further palliative chemotherapy I recommend we focus on palliative care at this point and she is in agreement I will send referral for home-based palliative care/hospice

## 2021-12-27 NOTE — Assessment & Plan Note (Signed)
Her blood pressure at home is borderline elevated She will continue her current prescribed antihypertensives I anticipate her blood pressure might start to come down with discontinuation of treatment Home-based palliative care/hospice will follow on this

## 2021-12-27 NOTE — Progress Notes (Signed)
[facility] OFF[facility] PROGRESS NOTE  Patient Care Team: [name], MD as PCP - General (Hematology and Oncology)  ASSESSMENT & PLAN:  Uterine cancer [facility]) I have reviewed her CT imaging which show evidence of disease progression She also developed mild cardiomyopathy from recent chemotherapy, cardiology consult is pending Overall, her prognosis is poor She has progressed on 5 different lines of treatment and she has significant metastatic disease to multiple sites Future treatment is strictly palliative in nature and the chance of benefit of any other treatment is less than 20% She is frail Overall, I do not recommend further palliative chemotherapy I recommend we focus on palliative care at this point and she is in agreement I will send referral for home-based palliative care/hospice  Goals of care, counseling/discussion We had numerous goals of care discussions in the past She has advanced directive and living will We discussed CODE STATUS and she agreed for DO NOT RESUSCITATE We discussed prognosis likely to be less than 6 months  Essential hypertension Her blood pressure at home is borderline elevated She will continue her current prescribed antihypertensives I anticipate her blood pressure might start to come down with discontinuation of treatment Home-based palliative care/hospice will follow on this  Orders Placed This Encounter  Procedures   Ambulatory referral to Hospice    Referral Priority:   Routine    Referral Type:   Consultation    Referral Reason:   Specialty Services Required    Requested Specialty:   Hospice Services    Number of Visits Requested:   1    All questions were answered. The patient knows to call the clinic with any problems, questions or concerns. The total time spent in the appointment was 40 minutes encounter with patients including review of chart and various tests results, discussions about plan of care and coordination of care  plan   [name], MD 12/27/2021 10:14 AM  INTERVAL HISTORY: Please see below for problem oriented charting. she returns for treatment follow-up and review of test results with her daughter She feels fine Denies abdominal pain, bloating or changes in bowel habits She is informed of test results and appears somewhat disappointed to see changes suggestive of progression of disease She has no signs or symptoms of heart failure such as shortness of breath, leg edema or dizziness She brought with her documented blood pressure from home which I have reviewed.  The highest systolic blood pressure is around 150 but majority of her blood pressure is under 130 REVIEW OF SYSTEMS:   Constitutional: Denies fevers, chills or abnormal weight loss Eyes: Denies blurriness of vision Ears, nose, mouth, throat, and face: Denies mucositis or sore throat Respiratory: Denies cough, dyspnea or wheezes Cardiovascular: Denies palpitation, chest discomfort or lower extremity swelling Gastrointestinal:  Denies nausea, heartburn or change in bowel habits Skin: Denies abnormal skin rashes Lymphatics: Denies new lymphadenopathy or easy bruising Neurological:Denies numbness, tingling or new weaknesses Behavioral/Psych: Mood is stable, no new changes  All other systems were reviewed with the patient and are negative.  I have reviewed the past medical history, past surgical history, social history and family history with the patient and they are unchanged from previous note.  ALLERGIES:  has No Known Allergies.  MEDICATIONS:  Current Outpatient Medications  Medication Sig Dispense Refill   atenolol (TENORMIN) 50 MG tablet Take 0.5 tablets (25 mg total) by mouth 2 (two) times daily. 90 tablet 0   cholecalciferol (VITAMIN D3) 25 MCG (1000 UNIT) tablet Take  1,000 Units by mouth daily.     doxazosin (CARDURA) 4 MG tablet TAKE 1 TABLET(4 MG) BY MOUTH DAILY 30 tablet 3   hydrALAZINE (APRESOLINE) 25 MG tablet Take 25 mg  by mouth daily.     lidocaine-prilocaine (EMLA) cream Apply 1 application topically daily as needed. 30 g 3   LORazepam (ATIVAN) 0.5 MG tablet TAKE 1 TABLET(0.5 MG) BY MOUTH TWICE DAILY AS NEEDED FOR ANXIETY (Patient taking differently: Take 0.5 mg by mouth 2 (two) times daily as needed for anxiety.) 60 tablet 0   losartan (COZAAR) 25 MG tablet Take 1 tablet (25 mg total) by mouth daily. 30 tablet 1   magic mouthwash w/lidocaine SOLN Take 5 mLs by mouth 4 (four) times daily. 5 ml daily, swish and spit 240 mL 0   ondansetron (ZOFRAN) 8 MG tablet Take 1 tablet (8 mg total) by mouth every 8 (eight) hours as needed for nausea. 30 tablet 3   prochlorperazine (COMPAZINE) 10 MG tablet Take 1 tablet (10 mg total) by mouth every 6 (six) hours as needed for nausea or vomiting. 30 tablet 1   No current facility-administered medications for this visit.    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 Pelvic mass measuring 7.5 x 4.8 cm, contiguous with the sigmoid colon and uterus with loss of fat planes. Site of origin is unclear, may be ovarian, uterine, or colonic. Lack of contrast limits detailed assessment. 2. Above pelvic mass causes partial obstruction of the left ureter with mild left hydroureteronephrosis. 3. Multifocal adenopathy in the abdomen and pelvis, suspicious for metastatic disease, primarily in the mesentery and lower peritoneum and pelvis. Please note that detailed assessment of adenopathy is limited on this noncontrast exam. Recommend oncology referral.  4. Wall thickening of the sigmoid colon distal to the left pelvic mass with pericolonic edema, suspicious for colitis. There is a moderate to large amount of stool in the more proximal colon, suggesting pelvic mass may be causing delayed colonic transit. 5.  Small to moderate hiatal hernia.   08/28/2020 - 09/04/2020 Hospital Admission   The patient has been complaining of reduced urine output, loss of appetite and changes in bowel habits She had abnormal imaging study leading to hospitalization, colonoscopy, biopsy and subsequently chemotherapy   08/30/2020 Procedure   CT-guided biopsy of enlarged left external iliac chain lymph node as above   08/30/2020 Pathology Results   SURGICAL PATHOLOGY  CASE: [id number]  PATIENT: Deliah Sand  Surgical Pathology Report  Clinical History: Pelvic mass with metastatic lymph adenopathy (crm)  FINAL MICROSCOPIC DIAGNOSIS:   A. LYMPH NODE, LEFT EXTERNAL ILIAC CHAIN, BIOPSY:  - Poorly differentiated carcinoma with necrosis.  - See comment.   COMMENT:  The carcinoma is characterized by diffuse sheets of malignant epithelioid cells with prominent nucleoli and frequent mitotic figures. Immunohistochemistry is positive with cytokeratin 7 and PAX 8 with weak  positivity with estrogen receptor and WT1.  P53 shows diffuse strong nuclear positivity.  The carcinoma is negative with cytokeratin 20, CDX2, GATA3, GCDFP, TTF-1, HepPar 1, arginase 1 and CD10.  The  morphology and immunophenotype are most consistent with a gynecologic primary including high-grade serous carcinoma.    08/31/2020 Procedure   Colonoscopy - Preparation of the colon was fair. - Likely malignant partially obstructing tumor in the sigmoid colon (appears extrinsic process). Biopsied. Tattooed. -  Internal hemorrhoids. - Stool in the entire examined colon   09/01/2020 Initial Diagnosis   Uterine cancer ([place])   09/01/2020 Procedure   Successful placement of a power injectable Port-A-Cath via the right internal jugular vein. The catheter is ready for immediate use.   09/04/2020 - 12/29/2020 Chemotherapy    Patient is on Treatment Plan: UTERINE CARBOPLATIN AUC 6 / PACLITAXEL Q21D       11/15/2020 Imaging   1. Marked interval response to  therapy with decrease in size of the mass extending from the uterus to the colon that was seen on the previous examination. Also with decreased size and or resolution of pelvic, mesenteric and retroperitoneal adenopathy seen on previous imaging. 2. Tethering of the colon with area that raise the question of developing colovaginal fistula versus internal enhancement within the mass that is now decreased in size. Attention on follow-up is suggested. Correlate with any current signs of vaginal drainage. 3. LEFT thoracic inlet adenopathy compatible with involvement in the chest. Area not imaged on previous imaging.   02/18/2021 Imaging   1. Interval progression of left supraclavicular,, left thoracic inlet, left subpectoral, retroperitoneal, mesenteric and pelvic sidewall lymphadenopathy. Metastatic deposits in the central pelvis are also progressive. 2. Interval progression of left subpectoral lymphadenopathy with thrombus now visible in the left subclavian/innominate vein. 3. Aortic Atherosclerosis (ICD10-I70.0).   02/23/2021 - 06/15/2021 Chemotherapy   Patient is on Treatment Plan : UTERINE Lenvatinib + Pembrolizumab q21d     05/23/2021 Imaging   1. Decreased size of pelvic soft tissue lesions. 2. Decreased size of left supraclavicular and left subpectoral lymph nodes. 3. Overall decreased size of abdominal and pelvic lymph nodes; however, a few left-sided pelvic and inguinal lymph nodes have increased in size. 4. New mild compression deformity of the inferior endplate of L3. 5.  Aortic Atherosclerosis (ICD10-I70.0).     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  Operating microscope.,  Microdissection technique. Surgeon: Kristeen Miss First Assistant: Consuella Lose MD    06/01/2021  Pathology Results   SURGICAL PATHOLOGY  CASE: [id number]  PATIENT: Laken Ipock  Surgical Pathology Report    FINAL MICROSCOPIC DIAGNOSIS:   A. BRAIN TUMOR, RIGHT CEREBELLAR, RESECTION:  Poorly differentiated carcinoma with morphology and immunoprofile consistent with metastatic previously diagnosed high-grade serous carcinoma.  Clinical correlation is required.   COMMENT:   Immunohistochemistry with appropriate controls for single antibody is positive for CK7, PAX8, WT1 and p53 and is negative for GFAP, CK20 and ER.    07/05/2021 Cancer Staging   Staging form: Corpus Uteri - Carcinoma and Carcinosarcoma, AJCC 8th Edition - Clinical stage from 07/05/2021: FIGO Stage IVB (rcT4, cN2a, pM1) - Signed by [name], MD on 07/05/2021 Stage prefix: Recurrence   07/05/2021 Imaging   1. Today's study demonstrates definitive progression of disease, as evidenced by enlarging mass associated with the posterior aspect of the uterine fundus, which is intimately associated with loops of small bowel and sigmoid colon, worsening pelvic lymphadenopathy, possible adrenal metastasis, and multifocal osseous metastases, as detailed above. 2. Additional incidental findings, as above.       07/11/2021 Imaging   MRI brain 1. Postsurgical changes from right posterior fossa tumor resection with new focus of contrast enhancement at the right cerebellar hemisphere surgical cavity margins, concerning for recurrence. 2. Multiple new foci of enhancement along the cerebral sulci concerning  for leptomeningeal spread of disease. 3. A 5 mm new lesion in the right cerebellar hemisphere. 4. Decrease in size of the previously described left frontal and parietal lesions with resolution of punctate contrast enhancement in the left cerebellar hemisphere.   07/12/2021 Echocardiogram    1. Left ventricular ejection fraction, by estimation, is 55 to 60%. The left ventricle has normal function. The left ventricle has no  regional wall motion abnormalities. Left ventricular diastolic parameters are indeterminate. The average left ventricular global longitudinal strain is -17.3 %. The global longitudinal strain is normal.  2. Right ventricular systolic function is normal. The right ventricular size is normal. There is normal pulmonary artery systolic pressure.  3. Left atrial size was mild to moderately dilated.  4. Right atrial size was mildly dilated.  5. The mitral valve is grossly normal. Trivial mitral valve regurgitation. No evidence of mitral stenosis. Moderate mitral annular calcification.  6. The aortic valve is grossly normal. There is moderate calcification of the aortic valve. There is moderate thickening of the aortic valve. Aortic valve regurgitation is not visualized. Mild aortic valve stenosis.  7. The inferior vena cava is normal in size with greater than 50% respiratory variability, suggesting right atrial pressure of 3 mmHg.     07/16/2021 - 11/30/2021 Chemotherapy   Patient is on Treatment Plan : Uterine Liposomal Doxorubicin q28d     09/27/2021 Imaging   1. Interval decrease in size in bilateral iliac and pelvic sidewall lymph nodes. 2. Tethered appearing soft tissue mass associated with the posterior uterine fundus is slightly diminished in size. 3. Interval decrease in size of a nodule of the body and medial limb of the right adrenal gland. 4. Constellation of findings is consistent with treatment response. 5. Multiple unchanged small sclerotic osseous lesions. 6. Trace bilateral pleural effusions, unchanged   10/05/2021 Echocardiogram    1. Compared to 07/12/21, LV function is slightly worse and GLS has declined.  2. Left ventricular ejection fraction, by estimation, is 50 to 55%. The left ventricle has low normal function. The left ventricle has no regional wall motion abnormalities. Left ventricular diastolic parameters are consistent with Grade I diastolic dysfunction (impaired relaxation).  Elevated left atrial pressure. The average left ventricular global longitudinal strain is -7.9 %. The global longitudinal strain is abnormal.  3. Right ventricular systolic function is normal. The right ventricular size is normal.  4. Left atrial size was mildly dilated.  5. The mitral valve is normal in structure. Trivial mitral valve regurgitation. No evidence of mitral stenosis.  6. The aortic valve is tricuspid. Aortic valve regurgitation is not visualized. Aortic valve sclerosis is present, with no evidence of aortic valve stenosis.  7. The inferior vena cava is normal in size with greater than 50% respiratory variability, suggesting right atrial pressure of 3 mmHg.   12/24/2021 Echocardiogram   1. Left ventricular ejection fraction, by estimation, is 45 to 50%. The left ventricle has mildly decreased function. The left ventricle demonstrates global hypokinesis. Left ventricular diastolic parameters are consistent with Grade I diastolic dysfunction (impaired relaxation). Elevated left atrial pressure. The average left ventricular global longitudinal strain is -14.0 %. The global longitudinal strain is abnormal.  2. Right ventricular systolic function is normal. The right ventricular size is normal. There is normal pulmonary artery systolic pressure.  3. The mitral valve is normal in structure. Mild mitral valve regurgitation. No evidence of mitral stenosis.  4. The aortic valve was not well visualized. Aortic valve regurgitation is not visualized. No  aortic stenosis is present.  5. The inferior vena cava is normal in size with greater than 50% respiratory variability, suggesting right atrial pressure of 3 mmHg.     12/26/2021 Imaging   1. Interval progression of metastatic disease. Right adrenal metastasis is significantly increased. Progression of multi station nodal metastases in the retroperitoneum, left mesentery and left pelvis. Enlarging pelvic soft tissue metastasis along the uterine  fundus. 2. Small dependent left pleural effusion, increased. Stable trace dependent right pleural effusion. 3. Small hiatal hernia.     PHYSICAL EXAMINATION: ECOG PERFORMANCE STATUS: 1 - Symptomatic but completely ambulatory  Vitals:   12/27/21 0957  BP: (!) 159/80  Pulse: 85  Resp: 18  Temp: 99.4 F (37.4 C)  SpO2: 99%   Filed Weights   12/27/21 0957  Weight: 111 lb 6.4 oz (50.5 kg)    GENERAL:alert, no distress and comfortable NEURO: alert & oriented x 3 with fluent speech, no focal motor/sensory deficits  LABORATORY DATA:  I have reviewed the data as listed    Component Value Date/Time   NA 136 12/25/2021 0939   K 4.1 12/25/2021 0939   CL 103 12/25/2021 0939   CO2 25 12/25/2021 0939   GLUCOSE 109 (H) 12/25/2021 0939   BUN 24 (H) 12/25/2021 0939   CREATININE 1.29 (H) 12/25/2021 0939   CALCIUM 9.1 12/25/2021 0939   PROT 6.7 12/25/2021 0939   ALBUMIN 3.6 12/25/2021 0939   AST 16 12/25/2021 0939   ALT 9 12/25/2021 0939   ALKPHOS 120 12/25/2021 0939   BILITOT 0.4 12/25/2021 0939   GFRNONAA 43 (L) 12/25/2021 0939    No results found for: "SPEP", "UPEP"  Lab Results  Component Value Date   WBC 5.9 12/25/2021   NEUTROABS 4.1 12/25/2021   HGB 8.3 (L) 12/25/2021   HCT 25.3 (L) 12/25/2021   MCV 73.3 (L) 12/25/2021   PLT 316 12/25/2021      Chemistry      Component Value Date/Time   NA 136 12/25/2021 0939   K 4.1 12/25/2021 0939   CL 103 12/25/2021 0939   CO2 25 12/25/2021 0939   BUN 24 (H) 12/25/2021 0939   CREATININE 1.29 (H) 12/25/2021 0939      Component Value Date/Time   CALCIUM 9.1 12/25/2021 0939   ALKPHOS 120 12/25/2021 0939   AST 16 12/25/2021 0939   ALT 9 12/25/2021 0939   BILITOT 0.4 12/25/2021 0939       RADIOGRAPHIC STUDIES: I have personally reviewed the radiological images as listed and agreed with the findings in the report. CT ABDOMEN PELVIS W CONTRAST  Result Date: 12/25/2021 CLINICAL DATA:  Uterine cancer diagnosed in 2022  with brain metastases, status post chemotherapy completed 1 week prior. Restaging. * Tracking Code: BO * EXAM: CT ABDOMEN AND PELVIS WITH CONTRAST TECHNIQUE: Multidetector CT imaging of the abdomen and pelvis was performed using the standard protocol following bolus administration of intravenous contrast. RADIATION DOSE REDUCTION: This exam was performed according to the departmental dose-optimization program which includes automated exposure control, adjustment of the mA and/or kV according to patient size and/or use of iterative reconstruction technique. CONTRAST:  9mL OMNIPAQUE IOHEXOL 300 MG/ML  SOLN COMPARISON:  09/25/2021 CT chest, abdomen and pelvis. FINDINGS: Lower chest: No significant pulmonary nodules or acute consolidative airspace disease. Trace dependent right pleural effusion, stable. Small dependent left pleural effusion, increased. Hepatobiliary: Normal liver size. No liver mass. Normal gallbladder with no radiopaque cholelithiasis. No biliary ductal dilatation. Pancreas: Normal, with no mass or  duct dilation. Spleen: Normal size. No mass. Adrenals/Urinary Tract: Heterogeneous hypodense 4.3 x 2.2 cm right adrenal mass, significantly increased from 2.3 x 1.3 cm using similar measurement technique. No discrete left adrenal nodule. No hydronephrosis. Scattered subcentimeter hypodense bilateral renal cortical lesions are too small to characterize and are unchanged, for which no follow-up imaging is recommended. Normal bladder. Stomach/Bowel: Small hiatal hernia. Otherwise normal nondistended stomach. Normal caliber small bowel with no small bowel wall thickening. Normal appendix. Oral contrast transits to the large bowel. Normal large bowel with no diverticulosis, large bowel wall thickening or pericolonic fat stranding. Vascular/Lymphatic: Normal caliber abdominal aorta. Patent portal, splenic, hepatic and renal veins. Newly mildly prominent 0.8 cm gastrohepatic ligament node (series 2/image 15). New  aortocaval adenopathy up to 1.0 cm (series 2/image 31). New left common iliac adenopathy up to 1.5 cm (series 2/image 48). Increased left external iliac lymphadenopathy up to 2.3 cm (series 2/image 58), increased from 1.2 cm. Newly enlarged 1.2 cm left mesenteric node (series 2/image 36). Reproductive: Indistinct 3.8 x 2.1 cm soft tissue mass along the fundal uterus (series 2/image 59), increased from 2.6 x 1.5 cm using similar measurement technique. Otherwise stable small uterus. Other: No pneumoperitoneum, ascites or focal fluid collection. Musculoskeletal: No aggressive appearing focal osseous lesions. Mild-to-moderate thoracolumbar spondylosis. IMPRESSION: 1. Interval progression of metastatic disease. Right adrenal metastasis is significantly increased. Progression of multistation nodal metastases in the retroperitoneum, left mesentery and left pelvis. Enlarging pelvic soft tissue metastasis along the uterine fundus. 2. Small dependent left pleural effusion, increased. Stable trace dependent right pleural effusion. 3. Small hiatal hernia. Electronically Signed   By: Ilona Sorrel M.D.   On: 12/25/2021 18:24   ECHOCARDIOGRAM COMPLETE  Result Date: 12/24/2021    ECHOCARDIOGRAM REPORT   Patient Name:   KHADEEJAH CASTNER Date of Exam: 12/24/2021 Medical Rec #:  [id number]       Height:       59.0 in Accession #:    [id number]      Weight:       110.8 lb Date of Birth:  1945/07/08        BSA:          1.435 m Patient Age:    76 years        BP:           190/114 mmHg Patient Gender: F               HR:           77 bpm. Exam Location:  Outpatient Procedure: 2D Echo, 3D Echo, Cardiac Doppler, Color Doppler and Strain Analysis Indications:    Z51.11 Encounter for antineoplastic chemotheraphy  History:        Patient has prior history of Echocardiogram examinations, most                 recent 10/04/2021. Risk Factors:Hypertension. Metastatic brain                 cancer.  Sonographer:    Roseanna Rainbow RDCS Referring Phys:  [id number] Marino Rogerson [facility]  Sonographer Comments: Suboptimal parasternal window. Global longitudinal strain was attempted. Patient extremely anxious. IMPRESSIONS  1. Left ventricular ejection fraction, by estimation, is 45 to 50%. The left ventricle has mildly decreased function. The left ventricle demonstrates global hypokinesis. Left ventricular diastolic parameters are consistent with Grade I diastolic dysfunction (impaired relaxation). Elevated left atrial pressure. The average left ventricular global longitudinal strain is -14.0 %. The global longitudinal strain  is abnormal.  2. Right ventricular systolic function is normal. The right ventricular size is normal. There is normal pulmonary artery systolic pressure.  3. The mitral valve is normal in structure. Mild mitral valve regurgitation. No evidence of mitral stenosis.  4. The aortic valve was not well visualized. Aortic valve regurgitation is not visualized. No aortic stenosis is present.  5. The inferior vena cava is normal in size with greater than 50% respiratory variability, suggesting right atrial pressure of 3 mmHg. Comparison(s): Prior images reviewed side by side. Compared to pre-chemotherapy imaging earlier this year, LVEF and GLS have both worsened. Conclusion(s)/Recommendation(s): In patients with history of doxyrubicin based therapy, transition to carvedilol may have benefit. FINDINGS  Left Ventricle: Left ventricular ejection fraction, by estimation, is 45 to 50%. The left ventricle has mildly decreased function. The left ventricle demonstrates global hypokinesis. The average left ventricular global longitudinal strain is -14.0 %. The global longitudinal strain is abnormal. The left ventricular internal cavity size was normal in size. There is no left ventricular hypertrophy. Left ventricular diastolic parameters are consistent with Grade I diastolic dysfunction (impaired relaxation). Elevated left atrial pressure. Right Ventricle: The right ventricular  size is normal. No increase in right ventricular wall thickness. Right ventricular systolic function is normal. There is normal pulmonary artery systolic pressure. The tricuspid regurgitant velocity is 2.39 m/s, and  with an assumed right atrial pressure of 3 mmHg, the estimated right ventricular systolic pressure is 38.7 mmHg. Left Atrium: Left atrial size was normal in size. Right Atrium: Right atrial size was normal in size. Pericardium: There is no evidence of pericardial effusion. Mitral Valve: The mitral valve is normal in structure. Mild mitral valve regurgitation. No evidence of mitral valve stenosis. MV peak gradient, 6.8 mmHg. The mean mitral valve gradient is 2.0 mmHg. Tricuspid Valve: The tricuspid valve is normal in structure. Tricuspid valve regurgitation is mild . No evidence of tricuspid stenosis. Aortic Valve: The aortic valve was not well visualized. There is mild aortic valve annular calcification. Aortic valve regurgitation is not visualized. No aortic stenosis is present. Pulmonic Valve: The pulmonic valve was not well visualized. Pulmonic valve regurgitation is not visualized. No evidence of pulmonic stenosis. Aorta: The aortic root and ascending aorta are structurally normal, with no evidence of dilitation. Venous: The inferior vena cava is normal in size with greater than 50% respiratory variability, suggesting right atrial pressure of 3 mmHg. IAS/Shunts: No atrial level shunt detected by color flow Doppler.  LEFT VENTRICLE PLAX 2D LVIDd:         4.00 cm     Diastology LVIDs:         3.00 cm     LV e' medial:    3.50 cm/s LV PW:         0.80 cm     LV E/e' medial:  18.8 LV IVS:        0.90 cm     LV e' lateral:   3.99 cm/s LVOT diam:     1.90 cm     LV E/e' lateral: 16.5 LV SV:         54 LV SV Index:   37          2D Longitudinal Strain LVOT Area:     2.84 cm    2D Strain GLS Avg:     -14.0 %  LV Volumes (MOD) LV vol d, MOD A2C: 67.5 ml 3D Volume EF: LV vol d, MOD A4C: 90.6 ml 3D EF:  51 % LV vol s, MOD A2C: 41.9 ml LV EDV:       115 ml LV vol s, MOD A4C: 52.2 ml LV ESV:       56 ml LV SV MOD A2C:     25.6 ml LV SV:        59 ml LV SV MOD A4C:     90.6 ml LV SV MOD BP:      31.6 ml RIGHT VENTRICLE             IVC RV S prime:     10.68 cm/s  IVC diam: 1.30 cm TAPSE (M-mode): 2.0 cm LEFT ATRIUM             Index        RIGHT ATRIUM           Index LA diam:        2.90 cm 2.02 cm/m   RA Area:     10.20 cm LA Vol (A2C):   41.8 ml 29.13 ml/m  RA Volume:   22.70 ml  15.82 ml/m LA Vol (A4C):   36.2 ml 25.26 ml/m LA Biplane Vol: 41.4 ml 28.85 ml/m  AORTIC VALVE LVOT Vmax:   90.40 cm/s LVOT Vmean:  62.600 cm/s LVOT VTI:    0.189 m  AORTA Ao Root diam: 3.10 cm MITRAL VALVE                TRICUSPID VALVE MV Area (PHT): 4.18 cm     TR Peak grad:   22.8 mmHg MV Area VTI:   1.85 cm     TR Vmax:        239.00 cm/s MV Peak grad:  6.8 mmHg MV Mean grad:  2.0 mmHg     SHUNTS MV Vmax:       1.30 m/s     Systemic VTI:  0.19 m MV Vmean:      67.1 cm/s    Systemic Diam: 1.90 cm MV Decel Time: 182 msec MV E velocity: 65.65 cm/s MV A velocity: 124.00 cm/s MV E/A ratio:  0.53 Rudean Haskell MD Electronically signed by Rudean Haskell MD Signature Date/Time: 12/24/2021/12:36:20 PM    Final

## 2021-12-27 NOTE — Telephone Encounter (Signed)
Called referral to [facility]. Spoke with Ebony Hail. She will call Klar[facility] to scheduled a time to admit to services.

## 2021-12-27 NOTE — Telephone Encounter (Signed)
-----   Message from [name], MD sent at 12/27/2021 10:15 AM EDT ----- Pls call [facility] for referral I sent referral

## 2021-12-27 NOTE — Telephone Encounter (Signed)
Ebony Hail called and left a message. Teresa Oliver does not want hospice to come out until the end of next week. She is still trying to process everything. Ebony Hail will call the office back if anything changes.

## 2021-12-27 NOTE — Assessment & Plan Note (Signed)
We had numerous goals of care discussions in the past She has advanced directive and living will We discussed CODE STATUS and she agreed for DO NOT RESUSCITATE We discussed prognosis likely to be less than 6 months

## 2021-12-28 ENCOUNTER — Other Ambulatory Visit (HOSPITAL_COMMUNITY): Payer: Self-pay

## 2022-01-01 ENCOUNTER — Other Ambulatory Visit (HOSPITAL_COMMUNITY): Payer: Self-pay

## 2022-01-10 ENCOUNTER — Other Ambulatory Visit (HOSPITAL_COMMUNITY): Payer: Self-pay

## 2022-02-12 DIAGNOSIS — R404 Transient alteration of awareness: Secondary | ICD-10-CM | POA: Diagnosis not present

## 2022-02-12 DIAGNOSIS — R1111 Vomiting without nausea: Secondary | ICD-10-CM | POA: Diagnosis not present

## 2022-02-12 DIAGNOSIS — R55 Syncope and collapse: Secondary | ICD-10-CM | POA: Diagnosis not present

## 2022-02-12 DIAGNOSIS — Z743 Need for continuous supervision: Secondary | ICD-10-CM | POA: Diagnosis not present

## 2022-02-17 DIAGNOSIS — 419620001 Death: Secondary | SNOMED CT | POA: Diagnosis not present

## 2022-02-17 DEATH — deceased

## 2022-04-23 ENCOUNTER — Encounter: Payer: Self-pay | Admitting: Radiation Therapy
